# Patient Record
Sex: Male | Born: 1955 | Race: White | Hispanic: No | Smoking: Current some day smoker
Health system: Southern US, Community
[De-identification: ages and names within clinical notes are randomized; demographics above are authoritative.]

## PROBLEM LIST (undated history)

## (undated) ENCOUNTER — Encounter: Attending: Urology | Primary: Urology

## (undated) ENCOUNTER — Ambulatory Visit
Attending: Student in an Organized Health Care Education/Training Program | Primary: Student in an Organized Health Care Education/Training Program

## (undated) ENCOUNTER — Telehealth: Attending: Clinical | Primary: Clinical

## (undated) ENCOUNTER — Encounter

## (undated) ENCOUNTER — Ambulatory Visit

## (undated) ENCOUNTER — Encounter: Attending: Adult Health | Primary: Adult Health

## (undated) ENCOUNTER — Ambulatory Visit: Payer: MEDICARE

## (undated) ENCOUNTER — Telehealth

## (undated) ENCOUNTER — Telehealth: Attending: Addiction (Substance Use Disorder) | Primary: Addiction (Substance Use Disorder)

## (undated) ENCOUNTER — Telehealth
Attending: Student in an Organized Health Care Education/Training Program | Primary: Student in an Organized Health Care Education/Training Program

## (undated) ENCOUNTER — Ambulatory Visit: Payer: MEDICARE | Attending: Addiction (Substance Use Disorder) | Primary: Addiction (Substance Use Disorder)

## (undated) ENCOUNTER — Non-Acute Institutional Stay: Payer: MEDICARE | Attending: Internal Medicine | Primary: Internal Medicine

## (undated) ENCOUNTER — Telehealth: Attending: Urology | Primary: Urology

## (undated) ENCOUNTER — Encounter: Attending: Anesthesiology | Primary: Anesthesiology

## (undated) ENCOUNTER — Telehealth: Attending: Family Medicine | Primary: Family Medicine

## (undated) ENCOUNTER — Encounter: Payer: MEDICARE | Attending: Urology | Primary: Urology

## (undated) ENCOUNTER — Ambulatory Visit: Payer: PRIVATE HEALTH INSURANCE | Attending: Adult Health | Primary: Adult Health

## (undated) ENCOUNTER — Telehealth: Attending: Pharmacist | Primary: Pharmacist

## (undated) ENCOUNTER — Ambulatory Visit: Attending: Orthopaedic Trauma | Primary: Orthopaedic Trauma

## (undated) ENCOUNTER — Non-Acute Institutional Stay: Payer: MEDICARE

## (undated) ENCOUNTER — Telehealth: Attending: Sports Medicine | Primary: Sports Medicine

## (undated) ENCOUNTER — Inpatient Hospital Stay

## (undated) ENCOUNTER — Encounter: Attending: Clinical | Primary: Clinical

## (undated) ENCOUNTER — Ambulatory Visit: Attending: Urology | Primary: Urology

## (undated) ENCOUNTER — Ambulatory Visit: Attending: Clinical | Primary: Clinical

## (undated) ENCOUNTER — Encounter
Payer: MEDICARE | Attending: Student in an Organized Health Care Education/Training Program | Primary: Student in an Organized Health Care Education/Training Program

## (undated) ENCOUNTER — Encounter: Payer: MEDICARE | Attending: Addiction (Substance Use Disorder) | Primary: Addiction (Substance Use Disorder)

## (undated) ENCOUNTER — Encounter: Attending: Family Medicine | Primary: Family Medicine

## (undated) ENCOUNTER — Encounter: Attending: Sports Medicine | Primary: Sports Medicine

## (undated) DIAGNOSIS — F329 Major depressive disorder, single episode, unspecified: Secondary | ICD-10-CM

## (undated) DIAGNOSIS — E785 Hyperlipidemia, unspecified: Secondary | ICD-10-CM

## (undated) DIAGNOSIS — M869 Osteomyelitis, unspecified: Secondary | ICD-10-CM

## (undated) DIAGNOSIS — I700 Atherosclerosis of aorta: Secondary | ICD-10-CM

## (undated) DIAGNOSIS — I2510 Atherosclerotic heart disease of native coronary artery without angina pectoris: Secondary | ICD-10-CM

## (undated) DIAGNOSIS — R339 Retention of urine, unspecified: Secondary | ICD-10-CM

## (undated) DIAGNOSIS — R161 Splenomegaly, not elsewhere classified: Secondary | ICD-10-CM

## (undated) DIAGNOSIS — I10 Essential (primary) hypertension: Secondary | ICD-10-CM

## (undated) DIAGNOSIS — J449 Chronic obstructive pulmonary disease, unspecified: Secondary | ICD-10-CM

## (undated) DIAGNOSIS — B1920 Unspecified viral hepatitis C without hepatic coma: Secondary | ICD-10-CM

## (undated) DIAGNOSIS — F32A Depression, unspecified: Secondary | ICD-10-CM

## (undated) DIAGNOSIS — K279 Peptic ulcer, site unspecified, unspecified as acute or chronic, without hemorrhage or perforation: Secondary | ICD-10-CM

## (undated) DIAGNOSIS — D649 Anemia, unspecified: Secondary | ICD-10-CM

## (undated) DIAGNOSIS — I5042 Chronic combined systolic (congestive) and diastolic (congestive) heart failure: Secondary | ICD-10-CM

## (undated) HISTORY — PX: ANKLE SURGERY: SHX546

## (undated) HISTORY — PX: TEE WITHOUT CARDIOVERSION: SHX5443

## (undated) MED ORDER — ASPIRIN 325 MG TABLET: Freq: Every day | ORAL | 0 days

## (undated) MED ORDER — TAMSULOSIN 0.4 MG CAPSULE: 0 mg | capsule | Freq: Every day | 2 refills | 90 days

## (undated) MED ORDER — ATROVENT HFA 17 MCG/ACTUATION AEROSOL INHALER
0 days
Start: ? — End: 2021-01-24

---

## 2004-11-05 HISTORY — PX: CORONARY ANGIOPLASTY: SHX604

## 2005-11-19 ENCOUNTER — Ambulatory Visit: Payer: Self-pay | Admitting: Internal Medicine

## 2007-01-04 ENCOUNTER — Emergency Department: Payer: Self-pay | Admitting: Emergency Medicine

## 2008-03-08 ENCOUNTER — Ambulatory Visit: Payer: Self-pay | Admitting: General Practice

## 2011-11-06 HISTORY — PX: ANKLE SURGERY: SHX546

## 2012-09-09 ENCOUNTER — Ambulatory Visit: Payer: Self-pay | Admitting: Internal Medicine

## 2014-01-21 ENCOUNTER — Emergency Department: Payer: Self-pay | Admitting: Internal Medicine

## 2014-01-21 LAB — CBC
HCT: 46.8 % (ref 40.0–52.0)
HGB: 15.8 g/dL (ref 13.0–18.0)
MCH: 32.5 pg (ref 26.0–34.0)
MCHC: 33.8 g/dL (ref 32.0–36.0)
MCV: 96 fL (ref 80–100)
Platelet: 288 10*3/uL (ref 150–440)
RBC: 4.87 10*6/uL (ref 4.40–5.90)
RDW: 13 % (ref 11.5–14.5)
WBC: 10.7 10*3/uL — ABNORMAL HIGH (ref 3.8–10.6)

## 2014-01-21 LAB — BASIC METABOLIC PANEL
ANION GAP: 4 — AB (ref 7–16)
BUN: 13 mg/dL (ref 7–18)
CO2: 26 mmol/L (ref 21–32)
Calcium, Total: 9 mg/dL (ref 8.5–10.1)
Chloride: 107 mmol/L (ref 98–107)
Creatinine: 1.01 mg/dL (ref 0.60–1.30)
EGFR (African American): >60
GLUCOSE: 110 mg/dL — AB (ref 65–99)
Osmolality: 275 (ref 275–301)
POTASSIUM: 3.7 mmol/L (ref 3.5–5.1)
Sodium: 137 mmol/L (ref 136–145)

## 2015-03-03 ENCOUNTER — Emergency Department
Admit: 2015-03-03 | Disposition: A | Discharge status: Home / Self Care | Payer: Self-pay | Admitting: Emergency Medicine

## 2015-03-07 LAB — BETA STREP CULTURE(ARMC)

## 2015-10-31 ENCOUNTER — Emergency Department
Admission: EM | Admit: 2015-10-31 | Discharge: 2015-10-31 | Disposition: A | Discharge status: Home / Self Care | Payer: Self-pay | Attending: Emergency Medicine | Admitting: Emergency Medicine

## 2015-10-31 ENCOUNTER — Emergency Department: Admission: EM | Admit: 2015-10-31 | Discharge: 2015-10-31 | Discharge status: Absent without leave | Payer: Self-pay

## 2015-10-31 ENCOUNTER — Emergency Department: Payer: Self-pay

## 2015-10-31 DIAGNOSIS — F17200 Nicotine dependence, unspecified, uncomplicated: Secondary | ICD-10-CM | POA: Insufficient documentation

## 2015-10-31 DIAGNOSIS — I10 Essential (primary) hypertension: Secondary | ICD-10-CM | POA: Insufficient documentation

## 2015-10-31 DIAGNOSIS — L03115 Cellulitis of right lower limb: Secondary | ICD-10-CM | POA: Insufficient documentation

## 2015-10-31 HISTORY — DX: Essential (primary) hypertension: I10

## 2015-10-31 MED ORDER — SULFAMETHOXAZOLE-TRIMETHOPRIM 800-160 MG PO TABS: 1.0000 | ORAL_TABLET | Freq: Two times a day (BID) | ORAL | Status: DC

## 2015-10-31 MED ORDER — OXYCODONE-ACETAMINOPHEN 7.5-325 MG PO TABS: 1.0000 | ORAL_TABLET | ORAL | Status: DC | PRN

## 2015-10-31 NOTE — ED Notes (Addendum)
Pt arrives POV c/o right ankle pain and swelling. Pt hx of metal plates in right ankle per patient and he states that it is normally swollen mildly. Pt states that since Friday his right ankle has been more red and swollen than his baseline. Palpable pulse moderate. Edema 1+. Pt alert and oriented X4, active, cooperative, pt in NAD. RR even and unlabored, color WNL.  Pt ambulatory. No breaks in skin.

## 2015-10-31 NOTE — Discharge Instructions (Signed)
Cellulitis °Cellulitis is an infection of the skin and the tissue under the skin. The infected area is usually red and tender. This happens most often in the arms and lower legs. °HOME CARE  °· Take your antibiotic medicine as told. Finish the medicine even if you start to feel better. °· Keep the infected arm or leg raised (elevated). °· Put a warm cloth on the area up to 4 times per day. °· Only take medicines as told by your doctor. °· Keep all doctor visits as told. °GET HELP IF: °· You see red streaks on the skin coming from the infected area. °· Your red area gets bigger or turns a dark color. °· Your bone or joint under the infected area is painful after the skin heals. °· Your infection comes back in the same area or different area. °· You have a puffy (swollen) bump in the infected area. °· You have new symptoms. °· You have a fever. °GET HELP RIGHT AWAY IF:  °· You feel very sleepy. °· You throw up (vomit) or have watery poop (diarrhea). °· You feel sick and have muscle aches and pains. °  °This information is not intended to replace advice given to you by your health care provider. Make sure you discuss any questions you have with your health care provider. °  °Document Released: 04/09/2008 Document Revised: 07/13/2015 Document Reviewed: 01/07/2012 °Elsevier Interactive Patient Education ©2016 Elsevier [employer] ° °

## 2015-10-31 NOTE — ED Notes (Addendum)
Pt in w/ complaints of pain and swelling to right ankle since Friday; pt reports surgery and metal hardware placed in 2013 to right ankle.  Swelling, redness, warmth to right ankle.  Pt in no immediate distress at this time.

## 2015-10-31 NOTE — ED Provider Notes (Signed)
[facility] Emergency Department Provider Note  ____________________________________________  Time seen: Approximately 2:24 PM  I have reviewed the triage vital signs and the nursing notes.   HISTORY  Chief Complaint Ankle Pain    HPI Wayne Burns is a 59 y.o. male patient with a right ankle pain and swollen for 3 days. Patient stated he has internal fixation in the ankle always has a mild amount of edema. Pacer became concerned he knows increased redness around the ankle. He denies any provocative incident to increased swelling. Patient is rating his pain as 8/10 describe the sharp. No palliative measures taken for this complaint.   Past Medical History  Diagnosis Date  . Hypertension     There are no active problems to display for this patient.   Past Surgical History  Procedure Laterality Date  . Ankle surgery      No current outpatient prescriptions on file.  Allergies Review of patient's allergies indicates no known allergies.  No family history on file.  Social History Social History  Substance Use Topics  . Smoking status: Current Every Day Smoker  . Smokeless tobacco: None  . Alcohol Use: No    Review of Systems Constitutional: No fever/chills Eyes: No visual changes. ENT: No sore throat. Cardiovascular: Denies chest pain. Respiratory: Denies shortness of breath. Gastrointestinal: No abdominal pain.  No nausea, no vomiting.  No diarrhea.  No constipation. Genitourinary: Negative for dysuria. Musculoskeletal: Right ankle pain Skin: Negative for rash. Neurological: Negative for headaches, focal weakness or numbness. Endocrine:Hypertension 10-point ROS otherwise negative.  ____________________________________________   PHYSICAL EXAM:  VITAL SIGNS: ED Triage Vitals  Enc Vitals Group     BP 10/31/15 1407 153/87 mmHg     Pulse Rate 10/31/15 1407 87     Resp 10/31/15 1407 20     Temp 10/31/15 1407 97.9 F (36.6 C)   Temp Source 10/31/15 1407 Oral     SpO2 10/31/15 1407 98 %     Weight 10/31/15 1407 200 lb (90.719 kg)     Height 10/31/15 1407 5\' 9"  (1.753 m)     Head Cir --      Peak Flow --      Pain Score 10/31/15 1408 8     Pain Loc --      Pain Edu? --      Excl. in [place]? --     Constitutional: Alert and oriented. Well appearing and in no acute distress. Eyes: Conjunctivae are normal. PERRL. EOMI. Head: Atraumatic. Nose: No congestion/rhinnorhea. Mouth/Throat: Mucous membranes are moist.  Oropharynx non-erythematous. Neck: No stridor.  No cervical spine tenderness to palpation. Hematological/Lymphatic/Immunilogical: No cervical lymphadenopathy. Cardiovascular: Normal rate, regular rhythm. Grossly normal heart sounds.  Good peripheral circulation. Respiratory: Normal respiratory effort.  No retractions. Lungs CTAB. Gastrointestinal: Soft and nontender. No distention. No abdominal bruits. No CVA tenderness. Musculoskeletal: No lower extremity tenderness nor edema.  No joint effusions. Neurologic:  Normal speech and language. No gross focal neurologic deficits are appreciated. No gait instability. Skin:  Skin is edematous and erythematous. Edema measures 62 cm circumference. Psychiatric: Mood and affect are normal. Speech and behavior are normal.  ____________________________________________   LABS (all labs ordered are listed, but only abnormal results are displayed)  Labs Reviewed - No data to display ____________________________________________  EKG   ____________________________________________  RADIOLOGY no acute findings on x-ray except for soft tissue edema. ____________________________________________   PROCEDURES  Procedure(s) performed: None  Critical Care performed: No  ____________________________________________   INITIAL IMPRESSION /  ASSESSMENT AND PLAN / ED COURSE  Pertinent labs & imaging results that were available during my care of the patient were reviewed  by me and considered in my medical decision making (see chart for details). Cellulitis right ankle. Discussed x-ray findings with patient. Patient given a prescription for Bactrim and Percocets. Patient given discharge home care instructions. Patient advised follow-up in 3 days with [facility] clinic. Patient given a work excuse for 3 days.  ____________________________________________   FINAL CLINICAL IMPRESSION(S) / ED DIAGNOSES  Final diagnoses:  None      Sable Feil, PA-C 10/31/15 1513  Lavonia Drafts, MD 11/01/15 [phone]

## 2015-12-17 ENCOUNTER — Encounter: Payer: Self-pay | Admitting: Emergency Medicine

## 2015-12-17 ENCOUNTER — Emergency Department
Admission: EM | Admit: 2015-12-17 | Discharge: 2015-12-17 | Disposition: A | Discharge status: Home / Self Care | Payer: Self-pay | Attending: Emergency Medicine | Admitting: Emergency Medicine

## 2015-12-17 ENCOUNTER — Emergency Department: Payer: Self-pay

## 2015-12-17 DIAGNOSIS — Z792 Long term (current) use of antibiotics: Secondary | ICD-10-CM | POA: Insufficient documentation

## 2015-12-17 DIAGNOSIS — J449 Chronic obstructive pulmonary disease, unspecified: Secondary | ICD-10-CM

## 2015-12-17 DIAGNOSIS — J439 Emphysema, unspecified: Secondary | ICD-10-CM | POA: Insufficient documentation

## 2015-12-17 DIAGNOSIS — I10 Essential (primary) hypertension: Secondary | ICD-10-CM | POA: Insufficient documentation

## 2015-12-17 DIAGNOSIS — F17200 Nicotine dependence, unspecified, uncomplicated: Secondary | ICD-10-CM | POA: Insufficient documentation

## 2015-12-17 MED ORDER — IPRATROPIUM-ALBUTEROL 0.5-2.5 (3) MG/3ML IN SOLN
3.0000 mL | Freq: Once | RESPIRATORY_TRACT | Status: AC
  Administered 2015-12-17: 3 mL via RESPIRATORY_TRACT
  Filled 2015-12-17: qty 3

## 2015-12-17 MED ORDER — ALBUTEROL SULFATE (2.5 MG/3ML) 0.083% IN NEBU: 2.5000 mg | INHALATION_SOLUTION | Freq: Four times a day (QID) | RESPIRATORY_TRACT | Status: DC | PRN

## 2015-12-17 MED ORDER — ALBUTEROL SULFATE HFA 108 (90 BASE) MCG/ACT IN AERS: 2.0000 | INHALATION_SPRAY | Freq: Four times a day (QID) | RESPIRATORY_TRACT | Status: DC | PRN

## 2015-12-17 NOTE — ED Notes (Signed)
Pt reports he is a smoker but has been unable to smoke for the past four days due to feeling short of breath.

## 2015-12-17 NOTE — ED Notes (Signed)
States began feeling bad on Wednesday.    Denies fevers.  Describes generally not feeling well.  Feeling like can't catch breath, cough, wheezing.

## 2015-12-17 NOTE — ED Provider Notes (Signed)
[facility] Emergency Department Provider Note ____________________________________________  Time seen: 1246  I have reviewed the triage vital signs and the nursing notes.  HISTORY  Chief Complaint  Cough  HPI Wayne Burns is a 60 y.o. male presents to the ED for evaluation of symptoms that include generally feeling bad and being significantly short of breath with associated cough and wheezing. He describes the symptoms began on Wednesday, 4 days prior to arrival. He describes his symptoms have been significant enough to force him to not smoke since onset.He denies any intermittent fevers but does admit to decreased appetite. He describes the cough has been nonproductive, and has not responded to the Mucinex he's been dosing over-the-counter. He denies any significant chest pain or abdominal pain, or dyspnea on exertion.  Past Medical History  Diagnosis Date  . Hypertension    There are no active problems to display for this patient.  Past Surgical History  Procedure Laterality Date  . Ankle surgery      Current Outpatient Rx  Name  Route  Sig  Dispense  Refill  . albuterol (PROVENTIL HFA;VENTOLIN HFA) 108 (90 Base) MCG/ACT inhaler   Inhalation   Inhale 2 puffs into the lungs every 6 (six) hours as needed for wheezing or shortness of breath.   1 Inhaler   0   . albuterol (PROVENTIL) (2.5 MG/3ML) 0.083% nebulizer solution   Nebulization   Take 3 mLs (2.5 mg total) by nebulization every 6 (six) hours as needed for wheezing or shortness of breath.   25 vial   0   . oxyCODONE-acetaminophen (PERCOCET) 7.5-325 MG tablet   Oral   Take 1 tablet by mouth every 4 (four) hours as needed for severe pain.   20 tablet   0   . sulfamethoxazole-trimethoprim (BACTRIM DS,SEPTRA DS) 800-160 MG tablet   Oral   Take 1 tablet by mouth 2 (two) times daily.   20 tablet   0    Allergies Review of patient's allergies indicates no known allergies.  No family history  on file.  Social History Social History  Substance Use Topics  . Smoking status: Current Every Day Smoker  . Smokeless tobacco: None  . Alcohol Use: No   Review of Systems  Constitutional: Negative for fever. Eyes: Negative for visual changes. ENT: Negative for sore throat. Cardiovascular: Negative for chest pain. Respiratory: Positive for shortness of breath. Reports cough as above Gastrointestinal: Negative for abdominal pain, vomiting and diarrhea. Genitourinary: Negative for dysuria. Musculoskeletal: Negative for back pain. Skin: Negative for rash. Neurological: Negative for headaches, focal weakness or numbness. ____________________________________________  PHYSICAL EXAM:  VITAL SIGNS: ED Triage Vitals  Enc Vitals Group     BP 12/17/15 1125 140/109 mmHg     Pulse Rate 12/17/15 1125 96     Resp 12/17/15 1125 22     Temp 12/17/15 1125 97.9 F (36.6 C)     Temp Source 12/17/15 1125 Oral     SpO2 12/17/15 1125 95 %     Weight 12/17/15 1125 200 lb (90.719 kg)     Height 12/17/15 1125 5\' 9"  (1.753 m)     Head Cir --      Peak Flow --      Pain Score 12/17/15 1126 0     Pain Loc --      Pain Edu? --      Excl. in [place]? --    Constitutional: Alert and oriented. Well appearing and in no distress. Head: Normocephalic  and atraumatic.      Eyes: Conjunctivae are normal. PERRL. Normal extraocular movements      Ears: Canals clear. TMs intact bilaterally.   Nose: No congestion/rhinorrhea.   Mouth/Throat: Mucous membranes are moist.   Neck: Supple. No thyromegaly. Hematological/Lymphatic/Immunological: No cervical lymphadenopathy. Cardiovascular: Normal rate, regular rhythm.  Respiratory: Normal respiratory effort. No wheezes/rales/rhonchi. Gastrointestinal: Soft and nontender. No distention. Musculoskeletal: Nontender with normal range of motion in all extremities.  Neurologic:  Normal gait without ataxia. Normal speech and language. No gross focal neurologic  deficits are appreciated. Skin:  Skin is warm, dry and intact. No rash noted. Psychiatric: Mood and affect are normal. Patient exhibits appropriate insight and judgment. ____________________________________________   RADIOLOGY CXR IMPRESSION: COPD/emphysema without evidence of acute cardiopulmonary disease.  I, Moani Weipert, Dannielle Karvonen, personally viewed and evaluated these images (plain radiographs) as part of my medical decision making, as well as reviewing the written report by the radiologist.  ____________________________________________  PROCEDURES  DuoNeb x 1 ____________________________________________  INITIAL IMPRESSION / ASSESSMENT AND PLAN / ED COURSE  Patient reports improvement with symptoms following DuoNeb treatment. He is advised of the radiology results and is prescribed albuterol to dose as directed. He is encouraged to follow-up with his primary care provider for ongoing symptom management. He is also encouraged to continue smoking cessation since he now understands that that is a significant underlying risk factor for development of COPD. He is provided with information to follow-up with a local community clinics as well as information on assistance with medications. ____________________________________________  FINAL CLINICAL IMPRESSION(S) / ED DIAGNOSES  Final diagnoses:  Pulmonary emphysema, unspecified emphysema type ([place])  COPD, mild ([place])      Melvenia Needles, PA-C 12/20/15 0021  Eula Listen, MD 12/21/15 1536

## 2015-12-17 NOTE — Discharge Instructions (Signed)
Chronic Obstructive Pulmonary Disease Chronic obstructive pulmonary disease (COPD) is a common lung condition in which airflow from the lungs is limited. COPD is a general term that can be used to describe many different lung problems that limit airflow, including both chronic bronchitis and emphysema. If you have COPD, your lung function will probably never return to normal, but there are measures you can take to improve lung function and make yourself feel better. CAUSES   Smoking (common).  Exposure to secondhand smoke.  Genetic problems.  Chronic inflammatory lung diseases or recurrent infections. SYMPTOMS  Shortness of breath, especially with physical activity.  Deep, persistent (chronic) cough with a large amount of thick mucus.  Wheezing.  Rapid breaths (tachypnea).  Gray or bluish discoloration (cyanosis) of the skin, especially in your fingers, toes, or lips.  Fatigue.  Weight loss.  Frequent infections or episodes when breathing symptoms become much worse (exacerbations).  Chest tightness. DIAGNOSIS Your health care provider will take a medical history and perform a physical examination to diagnose COPD. Additional tests for COPD may include:  Lung (pulmonary) function tests.  Chest X-ray.  CT scan.  Blood tests. TREATMENT  Treatment for COPD may include:  Inhaler and nebulizer medicines. These help manage the symptoms of COPD and make your breathing more comfortable.  Supplemental oxygen. Supplemental oxygen is only helpful if you have a low oxygen level in your blood.  Exercise and physical activity. These are beneficial for nearly all people with COPD.  Lung surgery or transplant.  Nutrition therapy to gain weight, if you are underweight.  Pulmonary rehabilitation. This may involve working with a team of health care providers and specialists, such as respiratory, occupational, and physical therapists. HOME CARE INSTRUCTIONS  Take all medicines  (inhaled or pills) as directed by your health care provider.  Avoid over-the-counter medicines or cough syrups that dry up your airway (such as antihistamines) and slow down the elimination of secretions unless instructed otherwise by your health care provider.  If you are a smoker, the most important thing that you can do is stop smoking. Continuing to smoke will cause further lung damage and breathing trouble. Ask your health care provider for help with quitting smoking. He or she can direct you to community resources or hospitals that provide support.  Avoid exposure to irritants such as smoke, chemicals, and fumes that aggravate your breathing.  Use oxygen therapy and pulmonary rehabilitation if directed by your health care provider. If you require home oxygen therapy, ask your health care provider whether you should purchase a pulse oximeter to measure your oxygen level at home.  Avoid contact with individuals who have a contagious illness.  Avoid extreme temperature and humidity changes.  Eat healthy foods. Eating smaller, more frequent meals and resting before meals may help you maintain your strength.  Stay active, but balance activity with periods of rest. Exercise and physical activity will help you maintain your ability to do things you want to do.  Preventing infection and hospitalization is very important when you have COPD. Make sure to receive all the vaccines your health care provider recommends, especially the pneumococcal and influenza vaccines. Ask your health care provider whether you need a pneumonia vaccine.  Learn and use relaxation techniques to manage stress.  Learn and use controlled breathing techniques as directed by your health care provider. Controlled breathing techniques include:  Pursed lip breathing. Start by breathing in (inhaling) through your nose for 1 second. Then, purse your lips as if you were  going to whistle and breathe out (exhale) through the  pursed lips for 2 seconds.  Diaphragmatic breathing. Start by putting one hand on your abdomen just above your waist. Inhale slowly through your nose. The hand on your abdomen should move out. Then purse your lips and exhale slowly. You should be able to feel the hand on your abdomen moving in as you exhale.  Learn and use controlled coughing to clear mucus from your lungs. Controlled coughing is a series of short, progressive coughs. The steps of controlled coughing are: 1. Lean your head slightly forward. 2. Breathe in deeply using diaphragmatic breathing. 3. Try to hold your breath for 3 seconds. 4. Keep your mouth slightly open while coughing twice. 5. Spit any mucus out into a tissue. 6. Rest and repeat the steps once or twice as needed. SEEK MEDICAL CARE IF:  You are coughing up more mucus than usual.  There is a change in the color or thickness of your mucus.  Your breathing is more labored than usual.  Your breathing is faster than usual. SEEK IMMEDIATE MEDICAL CARE IF:  You have shortness of breath while you are resting.  You have shortness of breath that prevents you from:  Being able to talk.  Performing your usual physical activities.  You have chest pain lasting longer than 5 minutes.  Your skin color is more cyanotic than usual.  You measure low oxygen saturations for longer than 5 minutes with a pulse oximeter. MAKE SURE YOU:  Understand these instructions.  Will watch your condition.  Will get help right away if you are not doing well or get worse.   This information is not intended to replace advice given to you by your health care provider. Make sure you discuss any questions you have with your health care provider.   Document Released: 08/01/2005 Document Revised: 11/12/2014 Document Reviewed: 06/18/2013 Elsevier Interactive Patient Education [employer].  Your exam and chest x-ray have confirmed COPD and emphysema as your diagnosis. You  should dose the prescription inhaler as directed. Follow-up with a local clinic for ongoing management of your COPD and high blood pressure. Return to the ED as needed for worsening symptoms.

## 2016-03-26 ENCOUNTER — Encounter: Payer: Self-pay | Admitting: Emergency Medicine

## 2016-03-26 ENCOUNTER — Emergency Department
Admission: EM | Admit: 2016-03-26 | Discharge: 2016-03-26 | Disposition: A | Discharge status: Home / Self Care | Payer: Self-pay | Attending: Emergency Medicine | Admitting: Emergency Medicine

## 2016-03-26 DIAGNOSIS — L03115 Cellulitis of right lower limb: Secondary | ICD-10-CM | POA: Insufficient documentation

## 2016-03-26 DIAGNOSIS — F17200 Nicotine dependence, unspecified, uncomplicated: Secondary | ICD-10-CM | POA: Insufficient documentation

## 2016-03-26 DIAGNOSIS — L03119 Cellulitis of unspecified part of limb: Secondary | ICD-10-CM

## 2016-03-26 DIAGNOSIS — I10 Essential (primary) hypertension: Secondary | ICD-10-CM | POA: Insufficient documentation

## 2016-03-26 LAB — COMPREHENSIVE METABOLIC PANEL
ALBUMIN: 3.5 g/dL (ref 3.5–5.0)
ALK PHOS: 111 U/L (ref 38–126)
ALT: 65 U/L — AB (ref 17–63)
ANION GAP: 6 (ref 5–15)
AST: 44 U/L — AB (ref 15–41)
BILIRUBIN TOTAL: 0.8 mg/dL (ref 0.3–1.2)
BUN: 12 mg/dL (ref 6–20)
CALCIUM: 9.2 mg/dL (ref 8.9–10.3)
CO2: 25 mmol/L (ref 22–32)
CREATININE: 0.87 mg/dL (ref 0.61–1.24)
Chloride: 106 mmol/L (ref 101–111)
GFR calc Af Amer: >60 mL/min (ref >60)
GFR calc non Af Amer: >60 mL/min (ref >60)
GLUCOSE: 114 mg/dL — AB (ref 65–99)
Potassium: 4.1 mmol/L (ref 3.5–5.1)
Sodium: 137 mmol/L (ref 135–145)
TOTAL PROTEIN: 7.9 g/dL (ref 6.5–8.1)

## 2016-03-26 LAB — CBC
HEMATOCRIT: 46.9 % (ref 40.0–52.0)
HEMOGLOBIN: 15.9 g/dL (ref 13.0–18.0)
MCH: 32.1 pg (ref 26.0–34.0)
MCHC: 33.8 g/dL (ref 32.0–36.0)
MCV: 95 fL (ref 80.0–100.0)
Platelets: 200 10*3/uL (ref 150–440)
RBC: 4.93 MIL/uL (ref 4.40–5.90)
RDW: 14 % (ref 11.5–14.5)
WBC: 10.8 10*3/uL — ABNORMAL HIGH (ref 3.8–10.6)

## 2016-03-26 MED ORDER — OXYCODONE-ACETAMINOPHEN 5-325 MG PO TABS: 1.0000 | ORAL_TABLET | ORAL | Status: DC | PRN

## 2016-03-26 MED ORDER — CEPHALEXIN 500 MG PO CAPS: 500.0000 mg | ORAL_CAPSULE | Freq: Four times a day (QID) | ORAL | Status: AC

## 2016-03-26 NOTE — Discharge Instructions (Signed)

## 2016-03-26 NOTE — ED Notes (Signed)
See down time document

## 2016-03-26 NOTE — ED Notes (Signed)
States he developed increased swelling and redness to right lower leg/ankle area couple of days ago.states he did a lot of walking for his job  And denies new or recent injury

## 2016-03-26 NOTE — ED Provider Notes (Signed)
[facility] Emergency Department Provider Note  ____________________________________________  Time seen: Approximately 2:26 PM  I have reviewed the triage vital signs and the nursing notes.   HISTORY  Chief Complaint Recurrent Skin Infections    HPI Wayne Burns is a 60 y.o. male presents for evaluation of increased swelling and redness to right lower leg and ankle times couple days. States he does a lot of walking for a job denies any new or decent recent injury. Patient does have a past medical history of hardware in his ankle and foot. Desires to have it removed. States this is not the first time today said inflammation and swelling secondary to prolonged standing.   Past Medical History  Diagnosis Date  . Hypertension     There are no active problems to display for this patient.   Past Surgical History  Procedure Laterality Date  . Ankle surgery      Current Outpatient Rx  Name  Route  Sig  Dispense  Refill  . cephALEXin (KEFLEX) 500 MG capsule   Oral   Take 1 capsule (500 mg total) by mouth 4 (four) times daily.   40 capsule   0   . oxyCODONE-acetaminophen (ROXICET) 5-325 MG tablet   Oral   Take 1-2 tablets by mouth every 4 (four) hours as needed for severe pain.   15 tablet   0     Allergies Review of patient's allergies indicates no known allergies.  No family history on file.  Social History Social History  Substance Use Topics  . Smoking status: Current Every Day Smoker  . Smokeless tobacco: None  . Alcohol Use: No    Review of Systems Constitutional: No fever/chills Cardiovascular: Denies chest pain. Respiratory: Denies shortness of breath. Musculoskeletal: Positive for right ankle foot pain. Skin: Right ankle. Positive edema with erythema and warmth.. Neurological: Negative for headaches, focal weakness or numbness.  10-point ROS otherwise negative.  ____________________________________________   PHYSICAL  EXAM:  VITAL SIGNS: ED Triage Vitals  Enc Vitals Group     BP 03/26/16 1306 179/101 mmHg     Pulse Rate 03/26/16 1306 93     Resp 03/26/16 1306 20     Temp 03/26/16 1306 98.5 F (36.9 C)     Temp Source 03/26/16 1306 Oral     SpO2 03/26/16 1306 97 %     Weight 03/26/16 1306 190 lb (86.183 kg)     Height 03/26/16 1306 5\' 9"  (1.753 m)     Head Cir --      Peak Flow --      Pain Score 03/26/16 1307 10     Pain Loc --      Pain Edu? --      Excl. in [place]? --     Constitutional: Alert and oriented. Well appearing and in no acute distress.  Cardiovascular: Normal rate, regular rhythm. Grossly normal heart sounds.  Good peripheral circulation. Respiratory: Normal respiratory effort.  No retractions. Lungs CTAB. Musculoskeletal: Right lower extremity with edema, erythema and effusion. Positive warmth and redness. Neurologic:  Normal speech and language. No gross focal neurologic deficits are appreciated. No gait instability. Skin:  Skin is warm, dry and intact. No rash noted. Psychiatric: Mood and affect are normal. Speech and behavior are normal.  ____________________________________________   LABS (all labs ordered are listed, but only abnormal results are displayed)  Labs Reviewed  CBC - Abnormal; Notable for the following:    WBC 10.8 (*)    All  other components within normal limits  COMPREHENSIVE METABOLIC PANEL - Abnormal; Notable for the following:    Glucose, Bld 114 (*)    AST 44 (*)    ALT 65 (*)    All other components within normal limits   ____________________________________________    PROCEDURES  Procedure(s) performed: None  Critical Care performed: No  ____________________________________________   INITIAL IMPRESSION / ASSESSMENT AND PLAN / ED COURSE  Pertinent labs & imaging results that were available during my care of the patient were reviewed by me and considered in my medical decision making (see chart for details).  Acute exacerbation  cellulitis right lower extremity. Rx given for Keflex 500 mg 4 times a day and oxycodone for pain. Patient follow-up with PCP. Referral was given to orthopedics on call she voices no other emergency medical complaints this time.  ____________________________________________   FINAL CLINICAL IMPRESSION(S) / ED DIAGNOSES  Final diagnoses:  Cellulitis of lower extremity, unspecified laterality     This chart was dictated using voice recognition software/Dragon. Despite best efforts to proofread, errors can occur which can change the meaning. Any change was purely unintentional.   Arlyss Repress, PA-C 03/26/16 1524  Lisa Roca, MD 03/26/16 1606

## 2016-04-28 ENCOUNTER — Emergency Department
Admission: EM | Admit: 2016-04-28 | Discharge: 2016-04-28 | Disposition: A | Discharge status: Home / Self Care | Payer: Self-pay | Attending: Emergency Medicine | Admitting: Emergency Medicine

## 2016-04-28 ENCOUNTER — Encounter: Payer: Self-pay | Admitting: Emergency Medicine

## 2016-04-28 DIAGNOSIS — I10 Essential (primary) hypertension: Secondary | ICD-10-CM | POA: Insufficient documentation

## 2016-04-28 DIAGNOSIS — F17210 Nicotine dependence, cigarettes, uncomplicated: Secondary | ICD-10-CM | POA: Insufficient documentation

## 2016-04-28 DIAGNOSIS — L03115 Cellulitis of right lower limb: Secondary | ICD-10-CM | POA: Insufficient documentation

## 2016-04-28 MED ORDER — SULFAMETHOXAZOLE-TRIMETHOPRIM 800-160 MG PO TABS: 1.0000 | ORAL_TABLET | Freq: Two times a day (BID) | ORAL | Status: DC

## 2016-04-28 MED ORDER — HYDROCODONE-ACETAMINOPHEN 5-325 MG PO TABS: 1.0000 | ORAL_TABLET | ORAL | Status: DC | PRN

## 2016-04-28 MED ORDER — MELOXICAM 15 MG PO TABS: 15.0000 mg | ORAL_TABLET | Freq: Every day | ORAL | Status: DC

## 2016-04-28 NOTE — Discharge Instructions (Signed)

## 2016-04-28 NOTE — ED Notes (Addendum)
States has ankle pain x 2 days. States has history of fracture and pins that ankle 2012. Concerned he may have infection. States has had previous infections which he took po antibiotics for. States has had x ray 3 months ago and was OK and has not had new injury. Ankle does not appear reddened.

## 2016-04-28 NOTE — ED Notes (Signed)
Pt verbalized understanding of discharge instructions. NAD at this time. 

## 2016-04-28 NOTE — ED Provider Notes (Signed)
[facility] Emergency Department Provider Note ____________________________________________  Time seen: Approximately 5:16 PM  I have reviewed the triage vital signs and the nursing notes.   HISTORY  Chief Complaint Ankle Pain    HPI Wayne Burns is a 60 y.o. male who presents to the emergency department for evaluation of right ankle pain and swelling. He states that 2 days ago the ankle began to swell and become painful. He states that since his injury in 2012 that required ORIF, he has had multiple episodes of cellulitis. He states that he was examined several weeks ago, and started on some type of antibiotic, but does not feel that it completely took the infection away. He denies fevers, chills, nausea, vomiting, or recent injury. He has not taken anything to help with pain.  Past Medical History  Diagnosis Date  . Hypertension     There are no active problems to display for this patient.   Past Surgical History  Procedure Laterality Date  . Ankle surgery      Current Outpatient Rx  Name  Route  Sig  Dispense  Refill  . HYDROcodone-acetaminophen (NORCO/VICODIN) 5-325 MG tablet   Oral   Take 1 tablet by mouth every 4 (four) hours as needed for moderate pain.   20 tablet   0   . meloxicam (MOBIC) 15 MG tablet   Oral   Take 1 tablet (15 mg total) by mouth daily.   30 tablet   0   . sulfamethoxazole-trimethoprim (BACTRIM DS,SEPTRA DS) 800-160 MG tablet   Oral   Take 1 tablet by mouth 2 (two) times daily.   20 tablet   0     Allergies Review of patient's allergies indicates no known allergies.  No family history on file.  Social History Social History  Substance Use Topics  . Smoking status: Current Every Day Smoker -- 0.50 packs/day    Types: Cigarettes  . Smokeless tobacco: None  . Alcohol Use: No    Review of Systems Constitutional: No recent illness. Cardiovascular: Denies chest pain or palpitations. Respiratory: Denies  shortness of breath. Musculoskeletal: Pain in Right ankle Skin: Negative for rash, wound, lesion. Neurological: Negative for focal weakness or numbness.  ____________________________________________   PHYSICAL EXAM:  VITAL SIGNS: ED Triage Vitals  Enc Vitals Group     BP 04/28/16 1640 169/91 mmHg     Pulse Rate 04/28/16 1640 95     Resp 04/28/16 1640 20     Temp 04/28/16 1640 98.8 F (37.1 C)     Temp Source 04/28/16 1640 Oral     SpO2 04/28/16 1640 95 %     Weight 04/28/16 1640 190 lb (86.183 kg)     Height 04/28/16 1640 5\' 9"  (1.753 m)     Head Cir --      Peak Flow --      Pain Score 04/28/16 1642 8     Pain Loc --      Pain Edu? --      Excl. in [place]? --     Constitutional: Alert and oriented. Well appearing and in no acute distress. Eyes: Conjunctivae are normal. EOMI. Head: Atraumatic. Neck: No stridor.  Respiratory: Normal respiratory effort.   Musculoskeletal: Edematous, warm, mildly erythematous right ankle with limited range of motion secondary to pain. Neurologic:  Normal speech and language. No gross focal neurologic deficits are appreciated. Speech is normal. No gait instability. Skin:  Skin is warm, dry and intact. Atraumatic. Psychiatric: Mood and affect  are normal. Speech and behavior are normal.  ____________________________________________   LABS (all labs ordered are listed, but only abnormal results are displayed)  Labs Reviewed - No data to display ____________________________________________  RADIOLOGY  Not indicated ____________________________________________   PROCEDURES  Procedure(s) performed:  Ace wrap applied to the right ankle by ER tech. Neurovascularly intact post-application.   ____________________________________________   INITIAL IMPRESSION / ASSESSMENT AND PLAN / ED COURSE  Pertinent labs & imaging results that were available during my care of the patient were reviewed by me and considered in my medical decision making  (see chart for details).  Most recent chart reviewed. Patient was given a prescription for Keflex at his last visit. Today, he will be prescribed Bactrim and given Norco for pain. Patient does not have health insurance, and states that he will be unable to follow-up with orthopedics. He will be given information about [facility] and [facility]. He was advised to return to the emergency department for symptoms that are not improving over the next 48 hours or sooner if worse.  ____________________________________________   FINAL CLINICAL IMPRESSION(S) / ED DIAGNOSES  Final diagnoses:  Cellulitis of right lower extremity       Victorino Dike, FNP 04/28/16 [place], MD 04/29/16 [phone]

## 2016-06-30 ENCOUNTER — Emergency Department
Admission: EM | Admit: 2016-06-30 | Discharge: 2016-06-30 | Disposition: A | Discharge status: Home / Self Care | Payer: Self-pay | Attending: Emergency Medicine | Admitting: Emergency Medicine

## 2016-06-30 DIAGNOSIS — F17210 Nicotine dependence, cigarettes, uncomplicated: Secondary | ICD-10-CM | POA: Insufficient documentation

## 2016-06-30 DIAGNOSIS — I10 Essential (primary) hypertension: Secondary | ICD-10-CM | POA: Insufficient documentation

## 2016-06-30 DIAGNOSIS — L03115 Cellulitis of right lower limb: Secondary | ICD-10-CM | POA: Insufficient documentation

## 2016-06-30 DIAGNOSIS — Z791 Long term (current) use of non-steroidal anti-inflammatories (NSAID): Secondary | ICD-10-CM | POA: Insufficient documentation

## 2016-06-30 MED ORDER — SULFAMETHOXAZOLE-TRIMETHOPRIM 800-160 MG PO TABS
2.0000 | ORAL_TABLET | Freq: Once | ORAL | Status: AC
  Administered 2016-06-30: 2 via ORAL
  Filled 2016-06-30: qty 2

## 2016-06-30 MED ORDER — HYDROCODONE-ACETAMINOPHEN 5-325 MG PO TABS: 1.0000 | ORAL_TABLET | ORAL | 0 refills | Status: AC | PRN

## 2016-06-30 MED ORDER — SULFAMETHOXAZOLE-TRIMETHOPRIM 800-160 MG PO TABS: 2.0000 | ORAL_TABLET | Freq: Two times a day (BID) | ORAL | 0 refills | Status: DC

## 2016-06-30 NOTE — ED Provider Notes (Signed)
[facility] Emergency Department Provider Note   ____________________________________________   First MD Initiated Contact with Patient 06/30/16 1021     (approximate)  I have reviewed the triage vital signs and the nursing notes.   HISTORY  Chief Complaint Leg Swelling    HPI Wayne Burns is a 60 y.o. 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 He says that he has had multiple episodes of cellulitis to the right ankle and Bactrim has worked well for him. He denies having insurance and says that he has had difficulty following up with orthopedics but would like the number for orthopedics for follow-up at this time. He said that he has a history of a fibular fracture with screw in the right lower extremity. He also is saying that he does not want labs or imaging at this time and would just like the antibiotics.   Past Medical History:  Diagnosis Date  . Hypertension     There are no active problems to display for this patient.   Past Surgical History:  Procedure Laterality Date  . ANKLE SURGERY      Prior to Admission medications   Medication Sig Start Date End Date Taking? Authorizing Provider  HYDROcodone-acetaminophen (NORCO/VICODIN) 5-325 MG tablet Take 1 tablet by mouth every 4 (four) hours as needed for moderate pain. 04/28/16   Victorino Dike, FNP  meloxicam (MOBIC) 15 MG tablet Take 1 tablet (15 mg total) by mouth daily. 04/28/16   Victorino Dike, FNP  sulfamethoxazole-trimethoprim (BACTRIM DS,SEPTRA DS) 800-160 MG tablet Take 1 tablet by mouth 2 (two) times daily. 04/28/16   Victorino Dike, FNP    Allergies Review of patient's allergies indicates no known allergies.  No family history on file.  Social History Social History  Substance Use Topics  . Smoking status:  Current Every Day Smoker    Packs/day: 0.50    Types: Cigarettes  . Smokeless tobacco: Not on file  . Alcohol use No    Review of Systems Constitutional: No fever/chills Eyes: No visual changes. ENT: No sore throat. Cardiovascular: Denies chest pain. Respiratory: Denies shortness of breath. Gastrointestinal: No abdominal pain.  No nausea, no vomiting.  No diarrhea.  No constipation. Genitourinary: Negative for dysuria. Musculoskeletal: Negative for back pain. Skin: As above Neurological: Negative for headaches, focal weakness or numbness.  10-point ROS otherwise negative.  ____________________________________________   PHYSICAL EXAM:  VITAL SIGNS: ED Triage Vitals [06/30/16 1010]  Enc Vitals Group     BP (!) 189/110     Pulse Rate 95     Resp 18     Temp 97.8 F (36.6 C)     Temp Source Oral     SpO2 94 %     Weight 185 lb (83.9 kg)     Height 5\' 9"  (1.753 m)     Head Circumference      Peak Flow      Pain Score 9     Pain Loc      Pain Edu?      Excl. in [place]?    Constitutional: Alert and oriented. Well appearing and in no acute distress. Eyes: Conjunctivae are normal. PERRL. EOMI. Head: Atraumatic. Nose: No congestion/rhinnorhea. Mouth/Throat: Mucous membranes are moist.   Neck: No stridor.   Cardiovascular: Normal rate, regular rhythm. Grossly  normal heart sounds.  Good peripheral circulation With present and equal dorsalis pedis pulses bilaterally. Respiratory: Normal respiratory effort.  No retractions. Lungs CTAB. Gastrointestinal: Soft and nontender. No distention.  Musculoskeletal: Right ankle swollen with tenderness and warmth. Swelling extends up to the proximal third of the calf. Patient with mild swelling especially to the medial malleolus which he says is common for him when he has an episode of cellulitis. No pus. No fluctuance. No swelling to the right foot. No obvious effusion. Neurologic:  Normal speech and language. No gross focal neurologic  deficits are appreciated. No gait instability. Skin:  Skin is warm, dry and intact. No rash noted. Psychiatric: Mood and affect are normal. Speech and behavior are normal.  ____________________________________________   LABS (all labs ordered are listed, but only abnormal results are displayed)  Labs Reviewed - No data to display ____________________________________________  EKG   ____________________________________________  RADIOLOGY   ____________________________________________   PROCEDURES  Procedure(s) performed:   Procedures  Critical Care performed:   ____________________________________________   INITIAL IMPRESSION / ASSESSMENT AND PLAN / ED COURSE  Pertinent labs & imaging results that were available during my care of the patient were reviewed by me and considered in my medical decision making (see chart for details).  Patient with recurrent cellulitis of the right lower extremity. We'll discharge with Bactrim as he says has worked in the past. He does not one any further testing at this time and says he has to go. He also takes an ACE inhibitor for his blood pressure and says he has not been taking it the past 3 days but has the medicine at home and says he will continue to take it. He is also requesting pain medication at this time. I reviewed his data base findings and he has had a prescription in the past and this past June as well as May. I told him that I would only be able to give him a very small dose of Norco to go home with. He is understanding of this plan and willing to comply.    Clinical Course     ____________________________________________   FINAL CLINICAL IMPRESSION(S) / ED DIAGNOSES  Right lower summary cellulitis. Uncontrolled hypertension.    NEW MEDICATIONS STARTED DURING THIS VISIT:  New Prescriptions   No medications on file     Note:  This document was prepared using Dragon voice recognition software and may include  unintentional dictation errors.    Orbie Pyo, MD 06/30/16 1120

## 2016-06-30 NOTE — ED Triage Notes (Addendum)
Pt reports reoccurring cellulitis in the rt ankle, state that it started swelling again and getting red with heat a couple days ago. Pt states that he is supposed to follow up with an orthopedic surgeon, but doesn't have insurance. Pt also states that he is supposed to be on bp meds but isn't

## 2018-02-08 ENCOUNTER — Encounter: Payer: Self-pay | Admitting: *Deleted

## 2018-02-08 ENCOUNTER — Other Ambulatory Visit: Payer: Self-pay

## 2018-02-08 ENCOUNTER — Emergency Department
Admission: EM | Admit: 2018-02-08 | Discharge: 2018-02-08 | Disposition: A | Discharge status: Home / Self Care | Payer: Self-pay | Attending: Emergency Medicine | Admitting: Emergency Medicine

## 2018-02-08 ENCOUNTER — Emergency Department: Payer: Self-pay

## 2018-02-08 DIAGNOSIS — J449 Chronic obstructive pulmonary disease, unspecified: Secondary | ICD-10-CM | POA: Insufficient documentation

## 2018-02-08 DIAGNOSIS — I10 Essential (primary) hypertension: Secondary | ICD-10-CM | POA: Insufficient documentation

## 2018-02-08 DIAGNOSIS — T84498A Other mechanical complication of other internal orthopedic devices, implants and grafts, initial encounter: Secondary | ICD-10-CM | POA: Insufficient documentation

## 2018-02-08 DIAGNOSIS — G8929 Other chronic pain: Secondary | ICD-10-CM | POA: Insufficient documentation

## 2018-02-08 DIAGNOSIS — M25571 Pain in right ankle and joints of right foot: Secondary | ICD-10-CM | POA: Insufficient documentation

## 2018-02-08 DIAGNOSIS — Z79899 Other long term (current) drug therapy: Secondary | ICD-10-CM | POA: Insufficient documentation

## 2018-02-08 DIAGNOSIS — F17210 Nicotine dependence, cigarettes, uncomplicated: Secondary | ICD-10-CM | POA: Insufficient documentation

## 2018-02-08 DIAGNOSIS — Y69 Unspecified misadventure during surgical and medical care: Secondary | ICD-10-CM | POA: Insufficient documentation

## 2018-02-08 HISTORY — DX: Chronic obstructive pulmonary disease, unspecified: J44.9

## 2018-02-08 LAB — CBC WITH DIFFERENTIAL/PLATELET
Basophils Absolute: 0.1 10*3/uL (ref 0–0.1)
Basophils Relative: 1 %
EOS ABS: 0.3 10*3/uL (ref 0–0.7)
EOS PCT: 7 %
HCT: 41.5 % (ref 40.0–52.0)
Hemoglobin: 14 g/dL (ref 13.0–18.0)
Lymphocytes Relative: 24 %
Lymphs Abs: 1.2 10*3/uL (ref 1.0–3.6)
MCH: 29.3 pg (ref 26.0–34.0)
MCHC: 33.8 g/dL (ref 32.0–36.0)
MCV: 86.6 fL (ref 80.0–100.0)
MONOS PCT: 8 %
Monocytes Absolute: 0.4 10*3/uL (ref 0.2–1.0)
Neutro Abs: 2.9 10*3/uL (ref 1.4–6.5)
Neutrophils Relative %: 60 %
PLATELETS: 137 10*3/uL — AB (ref 150–440)
RBC: 4.8 MIL/uL (ref 4.40–5.90)
RDW: 18.6 % — ABNORMAL HIGH (ref 11.5–14.5)
WBC: 4.9 10*3/uL (ref 3.8–10.6)

## 2018-02-08 LAB — COMPREHENSIVE METABOLIC PANEL
ALK PHOS: 128 U/L — AB (ref 38–126)
ALT: 50 U/L (ref 17–63)
ANION GAP: 10 (ref 5–15)
AST: 52 U/L — ABNORMAL HIGH (ref 15–41)
Albumin: 3.4 g/dL — ABNORMAL LOW (ref 3.5–5.0)
BUN: 11 mg/dL (ref 6–20)
CALCIUM: 8.8 mg/dL — AB (ref 8.9–10.3)
CHLORIDE: 105 mmol/L (ref 101–111)
CO2: 22 mmol/L (ref 22–32)
Creatinine, Ser: 0.87 mg/dL (ref 0.61–1.24)
GFR calc non Af Amer: >60 mL/min (ref >60)
Glucose, Bld: 120 mg/dL — ABNORMAL HIGH (ref 65–99)
Potassium: 3.5 mmol/L (ref 3.5–5.1)
SODIUM: 137 mmol/L (ref 135–145)
Total Bilirubin: 0.6 mg/dL (ref 0.3–1.2)
Total Protein: 7.8 g/dL (ref 6.5–8.1)

## 2018-02-08 LAB — SEDIMENTATION RATE: Sed Rate: 19 mm/hr (ref 0–20)

## 2018-02-08 MED ORDER — DOXYCYCLINE HYCLATE 100 MG PO CAPS: ORAL_CAPSULE | ORAL | 0 refills | Status: DC

## 2018-02-08 MED ORDER — DOXYCYCLINE HYCLATE 100 MG PO TABS
100.0000 mg | ORAL_TABLET | Freq: Once | ORAL | Status: AC
  Administered 2018-02-08: 100 mg via ORAL
  Filled 2018-02-08: qty 1

## 2018-02-08 MED ORDER — CEPHALEXIN 500 MG PO CAPS: 500.0000 mg | ORAL_CAPSULE | Freq: Four times a day (QID) | ORAL | 0 refills | Status: DC

## 2018-02-08 MED ORDER — TRAMADOL HCL 50 MG PO TABS: ORAL_TABLET | ORAL | 0 refills | Status: DC

## 2018-02-08 MED ORDER — CEPHALEXIN 500 MG PO CAPS
500.0000 mg | ORAL_CAPSULE | Freq: Once | ORAL | Status: AC
  Administered 2018-02-08: 500 mg via ORAL
  Filled 2018-02-08: qty 1

## 2018-02-08 NOTE — ED Triage Notes (Signed)
Pt reports pain in right ankle that has been intermittent in nature for the past year. Pt has been dx with cellulitis and treated with antibiotics and reports the pain would then subside. For the past three weeks pain has returned and warmth noted in right foot. Swelling around the ankle and a screw is noted sticking out of the skin. No drainage and no increased irritation around screw. No fevers reported. Pulse intact, movement and sensation intact as well.

## 2018-02-08 NOTE — ED Provider Notes (Signed)
[facility] Emergency Department Provider Note  ____________________________________________   First MD Initiated Contact with Patient 02/08/18 [id number]     (approximate)  I have reviewed the triage vital signs and the nursing notes.   HISTORY  Chief Complaint Ankle Pain and Leg Pain    HPI Wayne Burns is a 62 y.o. male with a history of extensive prior right lower extremity surgery due to severely fractured ankle/fibula in 2012 at [facility].  He presents for evaluation of gradually worsening pain in the right ankle over the last 3-4 weeks.  He states that this is happened multiple times over the last few years where he gets some swelling and redness and pain in his ankle and he goes on a course of antibiotics and gets better.  However this time he felt a hard lump on the medial aspect of the ankle and initially did not think much of it, but was surprised when 1 of his operative screws poked through the skin.  It is currently protruding from the skin and he thinks it is been sticking out for a couple of weeks.  He came in tonight because of the pain he experiences with ambulation.  Nothing in particular makes it better.  He has not been on antibiotics for an extended period of time.  He does have some swelling and pain in the ankle as he says it feels warm but it does not seem as red as usual when he goes on antibiotics.  He says that sometimes he thinks he might have a fever but he is not certain.  He denies chest pain, shortness of breath, nausea, vomiting, and abdominal pain.  The pain in his foot and ankle waxes and wanes from mild to severe.  He states that he has not been able to follow-up with orthopedics due to financial limitations.  Past Medical History:  Diagnosis Date  . COPD (chronic obstructive pulmonary disease) ([place])   . Hypertension     There are no active problems to display for this patient.   Past Surgical History:  Procedure Laterality Date  . ANKLE  SURGERY      Prior to Admission medications   Medication Sig Start Date End Date Taking? Authorizing Provider  cephALEXin (KEFLEX) 500 MG capsule Take 1 capsule (500 mg total) by mouth 4 (four) times daily. 02/08/18   Hinda Kehr, MD  doxycycline (VIBRAMYCIN) 100 MG capsule Take 1 capsule (100 mg) by mouth twice daily for 10 days. 02/08/18   Hinda Kehr, MD  meloxicam (MOBIC) 15 MG tablet Take 1 tablet (15 mg total) by mouth daily. 04/28/16   Triplett, Johnette Abraham B, FNP  sulfamethoxazole-trimethoprim (BACTRIM DS,SEPTRA DS) 800-160 MG tablet Take 2 tablets by mouth 2 (two) times daily. 06/30/16   Orbie Pyo, MD  traMADol Veatrice Bourbon) 50 MG tablet Take 1-2 tablets by mouth every 6 hours as needed for moderate to severe pain 02/08/18   Hinda Kehr, MD  albuterol (PROVENTIL) (2.5 MG/3ML) 0.083% nebulizer solution Take 3 mLs (2.5 mg total) by nebulization every 6 (six) hours as needed for wheezing or shortness of breath. 12/17/15 03/26/16  Menshew, Dannielle Karvonen, PA-C    Allergies Patient has no known allergies.  History reviewed. No pertinent family history.  Social History Social History   Tobacco Use  . Smoking status: Current Every Day Smoker    Packs/day: 0.50    Types: Cigarettes  . Smokeless tobacco: Never Used  Substance Use Topics  . Alcohol use: No  .  Drug use: Not on file    Review of Systems Constitutional: Occasional subjective fevers over the last month ENT: No sore throat. Cardiovascular: Denies chest pain. Respiratory: Denies shortness of breath. Gastrointestinal: No abdominal pain.  No nausea, no vomiting.  No diarrhea.  No constipation. Genitourinary: Negative for dysuria. Musculoskeletal: Gradually worsening pain and swelling in right ankle with protruding screw Integumentary: Negative for rash except for some redness around right ankle.  Protruding orthopedic screw Neurological: Negative for headaches, focal weakness or  numbness.   ____________________________________________   PHYSICAL EXAM:  VITAL SIGNS: ED Triage Vitals [02/08/18 0221]  Enc Vitals Group     BP (!) 159/84     Pulse Rate 93     Resp 16     Temp 98.5 F (36.9 C)     Temp Source Oral     SpO2 97 %     Weight 86.2 kg (190 lb)     Height 1.753 m (_0 )     Head Circumference      Peak Flow      Pain Score 6     Pain Loc      Pain Edu?      Excl. in [place]?     Constitutional: Alert and oriented.  Disheveled but generally well-appearing and in no acute distress Eyes: Conjunctivae are normal.  Head: Atraumatic. Nose: No congestion/rhinnorhea. Mouth/Throat: Mucous membranes are moist. Neck: No stridor.  No meningeal signs.   Cardiovascular: Normal rate, regular rhythm. Good peripheral circulation. Grossly normal heart sounds. Respiratory: Normal respiratory effort.  No retractions. Lungs CTAB. Gastrointestinal: Soft and nontender. No distention.  Musculoskeletal: Chronic skin changes and postsurgical changes of the right lower extremity after his reported history of extensive orthopedic surgery.  He has the head of the screw protruding from the medial aspect of his ankle with no surrounding cellulitis or inflammation and no purulent drainage.  There is skin thickening and chronic discoloration but I see no evidence of acute cellulitis.  There is some mild edema all throughout the ankle and lower leg.  There is no clinically significant degree of warmth different in that area compared to the rest of his leg. Neurologic:  Normal speech and language. No gross focal neurologic deficits are appreciated.  Skin:  Skin is warm, dry and intact. No rash noted.   ____________________________________________   LABS (all labs ordered are listed, but only abnormal results are displayed)  Labs Reviewed  CBC WITH DIFFERENTIAL/PLATELET - Abnormal; Notable for the following components:      Result Value   RDW 18.6 (*)    Platelets 137 (*)     All other components within normal limits  COMPREHENSIVE METABOLIC PANEL - Abnormal; Notable for the following components:   Glucose, Bld 120 (*)    Calcium 8.8 (*)    Albumin 3.4 (*)    AST 52 (*)    Alkaline Phosphatase 128 (*)    All other components within normal limits  SEDIMENTATION RATE   ____________________________________________  EKG  None - EKG not ordered by ED physician ____________________________________________  RADIOLOGY Ursula Alert, personally viewed and evaluated these images (plain radiographs) as part of my medical decision making, as well as reviewing the written report by the radiologist.  ED MD interpretation: I do not appreciate any acute abnormalities of a lot of chronic postsurgical changes.  Radiologist report suggests neuropathic joint, repetitive stress injury with fracture, and/or osteomyelitis, or at least states that osteomyelitis cannot be excluded.  Official radiology  report(s): Dg Ankle Complete Right  Result Date: 02/08/2018 CLINICAL DATA:  62 y/o M; right ankle pain intermittent in nature for the past year. Treated with antibiotics for cellulitis. Three weeks ago pain returned with warmth of the foot. EXAM: RIGHT ANKLE - COMPLETE 3+ VIEW COMPARISON:  10/31/2015 right lower extremity radiograph FINDINGS: Interval deformity in loss of height of the talar dome. Ill-defined lucency is present within the tibial talar joint. A transversely oriented screw traversing the medial malleolus has migrated outward and exited the skin. Plate and screw fixation of the distal tibia is otherwise stable. 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 IMPRESSION: 1. Ill-defined bony lucency is present within the tibiotalar joint and there has been interval loss of height of the talar dome and downward migration of hardware from 2016. Findings may be related to neuropathic joint, repetitive stress injury with  fracture, and osteomyelitis is not excluded. Consider hindfoot MRI or three-phase bone scan to further characterize. 2. A transversely oriented medial malleolar fixation screw has migrated outward beyond the skin surface. Electronically Signed   By: Kristine Garbe M.D.   On: 02/08/2018 03:18    ____________________________________________   PROCEDURES  Critical Care performed: No   Procedure(s) performed:   Procedures   ____________________________________________   INITIAL IMPRESSION / ASSESSMENT AND PLAN / ED COURSE  As part of my medical decision making, I reviewed the following data within the [place] notes reviewed and incorporated, Labs reviewed , Old chart reviewed, Radiograph reviewed  and Notes from prior ED visits    Differential diagnosis includes, but is not limited to, chronic changes associated with surgery 7 years ago, cellulitis, deep tissue infection such as necrotizing fasciitis, osteomyelitis, acute fracture dislocation, hardware malfunction.  The patient obviously has exposed hardware at this time which she states is relatively new but has still been present probably for at least a month.  He has no obvious signs of cellulitis at this point but I would not be surprised if he has at least some degree of osteomyelitis given the  Open wound.  He has no sign of systemic illness.  I will hold up on labs until I check radiographs to see if there is any change from the last radiographs on record from 2016.  I have already stressed the patient the importance of following up with orthopedics but at this point I doubt that there will be an acute or emergent issue that requires hospitalization.  I told him we would reassess after the radiographs.  Clinical Course as of Feb 08 646  Sat Feb 08, 2018  0960 Some subacute abnormalities on the radiographs suggest the possibility of progressive stress injury over time but the radiologist  specifically stated that osteomyelitis cannot be excluded.  Given the complicated nature of the patient's injury, the presence of hardware, the fact that a screw is now protruding from the skin and osteomyelitis cannot be excluded by radiograph, I am calling to speak with my local orthopedic surgeon, Dr. Harlow Mares to discuss the case and ask for recommendations.  DG Ankle Complete Right [CF]  0414 I spoke by phone with Dr. Harlow Mares with orthopedics.  He agreed that this is a complex case and the patient needs to follow-up at [facility], and academic center and higher level of care where the surgeries were performed originally.  He agreed that osteomyelitis is very possible and perhaps even likely but it most likely is chronic particularly if  he has had an open wound for several weeks.  With no sign of systemic infection there is really no indication for admission or urgent or emergent intervention.  He recommended that I start antibiotics and have the patient follow-up as soon as possible with orthopedics.  I will check basic lab work as a baseline and start him on both Keflex and doxycycline for broader coverage including MRSA.  I reiterated with him again that it is critical he follow-up with [facility] orthopedics because his hardware will need to be removed and if he allows this situation to continue he may very well lose his leg or become septic.  He states that he understands and will call to schedule a follow-up appointment.   [CF]  8472 No evidence of infection based on CBC.  CBC with Differential/Platelet(!) [CF]  0539 ESR within normal limits which is reassuring regarding the osteomyelitis question.  Sed Rate: 19 [CF]  0721 Metabolic panel unremarkable as well.  I have updated the patient with all of his reassuring lab results and for the third time I reiterated the need for him to go to [facility] orthopedics.  He states that he understands and agrees with the plan.  I gave my usual and customary return precautions.    Comprehensive metabolic panel(!) [CF]    Clinical Course User Index [CF] Hinda Kehr, MD    ____________________________________________  FINAL CLINICAL IMPRESSION(S) / ED DIAGNOSES  Final diagnoses:  Chronic pain of right ankle  Exposed orthopaedic hardware [facility])     MEDICATIONS GIVEN DURING THIS VISIT:  Medications  cephALEXin (KEFLEX) capsule 500 mg (500 mg Oral Given 02/08/18 0455)  doxycycline (VIBRA-TABS) tablet 100 mg (100 mg Oral Given 02/08/18 0455)     ED Discharge Orders        Ordered    cephALEXin (KEFLEX) 500 MG capsule  4 times daily     02/08/18 0542    doxycycline (VIBRAMYCIN) 100 MG capsule     02/08/18 0542    traMADol (ULTRAM) 50 MG tablet     02/08/18 0542       Note:  This document was prepared using Dragon voice recognition software and may include unintentional dictation errors.    Hinda Kehr, MD 02/08/18 [phone]

## 2018-02-08 NOTE — Discharge Instructions (Signed)
As we discussed, there is no evidence of acute infection at this time, but it is very concerning that your old orthopedic hardware is now protruding from your skin.  This is a very serious potential source of infection and could lead to long-term disability or even a bad infection that results in the amputation of your foot.  Please call the number provided to set up a follow-up appointment with [facility] orthopedics since they are a big academic center and the ones who did your original surgery back in 2012.  If you absolutely cannot follow-up with [facility], we also provided the name and number of a local orthopedic surgeon, but they too will likely recommend that you follow-up at [facility] where they have more hardware, equipment, and specialist available.  Please take the full course of both antibiotics as prescribed.  Use over-the-counter ibuprofen and Tylenol as needed for pain control whenever possible.  Take Tramadol as prescribed for severe pain. Do not drink alcohol, drive or participate in any other potentially dangerous activities while taking this medication as it may make you sleepy. Do not take this medication with any other sedating medications, either prescription or over-the-counter.    Use it as little as possible to achieve adequate pain control.  Do not use or use it with extreme caution if you have a history of opiate abuse or dependence.  If you are on a pain contract with your primary care doctor or a pain specialist, be sure to let them know you were prescribed this medication today from the [facility] Emergency Department.  This medication is intended for your use only - do not give any to anyone else and keep it in a secure place where nobody else, especially children, have access to it.  It will also cause or worsen constipation, so you may want to consider taking an over-the-counter stool softener while you are taking this medication.

## 2018-02-13 ENCOUNTER — Ambulatory Visit: Admit: 2018-02-13 | Discharge: 2018-02-13

## 2018-02-13 DIAGNOSIS — S82871S Displaced pilon fracture of right tibia, sequela: Principal | ICD-10-CM

## 2018-11-11 ENCOUNTER — Encounter: Admit: 2018-11-11 | Discharge: 2018-11-11 | Disposition: A | Discharge status: Home / Self Care | Payer: MEDICARE

## 2018-11-11 DIAGNOSIS — M25571 Pain in right ankle and joints of right foot: Principal | ICD-10-CM

## 2018-11-11 MED ORDER — SULFAMETHOXAZOLE 800 MG-TRIMETHOPRIM 160 MG TABLET
ORAL_TABLET | Freq: Two times a day (BID) | ORAL | 0 refills | 0.00000 days | Status: CP
Start: 2018-11-11 — End: 2018-11-25
  Filled 2018-11-11: qty 28, 14d supply, fill #0

## 2018-11-11 MED ORDER — SULFAMETHOXAZOLE 800 MG-TRIMETHOPRIM 160 MG TABLET: tablet | 0 refills | 0 days

## 2018-11-11 MED FILL — SULFAMETHOXAZOLE 800 MG-TRIMETHOPRIM 160 MG TABLET: 14 days supply | Qty: 28 | Fill #0 | Status: AC

## 2018-11-24 ENCOUNTER — Ambulatory Visit
Admit: 2018-11-24 | Discharge: 2018-11-24 | Disposition: A | Discharge status: Home / Self Care | Payer: MEDICARE | Attending: Family

## 2018-11-24 MED ORDER — CEPHALEXIN 500 MG CAPSULE
ORAL_CAPSULE | Freq: Four times a day (QID) | ORAL | 0 refills | 0 days | Status: CP
Start: 2018-11-24 — End: 2018-12-04
  Filled 2018-11-24: qty 40, 10d supply, fill #0

## 2018-11-24 MED ORDER — SULFAMETHOXAZOLE 800 MG-TRIMETHOPRIM 160 MG TABLET
ORAL_TABLET | Freq: Two times a day (BID) | ORAL | 0 refills | 0 days | Status: CP
Start: 2018-11-24 — End: 2018-12-04
  Filled 2018-11-24: qty 40, 10d supply, fill #0

## 2018-11-24 MED FILL — SULFAMETHOXAZOLE 800 MG-TRIMETHOPRIM 160 MG TABLET: 10 days supply | Qty: 40 | Fill #0 | Status: AC

## 2018-11-24 MED FILL — CEPHALEXIN 500 MG CAPSULE: 10 days supply | Qty: 40 | Fill #0 | Status: AC

## 2018-12-01 ENCOUNTER — Ambulatory Visit: Admit: 2018-12-01 | Discharge: 2018-12-02

## 2018-12-01 DIAGNOSIS — M898X6 Other specified disorders of bone, lower leg: Principal | ICD-10-CM

## 2018-12-01 MED ORDER — AMOXICILLIN 875 MG-POTASSIUM CLAVULANATE 125 MG TABLET
ORAL_TABLET | Freq: Two times a day (BID) | ORAL | 0 refills | 0.00000 days | Status: CP
Start: 2018-12-01 — End: 2018-12-25
  Filled 2018-12-01: qty 56, 28d supply, fill #0

## 2018-12-01 MED FILL — AMOXICILLIN 875 MG-POTASSIUM CLAVULANATE 125 MG TABLET: 28 days supply | Qty: 56 | Fill #0 | Status: AC

## 2018-12-25 ENCOUNTER — Ambulatory Visit: Admit: 2018-12-25 | Discharge: 2018-12-26

## 2018-12-25 DIAGNOSIS — S82871S Displaced pilon fracture of right tibia, sequela: Principal | ICD-10-CM

## 2018-12-25 MED ORDER — AMOXICILLIN 875 MG-POTASSIUM CLAVULANATE 125 MG TABLET
ORAL_TABLET | Freq: Two times a day (BID) | ORAL | 0 refills | 0.00000 days | Status: CP
Start: 2018-12-25 — End: 2018-12-25
  Filled 2018-12-25: qty 56, 28d supply, fill #0

## 2018-12-25 MED ORDER — AMOXICILLIN 875 MG-POTASSIUM CLAVULANATE 125 MG TABLET: 1 | tablet | Freq: Two times a day (BID) | 0 refills | 0 days | Status: AC

## 2018-12-25 MED FILL — AMOXICILLIN 875 MG-POTASSIUM CLAVULANATE 125 MG TABLET: 28 days supply | Qty: 56 | Fill #0 | Status: AC

## 2019-01-09 ENCOUNTER — Ambulatory Visit: Admit: 2019-01-09 | Discharge: 2019-01-10

## 2019-01-09 DIAGNOSIS — F32A Depression, unspecified: Secondary | ICD-10-CM | POA: Diagnosis present

## 2019-01-09 DIAGNOSIS — I10 Essential (primary) hypertension: Secondary | ICD-10-CM | POA: Diagnosis present

## 2019-01-09 DIAGNOSIS — Z01810 Encounter for preprocedural cardiovascular examination: Principal | ICD-10-CM

## 2019-01-09 DIAGNOSIS — G4700 Insomnia, unspecified: Principal | ICD-10-CM

## 2019-01-09 DIAGNOSIS — R030 Elevated blood-pressure reading, without diagnosis of hypertension: Principal | ICD-10-CM

## 2019-01-09 DIAGNOSIS — F329 Major depressive disorder, single episode, unspecified: Principal | ICD-10-CM

## 2019-01-09 DIAGNOSIS — Z967 Presence of other bone and tendon implants: Principal | ICD-10-CM

## 2019-01-09 MED ORDER — AMLODIPINE 5 MG TABLET
ORAL_TABLET | Freq: Every day | ORAL | 5 refills | 0.00000 days | Status: CP
Start: 2019-01-09 — End: 2019-04-23
  Filled 2019-01-09: qty 30, 30d supply, fill #0

## 2019-01-09 MED ORDER — TRAZODONE 50 MG TABLET
ORAL_TABLET | Freq: Every evening | ORAL | 2 refills | 0 days | Status: CP
Start: 2019-01-09 — End: 2019-04-23
  Filled 2019-01-09: qty 30, 30d supply, fill #0

## 2019-01-09 MED ORDER — SERTRALINE 25 MG TABLET
ORAL_TABLET | Freq: Every day | ORAL | 2 refills | 0.00000 days | Status: CP
Start: 2019-01-09 — End: 2019-04-23
  Filled 2019-01-09: qty 60, 30d supply, fill #0

## 2019-01-09 MED FILL — TRAZODONE 50 MG TABLET: 30 days supply | Qty: 30 | Fill #0 | Status: AC

## 2019-01-09 MED FILL — SERTRALINE 25 MG TABLET: 30 days supply | Qty: 60 | Fill #0 | Status: AC

## 2019-01-09 MED FILL — AMLODIPINE 5 MG TABLET: 30 days supply | Qty: 30 | Fill #0 | Status: AC

## 2019-02-02 MED FILL — AMLODIPINE 5 MG TABLET: ORAL | 30 days supply | Qty: 30 | Fill #0

## 2019-02-02 MED FILL — TRAZODONE 50 MG TABLET: ORAL | 30 days supply | Qty: 30 | Fill #0

## 2019-02-02 MED FILL — AMLODIPINE 5 MG TABLET: 30 days supply | Qty: 30 | Fill #0 | Status: AC

## 2019-02-02 MED FILL — SERTRALINE 25 MG TABLET: ORAL | 30 days supply | Qty: 60 | Fill #0

## 2019-02-02 MED FILL — SERTRALINE 25 MG TABLET: 30 days supply | Qty: 60 | Fill #0 | Status: AC

## 2019-02-02 MED FILL — TRAZODONE 50 MG TABLET: 30 days supply | Qty: 30 | Fill #0 | Status: AC

## 2019-03-11 MED FILL — AMLODIPINE 5 MG TABLET: 30 days supply | Qty: 30 | Fill #1 | Status: AC

## 2019-03-11 MED FILL — TRAZODONE 50 MG TABLET: ORAL | 30 days supply | Qty: 30 | Fill #1

## 2019-03-11 MED FILL — SERTRALINE 25 MG TABLET: ORAL | 30 days supply | Qty: 60 | Fill #1

## 2019-03-11 MED FILL — SERTRALINE 25 MG TABLET: 30 days supply | Qty: 60 | Fill #1 | Status: AC

## 2019-03-11 MED FILL — AMLODIPINE 5 MG TABLET: ORAL | 30 days supply | Qty: 30 | Fill #1

## 2019-03-11 MED FILL — TRAZODONE 50 MG TABLET: 30 days supply | Qty: 30 | Fill #1 | Status: AC

## 2019-04-23 MED ORDER — SERTRALINE 25 MG TABLET
ORAL_TABLET | Freq: Every day | ORAL | 5 refills | 0 days | Status: CP
Start: 2019-04-23 — End: 2019-04-27

## 2019-04-23 MED ORDER — AMLODIPINE 5 MG TABLET
ORAL_TABLET | Freq: Every day | ORAL | 5 refills | 0.00000 days | Status: CP
Start: 2019-04-23 — End: 2019-04-27

## 2019-04-23 MED ORDER — TRAZODONE 50 MG TABLET
ORAL_TABLET | Freq: Every evening | ORAL | 5 refills | 0.00000 days | Status: CP
Start: 2019-04-23 — End: 2019-04-27

## 2019-04-27 ENCOUNTER — Ambulatory Visit: Admit: 2019-04-27 | Discharge: 2019-04-28 | Attending: Sports Medicine | Primary: Sports Medicine

## 2019-04-27 DIAGNOSIS — G4700 Insomnia, unspecified: Secondary | ICD-10-CM

## 2019-04-27 DIAGNOSIS — Z23 Encounter for immunization: Secondary | ICD-10-CM

## 2019-04-27 DIAGNOSIS — B1920 Unspecified viral hepatitis C without hepatic coma: Secondary | ICD-10-CM

## 2019-04-27 DIAGNOSIS — D696 Thrombocytopenia, unspecified: Secondary | ICD-10-CM

## 2019-04-27 DIAGNOSIS — I10 Essential (primary) hypertension: Principal | ICD-10-CM

## 2019-04-27 DIAGNOSIS — L0390 Cellulitis, unspecified: Secondary | ICD-10-CM

## 2019-04-27 DIAGNOSIS — F329 Major depressive disorder, single episode, unspecified: Secondary | ICD-10-CM

## 2019-04-27 DIAGNOSIS — M2170 Unequal limb length (acquired), unspecified site: Secondary | ICD-10-CM

## 2019-04-27 DIAGNOSIS — Z967 Presence of other bone and tendon implants: Secondary | ICD-10-CM

## 2019-04-27 DIAGNOSIS — R945 Abnormal results of liver function studies: Secondary | ICD-10-CM

## 2019-04-27 MED ORDER — AMLODIPINE 10 MG TABLET
ORAL_TABLET | Freq: Every day | ORAL | 1 refills | 0.00000 days | Status: CP
Start: 2019-04-27 — End: 2019-10-24
  Filled 2019-04-30: qty 90, 90d supply, fill #0

## 2019-04-27 MED ORDER — TRAZODONE 50 MG TABLET
ORAL_TABLET | Freq: Every evening | ORAL | 5 refills | 0 days | Status: CP
Start: 2019-04-27 — End: 2019-05-15
  Filled 2019-04-30: qty 30, 30d supply, fill #0

## 2019-04-27 MED ORDER — AMOXICILLIN 875 MG-POTASSIUM CLAVULANATE 125 MG TABLET
ORAL_TABLET | Freq: Two times a day (BID) | ORAL | 6 refills | 0 days | Status: CP
Start: 2019-04-27 — End: 2019-05-30
  Filled 2019-04-30: qty 60, 30d supply, fill #0

## 2019-04-27 MED ORDER — SERTRALINE 50 MG TABLET
ORAL_TABLET | Freq: Every day | ORAL | 1 refills | 0 days | Status: CP
Start: 2019-04-27 — End: 2019-07-29
  Filled 2019-04-30: qty 90, 90d supply, fill #0

## 2019-04-28 DIAGNOSIS — R7989 Other specified abnormal findings of blood chemistry: Secondary | ICD-10-CM | POA: Diagnosis present

## 2019-04-30 MED FILL — AMLODIPINE 10 MG TABLET: 90 days supply | Qty: 90 | Fill #0 | Status: AC

## 2019-04-30 MED FILL — SERTRALINE 50 MG TABLET: 90 days supply | Qty: 90 | Fill #0 | Status: AC

## 2019-04-30 MED FILL — AMOXICILLIN 875 MG-POTASSIUM CLAVULANATE 125 MG TABLET: 30 days supply | Qty: 60 | Fill #0 | Status: AC

## 2019-04-30 MED FILL — TRAZODONE 50 MG TABLET: 30 days supply | Qty: 30 | Fill #0 | Status: AC

## 2019-05-04 ENCOUNTER — Ambulatory Visit: Admit: 2019-05-04 | Discharge: 2019-05-05

## 2019-05-04 DIAGNOSIS — B1920 Unspecified viral hepatitis C without hepatic coma: Principal | ICD-10-CM

## 2019-05-04 DIAGNOSIS — R945 Abnormal results of liver function studies: Secondary | ICD-10-CM

## 2019-05-04 DIAGNOSIS — D696 Thrombocytopenia, unspecified: Secondary | ICD-10-CM

## 2019-05-05 ENCOUNTER — Ambulatory Visit: Admit: 2019-05-05 | Discharge: 2019-05-06 | Attending: Family Medicine | Primary: Family Medicine

## 2019-05-05 DIAGNOSIS — I10 Essential (primary) hypertension: Secondary | ICD-10-CM

## 2019-05-05 DIAGNOSIS — F329 Major depressive disorder, single episode, unspecified: Secondary | ICD-10-CM

## 2019-05-05 DIAGNOSIS — N401 Enlarged prostate with lower urinary tract symptoms: Secondary | ICD-10-CM

## 2019-05-05 DIAGNOSIS — L0390 Cellulitis, unspecified: Principal | ICD-10-CM

## 2019-05-05 DIAGNOSIS — R350 Frequency of micturition: Secondary | ICD-10-CM

## 2019-05-05 MED ORDER — TAMSULOSIN 0.4 MG CAPSULE
ORAL_CAPSULE | Freq: Every day | ORAL | 3 refills | 0.00000 days | Status: CP
Start: 2019-05-05 — End: ?
  Filled 2019-05-11: qty 90, 90d supply, fill #0

## 2019-05-11 MED FILL — TAMSULOSIN 0.4 MG CAPSULE: 90 days supply | Qty: 90 | Fill #0 | Status: AC

## 2019-05-13 ENCOUNTER — Ambulatory Visit: Admit: 2019-05-13 | Discharge: 2019-05-14 | Attending: Gastroenterology | Primary: Gastroenterology

## 2019-05-13 DIAGNOSIS — R945 Abnormal results of liver function studies: Secondary | ICD-10-CM

## 2019-05-13 DIAGNOSIS — B182 Chronic viral hepatitis C: Principal | ICD-10-CM

## 2019-05-15 ENCOUNTER — Ambulatory Visit: Admit: 2019-05-15 | Discharge: 2019-05-16

## 2019-05-15 DIAGNOSIS — Z967 Presence of other bone and tendon implants: Secondary | ICD-10-CM

## 2019-05-15 DIAGNOSIS — I10 Essential (primary) hypertension: Secondary | ICD-10-CM

## 2019-05-15 DIAGNOSIS — N1330 Unspecified hydronephrosis: Secondary | ICD-10-CM

## 2019-05-15 DIAGNOSIS — B1920 Unspecified viral hepatitis C without hepatic coma: Secondary | ICD-10-CM

## 2019-05-15 DIAGNOSIS — R339 Retention of urine, unspecified: Secondary | ICD-10-CM

## 2019-05-15 DIAGNOSIS — J438 Other emphysema: Principal | ICD-10-CM

## 2019-05-15 DIAGNOSIS — G4700 Insomnia, unspecified: Secondary | ICD-10-CM

## 2019-05-15 MED ORDER — SPIRIVA WITH HANDIHALER 18 MCG AND INHALATION CAPSULES
ORAL_CAPSULE | Freq: Every day | RESPIRATORY_TRACT | 5 refills | 30 days | Status: CP
Start: 2019-05-15 — End: 2019-11-11
  Filled 2019-08-06: qty 4, 30d supply, fill #0

## 2019-05-15 MED ORDER — TRAZODONE 50 MG TABLET
ORAL_TABLET | Freq: Every evening | ORAL | 2 refills | 30 days | Status: CP
Start: 2019-05-15 — End: 2019-08-13
  Filled 2019-05-25: qty 60, 30d supply, fill #0

## 2019-05-19 DIAGNOSIS — N1330 Unspecified hydronephrosis: Secondary | ICD-10-CM | POA: Insufficient documentation

## 2019-05-25 MED FILL — TRAZODONE 50 MG TABLET: 30 days supply | Qty: 60 | Fill #0 | Status: AC

## 2019-05-26 ENCOUNTER — Institutional Professional Consult (permissible substitution): Admit: 2019-05-26 | Discharge: 2019-05-27

## 2019-05-26 DIAGNOSIS — M25571 Pain in right ankle and joints of right foot: Secondary | ICD-10-CM

## 2019-05-26 DIAGNOSIS — G8929 Other chronic pain: Secondary | ICD-10-CM

## 2019-05-26 DIAGNOSIS — D696 Thrombocytopenia, unspecified: Secondary | ICD-10-CM

## 2019-05-26 DIAGNOSIS — N1330 Unspecified hydronephrosis: Principal | ICD-10-CM

## 2019-05-26 DIAGNOSIS — B182 Chronic viral hepatitis C: Secondary | ICD-10-CM

## 2019-05-26 DIAGNOSIS — Z967 Presence of other bone and tendon implants: Secondary | ICD-10-CM

## 2019-06-25 ENCOUNTER — Emergency Department: Payer: Self-pay

## 2019-06-25 ENCOUNTER — Encounter: Payer: Self-pay | Admitting: Emergency Medicine

## 2019-06-25 ENCOUNTER — Emergency Department
Admission: EM | Admit: 2019-06-25 | Discharge: 2019-06-25 | Disposition: A | Discharge status: Home / Self Care | Payer: Self-pay | Attending: Emergency Medicine | Admitting: Emergency Medicine

## 2019-06-25 DIAGNOSIS — S0990XA Unspecified injury of head, initial encounter: Secondary | ICD-10-CM

## 2019-06-25 DIAGNOSIS — W19XXXA Unspecified fall, initial encounter: Secondary | ICD-10-CM | POA: Insufficient documentation

## 2019-06-25 DIAGNOSIS — Z789 Other specified health status: Secondary | ICD-10-CM

## 2019-06-25 DIAGNOSIS — Z7289 Other problems related to lifestyle: Secondary | ICD-10-CM

## 2019-06-25 DIAGNOSIS — Y999 Unspecified external cause status: Secondary | ICD-10-CM | POA: Insufficient documentation

## 2019-06-25 DIAGNOSIS — Y939 Activity, unspecified: Secondary | ICD-10-CM | POA: Insufficient documentation

## 2019-06-25 DIAGNOSIS — J449 Chronic obstructive pulmonary disease, unspecified: Secondary | ICD-10-CM | POA: Insufficient documentation

## 2019-06-25 DIAGNOSIS — F1010 Alcohol abuse, uncomplicated: Secondary | ICD-10-CM | POA: Insufficient documentation

## 2019-06-25 DIAGNOSIS — Z87891 Personal history of nicotine dependence: Secondary | ICD-10-CM | POA: Insufficient documentation

## 2019-06-25 DIAGNOSIS — Z791 Long term (current) use of non-steroidal anti-inflammatories (NSAID): Secondary | ICD-10-CM | POA: Insufficient documentation

## 2019-06-25 DIAGNOSIS — I10 Essential (primary) hypertension: Secondary | ICD-10-CM | POA: Insufficient documentation

## 2019-06-25 DIAGNOSIS — Y929 Unspecified place or not applicable: Secondary | ICD-10-CM | POA: Insufficient documentation

## 2019-06-25 DIAGNOSIS — S0081XA Abrasion of other part of head, initial encounter: Secondary | ICD-10-CM | POA: Insufficient documentation

## 2019-06-25 NOTE — ED Triage Notes (Signed)
Pt found by sheriff and pt stating he had a fall. Pt was brought her by [facility] for evaluation after the fall. Pt is unsure of where are how he fell. Pt has an abrasion on his left periorbital area. Pt possibly has ETOH on board. Pt takes a baby ASA at home.

## 2019-06-25 NOTE — ED Provider Notes (Signed)
[facility] Emergency Department Provider Note  ____________________________________________   I have reviewed the triage vital signs and the nursing notes.   HISTORY  Chief Complaint Fall   History limited by: Not Limited   HPI Wayne Burns is a 63 y.o. male who presents to the emergency department today via EMS today after being found after a fall. The patient states he does not remember falling. Does admit to drinking alcohol today. Denies any pain in his head. Denies any traumatic injuries. Denies any recent illness, chest pain or shortness of breath.   Records reviewed. Per medical record review patient has a history of COPD, HTN.  Past Medical History:  Diagnosis Date  . COPD (chronic obstructive pulmonary disease) ([place])   . Hypertension     There are no active problems to display for this patient.   Past Surgical History:  Procedure Laterality Date  . ANKLE SURGERY      Prior to Admission medications   Medication Sig Start Date End Date Taking? Authorizing Provider  cephALEXin (KEFLEX) 500 MG capsule Take 1 capsule (500 mg total) by mouth 4 (four) times daily. 02/08/18   Hinda Kehr, MD  doxycycline (VIBRAMYCIN) 100 MG capsule Take 1 capsule (100 mg) by mouth twice daily for 10 days. 02/08/18   Hinda Kehr, MD  meloxicam (MOBIC) 15 MG tablet Take 1 tablet (15 mg total) by mouth daily. 04/28/16   Triplett, Johnette Abraham B, FNP  sulfamethoxazole-trimethoprim (BACTRIM DS,SEPTRA DS) 800-160 MG tablet Take 2 tablets by mouth 2 (two) times daily. 06/30/16   Schaevitz, Randall An, MD  traMADol Veatrice Bourbon) 50 MG tablet Take 1-2 tablets by mouth every 6 hours as needed for moderate to severe pain 02/08/18   Hinda Kehr, MD    Allergies Patient has no known allergies.  History reviewed. No pertinent family history.  Social History Social History   Tobacco Use  . Smoking status: Former Smoker    Packs/day: 0.50    Types: Cigarettes  . Smokeless tobacco:  Never Used  Substance Use Topics  . Alcohol use: Yes    Comment: often  . Drug use: Not Currently    Review of Systems Constitutional: No fever/chills Eyes: No visual changes. ENT: No sore throat. Cardiovascular: Denies chest pain. Respiratory: Denies shortness of breath. Gastrointestinal: No abdominal pain.  No nausea, no vomiting.  No diarrhea.   Genitourinary: Negative for dysuria. Musculoskeletal: Negative for back pain. Skin: Negative for rash. Neurological: Negative for headaches, focal weakness or numbness.  ____________________________________________   PHYSICAL EXAM:  VITAL SIGNS: ED Triage Vitals  Enc Vitals Group     BP 06/25/19 1811 107/68     Pulse Rate 06/25/19 1811 73     Resp 06/25/19 1811 20     Temp 06/25/19 1816 98.6 F (37 C)     Temp Source 06/25/19 1816 Oral     SpO2 06/25/19 1811 96 %     Weight 06/25/19 1813 165 lb (74.8 kg)     Height 06/25/19 1813 5\' 9"  (1.753 m)     Head Circumference --      Peak Flow --      Pain Score 06/25/19 1812 0   Constitutional: Alert and oriented.  Eyes: Conjunctivae are normal.  ENT      Head: Normocephalic. Abrasion noted to forehead.      Nose: No congestion/rhinnorhea.      Mouth/Throat: Mucous membranes are moist.      Neck: No stridor. No midline tenderness. Hematological/Lymphatic/Immunilogical: No  cervical lymphadenopathy. Cardiovascular: Normal rate, regular rhythm.  No murmurs, rubs, or gallops.  Respiratory: Normal respiratory effort without tachypnea nor retractions. Breath sounds are clear and equal bilaterally. No wheezes/rales/rhonchi. Gastrointestinal: Soft and non tender. No rebound. No guarding.  Genitourinary: Deferred Musculoskeletal: Normal range of motion in all extremities.  Swelling noted to the right ankle with small wound on shin. Neurologic:  Normal speech and language. No gross focal neurologic deficits are appreciated.  Skin:  Skin is warm, dry and intact. No rash  noted. Psychiatric: Mood and affect are normal. Speech and behavior are normal. Patient exhibits appropriate insight and judgment.  ____________________________________________    LABS (pertinent positives/negatives)  None  ____________________________________________   EKG  None  ____________________________________________    RADIOLOGY  CT head/cervical spine No acute traumatic findings  ____________________________________________   PROCEDURES  Procedures  ____________________________________________   INITIAL IMPRESSION / ASSESSMENT AND PLAN / ED COURSE  Pertinent labs & imaging results that were available during my care of the patient were reviewed by me and considered in my medical decision making (see chart for details).   Patient presented to the emergency department today because of concerns for fall.  Patient had drank some alcohol today.  Does not remember falling.  Is having some abrasions to his head.  CT head and cervical spine were obtained which were negative.  No other traumatic injuries noted.  Patient has some swelling to his right ankle.  States that this is a known problem and that he is following up with orthopedic surgery about getting hardware removed from that right ankle.  Discussed with patient portance of alcohol cessation importance of continuing to follow-up with physicians.   ____________________________________________   FINAL CLINICAL IMPRESSION(S) / ED DIAGNOSES  Final diagnoses:  Fall, initial encounter  Injury of head, initial encounter  Alcohol use     Note: This dictation was prepared with Dragon dictation. Any transcriptional errors that result from this process are unintentional     Nance Pear, MD 06/25/19 [phone]

## 2019-06-25 NOTE — ED Notes (Signed)
Patient transported to CT 

## 2019-06-25 NOTE — Discharge Instructions (Signed)
Please seek medical attention for any high fevers, chest pain, shortness of breath, change in behavior, persistent vomiting, bloody stool or any other new or concerning symptoms.  

## 2019-07-14 ENCOUNTER — Emergency Department
Admission: EM | Admit: 2019-07-14 | Discharge: 2019-07-14 | Disposition: A | Discharge status: Home / Self Care | Payer: Medicaid Other | Attending: Student | Admitting: Student

## 2019-07-14 ENCOUNTER — Other Ambulatory Visit: Payer: Self-pay

## 2019-07-14 ENCOUNTER — Emergency Department: Payer: Medicaid Other

## 2019-07-14 DIAGNOSIS — R55 Syncope and collapse: Secondary | ICD-10-CM | POA: Insufficient documentation

## 2019-07-14 DIAGNOSIS — T50901A Poisoning by unspecified drugs, medicaments and biological substances, accidental (unintentional), initial encounter: Secondary | ICD-10-CM | POA: Insufficient documentation

## 2019-07-14 DIAGNOSIS — J449 Chronic obstructive pulmonary disease, unspecified: Secondary | ICD-10-CM | POA: Diagnosis not present

## 2019-07-14 DIAGNOSIS — I10 Essential (primary) hypertension: Secondary | ICD-10-CM | POA: Diagnosis not present

## 2019-07-14 DIAGNOSIS — Y908 Blood alcohol level of 240 mg/100 ml or more: Secondary | ICD-10-CM | POA: Insufficient documentation

## 2019-07-14 DIAGNOSIS — F10920 Alcohol use, unspecified with intoxication, uncomplicated: Secondary | ICD-10-CM

## 2019-07-14 DIAGNOSIS — F10120 Alcohol abuse with intoxication, uncomplicated: Secondary | ICD-10-CM | POA: Diagnosis present

## 2019-07-14 DIAGNOSIS — R339 Retention of urine, unspecified: Secondary | ICD-10-CM | POA: Insufficient documentation

## 2019-07-14 DIAGNOSIS — Z87891 Personal history of nicotine dependence: Secondary | ICD-10-CM | POA: Diagnosis not present

## 2019-07-14 LAB — CBC WITH DIFFERENTIAL/PLATELET
Abs Immature Granulocytes: 0.1 10*3/uL — ABNORMAL HIGH (ref 0.00–0.07)
Basophils Absolute: 0.1 10*3/uL (ref 0.0–0.1)
Basophils Relative: 2 %
Eosinophils Absolute: 0.3 10*3/uL (ref 0.0–0.5)
Eosinophils Relative: 4 %
HCT: 51.3 % (ref 39.0–52.0)
Hemoglobin: 17.1 g/dL — ABNORMAL HIGH (ref 13.0–17.0)
Immature Granulocytes: 1 %
Lymphocytes Relative: 35 %
Lymphs Abs: 2.6 10*3/uL (ref 0.7–4.0)
MCH: 31.8 pg (ref 26.0–34.0)
MCHC: 33.3 g/dL (ref 30.0–36.0)
MCV: 95.4 fL (ref 80.0–100.0)
Monocytes Absolute: 0.5 10*3/uL (ref 0.1–1.0)
Monocytes Relative: 6 %
Neutro Abs: 3.9 10*3/uL (ref 1.7–7.7)
Neutrophils Relative %: 52 %
Platelets: 205 10*3/uL (ref 150–400)
RBC: 5.38 MIL/uL (ref 4.22–5.81)
RDW: 15.2 % (ref 11.5–15.5)
WBC: 7.4 10*3/uL (ref 4.0–10.5)
nRBC: 0 % (ref 0.0–0.2)

## 2019-07-14 LAB — URINE DRUG SCREEN, QUALITATIVE (ARMC ONLY)
Amphetamines, Ur Screen: POSITIVE — AB
Barbiturates, Ur Screen: NOT DETECTED
Benzodiazepine, Ur Scrn: NOT DETECTED
Cannabinoid 50 Ng, Ur ~~LOC~~: NOT DETECTED
Cocaine Metabolite,Ur ~~LOC~~: NOT DETECTED
MDMA (Ecstasy)Ur Screen: NOT DETECTED
Methadone Scn, Ur: NOT DETECTED
Opiate, Ur Screen: NOT DETECTED
Phencyclidine (PCP) Ur S: NOT DETECTED
Tricyclic, Ur Screen: NOT DETECTED

## 2019-07-14 LAB — COMPREHENSIVE METABOLIC PANEL
ALT: 29 U/L (ref 0–44)
AST: 42 U/L — ABNORMAL HIGH (ref 15–41)
Albumin: 4 g/dL (ref 3.5–5.0)
Alkaline Phosphatase: 96 U/L (ref 38–126)
Anion gap: 14 (ref 5–15)
BUN: 6 mg/dL (ref 6–20)
CO2: 19 mmol/L — ABNORMAL LOW (ref 22–32)
Calcium: 8.4 mg/dL — ABNORMAL LOW (ref 8.9–10.3)
Chloride: 111 mmol/L (ref 98–111)
Creatinine, Ser: 0.86 mg/dL (ref 0.61–1.24)
GFR calc Af Amer: >60 mL/min (ref >60)
GFR calc non Af Amer: >60 mL/min (ref >60)
Glucose, Bld: 150 mg/dL — ABNORMAL HIGH (ref 70–99)
Potassium: 3.1 mmol/L — ABNORMAL LOW (ref 3.5–5.1)
Sodium: 144 mmol/L (ref 135–145)
Total Bilirubin: 0.8 mg/dL (ref 0.3–1.2)
Total Protein: 8.2 g/dL — ABNORMAL HIGH (ref 6.5–8.1)

## 2019-07-14 LAB — URINALYSIS, COMPLETE (UACMP) WITH MICROSCOPIC
Bilirubin Urine: NEGATIVE
Glucose, UA: NEGATIVE mg/dL
Ketones, ur: NEGATIVE mg/dL
Leukocytes,Ua: NEGATIVE
Nitrite: NEGATIVE
Protein, ur: NEGATIVE mg/dL
Specific Gravity, Urine: 1.002 — ABNORMAL LOW (ref 1.005–1.030)
Squamous Epithelial / HPF: NONE SEEN (ref 0–5)
pH: 6 (ref 5.0–8.0)

## 2019-07-14 LAB — ACETAMINOPHEN LEVEL: Acetaminophen (Tylenol), Serum: <10 ug/mL — ABNORMAL LOW (ref 10–30)

## 2019-07-14 LAB — TROPONIN I (HIGH SENSITIVITY)
Troponin I (High Sensitivity): 14 ng/L (ref <18)
Troponin I (High Sensitivity): 14 ng/L (ref <18)
Troponin I (High Sensitivity): 20 ng/L — ABNORMAL HIGH (ref <18)

## 2019-07-14 LAB — SALICYLATE LEVEL: Salicylate Lvl: <7 mg/dL (ref 2.8–30.0)

## 2019-07-14 LAB — ETHANOL: Alcohol, Ethyl (B): 285 mg/dL — ABNORMAL HIGH (ref <10)

## 2019-07-14 MED ORDER — SODIUM CHLORIDE 0.9 % IV BOLUS
1000.0000 mL | Freq: Once | INTRAVENOUS | Status: AC
  Administered 2019-07-14: 1000 mL via INTRAVENOUS

## 2019-07-14 MED ORDER — ONDANSETRON HCL 4 MG/2ML IJ SOLN
INTRAMUSCULAR | Status: AC
  Administered 2019-07-14: 4 mg via INTRAVENOUS
  Filled 2019-07-14: qty 2

## 2019-07-14 MED ORDER — ONDANSETRON HCL 4 MG PO TABS: 4.0000 mg | ORAL_TABLET | Freq: Once | ORAL | Status: DC

## 2019-07-14 MED ORDER — IOHEXOL 300 MG/ML  SOLN
100.0000 mL | Freq: Once | INTRAMUSCULAR | Status: AC | PRN
  Administered 2019-07-14: 100 mL via INTRAVENOUS

## 2019-07-14 MED ORDER — NALOXONE HCL 2 MG/2ML IJ SOSY
2.0000 mg | PREFILLED_SYRINGE | Freq: Once | INTRAMUSCULAR | Status: AC
  Administered 2019-07-14: 04:00:00 2 mg via INTRAVENOUS
  Filled 2019-07-14: qty 2

## 2019-07-14 MED ORDER — ONDANSETRON HCL 4 MG/2ML IJ SOLN
4.0000 mg | Freq: Once | INTRAMUSCULAR | Status: AC
  Administered 2019-07-14: 03:00:00 4 mg via INTRAVENOUS

## 2019-07-14 MED ORDER — ONDANSETRON 4 MG PO TBDP
ORAL_TABLET | ORAL | Status: AC
  Filled 2019-07-14: qty 1

## 2019-07-14 MED ORDER — ONDANSETRON 4 MG PO TBDP
4.0000 mg | ORAL_TABLET | Freq: Once | ORAL | Status: AC
  Administered 2019-07-14: 4 mg via ORAL

## 2019-07-14 NOTE — ED Notes (Signed)
Pt occasionally yelling out in pain, keeps repeating that his chest hurts and feels like someone stomped on his chest, informed pt again that bystanders performed CPR on him. PT also requesting that staff stay in the room to talk to him, informed him that we will frequently round to his room but cannot stay in to just talk. Encouraged patient to close eyes and lay in comfortable position. Informed him breakfast was ordered and will be here sometime in the next hour or so.

## 2019-07-14 NOTE — ED Notes (Signed)
PT in CT.

## 2019-07-14 NOTE — ED Notes (Signed)
Pt c/o chest pain after narcan administration, pt had CPR performed on him prior to arrival when he was unresponsive.

## 2019-07-14 NOTE — Discharge Instructions (Addendum)
Thank you for letting us take care of you in the emergency department today.   Please continue to take any regular, prescribed medications.   Please follow up with: - A urology doctor (information for one below) for follow up of your urinary symptoms - Your primary care doctor to review your ER visit and follow up on your symptoms.   Please return to the ER for any new or worsening symptoms.

## 2019-07-14 NOTE — ED Notes (Signed)
Given ice water, remains sitting in bed awaiting breakfast.

## 2019-07-14 NOTE — ED Notes (Signed)
Report from [place], [place]

## 2019-07-14 NOTE — ED Notes (Signed)
Pt given cup of water. Pt able to provide about 50 cc of urine in urinal. Pt states that he has difficulty urinating due to prostate issues and takes flowmax. Pt informed that he is going to be discharged and asked if he needed RX for his Flowmax. Pt states that he does not need RX and that his medications are taken care of. Pt states that he does not have way to get home. RN asked if he had someone he could call to give him a ride home, pt states "No, no one will have anything to do with me because I am a drunk". Pt encouraged to try to find a way home.

## 2019-07-14 NOTE — ED Notes (Signed)
RN assisting pt into wheelchair. Pt became very nauseated. Spoke with Dr. Joan Mayans who gave verbal order for ODT Zofran.

## 2019-07-14 NOTE — ED Notes (Signed)
Report to Angela, RN

## 2019-07-14 NOTE — ED Notes (Signed)
Pt verbalizes understanding to pee in urinal to make sure he is able to pee without catheter.

## 2019-07-14 NOTE — ED Notes (Signed)
Pt given more PO fluids as requested. Tolerating well.

## 2019-07-14 NOTE — ED Notes (Signed)
Pt encouraged to try and given [place] a urine sample

## 2019-07-14 NOTE — ED Notes (Signed)
Washed hands and face. Resting in bed, given breakfast and milk.

## 2019-07-14 NOTE — ED Notes (Signed)
Bed alarm on patient, side rails up X2, bed in lowest position. Pt instructed on use of call bell and to hit when needs assistance. Will do frequent rounding on patient. Pt denies needs currently. Verbalizes understanding of need to stay in bed unless staff at side.

## 2019-07-14 NOTE — ED Notes (Signed)
Pt sats 79% on room, pt placed on NRB, however pt not tolerating, Dr. Beather Arbour notified, new order received for Narcan.

## 2019-07-14 NOTE — ED Notes (Signed)
Pt sleeping soundly. o2 sats at 90%. Pt refuses to wear oxygen. NRB mask tied to the stretcher railing and oxygen placed on 15 L for blow by. Sats now 96%. MD aware.

## 2019-07-14 NOTE — ED Triage Notes (Signed)
PT to ED via EMS from [place] parking lot. PT was found down, EMS estimates pt was down 5-10 minutes. CPR and rescue breaths given for unknown amount of time. 2mg  narcan nasally and 2mg  narcan IV. PT is alert at this time. ETOH and unknown narctoic on board.

## 2019-07-14 NOTE — ED Provider Notes (Signed)
7:00 AM Assuming care from Dr. Beather Arbour.    In short, Wayne Burns is a 63 y.o. male with history of alcohol abuse who presents for alcohol intoxication, polysubstance abuse, and an apparent episode of unresponsiveness (bystander CPR performed), responded to Narcan.  Refer to the original H&P for additional details.  Current plan of care is to continue to monitor x 6 hours (last received Narcan at 4:20 AM), if no O2 requirement at that time and once clinically sober, anticipate discharge.  10:20 AM Patient has been observed for 6 hours, he remains clinically stable, no respiratory depression, no hypoxia or oxygen requirement.  He has tolerated PO in the emergency department and is clinically sober.  We will discontinue his Foley, attempt void trial, and plan for discharge (+/- Foley pending void trial) as well as follow up with urology.    Lilia Pro., MD 07/14/19 [phone]

## 2019-07-14 NOTE — ED Provider Notes (Signed)
[facility] Emergency Department Provider Note   ____________________________________________   First MD Initiated Contact with Patient 07/14/19 [phone]     (approximate)  I have reviewed the triage vital signs and the nursing notes.   HISTORY  Chief Complaint Drug Overdose  Level V caveat: Limited by intoxication  HPI Angus Cullipher is a 63 y.o. male brought to the ED via EMS from [place] parking [place].  Patient is a known alcoholic suspected to also have substances on board.  He was walking in the parking lot when he collapsed.  Bystander CPR x5 to 10 minutes.  He was given 2 mg of intranasal Narcan followed by 2 mg IV Narcan and arrives to the ED awake.  Admits to polysubstance use.  Complains of chest pain over his sternum presumably where CPR was performed.       Past Medical History:  Diagnosis Date   COPD (chronic obstructive pulmonary disease) ([place])    Hypertension     There are no active problems to display for this patient.   Past Surgical History:  Procedure Laterality Date   ANKLE SURGERY      Prior to Admission medications   Not on File    Allergies Patient has no known allergies.  No family history on file.  Social History Social History   Tobacco Use   Smoking status: Former Smoker    Packs/day: 0.50    Types: Cigarettes   Smokeless tobacco: Never Used  Substance Use Topics   Alcohol use: Yes    Comment: often   Drug use: Not Currently    Review of Systems  Constitutional: Positive for unresponsive state.  No fever/chills Eyes: No visual changes. ENT: No sore throat. Cardiovascular: Positive for chest pain. Respiratory: Denies shortness of breath. Gastrointestinal: No abdominal pain.  No nausea, no vomiting.  No diarrhea.  No constipation. Genitourinary: Negative for dysuria. Musculoskeletal: Negative for back pain. Skin: Negative for rash. Neurological: Negative for headaches, focal weakness or  numbness. Psychiatric: Positive for polysubstance use.  ____________________________________________   PHYSICAL EXAM:  VITAL SIGNS: ED Triage Vitals [07/14/19 0250]  Enc Vitals Group     BP      Pulse      Resp      Temp      Temp src      SpO2 95 %     Weight      Height      Head Circumference      Peak Flow      Pain Score      Pain Loc      Pain Edu?      Excl. in New Home?     Constitutional: Alert and oriented.  Disheveled appearing and in mild acute distress. Eyes: Conjunctivae are normal. PERRL. EOMI. point pupils. Head: Small line of dried blood stemming from left parietal scalp.  There is no laceration, small abrasion.. Nose: Atraumatic. Mouth/Throat: Mucous membranes are moist.  No dental malocclusion. Neck: No stridor.  No cervical spine tenderness to palpation.  No step-offs or deformities. Cardiovascular: Normal rate, regular rhythm. Grossly normal heart sounds.  Good peripheral circulation. Respiratory: Normal respiratory effort.  No retractions. Lungs CTAB.  Red mark to sternum. Gastrointestinal: Soft and nontender. No distention. No abdominal bruits. No CVA tenderness. Musculoskeletal: No lower extremity tenderness nor edema.  No joint effusions. Neurologic: Alert and oriented to self.  Normal speech and language. No gross focal neurologic deficits are appreciated. MAEx4. Skin:  Skin is  warm, dry and intact. No rash noted. Psychiatric: Intoxicated.  Mood and affect are normal. Speech and behavior are normal.  ____________________________________________   LABS (all labs ordered are listed, but only abnormal results are displayed)  Labs Reviewed  CBC WITH DIFFERENTIAL/PLATELET - Abnormal; Notable for the following components:      Result Value   Hemoglobin 17.1 (*)    Abs Immature Granulocytes 0.10 (*)    All other components within normal limits  COMPREHENSIVE METABOLIC PANEL - Abnormal; Notable for the following components:   Potassium 3.1 (*)    CO2  19 (*)    Glucose, Bld 150 (*)    Calcium 8.4 (*)    Total Protein 8.2 (*)    AST 42 (*)    All other components within normal limits  ETHANOL - Abnormal; Notable for the following components:   Alcohol, Ethyl (B) 285 (*)    All other components within normal limits  ACETAMINOPHEN LEVEL - Abnormal; Notable for the following components:   Acetaminophen (Tylenol), Serum <10 (*)    All other components within normal limits  URINALYSIS, COMPLETE (UACMP) WITH MICROSCOPIC - Abnormal; Notable for the following components:   Color, Urine STRAW (*)    APPearance CLEAR (*)    Specific Gravity, Urine 1.002 (*)    Hgb urine dipstick SMALL (*)    Bacteria, UA RARE (*)    All other components within normal limits  URINE DRUG SCREEN, QUALITATIVE ([facility] ONLY) - Abnormal; Notable for the following components:   Amphetamines, Ur Screen POSITIVE (*)    All other components within normal limits  TROPONIN I (HIGH SENSITIVITY) - Abnormal; Notable for the following components:   Troponin I (High Sensitivity) 20 (*)    All other components within normal limits  SALICYLATE LEVEL  TROPONIN I (HIGH SENSITIVITY)  TROPONIN I (HIGH SENSITIVITY)   ____________________________________________  EKG  ED ECG REPORT I, Irven Ingalsbe J, the attending physician, personally viewed and interpreted this ECG.   Date: 07/14/2019  EKG Time: 0253  Rate: 98  Rhythm: normal EKG, normal sinus rhythm  Axis: Normal  Intervals:nonspecific intraventricular conduction delay  ST&T Change: Nonspecific  ____________________________________________  RADIOLOGY  ED MD interpretation: CT head/cervical spine/chest/abdomen/pelvis unremarkable  Official radiology report(s): Ct Head Wo Contrast  Result Date: 07/14/2019 CLINICAL DATA:  Unresponsive, found down. EXAM: CT HEAD WITHOUT CONTRAST CT CERVICAL SPINE WITHOUT CONTRAST TECHNIQUE: Multidetector CT imaging of the head and cervical spine was performed following the standard  protocol without intravenous contrast. Multiplanar CT image reconstructions of the cervical spine were also generated. COMPARISON:  06/25/2019 FINDINGS: CT HEAD FINDINGS Brain: No acute intracranial abnormality. Specifically, no hemorrhage, hydrocephalus, mass lesion, acute infarction, or significant intracranial injury. Vascular: No hyperdense vessel or unexpected calcification. Skull: No acute calvarial abnormality. Sinuses/Orbits: Visualized paranasal sinuses and mastoids clear. Orbital soft tissues unremarkable. Other: None CT CERVICAL SPINE FINDINGS Alignment: No subluxation Skull base and vertebrae: No acute fracture. No primary bone lesion or focal pathologic process. Soft tissues and spinal canal: No prevertebral fluid or swelling. No visible canal hematoma. Disc levels: Degenerative disc disease changes diffusely, most pronounced at C5-6 and C6-7. Diffuse degenerative facet disease. Upper chest: Negative Other: None IMPRESSION: No acute intracranial abnormality. Cervical spondylosis.  No acute bony abnormality. Electronically Signed   By: Rolm Baptise M.D.   On: 07/14/2019 03:57   Ct Chest W Contrast  Result Date: 07/14/2019 CLINICAL DATA:  Found down, unresponsive EXAM: CT CHEST, ABDOMEN, AND PELVIS WITH CONTRAST TECHNIQUE: Multidetector CT imaging  of the chest, abdomen and pelvis was performed following the standard protocol during bolus administration of intravenous contrast. CONTRAST:  153mL OMNIPAQUE IOHEXOL 300 MG/ML  SOLN COMPARISON:  None. FINDINGS: CT CHEST FINDINGS Cardiovascular: Aortic atherosclerosis and diffuse coronary artery disease. Heart is normal size. Aorta is normal caliber. Mediastinum/Nodes: No mediastinal, hilar, or axillary adenopathy. Esophagus is distended with gas. Small hiatal hernia. Lungs/Pleura: Lungs are clear. No focal airspace opacities or suspicious nodules. No effusions. No effusion or pneumothorax. Musculoskeletal: No acute bony abnormality. Old posterior left rib  fractures. CT ABDOMEN PELVIS FINDINGS Hepatobiliary: Diffuse fatty infiltration. No attic injury or perihepatic hematoma. Gallbladder unremarkable. Pancreas: No focal abnormality or ductal dilatation. Spleen: No splenic injury or perisplenic hematoma. Adrenals/Urinary Tract: No adrenal hemorrhage or renal injury identified. Bladder is unremarkable. Severe bilateral hydronephrosis. Urinary bladder is distended above the umbilicus. No adrenal mass. Nonobstructing stones in the left kidney. Stomach/Bowel: Mild diffuse gaseous distention of bowel, favor ileus. Normal appendix. Vascular/Lymphatic: Aortic atherosclerosis. No enlarged abdominal or pelvic lymph nodes. Reproductive: Central prostate calcifications. Other: No free fluid or free air. Musculoskeletal: No acute bony abnormality. IMPRESSION: No evidence of significant injury in the chest, abdomen or pelvis. Small hiatal hernia. Diffuse coronary artery disease, aortic atherosclerosis. Fatty infiltration of the liver. Diffuse gaseous distention of bowel, likely ileus. Markedly distended urinary bladder above the level of the umbilicus. Severe bilateral hydronephrosis. Left nephrolithiasis. Electronically Signed   By: Rolm Baptise M.D.   On: 07/14/2019 04:00   Ct Cervical Spine Wo Contrast  Result Date: 07/14/2019 CLINICAL DATA:  Unresponsive, found down. EXAM: CT HEAD WITHOUT CONTRAST CT CERVICAL SPINE WITHOUT CONTRAST TECHNIQUE: Multidetector CT imaging of the head and cervical spine was performed following the standard protocol without intravenous contrast. Multiplanar CT image reconstructions of the cervical spine were also generated. COMPARISON:  06/25/2019 FINDINGS: CT HEAD FINDINGS Brain: No acute intracranial abnormality. Specifically, no hemorrhage, hydrocephalus, mass lesion, acute infarction, or significant intracranial injury. Vascular: No hyperdense vessel or unexpected calcification. Skull: No acute calvarial abnormality. Sinuses/Orbits: Visualized  paranasal sinuses and mastoids clear. Orbital soft tissues unremarkable. Other: None CT CERVICAL SPINE FINDINGS Alignment: No subluxation Skull base and vertebrae: No acute fracture. No primary bone lesion or focal pathologic process. Soft tissues and spinal canal: No prevertebral fluid or swelling. No visible canal hematoma. Disc levels: Degenerative disc disease changes diffusely, most pronounced at C5-6 and C6-7. Diffuse degenerative facet disease. Upper chest: Negative Other: None IMPRESSION: No acute intracranial abnormality. Cervical spondylosis.  No acute bony abnormality. Electronically Signed   By: Rolm Baptise M.D.   On: 07/14/2019 03:57   Ct Abdomen Pelvis W Contrast  Result Date: 07/14/2019 CLINICAL DATA:  Found down, unresponsive EXAM: CT CHEST, ABDOMEN, AND PELVIS WITH CONTRAST TECHNIQUE: Multidetector CT imaging of the chest, abdomen and pelvis was performed following the standard protocol during bolus administration of intravenous contrast. CONTRAST:  124mL OMNIPAQUE IOHEXOL 300 MG/ML  SOLN COMPARISON:  None. FINDINGS: CT CHEST FINDINGS Cardiovascular: Aortic atherosclerosis and diffuse coronary artery disease. Heart is normal size. Aorta is normal caliber. Mediastinum/Nodes: No mediastinal, hilar, or axillary adenopathy. Esophagus is distended with gas. Small hiatal hernia. Lungs/Pleura: Lungs are clear. No focal airspace opacities or suspicious nodules. No effusions. No effusion or pneumothorax. Musculoskeletal: No acute bony abnormality. Old posterior left rib fractures. CT ABDOMEN PELVIS FINDINGS Hepatobiliary: Diffuse fatty infiltration. No attic injury or perihepatic hematoma. Gallbladder unremarkable. Pancreas: No focal abnormality or ductal dilatation. Spleen: No splenic injury or perisplenic hematoma. Adrenals/Urinary Tract: No adrenal hemorrhage  or renal injury identified. Bladder is unremarkable. Severe bilateral hydronephrosis. Urinary bladder is distended above the umbilicus. No  adrenal mass. Nonobstructing stones in the left kidney. Stomach/Bowel: Mild diffuse gaseous distention of bowel, favor ileus. Normal appendix. Vascular/Lymphatic: Aortic atherosclerosis. No enlarged abdominal or pelvic lymph nodes. Reproductive: Central prostate calcifications. Other: No free fluid or free air. Musculoskeletal: No acute bony abnormality. IMPRESSION: No evidence of significant injury in the chest, abdomen or pelvis. Small hiatal hernia. Diffuse coronary artery disease, aortic atherosclerosis. Fatty infiltration of the liver. Diffuse gaseous distention of bowel, likely ileus. Markedly distended urinary bladder above the level of the umbilicus. Severe bilateral hydronephrosis. Left nephrolithiasis. Electronically Signed   By: Rolm Baptise M.D.   On: 07/14/2019 04:00    ____________________________________________   PROCEDURES  Procedure(s) performed (including Critical Care):  Procedures  CRITICAL CARE Performed by: Paulette Blanch   Total critical care time: 45 minutes  Critical care time was exclusive of separately billable procedures and treating other patients.  Critical care was necessary to treat or prevent imminent or life-threatening deterioration.  Critical care was time spent personally by me on the following activities: development of treatment plan with patient and/or surrogate as well as nursing, discussions with consultants, evaluation of patient's response to treatment, examination of patient, obtaining history from patient or surrogate, ordering and performing treatments and interventions, ordering and review of laboratory studies, ordering and review of radiographic studies, pulse oximetry and re-evaluation of patient's condition. ____________________________________________   INITIAL IMPRESSION / ASSESSMENT AND PLAN / ED COURSE  As part of my medical decision making, I reviewed the following data within the [place] notes reviewed and  incorporated, Labs reviewed, EKG interpreted, Old chart reviewed, Radiograph reviewed and Notes from prior ED visits     Zohair Lape was evaluated in Emergency Department on 07/14/2019 for the symptoms described in the history of present illness. He was evaluated in the context of the global COVID-19 pandemic, which necessitated consideration that the patient might be at risk for infection with the SARS-CoV-2 virus that causes COVID-19. Institutional protocols and algorithms that pertain to the evaluation of patients at risk for COVID-19 are in a state of rapid change based on information released by regulatory bodies including the CDC and federal and state organizations. These policies and algorithms were followed during the patient's care in the ED.    63 year old male with polysubstance use who collapsed in the parking lot of a local convenience store with bystander CPR.  Arrives to the ED alert after Narcan administration.  Differential diagnosis includes but is not limited to accidental overdose, ICH, AAA, ACS, etc.  Will obtain toxicological lab work and urine.  Given intoxicated state and possible trauma, will obtain CT head/C-spine/chest/abdomen/pelvis.  Initiate IV fluid resuscitation.  Clinical Course as of Jul 13 734  Tue Jul 14, 2019  0409 Transient desaturation; increased with 2L O2 [place]. Will give additional dose of Narcan.   [JS]  0526 Narcan was given with good response.  Updated patient of all test results.  Foley catheter placed with greater than 1 L urine obtained.  Patient does state he has an enlarged prostate.  We will continue to monitor.   [JS]  0539 UDS and repeat troponin noted.    [JS]  [id number] Patient sleeping in no acute distress.  Plan to monitor x6 hours.  If patient is not requiring oxygen by that time and he is sober and ambulatory with steady gait, then I anticipate he may  be discharged home. Care transferred to Dr. Joan Mayans at change of shift.   [JS]    Clinical Course  User Index [JS] Paulette Blanch, MD     ____________________________________________   FINAL CLINICAL IMPRESSION(S) / ED DIAGNOSES  Final diagnoses:  Alcoholic intoxication without complication ([place])  Accidental drug overdose, initial encounter  Urinary retention     ED Discharge Orders    None       Note:  This document was prepared using Dragon voice recognition software and may include unintentional dictation errors.   Paulette Blanch, MD 07/14/19 [phone]

## 2019-07-14 NOTE — ED Notes (Signed)
Spoke with Marya Amsler, Camera operator. Pt given taxi voucher. RN called Ladona Mow for taxi to come pick pt up.

## 2019-07-14 NOTE — ED Notes (Signed)
Pt continuously takes off pulse ox, blood pressure cuff. Pt again asking about water and something to eat informed him that a tray has been ordered.

## 2019-08-04 MED FILL — TRAZODONE 50 MG TABLET: ORAL | 30 days supply | Qty: 60 | Fill #1

## 2019-08-04 MED FILL — TAMSULOSIN 0.4 MG CAPSULE: 90 days supply | Qty: 90 | Fill #1 | Status: AC

## 2019-08-04 MED FILL — AMLODIPINE 10 MG TABLET: 90 days supply | Qty: 90 | Fill #1 | Status: AC

## 2019-08-04 MED FILL — TRAZODONE 50 MG TABLET: 30 days supply | Qty: 60 | Fill #1 | Status: AC

## 2019-08-04 MED FILL — AMLODIPINE 10 MG TABLET: ORAL | 90 days supply | Qty: 90 | Fill #1

## 2019-08-04 MED FILL — TAMSULOSIN 0.4 MG CAPSULE: ORAL | 90 days supply | Qty: 90 | Fill #1

## 2019-08-05 DIAGNOSIS — J438 Other emphysema: Secondary | ICD-10-CM

## 2019-08-05 MED ORDER — SPIRIVA RESPIMAT 1.25 MCG/ACTUATION SOLUTION FOR INHALATION
Freq: Every day | RESPIRATORY_TRACT | 5 refills | 0.00000 days | Status: SS
Start: 2019-08-05 — End: 2020-02-01

## 2019-08-06 ENCOUNTER — Institutional Professional Consult (permissible substitution): Admit: 2019-08-06 | Discharge: 2019-08-07

## 2019-08-06 DIAGNOSIS — R339 Retention of urine, unspecified: Secondary | ICD-10-CM

## 2019-08-06 DIAGNOSIS — F1920 Other psychoactive substance dependence, uncomplicated: Secondary | ICD-10-CM

## 2019-08-06 DIAGNOSIS — B182 Chronic viral hepatitis C: Secondary | ICD-10-CM

## 2019-08-06 DIAGNOSIS — F331 Major depressive disorder, recurrent, moderate: Secondary | ICD-10-CM

## 2019-08-06 DIAGNOSIS — L0390 Cellulitis, unspecified: Secondary | ICD-10-CM

## 2019-08-06 MED ORDER — AMOXICILLIN 875 MG-POTASSIUM CLAVULANATE 125 MG TABLET
ORAL_TABLET | Freq: Two times a day (BID) | ORAL | 0 refills | 7 days | Status: CP
Start: 2019-08-06 — End: 2019-08-12
  Filled 2019-08-07: qty 14, 7d supply, fill #0

## 2019-08-06 MED FILL — SPIRIVA RESPIMAT 1.25 MCG/ACTUATION SOLUTION FOR INHALATION: 30 days supply | Qty: 4 | Fill #0 | Status: AC

## 2019-08-07 ENCOUNTER — Ambulatory Visit: Admit: 2019-08-07 | Discharge: 2019-08-08

## 2019-08-07 DIAGNOSIS — D696 Thrombocytopenia, unspecified: Secondary | ICD-10-CM

## 2019-08-07 DIAGNOSIS — B182 Chronic viral hepatitis C: Secondary | ICD-10-CM

## 2019-08-07 MED FILL — AMOXICILLIN 875 MG-POTASSIUM CLAVULANATE 125 MG TABLET: 7 days supply | Qty: 14 | Fill #0 | Status: AC

## 2019-08-11 ENCOUNTER — Encounter: Admit: 2019-08-11 | Discharge: 2019-08-12 | Disposition: A | Discharge status: Home / Self Care | Payer: MEDICARE

## 2019-08-11 ENCOUNTER — Ambulatory Visit: Admit: 2019-08-11 | Discharge: 2019-08-12

## 2019-08-11 DIAGNOSIS — L98499 Non-pressure chronic ulcer of skin of other sites with unspecified severity: Secondary | ICD-10-CM

## 2019-08-11 DIAGNOSIS — R7982 Elevated C-reactive protein (CRP): Secondary | ICD-10-CM

## 2019-08-11 DIAGNOSIS — I2510 Atherosclerotic heart disease of native coronary artery without angina pectoris: Secondary | ICD-10-CM

## 2019-08-11 DIAGNOSIS — N400 Enlarged prostate without lower urinary tract symptoms: Secondary | ICD-10-CM

## 2019-08-11 DIAGNOSIS — Z955 Presence of coronary angioplasty implant and graft: Secondary | ICD-10-CM

## 2019-08-11 DIAGNOSIS — Z720 Tobacco use: Secondary | ICD-10-CM

## 2019-08-11 DIAGNOSIS — T8469XA Infection and inflammatory reaction due to internal fixation device of other site, initial encounter: Secondary | ICD-10-CM

## 2019-08-11 DIAGNOSIS — Z79899 Other long term (current) drug therapy: Secondary | ICD-10-CM

## 2019-08-11 DIAGNOSIS — L97311 Non-pressure chronic ulcer of right ankle limited to breakdown of skin: Secondary | ICD-10-CM

## 2019-08-11 DIAGNOSIS — D72819 Decreased white blood cell count, unspecified: Secondary | ICD-10-CM

## 2019-08-11 DIAGNOSIS — I10 Essential (primary) hypertension: Secondary | ICD-10-CM

## 2019-08-11 DIAGNOSIS — M86671 Other chronic osteomyelitis, right ankle and foot: Secondary | ICD-10-CM

## 2019-08-11 DIAGNOSIS — F329 Major depressive disorder, single episode, unspecified: Secondary | ICD-10-CM

## 2019-08-11 DIAGNOSIS — T847XXA Infection and inflammatory reaction due to other internal orthopedic prosthetic devices, implants and grafts, initial encounter: Secondary | ICD-10-CM

## 2019-08-11 DIAGNOSIS — R21 Rash and other nonspecific skin eruption: Secondary | ICD-10-CM

## 2019-08-11 DIAGNOSIS — L0390 Cellulitis, unspecified: Secondary | ICD-10-CM

## 2019-08-12 MED ORDER — AMOXICILLIN 875 MG-POTASSIUM CLAVULANATE 125 MG TABLET
ORAL_TABLET | Freq: Two times a day (BID) | ORAL | 0 refills | 7.00000 days | Status: CP
Start: 2019-08-12 — End: 2019-08-12
  Filled 2019-08-13: qty 14, 7d supply, fill #0

## 2019-08-12 MED ORDER — AMOXICILLIN 875 MG-POTASSIUM CLAVULANATE 125 MG TABLET: 1 | tablet | Freq: Two times a day (BID) | 0 refills | 7 days | Status: AC

## 2019-08-12 MED ORDER — AMOXICILLIN 875 MG-POTASSIUM CLAVULANATE 125 MG TABLET: 1 | tablet | Freq: Two times a day (BID) | 0 refills | 7 days | Status: SS

## 2019-08-13 ENCOUNTER — Ambulatory Visit: Admit: 2019-08-13 | Discharge: 2019-08-14 | Attending: Family | Primary: Family

## 2019-08-13 ENCOUNTER — Ambulatory Visit: Admit: 2019-08-13 | Discharge: 2019-08-14 | Attending: Orthopaedic Trauma | Primary: Orthopaedic Trauma

## 2019-08-13 DIAGNOSIS — M25571 Pain in right ankle and joints of right foot: Secondary | ICD-10-CM

## 2019-08-13 DIAGNOSIS — Z01812 Encounter for preprocedural laboratory examination: Secondary | ICD-10-CM

## 2019-08-13 DIAGNOSIS — Z20828 Contact with and (suspected) exposure to other viral communicable diseases: Secondary | ICD-10-CM

## 2019-08-13 DIAGNOSIS — Z967 Presence of other bone and tendon implants: Secondary | ICD-10-CM

## 2019-08-13 DIAGNOSIS — G8929 Other chronic pain: Secondary | ICD-10-CM

## 2019-08-13 DIAGNOSIS — T847XXD Infection and inflammatory reaction due to other internal orthopedic prosthetic devices, implants and grafts, subsequent encounter: Secondary | ICD-10-CM

## 2019-08-13 MED FILL — AMOXICILLIN 875 MG-POTASSIUM CLAVULANATE 125 MG TABLET: 7 days supply | Qty: 14 | Fill #0 | Status: AC

## 2019-08-17 ENCOUNTER — Ambulatory Visit
Admit: 2019-08-17 | Discharge: 2019-08-30 | Disposition: A | Discharge status: Skilled Nursing Facility | Payer: MEDICARE

## 2019-08-17 ENCOUNTER — Encounter
Admit: 2019-08-17 | Discharge: 2019-08-30 | Disposition: A | Discharge status: Skilled Nursing Facility | Payer: MEDICARE

## 2019-08-17 HISTORY — PX: ANKLE HARDWARE REMOVAL: SHX1149

## 2019-08-18 MED ORDER — ENOXAPARIN 40 MG/0.4 ML SUBCUTANEOUS SYRINGE: 40 mg | mL | Freq: Every day | 0 refills | 28 days | Status: AC

## 2019-08-18 MED ORDER — ENOXAPARIN 40 MG/0.4 ML SUBCUTANEOUS SYRINGE: 40 mg | Syringe | Freq: Every day | 0 refills | 0 days | Status: AC

## 2019-08-18 MED ORDER — ONDANSETRON HCL 4 MG TABLET: 4 mg | tablet | Freq: Three times a day (TID) | 0 refills | 4 days | Status: AC

## 2019-08-18 MED ORDER — DOCUSATE SODIUM 100 MG CAPSULE: 100 mg | capsule | Freq: Two times a day (BID) | 0 refills | 14 days | Status: AC

## 2019-08-18 MED ORDER — GABAPENTIN 100 MG CAPSULE: 100 mg | capsule | Freq: Three times a day (TID) | 0 refills | 5 days | Status: AC

## 2019-08-18 MED ORDER — OXYCODONE 5 MG TABLET: tablet | 0 refills | 3 days | Status: AC

## 2019-08-18 MED ORDER — ACETAMINOPHEN 500 MG TABLET: 1000 mg | tablet | Freq: Three times a day (TID) | 0 refills | 14 days | Status: AC

## 2019-08-21 MED ORDER — CEFTAZIDIME 2 GRAM/50 ML IN DEXTROSE (ISO-OSM) INTRAVENOUS PIGGYBACK: 2 g | mL | Freq: Three times a day (TID) | 0 refills | 42 days | Status: AC

## 2019-08-21 MED ORDER — VANCOMYCIN 1.5 GRAM/150 ML IN 0.9 % SODIUM CHLORIDE INTRAVENOUS: 2 g | mL | Freq: Two times a day (BID) | 0 refills | 0 days | Status: AC

## 2019-08-30 MED ORDER — OXYCODONE 5 MG TABLET
ORAL_TABLET | ORAL | 0 refills | 4 days | Status: CP | PRN
Start: 2019-08-30 — End: 2019-09-04

## 2019-08-30 MED ORDER — SERTRALINE 50 MG TABLET
ORAL_TABLET | Freq: Every day | ORAL | 0 refills | 30 days | Status: CP
Start: 2019-08-30 — End: 2019-09-29
  Filled 2019-11-03: qty 30, 30d supply, fill #0

## 2019-09-01 ENCOUNTER — Encounter: Admit: 2019-09-01 | Discharge: 2019-09-01 | Payer: MEDICARE | Attending: Internal Medicine | Primary: Internal Medicine

## 2019-09-01 DIAGNOSIS — Z792 Long term (current) use of antibiotics: Principal | ICD-10-CM

## 2019-09-03 ENCOUNTER — Encounter: Admit: 2019-09-03 | Discharge: 2019-09-04 | Payer: MEDICARE

## 2019-09-04 ENCOUNTER — Ambulatory Visit: Admit: 2019-09-04 | Discharge: 2019-09-04 | Payer: MEDICARE

## 2019-09-07 ENCOUNTER — Telehealth
Admit: 2019-09-07 | Discharge: 2019-09-08 | Payer: MEDICARE | Attending: Addiction (Substance Use Disorder) | Primary: Addiction (Substance Use Disorder)

## 2019-09-08 ENCOUNTER — Encounter: Admit: 2019-09-08 | Discharge: 2019-09-09 | Payer: MEDICARE

## 2019-09-08 DIAGNOSIS — T847XXD Infection and inflammatory reaction due to other internal orthopedic prosthetic devices, implants and grafts, subsequent encounter: Principal | ICD-10-CM

## 2019-09-08 MED ORDER — OXYCODONE 5 MG TABLET
ORAL_TABLET | ORAL | 0 refills | 4 days | Status: CP | PRN
Start: 2019-09-08 — End: 2019-09-13

## 2019-09-10 ENCOUNTER — Encounter: Admit: 2019-09-10 | Discharge: 2019-09-10 | Payer: MEDICARE | Attending: Internal Medicine | Primary: Internal Medicine

## 2019-09-10 DIAGNOSIS — Z793 Long term (current) use of hormonal contraceptives: Principal | ICD-10-CM

## 2019-09-12 ENCOUNTER — Encounter: Admit: 2019-09-12 | Discharge: 2019-09-12 | Payer: MEDICARE | Attending: Internal Medicine | Primary: Internal Medicine

## 2019-09-12 DIAGNOSIS — M8660 Other chronic osteomyelitis, unspecified site: Principal | ICD-10-CM

## 2019-09-13 ENCOUNTER — Encounter: Admit: 2019-09-13 | Discharge: 2019-09-13 | Payer: MEDICARE | Attending: Internal Medicine | Primary: Internal Medicine

## 2019-09-13 DIAGNOSIS — M8660 Other chronic osteomyelitis, unspecified site: Principal | ICD-10-CM

## 2019-09-14 ENCOUNTER — Encounter: Admit: 2019-09-14 | Discharge: 2019-09-15 | Payer: MEDICARE

## 2019-09-17 ENCOUNTER — Encounter: Admit: 2019-09-17 | Discharge: 2019-09-17 | Payer: MEDICARE | Attending: Internal Medicine | Primary: Internal Medicine

## 2019-09-17 DIAGNOSIS — M8660 Other chronic osteomyelitis, unspecified site: Principal | ICD-10-CM

## 2019-09-21 ENCOUNTER — Encounter: Admit: 2019-09-21 | Discharge: 2019-09-22 | Payer: MEDICARE

## 2019-09-21 ENCOUNTER — Encounter: Admit: 2019-09-21 | Discharge: 2019-09-21 | Payer: MEDICARE | Attending: Internal Medicine | Primary: Internal Medicine

## 2019-09-21 DIAGNOSIS — T8579XD Infection and inflammatory reaction due to other internal prosthetic devices, implants and grafts, subsequent encounter: Principal | ICD-10-CM

## 2019-10-13 ENCOUNTER — Encounter: Admit: 2019-10-13 | Discharge: 2019-10-13 | Payer: MEDICARE

## 2019-10-13 ENCOUNTER — Ambulatory Visit: Admit: 2019-10-13 | Discharge: 2019-10-13 | Payer: MEDICARE

## 2019-10-14 ENCOUNTER — Ambulatory Visit: Payer: Medicaid Other

## 2019-10-14 ENCOUNTER — Encounter
Admission: RE | Disposition: A | Discharge status: Home / Self Care | Payer: Self-pay | Source: Ambulatory Visit | Attending: Vascular Surgery

## 2019-10-14 ENCOUNTER — Other Ambulatory Visit (INDEPENDENT_AMBULATORY_CARE_PROVIDER_SITE_OTHER): Payer: Self-pay | Admitting: Vascular Surgery

## 2019-10-14 ENCOUNTER — Ambulatory Visit
Admission: RE | Admit: 2019-10-14 | Discharge: 2019-10-14 | Disposition: A | Discharge status: Home / Self Care | Payer: Medicaid Other | Source: Ambulatory Visit | Attending: Vascular Surgery | Admitting: Vascular Surgery

## 2019-10-14 ENCOUNTER — Other Ambulatory Visit: Payer: Self-pay

## 2019-10-14 DIAGNOSIS — R918 Other nonspecific abnormal finding of lung field: Secondary | ICD-10-CM | POA: Insufficient documentation

## 2019-10-14 DIAGNOSIS — X58XXXA Exposure to other specified factors, initial encounter: Secondary | ICD-10-CM | POA: Insufficient documentation

## 2019-10-14 DIAGNOSIS — T829XXA Unspecified complication of cardiac and vascular prosthetic device, implant and graft, initial encounter: Secondary | ICD-10-CM | POA: Diagnosis present

## 2019-10-14 SURGERY — PICC LINE INSERTION
Anesthesia: Moderate Sedation

## 2019-10-14 MED ORDER — DIPHENHYDRAMINE HCL 50 MG/ML IJ SOLN: 50.0000 mg | Freq: Once | INTRAMUSCULAR | Status: DC | PRN

## 2019-10-14 MED ORDER — FAMOTIDINE 20 MG PO TABS: 40.0000 mg | ORAL_TABLET | Freq: Once | ORAL | Status: DC | PRN

## 2019-10-14 MED ORDER — METHYLPREDNISOLONE SODIUM SUCC 125 MG IJ SOLR: 125.0000 mg | Freq: Once | INTRAMUSCULAR | Status: DC | PRN

## 2019-10-14 MED ORDER — HYDROMORPHONE HCL 1 MG/ML IJ SOLN: 1.0000 mg | Freq: Once | INTRAMUSCULAR | Status: DC | PRN

## 2019-10-14 MED ORDER — ONDANSETRON HCL 4 MG/2ML IJ SOLN: 4.0000 mg | Freq: Four times a day (QID) | INTRAMUSCULAR | Status: DC | PRN

## 2019-10-14 MED ORDER — MIDAZOLAM HCL 2 MG/ML PO SYRP: 8.0000 mg | ORAL_SOLUTION | Freq: Once | ORAL | Status: DC | PRN

## 2019-10-14 MED ORDER — CEFAZOLIN SODIUM-DEXTROSE 2-4 GM/100ML-% IV SOLN: 2.0000 g | Freq: Once | INTRAVENOUS | Status: DC

## 2019-10-14 MED ORDER — SODIUM CHLORIDE 0.9 % IV SOLN: INTRAVENOUS | Status: DC

## 2019-10-14 NOTE — Progress Notes (Signed)
[facility]   Patient called specials today stating his "PICC line was broken". Patient under care at [facility]. He was advised by [facility] to seek medical attention in our ER for further evaluation however the patient did not want to.   Upon arrival, the patient had his PICC line which seemed to have fallen out in its entirety in his pocket. A follow up CXR was negative for any retained material. The access site was clean and dry. No swelling or drainage. Patient was sent home without complication.   Discussed with Dr. Ellis Parents Brylan Dec PA-C 10/14/2019 4:12 PM

## 2019-10-19 ENCOUNTER — Encounter: Admit: 2019-10-19 | Discharge: 2019-10-20 | Payer: MEDICARE

## 2019-10-19 MED ORDER — TAMSULOSIN 0.4 MG CAPSULE
ORAL_CAPSULE | Freq: Every day | ORAL | 3 refills | 90 days | Status: CP
Start: 2019-10-19 — End: ?
  Filled 2019-11-03: qty 90, 90d supply, fill #0

## 2019-10-19 MED ORDER — AMLODIPINE 10 MG TABLET
ORAL_TABLET | Freq: Every day | ORAL | 3 refills | 90.00000 days | Status: CP
Start: 2019-10-19 — End: 2020-10-18
  Filled 2019-11-03: qty 90, 90d supply, fill #0

## 2019-10-19 MED ORDER — TRAZODONE 50 MG TABLET
ORAL_TABLET | Freq: Every evening | ORAL | 3 refills | 90 days | Status: CP
Start: 2019-10-19 — End: 2020-10-18
  Filled 2019-11-03: qty 180, 90d supply, fill #0

## 2019-10-21 DIAGNOSIS — M869 Osteomyelitis, unspecified: Principal | ICD-10-CM

## 2019-10-21 MED ORDER — SULFAMETHOXAZOLE 800 MG-TRIMETHOPRIM 160 MG TABLET: 1 | tablet | Freq: Two times a day (BID) | 2 refills | 30 days | Status: AC

## 2019-10-21 MED ORDER — NAPROXEN 500 MG TABLET
ORAL_TABLET | Freq: Two times a day (BID) | ORAL | 2 refills | 30 days | Status: CP
Start: 2019-10-21 — End: 2020-10-20

## 2019-10-21 MED ORDER — SULFAMETHOXAZOLE 800 MG-TRIMETHOPRIM 160 MG TABLET
ORAL_TABLET | Freq: Two times a day (BID) | ORAL | 2 refills | 30.00000 days | Status: CP
Start: 2019-10-21 — End: 2019-11-20

## 2019-10-22 DIAGNOSIS — J438 Other emphysema: Principal | ICD-10-CM

## 2019-10-22 MED ORDER — SPIRIVA RESPIMAT 1.25 MCG/ACTUATION SOLUTION FOR INHALATION
Freq: Every day | RESPIRATORY_TRACT | 5 refills | 30 days | Status: CP
Start: 2019-10-22 — End: 2020-04-19

## 2019-10-27 DIAGNOSIS — B999 Unspecified infectious disease: Principal | ICD-10-CM

## 2019-10-27 DIAGNOSIS — J438 Other emphysema: Principal | ICD-10-CM

## 2019-10-27 MED ORDER — SULFAMETHOXAZOLE 800 MG-TRIMETHOPRIM 160 MG TABLET
ORAL_TABLET | Freq: Two times a day (BID) | ORAL | 1 refills | 30 days | Status: CP
Start: 2019-10-27 — End: 2019-11-26
  Filled 2019-11-03: qty 60, 30d supply, fill #0

## 2019-10-27 MED ORDER — IPRATROPIUM BROMIDE 17 MCG/ACTUATION HFA AEROSOL INHALER
Freq: Four times a day (QID) | RESPIRATORY_TRACT | 2 refills | 0.00000 days | Status: CP
Start: 2019-10-27 — End: 2020-10-26
  Filled 2019-11-03: qty 12.9, 30d supply, fill #0

## 2019-11-02 DIAGNOSIS — B182 Chronic viral hepatitis C: Principal | ICD-10-CM

## 2019-11-02 MED ORDER — SOFOSBUVIR 400 MG-VELPATASVIR 100 MG TABLET
ORAL_TABLET | Freq: Every day | ORAL | 2 refills | 28 days | Status: CP
Start: 2019-11-02 — End: ?
  Filled 2019-11-12: qty 28, 28d supply, fill #0

## 2019-11-03 MED FILL — AMLODIPINE 10 MG TABLET: 90 days supply | Qty: 90 | Fill #0 | Status: AC

## 2019-11-03 MED FILL — AMOXICILLIN 875 MG-POTASSIUM CLAVULANATE 125 MG TABLET: 30 days supply | Qty: 60 | Fill #1 | Status: AC

## 2019-11-03 MED FILL — AMOXICILLIN 875 MG-POTASSIUM CLAVULANATE 125 MG TABLET: ORAL | 30 days supply | Qty: 60 | Fill #1

## 2019-11-03 MED FILL — TAMSULOSIN 0.4 MG CAPSULE: 90 days supply | Qty: 90 | Fill #0 | Status: AC

## 2019-11-03 MED FILL — SERTRALINE 50 MG TABLET: 30 days supply | Qty: 30 | Fill #0 | Status: AC

## 2019-11-03 MED FILL — SULFAMETHOXAZOLE 800 MG-TRIMETHOPRIM 160 MG TABLET: 30 days supply | Qty: 60 | Fill #0 | Status: AC

## 2019-11-03 MED FILL — ATROVENT HFA 17 MCG/ACTUATION AEROSOL INHALER: 30 days supply | Qty: 13 | Fill #0 | Status: AC

## 2019-11-03 MED FILL — TRAZODONE 50 MG TABLET: 90 days supply | Qty: 180 | Fill #0 | Status: AC

## 2019-11-10 NOTE — Unmapped (Signed)
Care Management Progress Note  [facility]                 Date of Service:  11/10/2019      Service: Care Management - phone    Purpose of contact: Per PCP request, assistance with care coordination/transporation assistance.     Additional Information/Plan: Mr. [name] had cancelled his infectious disease visit. I informed him of the importance of this visit and let him know Dr. Christell Constant wanted him to attend this as it would help with his plan for Hep C treatment moving forward. He was agreeable to calling and re-scheduling this appointment, and took down the number to call.     Mr. [name] said he had been contacted by [facility], and was expecting a final call from them to set up transportation. I called [employer] and let them know of Mr. [name]'s 1/11 appointment for his liver US.     Mr. [name] plans on re-scheduling infectious disease, going to urology on 2/2, and will go to get liver US on 1/11 if he has medicaid transport and if that's what Dr. Christell Constant wants me to do. He is often hesitant to engage in care and quick to thinking that his appointments are unnecessary /redundant, but is agreeable if the reason for a particular scan/test/visit is explained as it relates to his overall health/goals.     Mr. [name] does have great trust in [facility] providers, and asks do I need to have a phone visit with Dr. Riley Lam family medicine provider in the coming months?     I will defer to MD on timing of next needed [facility] phone follow-up - will be happy to call and schedule if needed. Mr. [name] thanked me for the call and the help with sorting out his upcoming appointments and his transportation needs.     Tyrell Antonio, MSW, LCSW  Care Manager - [facility]  [phone]

## 2019-11-10 NOTE — Unmapped (Signed)
[facility] Specialty Medication Onboarding    Specialty Medication: Architectural technologist  Prior Authorization: Approved   Financial Assistance: No - copay  <$25  Final Copay/Day Supply: $3 / 28 DAYS    Insurance Restrictions: None     Notes to Pharmacist:     The triage team has completed the benefits investigation and has determined that the patient is able to fill this medication at [facility]. Please contact the patient to complete the onboarding or follow up with the prescribing physician as needed.

## 2019-11-10 NOTE — Unmapped (Signed)
[facility]   Patient Onboarding/Medication Counseling    [name] is a 64 y.o. male with Chronic hepatitis C who I am counseling today on initiation of therapy.  I am speaking to the patient.    Was a Nurse, learning disability used for this call? No    Verified patient's date of birth / HIPAA.    Specialty medication(s) to be sent: Infectious Disease: Epclusa      Non-specialty medications/supplies to be sent: none      Medications not needed at this time: none         Epclusa (sofosbuvir and velpatasvir) 400mg /100mg       Medication & Administration     Dosage: Take one tablet by mouth daily.    Administration:  ? Take with or without food.  ? Antacids: Separate the administration of Epclusa and antacids by at least 4 hours.  ? H2 Receptor Antagonist: Administer H2 receptor antagonist doses less than or comparable to famotidine 40 mg twice daily simultaneously or 12 hours apart from [place].   ? Proton Pump Inhibitor (PPI): Epclusa should be administered with food and taken 4 hours before omeprazole max dose of 20mg .      Adherence/Missed dose instructions:  ? Take missed dose as soon as you remember. If it is close to the time of your next dose, skip the missed dose and resume with your next scheduled dose.  ? Do not take extra doses or 2 doses at the same time.  ? Avoid missing doses as it can result in development of resistance.    Goals of Therapy     The goal of therapy is to cure the patient of Hepatitis C. Patients who have undetectable HCV RNA in the serum, as assessed by a sensitive polymerase chain reaction (PCR) assay, ?12 weeks after treatment completion are deemed to have achieved SVR (cure). (www.hcvguidelines.org)    Side Effects & Monitoring Parameters     Common Side Effects:   ? Headache  ? Fatigue     To help minimize some of the side effects: (http://www.velazquez.com/)  ? Stay hydrated by drinking plenty of water and limiting caffeinated beverages such as coffee, tea and soda. ? Do your hardest chores when you have the most energy.  ? Take short naps of 20 minutes or less that are and not too close to bedtime.    The following side effects should be reported to the provider:  ? Signs of liver problems like dark urine, feeling excessively tired, lack of hunger, upset stomach or stomach pain, light-colored stools, throwing up, or yellow skin/eyes.  ? Signs of an allergic reaction, such as rash; hives; itching; red, swollen, blistered, or peeling skin with or without fever. If you have wheezing; tightness in the chest or throat; trouble breathing, swallowing, or talking; unusual hoarseness; or swelling of the mouth, face, lips, tongue, or throat, call 911 or go to the closest emergency department (ED).     Monitoring Parameters: (AASLD-IDSA Guidelines, 2019)    Within 6 months prior to starting treatment:  ? Complete blood count (CBC).  ? International normalized ratio (INR) if clinically indicated.   ? Hepatic function panel: albumin, total and direct bilirubin, ALT, AST, and Alkaline Phosphatase levels.  ? Calculated glomerular filtration rate (eGFR).  ? Pregnancy test within 1 month of starting treatment for females of childbearing age  Any time prior to starting treatment:  ? Test for HCV genotype.  ? Assess for hepatic fibrosis.  ? Quantitative  HCV RNA (HCV viral load).  ? Assess for active HBV coinfection: HBV surface antigen (HBsAg); If HBsAg positive, then should be assessed for whether HBV DNA level meets AASLD criteria for HBV treatment.  ? Assess for evidence of prior HBV infection and immunity: HBV core antibody (anti-HBc) and HBV surface antibody (anti-HBS).   ? Assess for HIV coinfection.  ? Test for the presence of resistance-associated substitutions (RASs) prior to starting treatment if clinically indicated.    Monitoring during treatment:   ? Diabetics should monitor for signs of hypoglycemia.  ? Patients on warfarin should monitor for changes in INR. ? Pregnancy test monthly in females of childbearing age  ? For HBsAg positive patients not already on HBV therapy because baseline HBV DNA level does not meet treatment criteria:   o Initiate prophylactic HBV antiviral therapy OR  o Monitor HBV DNA levels monthly during and immediately after Epclusa therapy.    After treatment to document a cure:   ? Quantitative HCV RNA12 weeks after completion of therapy.    Contraindications, Warnings, & Precautions     ? Hepatitis B reactivation.  ? Bradycardia with amiodarone coadministration: Coadministration not recommended. If unavoidable, patients should have inpatient cardiac monitoring for 48 hours after first Epclusa dose, followed by daily outpatient or self-monitoring of heart rate for at least the first 2 weeks of treatment. Due to the long half-life of amiodarone, cardiac monitoring is also recommended if amiodarone was discontinued just prior to beginning treatment.     Drug/Food Interactions     ? Medication list reviewed in Epic. The patient was instructed to inform the care team before taking any new medications or supplements. No drug interactions identified.  ? Encourage minimizing alcohol intake.  ? Antacids: Separate the administration of Epclusa and antacids by at least 4 hours.  ? H2 Receptor Antagonists: (I.e. famotidine, ranitidine, cimetidine, and nizatidine) Administer H2 receptor antagonist doses less than or comparable to famotidine 40 mg twice daily simultaneously or 12 hours apart from [place].  ? Proton pump inhibitors: (I.e. omeprazole, pantoprazole, esomeprazole, lansoprazole, dexlansoprazole, and rabeprazole)  Not recommended. If medically necessary, Epclusa should be administered with food and taken 4 hours before omeprazole at a maximum dose of 20mg . Other proton pump inhibitors have not been studied. ? HMG-CoA reductase inhibitors: (I.e statins: atorvastatin, rosuvastatin, pravastatin, etc...) monitor for signs of statin toxicity such as myopathy including rhabdomyolysis. Dose reduction of statin may be necessary. Ask patients to self-report potential side effects of increased concentrations such as muscle pain.  o Rosuvastatin dose should not exceed 10mg .  o Atorvastatin : Consider dose reduction if patient's dose is greater than 40mg .  o Pravastatin: no clinically significant interaction.  ? Potential interactions: Clearance of HCV infection with direct acting antivirals may lead to changes in hepatic function, which may impact the safe and effective use of concomitant medications. Frequent monitoring of relevant laboratory parameters (e.g., International Normalized Ratio [INR] in patients taking warfarin, blood glucose levels in diabetic patients) or drug concentrations of concomitant medications such as cytochrome P450 substrates with a narrow therapeutic index (e.g. certain immunosuppressants) is recommended to ensure safe and effective use. Dose adjustments of concomitant medications may be necessary.    Storage, Handling Precautions, & Disposal     ? Store this medication in the original container at room temperature.  ? Store in a dry place. Do not store in a bathroom.  ? Keep all drugs in a safe place. Keep all drugs out of the reach of  children and pets.  ? Disposal: You should NOT have any Epclusa left over to throw away    Current Medications (including OTC/herbals), Comorbidities and Allergies     Current Outpatient Medications   Medication Sig Dispense Refill   ??? amLODIPine (NORVASC) 10 MG tablet Take 1 tablet (10 mg total) by mouth daily. 90 tablet 3   ??? amoxicillin-clavulanate (AUGMENTIN) 875-125 mg per tablet Take 1 tablet by mouth Two (2) times a day. 60 tablet 6   ??? gabapentin (NEURONTIN) 100 MG capsule Take 1 capsule (100 mg total) by mouth Three (3) times a day for 5 days. 15 capsule 0 ??? ipratropium (ATROVENT HFA) 17 mcg/actuation inhaler Inhale 2 puffs Four (4) times a day. 12.9 g 2   ??? naproxen (NAPROSYN) 500 MG tablet Take 1 tablet (500 mg total) by mouth 2 (two) times a day with meals. 60 tablet 2   ??? sertraline (ZOLOFT) 50 MG tablet Take 1 tablet (50 mg total) by mouth daily. 30 tablet 0   ??? sofosbuvir-velpatasvir (EPCLUSA) 400-100 mg tablet Take 1 tablet by mouth daily. 28 tablet 2   ??? sulfamethoxazole-trimethoprim (BACTRIM DS) 800-160 mg per tablet Take 1 tablet (160 mg of trimethoprim total) by mouth Two (2) times a day. 60 tablet 2   ??? sulfamethoxazole-trimethoprim (BACTRIM DS) 800-160 mg per tablet Take 1 tablet (160 mg of trimethoprim total) by mouth Two (2) times a day. 60 tablet 1   ??? tamsulosin (FLOMAX) 0.4 mg capsule Take 1 capsule (0.4 mg total) by mouth daily. 90 capsule 3   ??? traZODone (DESYREL) 50 MG tablet Take 2 tablets (100 mg total) by mouth nightly. 180 tablet 3     No current facility-administered medications for this visit.        No Known Allergies    Patient Active Problem List   Diagnosis   ??? Closed fracture of ankle   ??? Metal bone fixation hardware in place   ??? Depression   ??? Insomnia   ??? Essential hypertension   ??? Pre-operative cardiovascular examination, high risk surgery   ??? Cellulitis   ??? Leg length discrepancy   ??? Thrombocytopenia (CMS-HCC)   ??? Abnormal LFTs (liver function tests)   ??? Hepatitis C virus infection without hepatic coma   ??? Need for prophylactic vaccination against hepatitis A and hepatitis B   ??? Other emphysema (CMS-HCC)   ??? Hydronephrosis, right   ??? Chronic pain of right ankle   ??? Polysubstance dependence (CMS-HCC)   ??? Hardware complicating wound infection (CMS-HCC)       Reviewed and up to date in Epic.    Appropriateness of Therapy     Is medication and dose appropriate based on diagnosis? Yes    Prescription has been clinically reviewed: Yes    Baseline Quality of Life Assessment How many days over the past month did your chronic hepatitis C keep you from your normal activities? 0    Financial Information     Medication Assistance provided: Prior Authorization    Anticipated copay of $3 / 28 DAYS reviewed with patient. Verified delivery address.    Delivery Information     Scheduled delivery date: 11/12/19    Expected start date: 11/12/19    Medication will be delivered via Same Day Courier to the prescription address in [facility].  This shipment will not require a signature.      Explained the services we provide at [facility] and that each month we would  call to set up refills.  Stressed importance of returning phone calls so that we could ensure they receive their medications in time each month.  Informed patient that we should be setting up refills 7-10 days prior to when they will run out of medication.  A pharmacist will reach out to perform a clinical assessment periodically.  Informed patient that a welcome packet and a drug information handout will be sent.      Patient verbalized understanding of the above information as well as how to contact the pharmacy at [phone] option 4 with any questions/concerns.  The pharmacy is open Monday through Friday 8:30am-4:30pm.  A pharmacist is available 24/7 via pager to answer any clinical questions they may have.    Patient Specific Needs     ? Does the patient have any physical, cognitive, or cultural barriers? No    ? Patient prefers to have medications discussed with  Patient     ? Is the patient or caregiver able to read and understand education materials at a high school level or above? Yes    ? Patient's primary language is  English     ? Is the patient high risk? No     ? Does the patient require a Care Management Plan? No     ? Does the patient require physician intervention or other additional services (i.e. nutrition, smoking cessation, social work)? No      Amil Moseman [facility] Specialty Pharmacist

## 2019-11-11 ENCOUNTER — Institutional Professional Consult (permissible substitution)
Admit: 2019-11-11 | Discharge: 2019-11-12 | Attending: Addiction (Substance Use Disorder) | Primary: Addiction (Substance Use Disorder)

## 2019-11-12 MED FILL — SOFOSBUVIR 400 MG-VELPATASVIR 100 MG TABLET: 28 days supply | Qty: 28 | Fill #0 | Status: AC
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
## 2019-11-25 NOTE — Unmapped (Signed)
Addended by: Lenor Derrick on: 11/25/2019 12:11 PM     Modules accepted: Level of Service

## 2019-11-30 ENCOUNTER — Encounter: Admit: 2019-11-30 | Discharge: 2019-12-01 | Payer: MEDICARE | Attending: Family Medicine | Primary: Family Medicine

## 2019-11-30 DIAGNOSIS — F329 Major depressive disorder, single episode, unspecified: Principal | ICD-10-CM

## 2019-11-30 DIAGNOSIS — L0390 Cellulitis, unspecified: Principal | ICD-10-CM

## 2019-11-30 DIAGNOSIS — J438 Other emphysema: Principal | ICD-10-CM

## 2019-11-30 DIAGNOSIS — Z967 Presence of other bone and tendon implants: Principal | ICD-10-CM

## 2019-11-30 MED ORDER — IPRATROPIUM BROMIDE 17 MCG/ACTUATION HFA AEROSOL INHALER
Freq: Four times a day (QID) | RESPIRATORY_TRACT | 2 refills | 30.00000 days | Status: CP
Start: 2019-11-30 — End: 2020-11-29
  Filled 2019-11-30: qty 12.9, 25d supply, fill #0

## 2019-11-30 MED ORDER — AMOXICILLIN 875 MG-POTASSIUM CLAVULANATE 125 MG TABLET
ORAL_TABLET | Freq: Two times a day (BID) | ORAL | 6 refills | 30 days | Status: CP
Start: 2019-11-30 — End: 2019-12-30
  Filled 2019-11-30: qty 60, 30d supply, fill #0

## 2019-11-30 MED ORDER — SERTRALINE 50 MG TABLET
ORAL_TABLET | Freq: Every day | ORAL | 11 refills | 30 days | Status: CP
Start: 2019-11-30 — End: 2019-12-30
  Filled 2019-11-30: qty 30, 30d supply, fill #0

## 2019-11-30 MED FILL — AMOXICILLIN 875 MG-POTASSIUM CLAVULANATE 125 MG TABLET: 30 days supply | Qty: 60 | Fill #0 | Status: AC

## 2019-11-30 MED FILL — ATROVENT HFA 17 MCG/ACTUATION AEROSOL INHALER: 25 days supply | Qty: 13 | Fill #0 | Status: AC

## 2019-11-30 MED FILL — SERTRALINE 50 MG TABLET: 30 days supply | Qty: 30 | Fill #0 | Status: AC

## 2019-11-30 NOTE — Unmapped (Addendum)
Phone Visit Note  This medical encounter was conducted virtually using [email or web address]  TeleHealth protocols.      I have identified myself to the patient and conveyed my credentials to [name]  Patient/proxy has provided verbal consent to proceed with this phone visit.  In case we get disconnected, patient's phone number is [phone] (home)   In case of Emergency, patient's current location: Home: [address]  [place] [address]  Is there someone else in the room? No.       [name] is a 64 y.o. male requesting a telephone visit today regarding the following issues:    Assessment/Plan:      Problem List Items Addressed This Visit        Digestive    Hepatitis C virus infection without hepatic coma- continue epclusa; check labs as planned at [facility] [place] visit  -no hep A or hep B vaccines in chart but he thinks he may have had these; will check into i    Relevant Orders    Comprehensive Metabolic Panel    Hepatitis C RNA, Quantitative, PCR       Respiratory    Other emphysema (CMS-HCC)- mmrc=0 today; refilled prn ipratropium and will revisit spirometry at next visit    Relevant Medications    ipratropium (ATROVENT HFA) 17 mcg/actuation inhaler       Genitourinary    Hydronephrosis, right, Urinary retention with incomplete bladder emptying- continue tamsulosin and urology f/u        Other    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 Edwena Blow - given several months out from infection and most recent inflammatory markers reassuring, ok to stop abx altogether; voicemail left for [name] by Carloyn Jaeger to let him know- will also touch base w/ him about this at our next viist        Depression, Polysubstance dependence (CMS-HCC)- controlled; continue sertraline and ASAP counseling follow up; will also look through his chart re: any prior conversations around MAT for etoh use and discuss potential options at next visit    Relevant Medications    sertraline (ZOLOFT) 50 MG tablet                      Other Visit Diagnoses     Healthcare maintenance    -  Amenable to labs, immunizations and colon ca screening as below; will plan for MAC visit for vaccines and he will do labs and pick up stool card the same day    Relevant Orders    Lipid Panel    Immunochemical Fecal Occult Blood Test (FIT), automated    Tdap vaccine greater than or equal to 7yo IM                  Followup:  For MAC visit as above, then for phone visit w/ me in 1 month to check in on chronic issues and maintain continuity while Dr. Christell Constant is out on leave    I spent 23 minutes on the phone with the patient on the date of service. I spent an additional 5 minutes  on pre- and post-visit activities.     The patient was physically located in [place] or a state in which I am permitted to provide care. The patient and/or parent/guardian understood that s/he may incur co-pays and cost sharing, and agreed to the telemedicine visit. The visit was reasonable and appropriate under the circumstances given the patient's presentation at the time.    The patient and/or parent/guardian has been advised of the potential risks and limitations of this mode of treatment (including, but not limited to, the absence of in-person examination) and has agreed to be treated using telemedicine. The patient's/patient's family's questions regarding telemedicine have been answered.     If the visit was completed in an ambulatory setting, the patient and/or parent/guardian has also been advised to contact their provider???s office for worsening conditions, and seek emergency medical treatment and/or call 911 if the patient deems either necessary.           Subjective:     HPI  Spoke with [name] today to f/u on a few issues today.     1. HCV  Continues on epclusa; started tx 12/28. Tolerating it well; no side effects. Needs CMP and HCV RNA; Dr. Christell Constant ordered in his chart. Canceled his ID appointment on 1/7; has not had a chance to reschedule yet.     2. Urinary retention, hydronephrosis of R kidney on prior renal sono  Planned for urology f/u with Dr. Christell Constant -- will be seeing Dr. Marcy Salvo on 2/2. Continues on tamsulosin.    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 Cultures grew out MRSA. Was on IV vanc. Reports he is on bactrim and augmentin; needs augmentin refilled.     4. Social issues, polysubstance abuse, mood disorder  Continuing to follow with ASAP. Continues on sertraline 50mg  daily.  Has a court date coming up for a DWI. Worried he will be incarcerated and concerned about covid and about how being incarcerated will affect his ability to manage his chronic medical conditions. 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 Wonders if his chronic untreated infection factored into the lapse in judgement that led to his DWI.  [name] phone: [phone] / fax: [phone]    5. Healthcare maintenance  Due for cholesterol, colon ca screening, pneumovax, tdap; amenable to these.    6. COPD  Needs refill of prn ipratropium. MMRC=0 today.     ROS  As per HPI.      HISTORY:  I have reviewed the patient's problem list, current medications, and allergies and have updated/reconciled them as needed.      Objective:     As part of this Telephone Visit, no in-person exam was conducted.

## 2019-11-30 NOTE — Unmapped (Signed)
Telehealth Pre -Visit Note      11/30/19   PCP: [name], MD  Encounter Provider: Ulla Gallo, MD     [name] is a 64 y.o. male    Chief Complaint   Patient presents with   ??? Follow-up     liver/kidney/questions about Hep C med   ??? Medication Refill       COVID-19 Travel Screening:                    Home Vitals  Home BP readings: 128/82    No Known Allergies     Health Maintenance Due   Topic Date Due   ??? COPD Spirometry  Nov 05, 1956   ??? DTaP/Tdap/Td Vaccines (1 - Tdap) 10/02/1977   ??? Pneumococcal Vaccine (1 of 1 - PPSV23) 10/02/1977   ??? Lipid Screening  10/02/1998   ??? FOBT/FIT  10/02/2008   ??? Colonoscopy  10/02/2008   ??? Zoster Vaccines (1 of 2) 10/02/2008       Healthcare Decision Maker:   HCDM (patient stated preference): [name],[name] - Brother - [phone]    HCDM, First Alternate: [name] - Friend [phone]    Current Outpatient Medications on File Prior to Visit   Medication Sig Dispense Refill   ??? amLODIPine (NORVASC) 10 MG tablet Take 1 tablet (10 mg total) by mouth daily. 90 tablet 3   ??? amoxicillin-clavulanate (AUGMENTIN) 875-125 mg per tablet Take 1 tablet by mouth Two (2) times a day. 60 tablet 6   ??? gabapentin (NEURONTIN) 100 MG capsule Take 1 capsule (100 mg total) by mouth Three (3) times a day for 5 days. 15 capsule 0   ??? ipratropium (ATROVENT HFA) 17 mcg/actuation inhaler Inhale 2 puffs Four (4) times a day. 12.9 g 2   ??? naproxen (NAPROSYN) 500 MG tablet Take 1 tablet (500 mg total) by mouth 2 (two) times a day with meals. 60 tablet 2   ??? sertraline (ZOLOFT) 50 MG tablet Take 1 tablet (50 mg total) by mouth daily. 30 tablet 0   ??? sofosbuvir-velpatasvir (EPCLUSA) 400-100 mg tablet Take 1 tablet by mouth daily. 28 tablet 2   ??? [EXPIRED] sulfamethoxazole-trimethoprim (BACTRIM DS) 800-160 mg per tablet Take 1 tablet (160 mg of trimethoprim total) by mouth Two (2) times a day. 60 tablet 2   ??? sulfamethoxazole-trimethoprim (BACTRIM DS) 800-160 mg per tablet Take 1 tablet (160 mg of trimethoprim total) by mouth Two (2) times a day. 60 tablet 1   ??? tamsulosin (FLOMAX) 0.4 mg capsule Take 1 capsule (0.4 mg total) by mouth daily. 90 capsule 3   ??? traZODone (DESYREL) 50 MG tablet Take 2 tablets (100 mg total) by mouth nightly. 180 tablet 3     No current facility-administered medications on file prior to visit.        Refills Requests Reviewed and and requested medications have been pended.    Screeners:           PHQ-9 PHQ-9 TOTAL SCORE   10/19/2019 3                 [facility]  [facility]  [address] [place], [place] [address] ??? Telephone [phone] ??? Fax [phone]  [email or web address]

## 2019-12-01 ENCOUNTER — Encounter: Admit: 2019-12-01 | Discharge: 2019-12-02 | Payer: MEDICARE | Attending: Clinical | Primary: Clinical

## 2019-12-02 NOTE — Unmapped (Signed)
[facility] Clinical Assessment & Refill Coordination Note    [name], DOB: [birth year]  Phone: [phone] (home)     All above HIPAA information was verified with patient.     Was a Nurse, learning disability used for this call? No    Specialty Medication(s):   Infectious Disease: Epclusa     Current Outpatient Medications   Medication Sig Dispense Refill   ??? amLODIPine (NORVASC) 10 MG tablet Take 1 tablet (10 mg total) by mouth daily. 90 tablet 3   ??? amoxicillin-clavulanate (AUGMENTIN) 875-125 mg per tablet Take 1 tablet by mouth Two (2) times a day. 60 tablet 6   ??? gabapentin (NEURONTIN) 100 MG capsule Take 1 capsule (100 mg total) by mouth Three (3) times a day for 5 days. 15 capsule 0   ??? ipratropium (ATROVENT HFA) 17 mcg/actuation inhaler Inhale 2 puffs Four (4) times a day. 12.9 g 2   ??? naproxen (NAPROSYN) 500 MG tablet Take 1 tablet (500 mg total) by mouth 2 (two) times a day with meals. 60 tablet 2   ??? sertraline (ZOLOFT) 50 MG tablet Take 1 tablet (50 mg total) by mouth daily. 30 tablet 11   ??? sofosbuvir-velpatasvir (EPCLUSA) 400-100 mg tablet Take 1 tablet by mouth daily. 28 tablet 2   ??? sulfamethoxazole-trimethoprim (BACTRIM DS) 800-160 mg per tablet Take 1 tablet (160 mg of trimethoprim total) by mouth Two (2) times a day. 60 tablet 1   ??? tamsulosin (FLOMAX) 0.4 mg capsule Take 1 capsule (0.4 mg total) by mouth daily. 90 capsule 3   ??? traZODone (DESYREL) 50 MG tablet Take 2 tablets (100 mg total) by mouth nightly. 180 tablet 3     No current facility-administered medications for this visit.         Changes to medications: Clair reports no changes at this time.    No Known Allergies    Changes to allergies: No    SPECIALTY MEDICATION ADHERENCE     Epclusa 800-160 mg: 7 days of medicine on hand     Medication Adherence    Patient reported X missed doses in the last month: 0  Specialty Medication: Epclusa  Informant: patient  Confirmed plan for next specialty medication refill: delivery by pharmacy          Specialty medication(s) dose(s) confirmed: Regimen is correct and unchanged.     Are there any concerns with adherence? No    Adherence counseling provided? Not needed    CLINICAL MANAGEMENT AND INTERVENTION      Clinical Benefit Assessment:    Do you feel the medicine is effective or helping your condition? Yes    Clinical Benefit counseling provided? Not needed    Adverse Effects Assessment:    Are you experiencing any side effects? No    Are you experiencing difficulty administering your medicine? No    Quality of Life Assessment:    How many days over the past month did your hepatitis C  keep you from your normal activities? For example, brushing your teeth or getting up in the morning. 0    Have you discussed this with your provider? Not needed    Therapy Appropriateness:    Is therapy appropriate? Yes, therapy is appropriate and should be continued    DISEASE/MEDICATION-SPECIFIC INFORMATION      N/A    PATIENT SPECIFIC NEEDS     ? Does the patient have any physical, cognitive, or cultural barriers? No    ?  Is the patient high risk? No     ? Does the patient require a Care Management Plan? No     ? Does the patient require physician intervention or other additional services (i.e. nutrition, smoking cessation, social work)? No      SHIPPING     Specialty Medication(s) to be Shipped:   Infectious Disease: sofosbuvir/velpatasvir    Other medication(s) to be shipped: SMX-TMP     Changes to insurance: No    Delivery Scheduled: Yes, Expected medication delivery date: 12/04/19.     Medication will be delivered via Next Day Courier to the confirmed prescription address in [facility]    The patient will receive a drug information handout for each medication shipped and additional FDA Medication Guides as required.  Verified that patient has previously received a Conservation officer, historic buildings.    All of the patient's questions and concerns have been addressed.    Breck Coons Shared [facility] Specialty Pharmacist

## 2019-12-03 MED FILL — SOFOSBUVIR 400 MG-VELPATASVIR 100 MG TABLET: 28 days supply | Qty: 28 | Fill #1 | Status: AC

## 2019-12-03 MED FILL — SOFOSBUVIR 400 MG-VELPATASVIR 100 MG TABLET: ORAL | 28 days supply | Qty: 28 | Fill #1

## 2019-12-03 MED FILL — SULFAMETHOXAZOLE 800 MG-TRIMETHOPRIM 160 MG TABLET: 30 days supply | Qty: 60 | Fill #1 | Status: AC

## 2019-12-03 MED FILL — SULFAMETHOXAZOLE 800 MG-TRIMETHOPRIM 160 MG TABLET: ORAL | 30 days supply | Qty: 60 | Fill #1

## 2019-12-04 ENCOUNTER — Encounter: Admit: 2019-12-04 | Discharge: 2019-12-05 | Payer: MEDICARE

## 2019-12-04 DIAGNOSIS — N200 Calculus of kidney: Principal | ICD-10-CM

## 2019-12-04 NOTE — Unmapped (Signed)
ERROR

## 2019-12-08 ENCOUNTER — Ambulatory Visit: Admit: 2019-12-08 | Discharge: 2019-12-08 | Payer: MEDICARE

## 2019-12-08 ENCOUNTER — Encounter: Admit: 2019-12-08 | Discharge: 2019-12-08 | Payer: MEDICARE

## 2019-12-08 ENCOUNTER — Ambulatory Visit: Admit: 2019-12-08 | Discharge: 2019-12-08 | Payer: MEDICARE | Attending: Urology | Primary: Urology

## 2019-12-08 DIAGNOSIS — R339 Retention of urine, unspecified: Principal | ICD-10-CM

## 2019-12-08 LAB — COMPREHENSIVE METABOLIC PANEL
ALBUMIN: 4.2 g/dL (ref 3.5–5.0)
ALKALINE PHOSPHATASE: 149 U/L — ABNORMAL HIGH (ref 38–126)
ALT (SGPT): 14 U/L (ref <50)
ANION GAP: 9 mmol/L (ref 7–15)
AST (SGOT): 24 U/L (ref 19–55)
BILIRUBIN TOTAL: 0.4 mg/dL (ref 0.0–1.2)
BLOOD UREA NITROGEN: 16 mg/dL (ref 7–21)
BUN / CREAT RATIO: 16
CALCIUM: 9.4 mg/dL (ref 8.5–10.2)
CHLORIDE: 101 mmol/L (ref 98–107)
CO2: 26 mmol/L (ref 22.0–30.0)
CREATININE: 1.02 mg/dL (ref 0.70–1.30)
EGFR CKD-EPI AA MALE: >90 mL/min/{1.73_m2} (ref >=60)
EGFR CKD-EPI NON-AA MALE: 79 mL/min/{1.73_m2} (ref >=60)
GLUCOSE RANDOM: 94 mg/dL (ref 70–99)
POTASSIUM: 4.5 mmol/L (ref 3.5–5.0)
PROTEIN TOTAL: 8.3 g/dL (ref 6.5–8.3)
SODIUM: 136 mmol/L (ref 135–145)

## 2019-12-08 LAB — LIPID PANEL
CHOLESTEROL/HDL RATIO SCREEN: 3 (ref <5.0)
CHOLESTEROL: 143 mg/dL (ref 100–199)
HDL CHOLESTEROL: 47 mg/dL (ref 40–59)
LDL CHOLESTEROL CALCULATED: 72 mg/dL (ref 60–99)
NON-HDL CHOLESTEROL: 96 mg/dL
VLDL CHOLESTEROL CAL: 24 mg/dL (ref 12–42)

## 2019-12-08 LAB — LDL CHOLESTEROL CALCULATED: Cholesterol.in LDL:MCnc:Pt:Ser/Plas:Qn:Calculated: 72

## 2019-12-08 LAB — EGFR CKD-EPI NON-AA MALE
Glomerular filtration rate/1.73 sq M.predicted.non black:ArVRat:Pt:Ser/Plas/Bld:Qn:Creatinine-based formula (CKD-EPI): 79

## 2019-12-08 NOTE — Unmapped (Signed)
I saw and evaluated the patient, participating in the key portions of the service. I reviewed the resident???s note. I agree with the resident???s findings and plan.     This is a very pleasant 64 y.o. male with history of urinary retention presents for evaluation. The patient has a long standing history of significant urinary retention with residuals over 800-1000cc for at least 6 months. No pain or infections. PVR 891 mL today. Suspect possible stricture vs bladder dysfunction vs bladder outlet obstruction. Normal creatinine. Discussed prior normal PSA and small prostate on exam. He has some moderate right hydronephrosis per renal ultrasound on 7/102020. Discussed that without intervention he may develop permanent kidney damage requiring dialysis in the future. In the short term I recommend CIC vs indwelling catheter. Discussed utility of cysto and VUDS to determine etiology and appropriate intervention. He refuses catheter today, and is aware of the signs and risks of urinary retention including permanent bladder and kidney damage. Directed to return to clinic if unable to void for catheter. However, he is agreeable to cysto and VUDS,     He will return to clinic for cysto and VUDS. We would be happy to see the patient sooner if any problems arise.         Scribe's Attestation: Tyler Mis, MD obtained and performed the history, physical exam and medical decision making elements that were entered into the chart.  Signed by [name], Scribe, on December 08, 2019 at 10:11 AM.    ----------------------------------------------------------------------------------------------------------------------  December 10, 2019 11:28 AM. Documentation assistance provided by the Scribe. I was present during the time the encounter was recorded. The information recorded by the Scribe was done at my direction and has been reviewed and validated by me.    Tyler Plater Elainah Rhyne MD ----------------------------------------------------------------------------------------------------------------------       ANALYSIS AND PLAN:  This is a 64 y.o.-year-old male with a  history of chronic right ankle osteomyelitis, HTN, hepatitis C, and urinary retention who is referred by his primary care physician for the assessment of chronic urinary retention and bilateral hydronephrosis. I reviewed with the patient today his urinary complaints, symptoms and evaluation in the clinic. His postvoid residual in clinic >800cc. On exam, his prostate is small without concerning nodules.  We had an extensive discussion regarding both short term and long-term options of his chronic urinary retention.  We discussed at length the need for acute bladder decompression with either indwelling Foley catheter or teaching CIC.  The patient is adamantly against any type of bladder decompression at this point, and is refusing catheterization.  We reviewed his recent imaging and discussed his severe bilateral hydronephrosis and explained how this can damage his kidneys and even lead to kidney loss and dialysis.  Despite knowing this information he still is refusing any type of bladder decompression today.  Etiology of his urinary retention is still unclear.  His PSA is normal, he has a small prostate on DRE, and CT imaging does not reveal a large prostate.  He could still have bladder outlet obstruction from a stricture or other anatomic abnormality or this could be primary bladder dysfunction.  To further elucidate this we recommended complex voids with cystoscopy.  He was agreeable to proceed with his work-up.       -Complex VUDs next available    Tyler Elders, MD  [address]  [place],  [place] [address]    CHIEF COMPLAINT: History of BPH and bothersome lower urinary tract symptoms here for evaluation.  BRIEF HISTORY OF PRESENT ILLNESS:  [name] is very pleasant 64 y.o.  -year-old male who was recently referred by [name]   for the evaluation of chronic urinary retention with bilateral hydronephrosis.  He has had evidence of distended bladder and hydronephrosis since June 2020 as discovered on ultrasound. Since initial diagnosis, he has since been started on Flomax.  This is his first time presenting to urology. he denies any urinary symptoms including urgency, frequency, feelings of incomplete bladder emptying.  He does report weak stream with small voids.  He does feel that the Flomax was helpful to improve the strength of his stream.  He has no prior history of urinary tract infections. The patient denies a history of dysuria, hematuria or urinary tract infection. He has never had a prostate biopsy in the past. He has no family history of prostate cancer. He does not have a history of sleep apnea or significant snoring. The patient has no history of fevers, chills, nausea, vomiting, chest pain or shortness of breath.  He has been catheterized one time prior during his orthopedic surgery.  Denies history of sexually transmitted infections or urethral infections.  No history of urethral discharge.  He has never had a cystoscopic evaluation or TRUS.  No prior history of back pain or back injury denies paresthesia to his saddle area.     PAST MEDICAL HISTORY:     Past Medical History:   Diagnosis Date   ??? CAD (coronary artery disease)    ??? Hep C w/ coma, chronic (CMS-HCC)    ??? Hypertension        PAST SURGICAL HISTORY:   Past Surgical History:   Procedure Laterality Date   ??? ANKLE FRACTURE SURGERY     ??? ANKLE SURGERY Right    ??? PR INSERTION DRUG IMPLANT DEVICE Right 08/17/2019    Procedure: Insertion, Non-Biodegradable Drug Delivery Implant;  Surgeon: Garen Grams, MD;  Location: MAIN OR [facility];  Service: Orthopedics   ??? PR PART REMV FEMUR/PROX TIB/FIB Right 08/17/2019    Procedure: Part Exc Bone Femur Prox Tibia &/Or Fibula;  Surgeon: Garen Grams, MD;  Location: MAIN OR [facility];  Service: Orthopedics   ??? PR REMOVAL DEEP IMPLANT Right 08/17/2019    Procedure: REMOVE IMPLANT; DEEP;  Surgeon: Garen Grams, MD;  Location: MAIN OR [facility];  Service: Orthopedics       ALLERGIES:    has No Known Allergies.    MEDICATIONS:  Current Outpatient Medications   Medication Sig Dispense Refill   ??? amLODIPine (NORVASC) 10 MG tablet Take 1 tablet (10 mg total) by mouth daily. 90 tablet 3   ??? amoxicillin-clavulanate (AUGMENTIN) 875-125 mg per tablet Take 1 tablet by mouth Two (2) times a day. 60 tablet 6   ??? gabapentin (NEURONTIN) 100 MG capsule Take 1 capsule (100 mg total) by mouth Three (3) times a day for 5 days. 15 capsule 0   ??? ipratropium (ATROVENT HFA) 17 mcg/actuation inhaler Inhale 2 puffs Four (4) times a day. 12.9 g 2   ??? naproxen (NAPROSYN) 500 MG tablet Take 1 tablet (500 mg total) by mouth 2 (two) times a day with meals. 60 tablet 2   ??? sertraline (ZOLOFT) 50 MG tablet Take 1 tablet (50 mg total) by mouth daily. 30 tablet 11   ??? sofosbuvir-velpatasvir (EPCLUSA) 400-100 mg tablet Take 1 tablet by mouth daily. 28 tablet 2   ??? sulfamethoxazole-trimethoprim (BACTRIM DS) 800-160 mg per tablet Take 1 tablet (  160 mg of trimethoprim total) by mouth Two (2) times a day. 60 tablet 1   ??? tamsulosin (FLOMAX) 0.4 mg capsule Take 1 capsule (0.4 mg total) by mouth daily. 90 capsule 3   ??? traZODone (DESYREL) 50 MG tablet Take 2 tablets (100 mg total) by mouth nightly. 180 tablet 3     No current facility-administered medications for this visit.         SOCIAL HISTORY:  Social History     Tobacco Use   ??? Smoking status: Former Smoker     Types: Cigarettes     Quit date: 08/19/2019     Years since quitting: 0.2   ??? Smokeless tobacco: Never Used   Substance Use Topics   ??? Alcohol use: Yes     Alcohol/week: 10.0 standard drinks     Types: 10 Cans of beer per week   ??? Drug use: Never        FAMILY HISTORY:  Negative for prostate cancer disease.  Family History   Problem Relation Age of Onset   ??? Anesthesia problems Neg Hx    ??? Bleeding Disorder Neg Hx        REVIEW OF SYSTEMS:  A 10-system review of systems was completed  by the patient and reviewed by me today.  All pertinent positives  were discussed in history of present illness.    PHYSICAL EXAMINATION:  GENERAL:  In no apparent distress.  HEENT:  Eyes, ears and mouth appeared normal.  LYMPHATICS:  No cervical lymphadenopathy.  LUNGS:  Chest wall excursion symmetric bilaterally. No audible wheezing  ABDOMEN:  Soft, nontender, nondistended.  BACK:  Negative CVA tenderness.     DRE: Prostate enlarged, smooth, soft no nodules  EXTREMITIES:  No lower extremity edema.  SKIN:  No rashes or jaundice.  NEUROLOGIC:  No focal deficits on neurologic exam.    LABORATORY TESTING:      Chemistry        Component Value Date/Time    NA 139 09/21/2019 1145    NA 141 10/18/2011 1650    K 4.2 09/21/2019 1145    K 4.3 10/18/2011 1650    CL 104 09/21/2019 1145    CL 104 10/18/2011 1650    CO2 25.0 09/21/2019 1145    CO2 22 10/18/2011 1650    BUN 15 09/21/2019 1145    BUN 14 10/18/2011 1650    CREATININE 0.80 09/21/2019 1145    CREATININE 1.05 10/18/2011 1650    GLU 102 09/21/2019 1145        Component Value Date/Time    CALCIUM 9.7 09/21/2019 1145    CALCIUM 9.1 10/18/2011 1650    ALKPHOS 128 (H) 08/11/2019 1614    AST 29 08/11/2019 1614    ALT 18 08/11/2019 1614    BILITOT 0.9 08/11/2019 1614        05/15/2019 PSA 0.59    05/04/2019 RUS  IMPRESSION:  -Mildly enlarged liver with heterogeneous echotexture and slightly nodular contour, which likely related to fatty infiltration and/or chronic liver disease. No focal liver lesion identified. Please note that MRI is more sensitive examination for the evaluation of small focal liver lesions.  -Moderate right hydronephrosis, which is not improved with voiding.   -Markedly distended bladder with large 1104 mL postvoid residual.  -Probable nonobstructing 0.5 cm left lower pole renal calculus.  -Mild splenomegaly.    07/14/2019 CT report  IMPRESSION:  No evidence of significant injury in the chest, abdomen or pelvis.  Small hiatal  hernia.  Diffuse coronary artery disease, aortic atherosclerosis.  Fatty infiltration of the liver.  Diffuse gaseous distention of bowel, likely ileus.  Markedly distended urinary bladder above the level of the umbilicus.  Severe bilateral hydronephrosis.  Left nephrolithiasis.    09/04/2019 RUS  FINDINGS:  ??KIDNEYS: Normal size and echogenicity. Moderate hydronephrosis of the right kidney, similar to prior and only minimally improved on the postvoid images. Mild left-sided pelviectasis without hydronephrosis. Echogenic material with associated twinkle artifact noted the right kidney lower pole measuring 0.8 cm, query layering debris versus calculus. Echogenic foci with posterior shadowing in the left kidney measuring up to 0.2 cm.  ??BLADDER: Markedly overdistended with bladder wall thickening. High post void residual volume remaining in the bladder, measuring 925 cc. Left ureteral jet visualized.  ??IMPRESSION:  -Moderate right hydronephrosis, similar to prior and only minimally improved on postvoid images.  ??-Mild left-sided pelviectasis, without hydronephrosis.  ??-Overdistended bladder with large postvoid residual.  ??-Left nephrolithiasis with layering debris versus nonobstructing calculus in the right kidney lower pole.      12/08/2019  AUA symptom score total of 4 with bother of 1  12/08/2019. postvoid residual 891 cc

## 2019-12-08 NOTE — Unmapped (Signed)
You will return for cystoscopy and VUDs next available

## 2019-12-08 NOTE — Unmapped (Signed)
PVR bladder scan 891 ml. Karleen Dolphin LPN

## 2019-12-08 NOTE — Unmapped (Signed)
Care Management Progress Note  [facility]                 Date of Service:  12/08/2019      Service: Care Management - phone    Purpose of contact: Per MD request, letting Ms. Coggin know he should stop his oral antibiotics.     Additional Information/Plan: Mr. Ocallaghan stated he understood and will comply with Dr. Chyrel Masson recommendation.     Tyrell Antonio, MSW, LCSW  Care Manager - [facility]  [phone]

## 2019-12-10 LAB — HEPATITIS C RNA, QUANTITATIVE, PCR

## 2019-12-10 LAB — HCV RNA(IU): Hepatitis C virus RNA:ACnc:Pt:Ser/Plas:Qn:Probe.amp.tar: 12 — ABNORMAL HIGH

## 2019-12-15 NOTE — Unmapped (Signed)
.    Men???s Seeking Safety Group (via WebEx Teams video)  Today???s Topic was ???Harshness vs. Compassion???  12/01/19    Quotation of the Day: ???You yourself, as much as anybody in the entire universe, deserve your love and affection.???  Quote from Buddha    The patient was physically located in [place] in which I am permitted to provide care. The patient understood that he may incur co-pays and cost sharing, and agreed to the telemedicine visit. The visit was completed via video, which was appropriate and reasonable under the circumstances.     Today???s topic asked the group members how harshness (being hard on yourself) is associated with substance abuse.  ???I drank last night, what a loser!  I can???t do anything right.??? Versus ???I know drinking is dangerous, but I did it anyway.  Nest time I feel the urge to drink I???ll do something different like calling someone,???  We also talked about ways that the group members can be more compassionate with themselves if they do relapse or engage in other dangerous behaviours.    We closed by talking about some of the handouts, what the group members got out of the group and any ideas for future topics.     D: Clinician welcomed patients to virtual format of group and reviewed group rules and guidelines. Clinician led check-in, allowing group members to introduce themselves, identify how they are feeling,  share positive coping from the week and talk about whether or not they engaged in any substance use or other unsafe behavior.      S: Patient logged into the group but kept his mic and video muted for the whole group. He was invited to share but chose not to.    O: Patient arrived to group on time but did not participate.      A: Diagnosis of Poly substance abuse remains appropriate.      P: Plan is for the patient to attend individual session with his primary therapist to find out his level of motivation for this group.          Electronically signed by Clarene Duke, LCSW

## 2019-12-17 DIAGNOSIS — F329 Major depressive disorder, single episode, unspecified: Principal | ICD-10-CM

## 2019-12-18 DIAGNOSIS — F329 Major depressive disorder, single episode, unspecified: Principal | ICD-10-CM

## 2019-12-18 MED ORDER — SERTRALINE 50 MG TABLET
ORAL_TABLET | Freq: Every day | ORAL | 11 refills | 30.00000 days | Status: CP
Start: 2019-12-18 — End: 2020-01-17
  Filled 2019-12-21: qty 60, 30d supply, fill #0

## 2019-12-18 MED ORDER — SERTRALINE 50 MG TABLET: 50 mg | tablet | Freq: Every day | 11 refills | 30 days | Status: AC

## 2019-12-21 MED FILL — SERTRALINE 50 MG TABLET: 30 days supply | Qty: 60 | Fill #0 | Status: AC

## 2019-12-22 ENCOUNTER — Encounter: Admit: 2019-12-22 | Discharge: 2019-12-23 | Payer: MEDICARE | Attending: Family Medicine | Primary: Family Medicine

## 2019-12-22 ENCOUNTER — Encounter: Admit: 2019-12-22 | Discharge: 2019-12-23 | Payer: MEDICARE | Attending: Clinical | Primary: Clinical

## 2019-12-22 NOTE — Unmapped (Signed)
Phone Visit Note  This medical encounter was conducted virtually using [email or web address]  TeleHealth protocols.      I have identified myself to the patient and conveyed my credentials to [name]  Patient/proxy has provided verbal consent to proceed with this phone visit.  In case we get disconnected, patient's phone number is [phone] (home)   In case of Emergency, patient's current location: Home: [address]  [place] [address]  Is there someone else in the room? No.       [name] is a 64 y.o. male requesting a telephone visit today regarding the following issues:    Assessment/Plan:      Problem List Items Addressed This Visit        Digestive    Hepatitis C virus infection without hepatic coma - continues on epclusa; labs improving; plan for HBV and HAV vaccination when he is in the office next       Genitourinary    Hydronephrosis, right    Urinary retention- continue workup with urology       Other    Polysubstance dependence (CMS-HCC), Depression - stable; refilled sertraline at BID dosing to reflect how he has been taking it; cont counseling w/ ASAP      Other Visit Diagnoses     Healthcare maintenance      -had pneumococcal and tdap vaccines  -was given FIT card-- plans to return  -will need HAV and HBV vaccination going forward  -discussed pros/cons of statin for primary prevention given ascvd risk of 8.5%; will wait until HCV treatment concluded and revisit at that time  -risks/benefits of PSA testing to screen for prostate cancer discussed today, including data from [facility] trial and European trials; together we decided to hold off on screening for now; he will discuss w/ urology as well            I spent 22 minutes on the phone with the patient on the date of service. I spent an additional 5 minutes on pre- and post-visit activities.     The patient was physically located in [place] or a state in which I am permitted to provide care. The patient and/or parent/guardian understood that s/he may incur co-pays and cost sharing, and agreed to the telemedicine visit. The visit was reasonable and appropriate under the circumstances given the patient's presentation at the time.    The patient and/or parent/guardian has been advised of the potential risks and limitations of this mode of treatment (including, but not limited to, the absence of in-person examination) and has agreed to be treated using telemedicine. The patient's/patient's family's questions regarding telemedicine have been answered.     If the visit was completed in an ambulatory setting, the patient and/or parent/guardian has also been advised to contact their provider???s office for worsening conditions, and seek emergency medical treatment and/or call 911 if the patient deems either necessary.           Subjective:     HPI  Spoke with [name] to follow up on chronic issues today.     1. Urinary retention  Saw urology who rec'd CIC vs indwelling catheter (PVR 891 during visit); he is hesitant to pursue this; decided to check cysto and VUDS and follow up after that.     2. Depression, substance use  Continues on sertraline - 50mg  most days, but sometimes takes one in the morning and one at night if he feels he needs it. Feels this regimen  is working well to control his mood. Continuing to follow with ASAP, reports this is very helpful.     3. HCV  Continues on epclusa. Had labs checked as planned - HCV RNA 12 from 658,524 on last check. CMP normal except alk phos 149. Lipids with ascvd 8.5%. Prior labs - HAV and HBV non-immune.    ROS  As per HPI.      HISTORY:  I have reviewed the patient's problem list, current medications, and allergies and have updated/reconciled them as needed.      Objective:     As part of this Telephone Visit, no in-person exam was conducted.

## 2019-12-22 NOTE — Unmapped (Signed)
Telehealth Pre -Visit Note      12/22/19   PCP: [name], MD  Encounter Provider: Ulla Gallo, MD     Mr. [name] is a 64 y.o. male    Chief Complaint   Patient presents with   ??? Follow-up     Following up on chronic issues.        COVID-19 Travel Screening:    Have you traveled out of [place] within the last 14 days?: No  Do you have any of the following symptoms that are new or worsening: cough, shortness of breath, loss of taste/smell, sore throat, fever or feeling feverish, repeated shaking chills, muscle pain, vomiting, or diarrhea?: No      Have you had close contact with a person with confirmed COVID-19 in the last 21 days before symptoms began?: No     Home Vitals  N/A or Unable to obtain    No Known Allergies     Health Maintenance Due   Topic Date Due   ??? COPD Spirometry  09/20/1956   ??? FOBT/FIT  10/02/2008   ??? Colonoscopy  10/02/2008   ??? Zoster Vaccines (1 of 2) 10/02/2008       Healthcare Decision Maker:   HCDM (patient stated preference): [name],[name] - [phone]    HCDM, First Alternate: Rosaland Lao - Friend [phone]    Current Outpatient Medications on File Prior to Visit   Medication Sig Dispense Refill   ??? amLODIPine (NORVASC) 10 MG tablet Take 1 tablet (10 mg total) by mouth daily. 90 tablet 3   ??? amoxicillin-clavulanate (AUGMENTIN) 875-125 mg per tablet Take 1 tablet by mouth Two (2) times a day. 60 tablet 6   ??? ipratropium (ATROVENT HFA) 17 mcg/actuation inhaler Inhale 2 puffs Four (4) times a day. 12.9 g 2   ??? naproxen (NAPROSYN) 500 MG tablet Take 1 tablet (500 mg total) by mouth 2 (two) times a day with meals. 60 tablet 2   ??? sertraline (ZOLOFT) 50 MG tablet Take 2 tablets (100 mg total) by mouth daily. 60 tablet 11   ??? sofosbuvir-velpatasvir (EPCLUSA) 400-100 mg tablet Take 1 tablet by mouth daily. 28 tablet 2   ??? sulfamethoxazole-trimethoprim (BACTRIM DS) 800-160 mg per tablet Take 1 tablet (160 mg of trimethoprim total) by mouth Two (2) times a day. 60 tablet 1   ??? tamsulosin (FLOMAX) 0.4 mg capsule Take 1 capsule (0.4 mg total) by mouth daily. 90 capsule 3   ??? traZODone (DESYREL) 50 MG tablet Take 2 tablets (100 mg total) by mouth nightly. 180 tablet 3     No current facility-administered medications on file prior to visit.        Refills Requests Reviewed and and requested medications have been pended.    Screeners:           PHQ-9 PHQ-9 TOTAL SCORE   10/19/2019 3                 [facility]  [place] [facility] [place] at [facility]  [address], [place], [place] [address] ??? Telephone [phone] ??? Fax [phone]  [email or web address]

## 2019-12-23 ENCOUNTER — Encounter
Admit: 2019-12-23 | Discharge: 2019-12-24 | Payer: MEDICARE | Attending: Addiction (Substance Use Disorder) | Primary: Addiction (Substance Use Disorder)

## 2019-12-24 NOTE — Unmapped (Signed)
[facility] Refill Coordination Note    Specialty Medication(s) to be Shipped:   Infectious Disease: Epclusa    Other medication(s) to be shipped:      [name], DOB: April 14, 1956  Phone: [phone] (home)       All above HIPAA information was verified with patient.     Was a Nurse, learning disability used for this call? No    Completed refill call assessment today to schedule patient's medication shipment from the [facility] [phone]).       Specialty medication(s) and dose(s) confirmed: Regimen is correct and unchanged.   Changes to medications: Tajah reports no changes at this time.  Changes to insurance: No  Questions for the pharmacist: No    Confirmed patient received Welcome Packet with first shipment. The patient will receive a drug information handout for each medication shipped and additional FDA Medication Guides as required.       DISEASE/MEDICATION-SPECIFIC INFORMATION        N/A    SPECIALTY MEDICATION ADHERENCE     Medication Adherence    Patient reported X missed doses in the last month: 0  Specialty Medication: epclusa  Patient is on additional specialty medications: No  Informant: patient  Confirmed plan for next specialty medication refill: delivery by pharmacy  Refills needed for supportive medications: not needed          Refill Coordination    Has the Patients' Contact Information Changed: No  Is the Shipping Address Different: No           epclusa 400-100 mg: 8 days of medicine on hand         SHIPPING     Shipping address confirmed in Epic.     Delivery Scheduled: Yes, Expected medication delivery date: 2/23.     Medication will be delivered via Next Day Courier to the prescription address in Epic WAM.    Jolene Schimke   [facility] Specialty Technician

## 2019-12-29 ENCOUNTER — Encounter: Admit: 2019-12-29 | Discharge: 2019-12-30 | Payer: MEDICARE | Attending: Clinical | Primary: Clinical

## 2019-12-29 MED FILL — SOFOSBUVIR 400 MG-VELPATASVIR 100 MG TABLET: ORAL | 28 days supply | Qty: 28 | Fill #2

## 2019-12-29 MED FILL — SOFOSBUVIR 400 MG-VELPATASVIR 100 MG TABLET: 28 days supply | Qty: 28 | Fill #2 | Status: AC

## 2019-12-31 ENCOUNTER — Encounter: Admit: 2019-12-31 | Discharge: 2020-01-01 | Payer: MEDICARE

## 2019-12-31 ENCOUNTER — Encounter: Admit: 2019-12-31 | Discharge: 2020-01-01 | Payer: MEDICARE | Attending: Family Medicine | Primary: Family Medicine

## 2020-01-01 ENCOUNTER — Encounter: Admit: 2020-01-01 | Discharge: 2020-01-02 | Payer: MEDICARE | Attending: Professional | Primary: Professional

## 2020-01-01 NOTE — Unmapped (Signed)
[facility]AP VIRTUAL GROUP    Group: Incorporating Wellness in Recovery: Creating a Physical Activity Profile, Safety of Exercise   Date: January 01, 2020  Dx:   1. Alcohol use disorder, severe, in early remission (CMS-HCC)         I spent 90 minutes on the audio/video with the patient. I spent an additional 30 minutes on pre- and post-visit activities.     The patient was physically located in [place] or a state in which I am permitted to provide care. The patient and/or parent/gauardian understood that s/he may incur co-pays and cost sharing, and agreed to the telemedicine visit. The visit was completed via video, which was appropriate and reasonable under the circumstances given the patient's presentation at the time.    The patient and/or parent/guardian has been advised of the potential risks and limitations of this mode of treatment (including, but not limited to, the absence of in-person examination) and has agreed to be treated using telemedicine. The patient's/patient's family's questions regarding telemedicine have been answered.     If the video visit was completed in an ambulatory setting, the patient and/or parent/guardian has also been advised to contact their provider???s office for worsening conditions, and seek emergency medical treatment and/or call 911 if the patient deems either necessary.    Notes:      P- The purpose of Incorporating Wellness into Recovery is to provide a safe, supportive space to not only discuss the benefits of incorporating a healthier lifestyle, but also, to provide psychodynamic approaches surrounding how overall wellness benefits recovery. Group members were encouraged to participate in several discussions, of which today was a continuation surrounding overcoming barriers to being active, creating a physical activity profile, as well as assessing safety in preparation for exercise.    I- Counselor welcomed Kenechukwu into session, inquiring about their overall stability and well-being. He shared of this being his first wellness group attendance, and he was excited to gain knowledge surrounding a plethora of areas in the topic. Following this, Mathieu and other group members participated in collaborative discussion surrounding assessing safety for exercise participation, as well as ways in which to build a physical activity profile. Session ended with focal points to consider relating to exercise goals. Montario contributed minimally through portions of discussion (due to connectivity issues). He did note, in closing, receiving benefit from the contents reviewed, and motivation to try and pursue new tasks surrounding exercise in his typical day to day.       E- Client was receptive of interventions set forth, noting stability at this time in their recovery process. Client discussed how they will incorporate aspect of wellness discussed today into their recovery process. Client was also encouraged to attend group counseling as evidenced by their treatment plan.

## 2020-01-01 NOTE — Unmapped (Signed)
[facility]AP VIRTUAL GROUP    Group: Stages of Change  Date: December 23, 2019  Dx:   1. Alcohol use disorder, severe, in early remission (CMS-HCC)          I spent 90 minutes on the audio/video with the patient. I spent an additional 15 minutes on pre- and post-visit activities.     The patient was physically located in [place] or a state in which I am permitted to provide care. The patient and/or parent/gauardian understood that s/he may incur co-pays and cost sharing, and agreed to the telemedicine visit. The visit was completed via video, which was appropriate and reasonable under the circumstances given the patient's presentation at the time.    The patient and/or parent/guardian has been advised of the potential risks and limitations of this mode of treatment (including, but not limited to, the absence of in-person examination) and has agreed to be treated using telemedicine. The patient's/patient's family's questions regarding telemedicine have been answered.     If the video visit was completed in an ambulatory setting, the patient and/or parent/guardian has also been advised to contact their provider???s office for worsening conditions, and seek emergency medical treatment and/or call 911 if the patient deems either necessary.    Notes:  Boredom and Staying Busy    D: Patients are encouraged to explore the role of boredom in active use and recovery. Clinician used handouts RP 2 and RP 6 (Boredom, Staying Busy) from [employer] to initiate discussion. Discussion points relevant to this topic include: Exploring thoughts and other feelings associated with boredom; Exploring how free time has been a trigger; Developing structure and routine; Looking at introducing safe activities and hobbies into recovery.    A: Patient arrived on time to the group. He participated in check in but did not participate in the discussion. He was respectful of other group members as they shared their experiences.     P: Continue to attend group weekly, as outlined in treatment plan.

## 2020-01-05 ENCOUNTER — Encounter: Admit: 2020-01-05 | Discharge: 2020-01-06 | Payer: MEDICARE | Attending: Clinical | Primary: Clinical

## 2020-01-05 NOTE — Unmapped (Signed)
.    Men???s Seeking Safety Group (via WebEx Teams video)  Today???s Topic was ???When Substances Control You.???  12/22/19    Quotation of the Day:  Not to Laugh, not to Lament, not to judge, but to understand.??? Spinoza    The patient was physically located in [place] in which I am permitted to provide care. The patient understood that he may incur co-pays and cost sharing, and agreed to the telemedicine visit. The visit was completed via video, which was appropriate and reasonable under the circumstances.   The patient and/or parent/guardian has been advised of the potential risks and limitations of this mode of treatment (including, but not limited to, the absence of in-person examination) and has agreed to be treated using telemedicine. The patient's/patient's family's questions regarding telemedicine have been answered.   Several handouts in PDF form were emailed to the group participants several hours prior to group so that they could review or print them out.  The handouts were titled ???Climbing Mount Recovery and Mixed feelings.???     If the video visit was completed in an ambulatory setting, the patient and/or parent/guardian has also been advised to contact their provider???s office for worsening conditions, and seek emergency medical treatment and/or call 911 if the patient deems either necessary.    Today???s topic asked the group members to consider several ways to detach from emotional pain and gain control over their feelings. We explore several ways to ???ground??? themselves to deal with alcohol or drug cravings, self-harm impulses, anger or sadness.    We explored mental grounding, physical grounding and soothing grounding.  Note that grounding can be done anytime, anyplace and anywhere.    We closed by talking about some of the handouts, what the group members got out of the group and any ideas for future topics.     D: Clinician welcomed patients to virtual format of group and reviewed group rules and guidelines. Clinician led check-in, allowing group members to introduce themselves, identify how they are feeling,  share positive coping from the week and talk about whether or not they engaged in any substance use or other unsafe behavior.      S: Patient was new to the group today.  Did the check in then told the group members about his problems with drinking.  As a fairly new member to the group mostly listened.    O: Patient arrived to group on time and participated in check in.  Patient was dressed appropriately and appeared to be able to sign into and negotiate the video platform successfully.    A: Diagnosis of Alcohol Use D/O Remains appropriate.      P: Plan is for the patient to attend the next Seeking Safety group in one week???s time.  Pt will continue to attend group weekly, as outlined in treatment plan.

## 2020-01-05 NOTE — Unmapped (Signed)
Men???s Seeking Safety Group (via WebEx Teams video)  Today???s Topic was ???Healthy Boundaries???  12/29/19    Quotation of the Day: ???Let your heart guide you.  It whispers, so listen closely.???  Sharrell Ku (20th Writer.      The patient was physically located in [place] in which I am permitted to provide care. The patient understood that he may incur co-pays and cost sharing, and agreed to the telemedicine visit. The visit was completed via video, which was appropriate and reasonable under the circumstances     D: Clinician welcomed patients to virtual format of group and reviewed group rules and guidelines. Clinician led check-in, allowing group members to introduce themselves, identify how they are feeling,  share positive coping from the week and talk about whether or not they engaged in any substance use or other unsafe behavior. This Clinical research associate then read the quote of the day and asked each patient to share with the group what the quote meant to them.    The discussion then asked each group member to talk about what the quote of the day meant to them   We then discussed how healthy boundaries can create a sense of ???safety??? for people in early recovery and for people with PTSD.  We explored how boundaries can be flexible so that sometimes saying ???yes??? can be as important as saying no.    We closed by talking about some of the handouts, what the group members got out of the group and any ideas for future topics.     S: Patient was an active participant in today???s group.  Patient states that there has been no use recently.  Spoke about his liver problems.  Patient was asked questions about if he had ever had to set a boundary with anyone.  Patient may not have understood the problem because he just spoke briefly about how thankful he was to be getting help for his alcohol problem.  During check out he thanked the other group members for their support.    O: Patient arrived to group on time and participated in check in.  Patient was appropriate and appeared to be able to sign into and negotiate the video platform successfully.  Patient did not activate his camera but audio was mostly fine.    A: Diagnosis of Poly Substance Use D/O Remains appropriate.      P: Plan is for the patient to attend the next Seeking Safety group in one week???s time.  Pt will continue to attend group weekly, as outlined in treatment plan.        Electronically signed by Clarene Duke, LCSW

## 2020-01-12 ENCOUNTER — Ambulatory Visit: Admit: 2020-01-12 | Discharge: 2020-01-13 | Payer: MEDICARE | Attending: Clinical | Primary: Clinical

## 2020-01-13 ENCOUNTER — Encounter
Admit: 2020-01-13 | Discharge: 2020-01-14 | Payer: MEDICARE | Attending: Addiction (Substance Use Disorder) | Primary: Addiction (Substance Use Disorder)

## 2020-01-15 ENCOUNTER — Encounter: Admit: 2020-01-15 | Discharge: 2020-01-16 | Payer: MEDICARE | Attending: Professional | Primary: Professional

## 2020-01-15 NOTE — Unmapped (Signed)
[facility] ASAP VIRTUAL GROUP    Group: Incorporating Wellness in Recovery: Stages of Change (Wellness), Breaking Behavior Chains  Date: January 15, 2020  Dx:   1. Alcohol use disorder, severe, in early remission (CMS-HCC)         I spent 90 minutes on the audio/video with the patient. I spent an additional 30 minutes on pre- and post-visit activities.     The patient was physically located in [place] or a state in which I am permitted to provide care. The patient and/or parent/gauardian understood that s/he may incur co-pays and cost sharing, and agreed to the telemedicine visit. The visit was completed via video, which was appropriate and reasonable under the circumstances given the patient's presentation at the time.    The patient and/or parent/guardian has been advised of the potential risks and limitations of this mode of treatment (including, but not limited to, the absence of in-person examination) and has agreed to be treated using telemedicine. The patient's/patient's family's questions regarding telemedicine have been answered.     If the video visit was completed in an ambulatory setting, the patient and/or parent/guardian has also been advised to contact their provider???s office for worsening conditions, and seek emergency medical treatment and/or call 911 if the patient deems either necessary.    Notes:      P- The purpose of Incorporating Wellness into Recovery is to provide a safe, supportive space to not only discuss the benefits of incorporating a healthier lifestyle, but also, to provide psychodynamic approaches surrounding how overall wellness benefits recovery. Group members are encouraged to participate in several discussions, of which today consisted of identifying and understanding stages of change within a wellness realm, and identifying ways to break negative behavior chains.         I- Counselor welcomed Titan into session, inquiring about their overall stability and well-being since last group attendance. Tuvia expressed excitement surrounding further health promotion and medication adherence, largely influenced by continued abstaining from alcohol use. Following this, Hamzeh and other members participated in collaborative discussion surrounding understanding and assessing their own stages of change relating to wellness, as well as ways in which to break negative behavior chains. Juniper shared of his maintenance stage of change, ensuring the importance of ongoing health promotion in conjunction with abstaining from alcohol. Session ended with focal points surrounding what was gained in further understanding their specific stage of change. Arvine noted that it was nice to hear from others, in knowing that he is not alone in his ongoing maintenance of desired behavior.         E- Client was receptive of interventions set forth, noting stability at this time in their recovery process. Client discussed how they will incorporate aspect of wellness discussed today into their recovery process. Client was also encouraged to attend group counseling as evidenced by their treatment plan.

## 2020-01-19 ENCOUNTER — Encounter: Admit: 2020-01-19 | Discharge: 2020-01-20 | Payer: MEDICARE | Attending: Clinical | Primary: Clinical

## 2020-01-19 NOTE — Unmapped (Signed)
Men???s Seeking Safety Group (via Web[employer] Teams video)    01/05/20    Today???s Topic was ???Asking for Help???    Quote of the day was ???And the trouble is, if don??? risk anything, you risk even more.???       SW welcomed patients to virtual format of group and reviewed group rules and guidelines. Clinician led check-in, allowing group members to introduce themselves, identify how they are feeling,  share positive coping from the week and talk about whether or not they engaged in any substance use or other unsafe behavior. This Clinical research associate then read the quote of the day and asked each patient to share with the group what the quote meant to them.    The patient was physically located in [place] in which I am permitted to provide care. The patient understood that he may incur co-pays and cost sharing, and agreed to the telemedicine visit. The visit was completed via video, which was appropriate and reasonable under the circumstances    The SW then asked each group member to talk about why they think it???s difficult, especially for men to ask for help, why is asking a crucial skill in early recovery, what happens when you don???t ask for help and how to cope if the other person refuses to help.    We closed by talking about some of the handouts and the ways that people can learn to be more effective in asking for help.     S: Patient was an active participant in today???s group.  Patient states that it is sometimes hard to ask for help because he's not sure what their response will be.  Patient is opening up more in group.  Sometimes seems cheerful and engaged other times might just be listening.  Always thanks the group member during checkout.    O: Patient arrived to group on time and participated in check in.  Patient did not activate his camera during this session.    A: Diagnosis of Alcohol Use D/O Remains appropriate.      P: Plan is for the patient to attend the next Seeking Safety group in one week???s time.  Pt will continue to attend group weekly, as outlined in treatment plan.        Electronically signed by Clarene Duke, LCSW

## 2020-01-19 NOTE — Unmapped (Signed)
[facility]AP VIRTUAL GROUP    Group: Stages of Change  Date: January 13, 2020  Dx:   1. Alcohol use disorder, severe, in early remission (CMS-HCC)          I spent 90 minutes on the audio/video with the patient. I spent an additional 15 minutes on pre- and post-visit activities.     The patient was physically located in [place] or a state in which I am permitted to provide care. The patient and/or parent/gauardian understood that s/he may incur co-pays and cost sharing, and agreed to the telemedicine visit. The visit was completed via video, which was appropriate and reasonable under the circumstances given the patient's presentation at the time.    The patient and/or parent/guardian has been advised of the potential risks and limitations of this mode of treatment (including, but not limited to, the absence of in-person examination) and has agreed to be treated using telemedicine. The patient's/patient's family's questions regarding telemedicine have been answered.     If the video visit was completed in an ambulatory setting, the patient and/or parent/guardian has also been advised to contact their provider???s office for worsening conditions, and seek emergency medical treatment and/or call 911 if the patient deems either necessary.    Notes:  Problematic Substance Use/Process Discussion    D: This session began with an icebreaker activity to build group cohesion. Clinician facilitated open discussion about problematic substance use and addiction. Group members discussed the criteria of substance use disorder, stigma related to addiction, negative consequences related to substance use, how substance use is portrayed in the media, etc. Group processed what their substance use used to look like versus what it currently looks like and talked about what changes they would like to see.     A: Pt arrived on time and participated in check in. He was respectful of others and minimally active throughout the group discussion and activities.     P: Continue weekly Stages of Change group as outlined in individualized Tx Plan

## 2020-01-20 ENCOUNTER — Ambulatory Visit
Admit: 2020-01-20 | Discharge: 2020-01-21 | Payer: MEDICARE | Attending: Addiction (Substance Use Disorder) | Primary: Addiction (Substance Use Disorder)

## 2020-01-20 MED FILL — SERTRALINE 50 MG TABLET: ORAL | 30 days supply | Qty: 60 | Fill #1

## 2020-01-20 MED FILL — SERTRALINE 50 MG TABLET: 30 days supply | Qty: 60 | Fill #1 | Status: AC

## 2020-01-22 ENCOUNTER — Encounter: Admit: 2020-01-22 | Discharge: 2020-01-23 | Payer: MEDICARE | Attending: Professional | Primary: Professional

## 2020-01-22 MED FILL — AMOXICILLIN 875 MG-POTASSIUM CLAVULANATE 125 MG TABLET: 30 days supply | Qty: 60 | Fill #1 | Status: AC

## 2020-01-22 MED FILL — SULFAMETHOXAZOLE 800 MG-TRIMETHOPRIM 160 MG TABLET: ORAL | 30 days supply | Qty: 60 | Fill #0

## 2020-01-22 MED FILL — AMOXICILLIN 875 MG-POTASSIUM CLAVULANATE 125 MG TABLET: ORAL | 30 days supply | Qty: 60 | Fill #1

## 2020-01-22 MED FILL — SULFAMETHOXAZOLE 800 MG-TRIMETHOPRIM 160 MG TABLET: 30 days supply | Qty: 60 | Fill #0 | Status: AC

## 2020-01-22 MED FILL — ATROVENT HFA 17 MCG/ACTUATION AEROSOL INHALER: 25 days supply | Qty: 13 | Fill #1 | Status: AC

## 2020-01-22 MED FILL — ATROVENT HFA 17 MCG/ACTUATION AEROSOL INHALER: RESPIRATORY_TRACT | 25 days supply | Qty: 12.9 | Fill #1

## 2020-01-22 NOTE — Unmapped (Signed)
[place] [facility] [place] ASAP VIRTUAL GROUP    Group: Incorporating Wellness in Recovery:  Stress: The Constant Challenge (Series): Identifying Stress Levels/Key Stressors; Major Life Events and Stress    Date: January 22, 2020  Dx:   1. Alcohol use disorder, severe, in early remission (CMS-HCC)         I spent 90 minutes on the audio/video with the patient. I spent an additional 30 minutes on pre- and post-visit activities.     The patient was physically located in [place] or a state in which I am permitted to provide care. The patient and/or parent/gauardian understood that s/he may incur co-pays and cost sharing, and agreed to the telemedicine visit. The visit was completed via video, which was appropriate and reasonable under the circumstances given the patient's presentation at the time.    The patient and/or parent/guardian has been advised of the potential risks and limitations of this mode of treatment (including, but not limited to, the absence of in-person examination) and has agreed to be treated using telemedicine. The patient's/patient's family's questions regarding telemedicine have been answered.     If the video visit was completed in an ambulatory setting, the patient and/or parent/guardian has also been advised to contact their provider???s office for worsening conditions, and seek emergency medical treatment and/or call 911 if the patient deems either necessary.    Notes:        P- The purpose of Incorporating Wellness into Recovery is to provide a safe, supportive space to not only discuss the benefits of incorporating a healthier lifestyle, but also, to provide psychodynamic approaches surrounding how overall wellness benefits recovery. Group members are encouraged to participate in several discussions, of which today consisted of discussion surrounding understanding daily life stressors, health consequences of excessive stress, as well as major life transitions and stress.      I- Counselor welcomed [name] into session, inquiring about their overall stability and well-being since last group attendance. He noted ongoing stability with aspects of early remission from alcohol use, as well as motivation to learn more about the concepts of stress, given adverse health consequences he has experienced from it. Following this, [name] and other members participated in collaborative discussion surrounding aspects of recognizing and understanding the adverse health consequences of stress, assessing their daily stressors/management of these stressors, as well as honing in on major life transitions. [name] shared of instances in which he manages stress effectively, of which included medication adherence, taking time to talk with loved ones, as well as participating in enjoyable activities. Session ended with ways in which members could implement strategies discussed in further managing their stress for improved wellness.  [name] shared of how he can implement knowledge gained in conjunction with other groups he attends in efforts to further promote stress relief.         E- Client was receptive of interventions set forth, noting stability at this time in their recovery process. Client discussed how they will incorporate aspect of wellness discussed today into their recovery process. Client was also encouraged to attend group counseling as evidenced by their treatment plan.

## 2020-01-26 ENCOUNTER — Encounter: Admit: 2020-01-26 | Discharge: 2020-01-27 | Payer: MEDICARE | Attending: Clinical | Primary: Clinical

## 2020-01-28 ENCOUNTER — Encounter: Admit: 2020-01-28 | Discharge: 2020-01-29 | Payer: MEDICARE | Attending: Family Medicine | Primary: Family Medicine

## 2020-01-28 DIAGNOSIS — Z5329 Procedure and treatment not carried out because of patient's decision for other reasons: Principal | ICD-10-CM

## 2020-01-28 DIAGNOSIS — B182 Chronic viral hepatitis C: Principal | ICD-10-CM

## 2020-01-28 NOTE — Unmapped (Signed)
Called Alven several times at number listed-- phone goes straight to VM- left message

## 2020-01-28 NOTE — Unmapped (Signed)
Telehealth Pre -Visit Note      01/28/20   PCP: [name], MD  Encounter Provider: Ulla Gallo, MD     [name] is a 64 y.o. male    Chief Complaint   Patient presents with   ??? Follow-up       COVID-19 Travel Screening:    Have you traveled out of [place] within the last 14 days?: No  Do you have any of the following symptoms that are new or worsening: cough, shortness of breath, loss of taste/smell, sore throat, fever or feeling feverish, repeated shaking chills, muscle pain, vomiting, or diarrhea?: No      Have you had close contact with a person with confirmed COVID-19 in the last 21 days before symptoms began?: No     Home Vitals  Home Weights: 172lb BP 126/80    No Known Allergies     Health Maintenance Due   Topic Date Due   ??? COPD Spirometry  Never done   ??? Zoster Vaccines (1 of 2) Never done       Healthcare Decision Maker:   HCDM (patient stated preference): TRAVORIS, BUSHEY - Brother - [phone]    HCDM, First Alternate: Rosaland Lao - Friend [phone]    Current Outpatient Medications on File Prior to Visit   Medication Sig Dispense Refill   ??? amLODIPine (NORVASC) 10 MG tablet Take 1 tablet (10 mg total) by mouth daily. 90 tablet 3   ??? amoxicillin-clavulanate (AUGMENTIN) 875-125 mg per tablet Take 1 tablet by mouth Two (2) times a day. 60 tablet 6   ??? ipratropium (ATROVENT HFA) 17 mcg/actuation inhaler Inhale 2 puffs Four (4) times a day. 12.9 g 2   ??? naproxen (NAPROSYN) 500 MG tablet Take 1 tablet (500 mg total) by mouth 2 (two) times a day with meals. 60 tablet 2   ??? sertraline (ZOLOFT) 50 MG tablet Take 2 tablets (100 mg total) by mouth daily. 60 tablet 11   ??? sofosbuvir-velpatasvir (EPCLUSA) 400-100 mg tablet Take 1 tablet by mouth daily. 28 tablet 2   ??? sulfamethoxazole-trimethoprim (BACTRIM DS) 800-160 mg per tablet Take 1 tablet (160 mg of trimethoprim total) by mouth Two (2) times a day. 60 tablet 2   ??? tamsulosin (FLOMAX) 0.4 mg capsule Take 1 capsule (0.4 mg total) by mouth daily. 90 capsule 3   ??? traZODone (DESYREL) 50 MG tablet Take 2 tablets (100 mg total) by mouth nightly. 180 tablet 3   ??? enoxaparin (LOVENOX) 40 mg/0.4 mL Syrg Inject 0.4 mL (40 mg total) under the skin daily for 28 days. 11.2 mL 0   ??? sulfamethoxazole-trimethoprim (BACTRIM DS) 800-160 mg per tablet Take 1 tablet (160 mg of trimethoprim total) by mouth Two (2) times a day for 14 days. 28 tablet 0   ??? sulfamethoxazole-trimethoprim (BACTRIM DS) 800-160 mg per tablet Take 2 tablets (320 mg of trimethoprim total) by mouth Two (2) times a day for 10 days. 40 tablet 0   ??? sulfamethoxazole-trimethoprim (BACTRIM DS) 800-160 mg per tablet Take 1 tablet (160 mg of trimethoprim total) by mouth Two (2) times a day. 60 tablet 1     No current facility-administered medications on file prior to visit.        Refills Requests Reviewed and and requested medications have been pended.    Screeners:           PHQ-9 PHQ-9 TOTAL SCORE   10/19/2019 3  [facility]  [place] [facility] [place] [facility]  [address] [place], [place] [address] ??? Telephone [phone] ??? Fax [phone]  [email or web address]

## 2020-01-29 ENCOUNTER — Encounter: Admit: 2020-01-29 | Discharge: 2020-01-30 | Payer: MEDICARE | Attending: Professional | Primary: Professional

## 2020-01-29 NOTE — Unmapped (Signed)
[place] ASAP VIRTUAL GROUP    Group: Incorporating Wellness in Recovery:  Stress: The Constant Challenge (Series): Daily Hassles & Stress  Date: January 29, 2020  Dx:   1. Alcohol use disorder, severe, in early remission (CMS-HCC)         I spent 90 minutes on the audio/video with the patient. I spent an additional 30 minutes on pre- and post-visit activities.     The patient was physically located in [place] or a state in which I am permitted to provide care. The patient and/or parent/gauardian understood that s/he may incur co-pays and cost sharing, and agreed to the telemedicine visit. The visit was completed via video, which was appropriate and reasonable under the circumstances given the patient's presentation at the time.    The patient and/or parent/guardian has been advised of the potential risks and limitations of this mode of treatment (including, but not limited to, the absence of in-person examination) and has agreed to be treated using telemedicine. The patient's/patient's family's questions regarding telemedicine have been answered.     If the video visit was completed in an ambulatory setting, the patient and/or parent/guardian has also been advised to contact their provider???s office for worsening conditions, and seek emergency medical treatment and/or call 911 if the patient deems either necessary.    Notes:        P- The purpose of Incorporating Wellness into Recovery is to provide a safe, supportive space to not only discuss the benefits of incorporating a healthier lifestyle, but also, to provide psychodynamic approaches surrounding how overall wellness benefits recovery. Group members are encouraged to participate in several discussions, of which today consisted of discussion surrounding understanding daily hassles and time, and how these areas can influence stress.      I- Counselor welcomed [name] into session, inquiring about their overall stability and well-being since last group attendance. [name] noted ongoing management and stability with early remission, as well as adherence to medication management, finding benefit in relief experienced from anxiety discomfort. Following this, [name] and other members participated in collaborative discussion surrounding recognizing and understanding daily hassles, and how such hassles can make impacts on stress throughout each day. [name] spoke extensively surrounding recent events related to shootings across the nation, and how he notices this resonating with his stress levels each day. Session ended with discussion surrounding ways that members could further recognize and manage daily stressors based on the contents of today???s discussion. [name] noted the importance of just talking about things that stress him out, finding benefit in member overlap in other groups he attends.       E- Client was receptive of interventions set forth, noting stability at this time in their recovery process. Client discussed how they will incorporate aspect of wellness discussed today into their recovery process. Client was also encouraged to attend group counseling as evidenced by their treatment plan.

## 2020-01-29 NOTE — Unmapped (Signed)
[facility] Clinical Assessment & Refill Coordination Note    [name], DOB: 13-Sep-1956  Phone: [phone] (home)     All above HIPAA information was verified with patient.     Was a Nurse, learning disability used for this call? No    Specialty Medication(s):   Infectious Disease: Epclusa     Current Outpatient Medications   Medication Sig Dispense Refill   ??? amLODIPine (NORVASC) 10 MG tablet Take 1 tablet (10 mg total) by mouth daily. 90 tablet 3   ??? amoxicillin-clavulanate (AUGMENTIN) 875-125 mg per tablet Take 1 tablet by mouth Two (2) times a day. 60 tablet 6   ??? enoxaparin (LOVENOX) 40 mg/0.4 mL Syrg Inject 0.4 mL (40 mg total) under the skin daily for 28 days. 11.2 mL 0   ??? ipratropium (ATROVENT HFA) 17 mcg/actuation inhaler Inhale 2 puffs Four (4) times a day. 12.9 g 2   ??? naproxen (NAPROSYN) 500 MG tablet Take 1 tablet (500 mg total) by mouth 2 (two) times a day with meals. 60 tablet 2   ??? sertraline (ZOLOFT) 50 MG tablet Take 2 tablets (100 mg total) by mouth daily. 60 tablet 11   ??? sofosbuvir-velpatasvir (EPCLUSA) 400-100 mg tablet Take 1 tablet by mouth daily. 28 tablet 2   ??? sulfamethoxazole-trimethoprim (BACTRIM DS) 800-160 mg per tablet Take 1 tablet (160 mg of trimethoprim total) by mouth Two (2) times a day for 14 days. 28 tablet 0   ??? sulfamethoxazole-trimethoprim (BACTRIM DS) 800-160 mg per tablet Take 2 tablets (320 mg of trimethoprim total) by mouth Two (2) times a day for 10 days. 40 tablet 0   ??? sulfamethoxazole-trimethoprim (BACTRIM DS) 800-160 mg per tablet Take 1 tablet (160 mg of trimethoprim total) by mouth Two (2) times a day. 60 tablet 2   ??? sulfamethoxazole-trimethoprim (BACTRIM DS) 800-160 mg per tablet Take 1 tablet (160 mg of trimethoprim total) by mouth Two (2) times a day. 60 tablet 1   ??? tamsulosin (FLOMAX) 0.4 mg capsule Take 1 capsule (0.4 mg total) by mouth daily. 90 capsule 3   ??? traZODone (DESYREL) 50 MG tablet Take 2 tablets (100 mg total) by mouth nightly. 180 tablet 3     No current facility-administered medications for this visit.         Changes to medications: Mikale reports no changes at this time.    No Known Allergies    Changes to allergies: No    SPECIALTY MEDICATION ADHERENCE     Epclusa 400-100 mg: 7 days of medicine on hand     Medication Adherence    Patient reported X missed doses in the last month: 0  Specialty Medication: Epclusa 400-100 mg  Patient is on additional specialty medications: No  Informant: patient  Confirmed plan for next specialty medication refill: delivery by pharmacy          Specialty medication(s) dose(s) confirmed: Will be done with 12 week course ~02/04/20     Are there any concerns with adherence? No    Adherence counseling provided? Not needed    CLINICAL MANAGEMENT AND INTERVENTION      Clinical Benefit Assessment:    Do you feel the medicine is effective or helping your condition? Yes    Clinical Benefit counseling provided? Not needed    Adverse Effects Assessment:    Are you experiencing any side effects? No    Are you experiencing difficulty administering your medicine? No    Quality of Life Assessment:  How many days over the past month did your Hepatitis C  keep you from your normal activities? For example, brushing your teeth or getting up in the morning. 0    Have you discussed this with your provider? Not needed    Therapy Appropriateness:    Is therapy appropriate? Pharmacist will consult provider - completed 12 week course.  Will confirm therapy completion with provider prior to disenrolling from specialty queue.    DISEASE/MEDICATION-SPECIFIC INFORMATION      For Hepatitis C patients (clinical assessment):  Regimen: Epclusa 400-100 mg 1 tablet daily x 12 weeks    PATIENT SPECIFIC NEEDS     ? Does the patient have any physical, cognitive, or cultural barriers? No    ? Is the patient high risk? No     ? Does the patient require a Care Management Plan? No     ? Does the patient require physician intervention or other additional services (i.e. nutrition, smoking cessation, social work)? No      SHIPPING     Specialty Medication(s) to be Shipped:   Infectious Disease: none    Other medication(s) to be shipped: tamsulosin, amlodipine and trazodone     Changes to insurance: No    Delivery Scheduled: Yes, Expected medication delivery date: 02/02/20.     Medication will be delivered via UPS to the confirmed prescription address in [facility].    The patient will receive a drug information handout for each medication shipped and additional FDA Medication Guides as required.  Verified that patient has previously received a Conservation officer, historic buildings.    All of the patient's questions and concerns have been addressed.    Breck Coons [facility] Specialty Pharmacist

## 2020-02-01 MED FILL — TAMSULOSIN 0.4 MG CAPSULE: ORAL | 90 days supply | Qty: 90 | Fill #1

## 2020-02-01 MED FILL — TAMSULOSIN 0.4 MG CAPSULE: 90 days supply | Qty: 90 | Fill #1 | Status: AC

## 2020-02-01 MED FILL — TRAZODONE 50 MG TABLET: ORAL | 90 days supply | Qty: 180 | Fill #1

## 2020-02-01 MED FILL — AMLODIPINE 10 MG TABLET: 90 days supply | Qty: 90 | Fill #1 | Status: AC

## 2020-02-01 MED FILL — AMLODIPINE 10 MG TABLET: ORAL | 90 days supply | Qty: 90 | Fill #1

## 2020-02-01 MED FILL — TRAZODONE 50 MG TABLET: 90 days supply | Qty: 180 | Fill #1 | Status: AC

## 2020-02-01 NOTE — Unmapped (Signed)
Patient Advice Request - Ancillary Staff [facility] Name and relation (if other than patient): self  Question/Concern for provider (Specific): [name] would like for Dr. Rogelia Boga to give him a call.   Best Contact Time: any  Other info: I offered the patient a phone appointment but it was not until may so the patient wanted to send a message instead.

## 2020-02-01 NOTE — Unmapped (Signed)
This patient has been disenrolled from the [facility] specialty pharmacy services due to therapy completion - expected therapy completion date: 02/05/20.    Kermit Balo  [facility] Specialty Pharmacist

## 2020-02-02 ENCOUNTER — Encounter: Admit: 2020-02-02 | Discharge: 2020-02-03 | Payer: MEDICARE | Attending: Clinical | Primary: Clinical

## 2020-02-03 ENCOUNTER — Ambulatory Visit
Admit: 2020-02-03 | Discharge: 2020-02-04 | Payer: MEDICARE | Attending: Addiction (Substance Use Disorder) | Primary: Addiction (Substance Use Disorder)

## 2020-02-03 MED ORDER — SERTRALINE 100 MG TABLET
ORAL_TABLET | Freq: Every day | ORAL | 11 refills | 30.00000 days | Status: CP
Start: 2020-02-03 — End: 2020-03-04
  Filled 2020-02-08: qty 60, 30d supply, fill #0

## 2020-02-05 ENCOUNTER — Encounter: Admit: 2020-02-05 | Discharge: 2020-02-06 | Payer: MEDICARE | Attending: Professional | Primary: Professional

## 2020-02-08 MED FILL — SERTRALINE 100 MG TABLET: 30 days supply | Qty: 60 | Fill #0 | Status: AC

## 2020-02-09 ENCOUNTER — Encounter: Admit: 2020-02-09 | Discharge: 2020-02-10 | Payer: MEDICARE | Attending: Clinical | Primary: Clinical

## 2020-02-12 ENCOUNTER — Encounter: Admit: 2020-02-12 | Discharge: 2020-02-13 | Payer: MEDICARE | Attending: Professional | Primary: Professional

## 2020-02-16 ENCOUNTER — Encounter: Admit: 2020-02-16 | Discharge: 2020-02-17 | Payer: MEDICARE | Attending: Clinical | Primary: Clinical

## 2020-02-17 ENCOUNTER — Ambulatory Visit
Admit: 2020-02-17 | Discharge: 2020-02-18 | Payer: MEDICARE | Attending: Addiction (Substance Use Disorder) | Primary: Addiction (Substance Use Disorder)

## 2020-02-19 ENCOUNTER — Encounter: Admit: 2020-02-19 | Discharge: 2020-02-20 | Payer: MEDICARE | Attending: Professional | Primary: Professional

## 2020-02-23 ENCOUNTER — Encounter: Admit: 2020-02-23 | Discharge: 2020-02-24 | Payer: MEDICARE | Attending: Clinical | Primary: Clinical

## 2020-02-23 ENCOUNTER — Institutional Professional Consult (permissible substitution): Admit: 2020-02-23 | Discharge: 2020-02-24 | Payer: MEDICARE

## 2020-02-23 DIAGNOSIS — B182 Chronic viral hepatitis C: Principal | ICD-10-CM

## 2020-02-23 DIAGNOSIS — R339 Retention of urine, unspecified: Principal | ICD-10-CM

## 2020-02-23 DIAGNOSIS — F331 Major depressive disorder, recurrent, moderate: Principal | ICD-10-CM

## 2020-02-24 ENCOUNTER — Encounter
Admit: 2020-02-24 | Discharge: 2020-02-25 | Payer: MEDICARE | Attending: Addiction (Substance Use Disorder) | Primary: Addiction (Substance Use Disorder)

## 2020-03-01 ENCOUNTER — Encounter: Admit: 2020-03-01 | Discharge: 2020-03-02 | Payer: MEDICARE | Attending: Clinical | Primary: Clinical

## 2020-03-02 MED FILL — SERTRALINE 100 MG TABLET: 30 days supply | Qty: 60 | Fill #1 | Status: AC

## 2020-03-02 MED FILL — SERTRALINE 100 MG TABLET: ORAL | 30 days supply | Qty: 60 | Fill #1

## 2020-03-04 ENCOUNTER — Encounter: Admit: 2020-03-04 | Discharge: 2020-03-05 | Payer: MEDICARE | Attending: Professional | Primary: Professional

## 2020-03-04 MED FILL — SERTRALINE 100 MG TABLET: ORAL | 30 days supply | Qty: 60 | Fill #2

## 2020-03-04 MED FILL — SERTRALINE 100 MG TABLET: 30 days supply | Qty: 60 | Fill #2 | Status: AC

## 2020-03-11 ENCOUNTER — Ambulatory Visit: Admit: 2020-03-11 | Discharge: 2020-03-12 | Payer: MEDICARE | Attending: Professional | Primary: Professional

## 2020-03-15 ENCOUNTER — Ambulatory Visit: Admit: 2020-03-15 | Discharge: 2020-03-16 | Payer: MEDICARE | Attending: Clinical | Primary: Clinical

## 2020-03-16 ENCOUNTER — Encounter: Admit: 2020-03-16 | Discharge: 2020-03-17 | Payer: MEDICARE | Attending: Clinical | Primary: Clinical

## 2020-03-18 ENCOUNTER — Encounter: Admit: 2020-03-18 | Discharge: 2020-03-19 | Payer: MEDICARE | Attending: Professional | Primary: Professional

## 2020-03-22 ENCOUNTER — Encounter: Admit: 2020-03-22 | Discharge: 2020-03-23 | Payer: MEDICARE | Attending: Clinical | Primary: Clinical

## 2020-03-23 ENCOUNTER — Encounter: Admit: 2020-03-23 | Discharge: 2020-03-24 | Payer: MEDICARE | Attending: Clinical | Primary: Clinical

## 2020-03-25 ENCOUNTER — Ambulatory Visit: Admit: 2020-03-25 | Discharge: 2020-03-26 | Payer: MEDICARE | Attending: Professional | Primary: Professional

## 2020-03-28 MED FILL — AMOXICILLIN 875 MG-POTASSIUM CLAVULANATE 125 MG TABLET: 30 days supply | Qty: 60 | Fill #2 | Status: AC

## 2020-03-28 MED FILL — SERTRALINE 100 MG TABLET: 30 days supply | Qty: 60 | Fill #3 | Status: AC

## 2020-03-28 MED FILL — SULFAMETHOXAZOLE 800 MG-TRIMETHOPRIM 160 MG TABLET: ORAL | 30 days supply | Qty: 60 | Fill #1

## 2020-03-28 MED FILL — SULFAMETHOXAZOLE 800 MG-TRIMETHOPRIM 160 MG TABLET: 30 days supply | Qty: 60 | Fill #1 | Status: AC

## 2020-03-28 MED FILL — ATROVENT HFA 17 MCG/ACTUATION AEROSOL INHALER: 25 days supply | Qty: 13 | Fill #2 | Status: AC

## 2020-03-28 MED FILL — ATROVENT HFA 17 MCG/ACTUATION AEROSOL INHALER: RESPIRATORY_TRACT | 25 days supply | Qty: 12.9 | Fill #2

## 2020-03-28 MED FILL — AMOXICILLIN 875 MG-POTASSIUM CLAVULANATE 125 MG TABLET: ORAL | 30 days supply | Qty: 60 | Fill #2

## 2020-03-28 MED FILL — SERTRALINE 100 MG TABLET: ORAL | 30 days supply | Qty: 60 | Fill #3

## 2020-03-29 ENCOUNTER — Encounter: Admit: 2020-03-29 | Discharge: 2020-03-30 | Payer: MEDICARE | Attending: Clinical | Primary: Clinical

## 2020-04-05 ENCOUNTER — Encounter: Admit: 2020-04-05 | Discharge: 2020-04-06 | Payer: MEDICARE | Attending: Clinical | Primary: Clinical

## 2020-04-08 ENCOUNTER — Ambulatory Visit: Admit: 2020-04-08 | Discharge: 2020-04-09 | Payer: MEDICARE | Attending: Professional | Primary: Professional

## 2020-04-12 ENCOUNTER — Ambulatory Visit: Admit: 2020-04-12 | Discharge: 2020-04-13 | Payer: MEDICARE | Attending: Clinical | Primary: Clinical

## 2020-04-13 ENCOUNTER — Encounter: Admit: 2020-04-13 | Discharge: 2020-04-14 | Payer: MEDICARE | Attending: Clinical | Primary: Clinical

## 2020-04-15 ENCOUNTER — Ambulatory Visit: Admit: 2020-04-15 | Discharge: 2020-04-16 | Payer: MEDICARE | Attending: Professional | Primary: Professional

## 2020-04-15 DIAGNOSIS — J438 Other emphysema: Principal | ICD-10-CM

## 2020-04-15 MED ORDER — ATROVENT HFA 17 MCG/ACTUATION AEROSOL INHALER
Freq: Four times a day (QID) | RESPIRATORY_TRACT | 2 refills | 25 days | Status: CN
Start: 2020-04-15 — End: 2021-04-15

## 2020-04-19 ENCOUNTER — Ambulatory Visit: Admit: 2020-04-19 | Discharge: 2020-04-20 | Payer: MEDICARE | Attending: Clinical | Primary: Clinical

## 2020-04-22 ENCOUNTER — Encounter: Admit: 2020-04-22 | Discharge: 2020-04-23 | Payer: MEDICARE | Attending: Professional | Primary: Professional

## 2020-04-26 ENCOUNTER — Ambulatory Visit
Admit: 2020-04-26 | Discharge: 2020-04-27 | Payer: MEDICARE | Attending: Addiction (Substance Use Disorder) | Primary: Addiction (Substance Use Disorder)

## 2020-04-28 ENCOUNTER — Encounter: Admit: 2020-04-28 | Discharge: 2020-04-29 | Payer: MEDICARE

## 2020-05-03 ENCOUNTER — Telehealth: Payer: Self-pay | Admitting: Family Medicine

## 2020-05-03 ENCOUNTER — Ambulatory Visit: Admit: 2020-05-03 | Discharge: 2020-05-04 | Payer: MEDICARE | Attending: Urology | Primary: Urology

## 2020-05-03 DIAGNOSIS — R339 Retention of urine, unspecified: Principal | ICD-10-CM

## 2020-05-03 NOTE — Telephone Encounter (Signed)
Individual has been contacted 3+ times regarding ED referral. No further attempts will be made in efforts to contact individual.

## 2020-05-13 ENCOUNTER — Ambulatory Visit
Admit: 2020-05-13 | Discharge: 2020-05-14 | Payer: MEDICARE | Attending: Addiction (Substance Use Disorder) | Primary: Addiction (Substance Use Disorder)

## 2020-05-20 ENCOUNTER — Encounter: Admit: 2020-05-20 | Discharge: 2020-05-21 | Payer: MEDICARE | Attending: Professional | Primary: Professional

## 2020-06-03 ENCOUNTER — Encounter: Admit: 2020-06-03 | Discharge: 2020-06-03 | Payer: MEDICARE

## 2020-06-03 ENCOUNTER — Ambulatory Visit: Admit: 2020-06-03 | Discharge: 2020-06-03 | Payer: MEDICARE

## 2020-06-03 DIAGNOSIS — R339 Retention of urine, unspecified: Principal | ICD-10-CM

## 2020-06-10 DIAGNOSIS — I10 Essential (primary) hypertension: Principal | ICD-10-CM

## 2020-06-10 MED ORDER — AMLODIPINE 10 MG TABLET
ORAL_TABLET | 3 refills | 0 days
Start: 2020-06-10 — End: ?

## 2020-06-17 ENCOUNTER — Encounter: Admit: 2020-06-17 | Discharge: 2020-06-18 | Payer: MEDICARE

## 2020-06-17 MED ORDER — TAMSULOSIN 0.4 MG CAPSULE
ORAL_CAPSULE | Freq: Every day | ORAL | 3 refills | 90 days | Status: CP
Start: 2020-06-17 — End: ?

## 2020-07-19 ENCOUNTER — Encounter: Admit: 2020-07-19 | Discharge: 2020-07-20 | Payer: MEDICARE

## 2020-07-19 MED ORDER — CIPROFLOXACIN 500 MG TABLET
ORAL_TABLET | Freq: Two times a day (BID) | ORAL | 1 refills | 30.00000 days | Status: CP
Start: 2020-07-19 — End: ?

## 2020-08-01 ENCOUNTER — Ambulatory Visit: Admit: 2020-08-01 | Discharge: 2020-08-02 | Payer: MEDICARE | Attending: Urology | Primary: Urology

## 2020-08-01 ENCOUNTER — Encounter: Admit: 2020-08-01 | Discharge: 2020-08-02 | Payer: MEDICARE

## 2020-08-07 DIAGNOSIS — B999 Unspecified infectious disease: Principal | ICD-10-CM

## 2020-09-21 ENCOUNTER — Encounter: Admit: 2020-09-21 | Discharge: 2020-09-22 | Payer: MEDICARE

## 2020-09-21 DIAGNOSIS — I2510 Atherosclerotic heart disease of native coronary artery without angina pectoris: Principal | ICD-10-CM

## 2020-09-21 DIAGNOSIS — M86479 Chronic osteomyelitis with draining sinus, unspecified ankle and foot: Principal | ICD-10-CM

## 2020-09-21 DIAGNOSIS — I10 Essential (primary) hypertension: Principal | ICD-10-CM

## 2020-09-21 DIAGNOSIS — B999 Unspecified infectious disease: Principal | ICD-10-CM

## 2020-09-21 DIAGNOSIS — Z23 Encounter for immunization: Principal | ICD-10-CM

## 2020-09-21 MED ORDER — DOXYCYCLINE HYCLATE 100 MG TABLET
ORAL_TABLET | Freq: Two times a day (BID) | ORAL | 0 refills | 60.00000 days | Status: CP
Start: 2020-09-21 — End: 2020-11-20

## 2020-10-04 ENCOUNTER — Ambulatory Visit: Admit: 2020-10-04 | Discharge: 2020-10-05 | Payer: MEDICARE

## 2020-10-04 DIAGNOSIS — M86471 Chronic osteomyelitis with draining sinus, right ankle and foot: Principal | ICD-10-CM

## 2020-10-04 MED ORDER — DOXYCYCLINE HYCLATE 100 MG TABLET
ORAL_TABLET | Freq: Two times a day (BID) | ORAL | 1 refills | 30 days | Status: CP
Start: 2020-10-04 — End: 2020-12-27

## 2020-10-24 DIAGNOSIS — R350 Frequency of micturition: Principal | ICD-10-CM

## 2020-10-24 DIAGNOSIS — N401 Enlarged prostate with lower urinary tract symptoms: Principal | ICD-10-CM

## 2020-10-24 MED ORDER — TAMSULOSIN 0.4 MG CAPSULE
ORAL_CAPSULE | Freq: Every day | ORAL | 2 refills | 90 days | Status: CP
Start: 2020-10-24 — End: 2020-10-27

## 2020-10-27 DIAGNOSIS — R350 Frequency of micturition: Principal | ICD-10-CM

## 2020-10-27 DIAGNOSIS — N401 Enlarged prostate with lower urinary tract symptoms: Principal | ICD-10-CM

## 2020-10-27 MED ORDER — TAMSULOSIN 0.4 MG CAPSULE
ORAL_CAPSULE | Freq: Every day | ORAL | 2 refills | 90.00000 days | Status: CP
Start: 2020-10-27 — End: 2020-12-27

## 2020-11-09 ENCOUNTER — Non-Acute Institutional Stay: Admit: 2020-11-09 | Discharge: 2020-11-09 | Payer: MEDICARE

## 2020-11-09 ENCOUNTER — Ambulatory Visit: Admit: 2020-11-09 | Discharge: 2020-11-09 | Payer: MEDICARE

## 2020-11-09 ENCOUNTER — Encounter: Admit: 2020-11-09 | Discharge: 2020-11-09 | Payer: MEDICARE

## 2020-11-09 DIAGNOSIS — Z23 Encounter for immunization: Principal | ICD-10-CM

## 2020-11-09 DIAGNOSIS — M86479 Chronic osteomyelitis with draining sinus, unspecified ankle and foot: Principal | ICD-10-CM

## 2020-11-16 ENCOUNTER — Encounter: Admit: 2020-11-16 | Discharge: 2020-11-17 | Payer: MEDICARE

## 2020-11-16 DIAGNOSIS — T847XXA Infection and inflammatory reaction due to other internal orthopedic prosthetic devices, implants and grafts, initial encounter: Principal | ICD-10-CM

## 2020-11-22 DIAGNOSIS — N401 Enlarged prostate with lower urinary tract symptoms: Principal | ICD-10-CM

## 2020-11-22 DIAGNOSIS — J438 Other emphysema: Principal | ICD-10-CM

## 2020-11-22 DIAGNOSIS — F32A Depression, unspecified depression type: Principal | ICD-10-CM

## 2020-11-22 DIAGNOSIS — R350 Frequency of micturition: Principal | ICD-10-CM

## 2020-11-22 MED ORDER — IPRATROPIUM BROMIDE 17 MCG/ACTUATION HFA AEROSOL INHALER
Freq: Four times a day (QID) | RESPIRATORY_TRACT | 2 refills | 25 days
Start: 2020-11-22 — End: 2021-11-22

## 2020-11-22 MED ORDER — TAMSULOSIN 0.4 MG CAPSULE
ORAL_CAPSULE | Freq: Every day | ORAL | 2 refills | 90.00000 days
Start: 2020-11-22 — End: ?

## 2020-11-23 MED ORDER — SERTRALINE 100 MG TABLET
ORAL_TABLET | Freq: Every day | ORAL | 11 refills | 30 days
Start: 2020-11-23 — End: 2020-12-23

## 2020-11-23 MED ORDER — TAMSULOSIN 0.4 MG CAPSULE
ORAL_CAPSULE | Freq: Every day | ORAL | 2 refills | 90 days
Start: 2020-11-23 — End: ?

## 2020-11-23 MED ORDER — IPRATROPIUM BROMIDE 17 MCG/ACTUATION HFA AEROSOL INHALER
Freq: Four times a day (QID) | RESPIRATORY_TRACT | 2 refills | 25.00000 days | Status: CP
Start: 2020-11-23 — End: 2021-11-23

## 2020-11-24 ENCOUNTER — Encounter: Admit: 2020-11-24 | Discharge: 2020-11-25 | Payer: MEDICARE | Attending: Family Medicine | Primary: Family Medicine

## 2020-11-24 DIAGNOSIS — F32A Depression, unspecified depression type: Principal | ICD-10-CM

## 2020-11-24 MED ORDER — SERTRALINE 100 MG TABLET
ORAL_TABLET | Freq: Every day | ORAL | 11 refills | 30 days | Status: CP
Start: 2020-11-24 — End: 2020-12-24

## 2020-12-12 DIAGNOSIS — R21 Rash and other nonspecific skin eruption: Principal | ICD-10-CM

## 2020-12-14 IMAGING — CT CT HEAD WITHOUT CONTRAST
4 of 7 series · 15 of 47 positions shown, 16 images · non-contrast
Comparison: None.

CLINICAL DATA: Patient found by sharp as patient stated he had
fallen with abrasion left periorbital region. ETOH on board.

EXAM:
CT HEAD WITHOUT CONTRAST
CT CERVICAL SPINE WITHOUT CONTRAST
TECHNIQUE: Multidetector CT imaging of the head and cervical spine was
performed following the standard protocol without intravenous
contrast. Multiplanar CT image reconstructions of the cervical spine
were also generated.

[Series 2: head wo · axial · 0.44mm/px · z∈[-79,-29]mm · 2 of 30 slices shown, 3 images]
[im 10/30  brain]
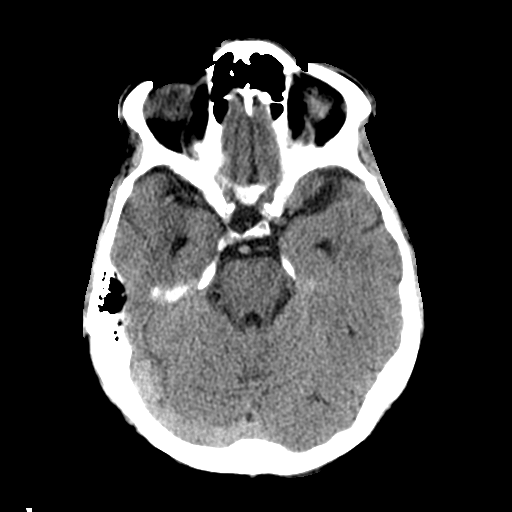
[im 10/30  bone]
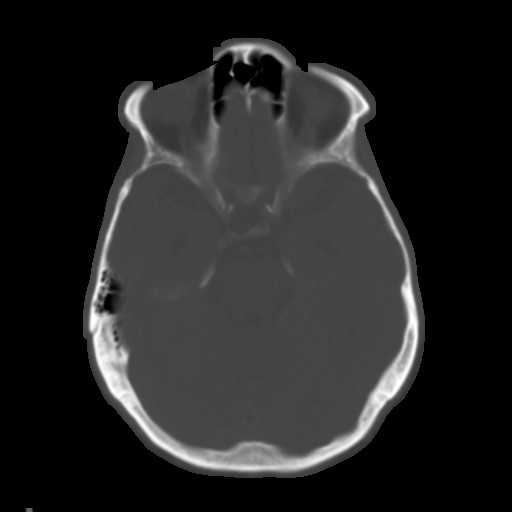
[im 20/30  brain]
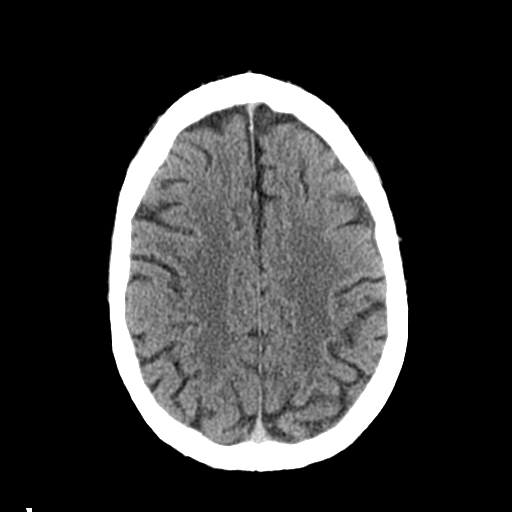

[Series 4: coronal soft tissue · coronal · 0.31mm/px · 3 of 71 slices shown]
[im 27/71  brain]
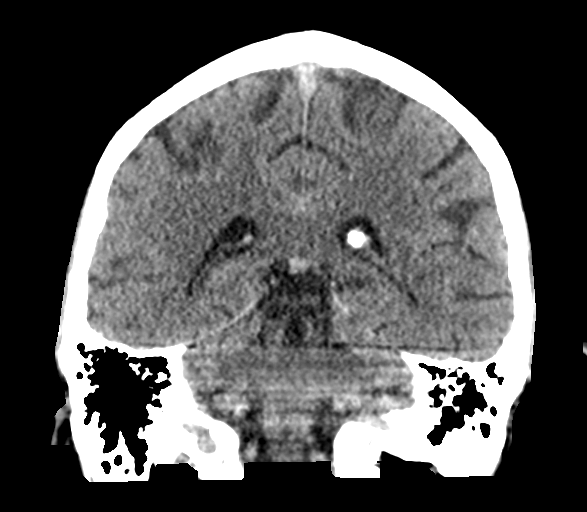
[im 36/71  brain]
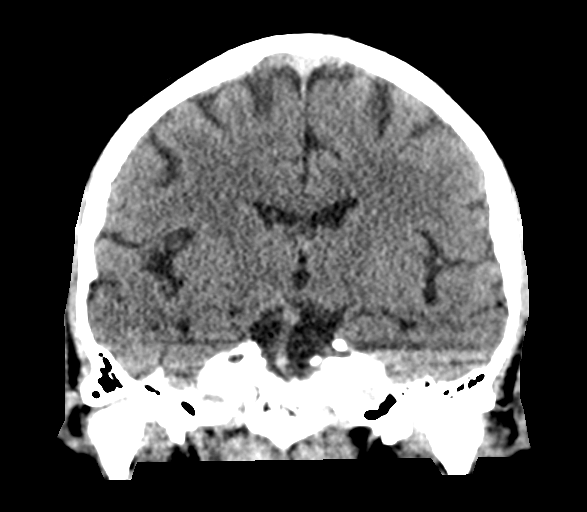
[im 44/71  brain]
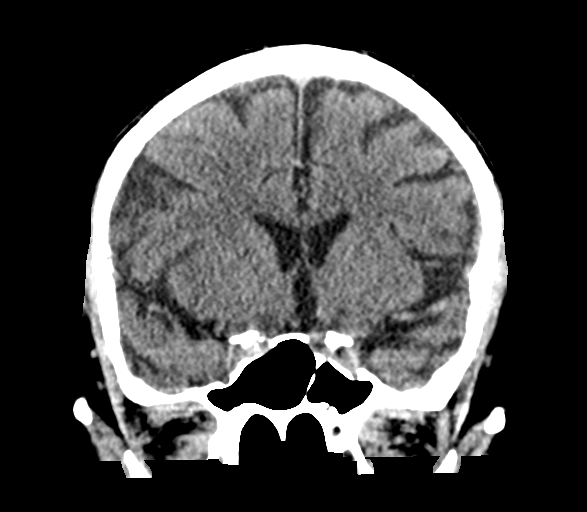

[Series 5: sagittal soft tissue · sagittal · 0.31mm/px · 2 of 53 slices shown]
[im 18/53  brain]
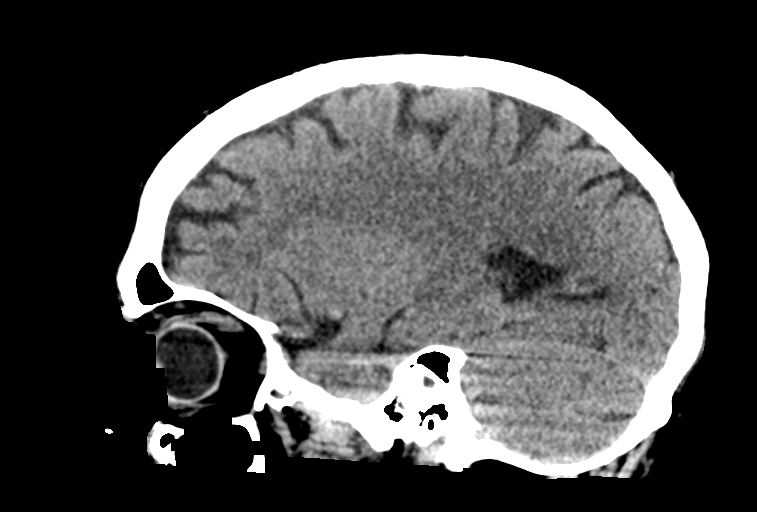
[im 35/53  brain]
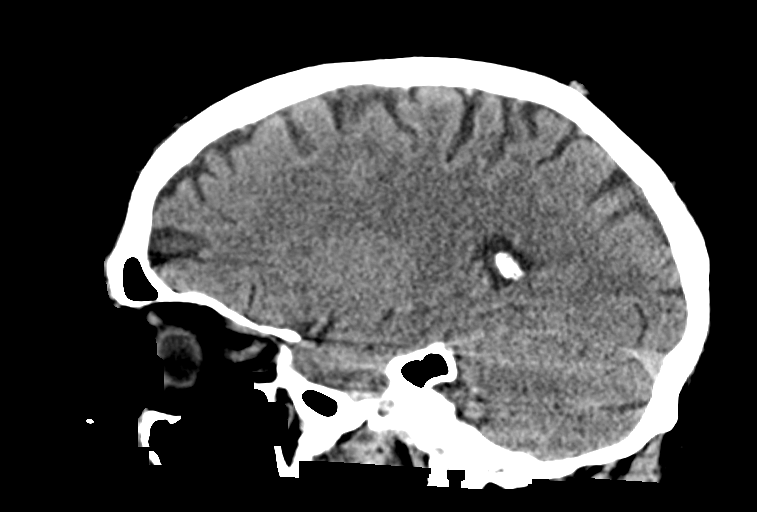

[Series 12: orthogonal bone · axial · 0.23mm/px · z∈[-330,-156]mm · 8 of 117 slices shown]
[im 9/117  bone]
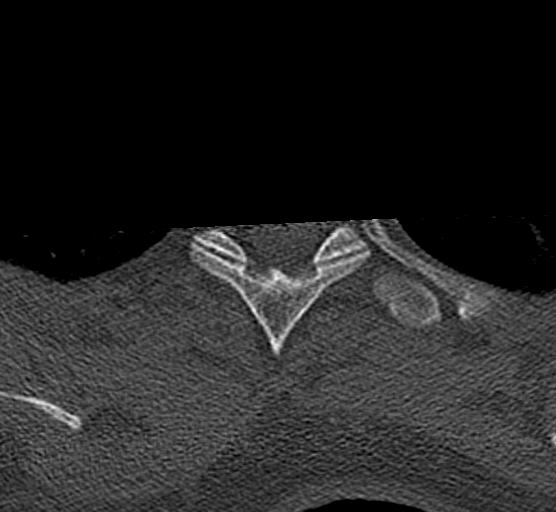
[im 27/117  bone]
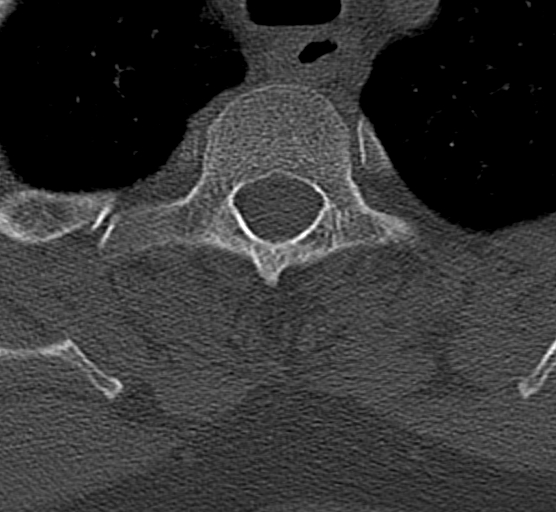
[im 36/117  bone]
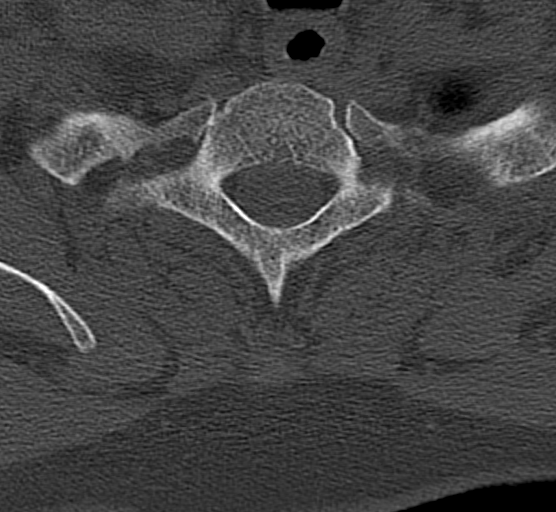
[im 54/117  bone]
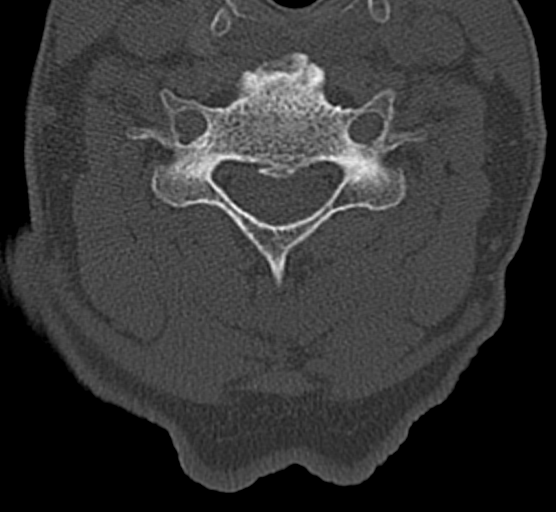
[im 63/117  bone]
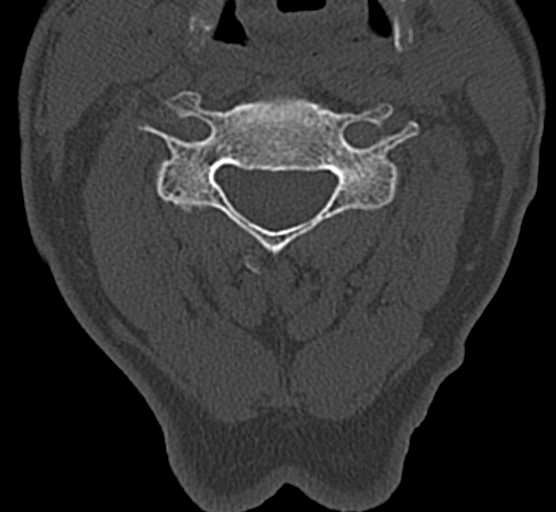
[im 81/117  bone]
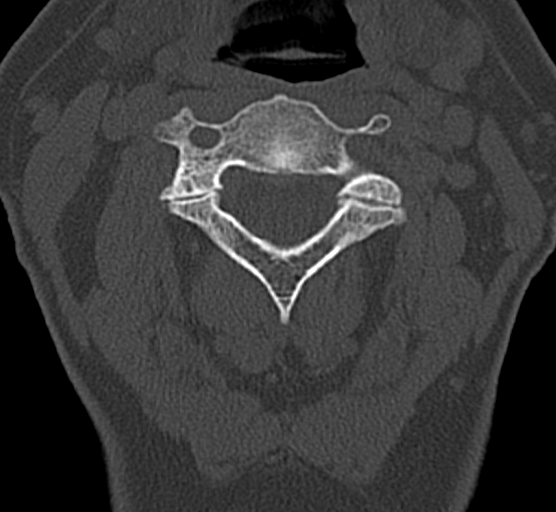
[im 90/117  bone]
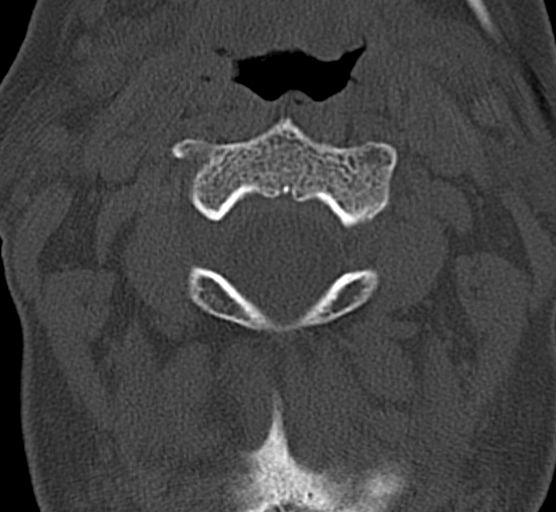
[im 108/117  bone]
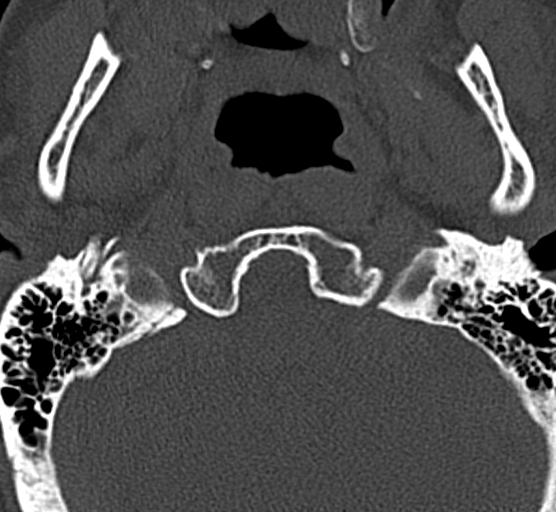

[15 of 47 positions shown; findings below may reference images not displayed]

FINDINGS: CT HEAD FINDINGS

Brain: Ventricles, cisterns and other CSF spaces are within normal.
No mass, mass effect, shift of midline structures or acute
hemorrhage. No evidence of acute infarction.

Vascular: No hyperdense vessel or unexpected calcification.

Skull: Normal. Negative for fracture or focal lesion.

Sinuses/Orbits: No acute finding.

Other: None.

CT CERVICAL SPINE FINDINGS

Alignment: Within normal.

Skull base and vertebrae: Mild-to-moderate spondylosis of the
cervical spine. Vertebral body heights are maintained. Atlantoaxial
articulation is within normal. There is uncovertebral joint spurring
and facet arthropathy. There is no acute fracture or subluxation.
There is moderate bilateral neural foraminal narrowing at multiple
levels due to adjacent bony spurring.

Soft tissues and spinal canal: No prevertebral fluid or swelling. No
visible canal hematoma.

Disc levels:  Disc space narrowing at the C5-6 and C6-7 levels.

Upper chest: Negative.

Other: None.
IMPRESSION: No acute brain injury.

No acute cervical spine injury.

Mild to moderate spondylosis of the cervical spine with disc disease
at the C5-6 and C6-7 levels. Multilevel neural foraminal narrowing
due to adjacent bony spurring.

## 2020-12-28 ENCOUNTER — Encounter: Admit: 2020-12-28 | Discharge: 2020-12-29 | Payer: MEDICARE | Attending: Internal Medicine | Primary: Internal Medicine

## 2020-12-28 DIAGNOSIS — R21 Rash and other nonspecific skin eruption: Principal | ICD-10-CM

## 2020-12-28 MED ORDER — CLOBETASOL 0.05 % TOPICAL OINTMENT
OPHTHALMIC | 2 refills | 0.00000 days | Status: CP
Start: 2020-12-28 — End: ?

## 2020-12-29 DIAGNOSIS — I10 Essential (primary) hypertension: Principal | ICD-10-CM

## 2020-12-29 MED ORDER — AMLODIPINE 10 MG TABLET
ORAL_TABLET | 3 refills | 0 days
Start: 2020-12-29 — End: ?

## 2021-01-01 MED ORDER — AMLODIPINE 10 MG TABLET
ORAL_TABLET | 3 refills | 0 days | Status: CP
Start: 2021-01-01 — End: ?

## 2021-01-24 DIAGNOSIS — I10 Essential (primary) hypertension: Principal | ICD-10-CM

## 2021-01-24 DIAGNOSIS — J438 Other emphysema: Principal | ICD-10-CM

## 2021-01-24 MED ORDER — AMLODIPINE 10 MG TABLET
ORAL_TABLET | Freq: Every day | ORAL | 3 refills | 90 days
Start: 2021-01-24 — End: ?

## 2021-01-24 MED ORDER — ATROVENT HFA 17 MCG/ACTUATION AEROSOL INHALER
Freq: Two times a day (BID) | RESPIRATORY_TRACT | 2 refills | 0 days | Status: CP
Start: 2021-01-24 — End: 2021-02-23

## 2021-04-12 ENCOUNTER — Encounter: Admit: 2021-04-12 | Discharge: 2021-04-13 | Payer: MEDICARE

## 2021-04-12 MED ORDER — ATORVASTATIN 10 MG TABLET
ORAL_TABLET | Freq: Every day | ORAL | 3 refills | 90 days | Status: CP
Start: 2021-04-12 — End: 2022-04-12

## 2021-04-12 MED ORDER — SERTRALINE 100 MG TABLET
ORAL_TABLET | Freq: Every day | ORAL | 5 refills | 30 days | Status: CP
Start: 2021-04-12 — End: 2021-10-09

## 2021-04-12 MED ORDER — TAMSULOSIN 0.4 MG CAPSULE
0 days
Start: 2021-04-12 — End: ?

## 2021-04-12 MED ORDER — LISINOPRIL 10 MG TABLET
ORAL_TABLET | Freq: Every day | ORAL | 3 refills | 90 days | Status: CP
Start: 2021-04-12 — End: 2022-04-12

## 2021-04-12 MED ORDER — BUPROPION HCL XL 150 MG 24 HR TABLET, EXTENDED RELEASE
ORAL_TABLET | Freq: Every morning | ORAL | 2 refills | 30.00000 days | Status: CP
Start: 2021-04-12 — End: 2022-04-12

## 2021-04-12 MED ORDER — ALBUTEROL SULFATE HFA 90 MCG/ACTUATION AEROSOL INHALER
Freq: Four times a day (QID) | RESPIRATORY_TRACT | 5 refills | 0 days | Status: CP | PRN
Start: 2021-04-12 — End: 2021-10-09

## 2021-05-09 ENCOUNTER — Institutional Professional Consult (permissible substitution): Admit: 2021-05-09 | Discharge: 2021-05-10 | Payer: MEDICARE

## 2021-05-10 ENCOUNTER — Ambulatory Visit: Admit: 2021-05-10 | Payer: MEDICARE

## 2021-05-27 MED ORDER — CIPROFLOXACIN 500 MG TABLET
ORAL_TABLET | 0 refills | 0 days
Start: 2021-05-27 — End: ?

## 2021-05-29 MED ORDER — CIPROFLOXACIN 500 MG TABLET
ORAL_TABLET | 0 refills | 0 days
Start: 2021-05-29 — End: ?

## 2021-05-31 ENCOUNTER — Ambulatory Visit: Admit: 2021-05-31 | Discharge: 2021-05-31 | Payer: MEDICARE

## 2021-05-31 ENCOUNTER — Encounter: Admit: 2021-05-31 | Discharge: 2021-05-31 | Payer: MEDICARE

## 2021-06-16 DIAGNOSIS — M86471 Chronic osteomyelitis with draining sinus, right ankle and foot: Principal | ICD-10-CM

## 2021-07-26 ENCOUNTER — Ambulatory Visit: Admit: 2021-07-26 | Discharge: 2021-07-27 | Payer: MEDICARE

## 2021-07-26 DIAGNOSIS — M86479 Chronic osteomyelitis with draining sinus, unspecified ankle and foot: Principal | ICD-10-CM

## 2021-07-26 DIAGNOSIS — Z8619 Personal history of other infectious and parasitic diseases: Principal | ICD-10-CM

## 2021-07-26 DIAGNOSIS — T847XXA Infection and inflammatory reaction due to other internal orthopedic prosthetic devices, implants and grafts, initial encounter: Principal | ICD-10-CM

## 2021-07-26 DIAGNOSIS — Z23 Encounter for immunization: Principal | ICD-10-CM

## 2021-07-26 MED ORDER — DOXYCYCLINE HYCLATE 100 MG TABLET
ORAL_TABLET | Freq: Two times a day (BID) | ORAL | 1 refills | 90 days | Status: CP
Start: 2021-07-26 — End: ?

## 2021-07-29 MED ORDER — TAMSULOSIN 0.4 MG CAPSULE
ORAL_CAPSULE | 0 refills | 0 days
Start: 2021-07-29 — End: ?

## 2021-07-31 MED ORDER — TAMSULOSIN 0.4 MG CAPSULE
ORAL_CAPSULE | 0 refills | 0 days | Status: CP
Start: 2021-07-31 — End: ?

## 2021-11-11 MED ORDER — TAMSULOSIN 0.4 MG CAPSULE
ORAL_CAPSULE | 0 refills | 0 days
Start: 2021-11-11 — End: ?

## 2021-11-13 MED ORDER — TAMSULOSIN 0.4 MG CAPSULE
ORAL_CAPSULE | 0 refills | 0 days | Status: CP
Start: 2021-11-13 — End: ?

## 2021-11-28 ENCOUNTER — Ambulatory Visit: Admit: 2021-11-28 | Discharge: 2021-11-29 | Payer: MEDICARE | Attending: Family Medicine | Primary: Family Medicine

## 2021-11-28 MED ORDER — ZOLOFT 100 MG TABLET
ORAL_TABLET | Freq: Every day | ORAL | 3 refills | 90 days | Status: CP
Start: 2021-11-28 — End: 2022-11-28

## 2021-12-01 DIAGNOSIS — R7989 Other specified abnormal findings of blood chemistry: Principal | ICD-10-CM

## 2021-12-06 DIAGNOSIS — J1282 Pneumonia due to coronavirus disease 2019: Secondary | ICD-10-CM

## 2021-12-06 DIAGNOSIS — U071 COVID-19: Secondary | ICD-10-CM

## 2021-12-06 HISTORY — DX: Pneumonia due to coronavirus disease 2019: J12.82

## 2021-12-06 HISTORY — DX: COVID-19: U07.1

## 2021-12-08 ENCOUNTER — Ambulatory Visit: Admit: 2021-12-08 | Discharge: 2021-12-09 | Payer: MEDICARE

## 2021-12-08 DIAGNOSIS — F331 Major depressive disorder, recurrent, moderate: Principal | ICD-10-CM

## 2021-12-08 MED ORDER — SERTRALINE 100 MG TABLET
ORAL_TABLET | Freq: Every day | ORAL | 3 refills | 90 days | Status: CP
Start: 2021-12-08 — End: 2022-12-08

## 2021-12-08 MED ORDER — SERTRALINE 50 MG TABLET
ORAL_TABLET | Freq: Every day | ORAL | 11 refills | 30 days | Status: CP
Start: 2021-12-08 — End: 2021-12-08

## 2021-12-11 DIAGNOSIS — N179 Acute kidney failure, unspecified: Principal | ICD-10-CM

## 2021-12-12 ENCOUNTER — Encounter
Admit: 2021-12-12 | Discharge: 2021-12-22 | Disposition: A | Discharge status: Home / Self Care | Payer: MEDICARE | Admitting: Nephrology

## 2021-12-12 ENCOUNTER — Ambulatory Visit: Admit: 2021-12-12 | Payer: MEDICARE

## 2021-12-12 ENCOUNTER — Encounter: Admit: 2021-12-12 | Payer: MEDICARE

## 2021-12-12 ENCOUNTER — Encounter: Admit: 2021-12-12 | Payer: MEDICARE | Attending: Student in an Organized Health Care Education/Training Program

## 2021-12-12 ENCOUNTER — Ambulatory Visit
Admit: 2021-12-12 | Discharge: 2021-12-22 | Disposition: A | Discharge status: Home / Self Care | Payer: MEDICARE | Admitting: Nephrology

## 2021-12-12 ENCOUNTER — Encounter
Admit: 2021-12-12 | Discharge: 2021-12-22 | Disposition: A | Discharge status: Home / Self Care | Payer: MEDICARE | Attending: Nephrology | Admitting: Nephrology

## 2021-12-12 DIAGNOSIS — A419 Sepsis, unspecified organism: Secondary | ICD-10-CM

## 2021-12-12 DIAGNOSIS — Z8616 Personal history of COVID-19: Secondary | ICD-10-CM

## 2021-12-12 HISTORY — DX: Sepsis, unspecified organism: A41.9

## 2021-12-12 HISTORY — DX: Personal history of COVID-19: Z86.16

## 2021-12-13 DIAGNOSIS — I5020 Unspecified systolic (congestive) heart failure: Secondary | ICD-10-CM

## 2021-12-13 DIAGNOSIS — I219 Acute myocardial infarction, unspecified: Secondary | ICD-10-CM

## 2021-12-13 DIAGNOSIS — I2119 ST elevation (STEMI) myocardial infarction involving other coronary artery of inferior wall: Secondary | ICD-10-CM

## 2021-12-13 DIAGNOSIS — N200 Calculus of kidney: Secondary | ICD-10-CM

## 2021-12-13 DIAGNOSIS — N179 Acute kidney failure, unspecified: Secondary | ICD-10-CM

## 2021-12-13 HISTORY — PX: PERCUTANEOUS NEPHROSTOMY: SHX2208

## 2021-12-13 HISTORY — DX: Unspecified systolic (congestive) heart failure: I50.20

## 2021-12-13 HISTORY — DX: ST elevation (STEMI) myocardial infarction involving other coronary artery of inferior wall: I21.19

## 2021-12-13 HISTORY — PX: LEFT HEART CATH: CATH118248

## 2021-12-13 HISTORY — PX: OTHER SURGICAL HISTORY: SHX169

## 2021-12-13 HISTORY — PX: LEFT HEART CATH AND CORONARY ANGIOGRAPHY: CATH118249

## 2021-12-13 HISTORY — DX: Acute myocardial infarction, unspecified: I21.9

## 2021-12-13 HISTORY — DX: Calculus of kidney: N20.0

## 2021-12-13 HISTORY — DX: Acute kidney failure, unspecified: N17.9

## 2021-12-14 DIAGNOSIS — J9690 Respiratory failure, unspecified, unspecified whether with hypoxia or hypercapnia: Secondary | ICD-10-CM

## 2021-12-14 DIAGNOSIS — J9601 Acute respiratory failure with hypoxia: Secondary | ICD-10-CM

## 2021-12-14 HISTORY — DX: Respiratory failure, unspecified, unspecified whether with hypoxia or hypercapnia: J96.90

## 2021-12-14 HISTORY — DX: Acute respiratory failure with hypoxia: J96.01

## 2021-12-14 HISTORY — PX: CYSTOURETHROSCOPY: SHX476

## 2021-12-14 HISTORY — PX: NEPHROSTOMY TUBE PLACEMENT (ARMC HX): HXRAD1726

## 2021-12-19 DIAGNOSIS — K922 Gastrointestinal hemorrhage, unspecified: Secondary | ICD-10-CM

## 2021-12-19 HISTORY — DX: Gastrointestinal hemorrhage, unspecified: K92.2

## 2021-12-21 DIAGNOSIS — I4891 Unspecified atrial fibrillation: Secondary | ICD-10-CM

## 2021-12-21 DIAGNOSIS — K259 Gastric ulcer, unspecified as acute or chronic, without hemorrhage or perforation: Secondary | ICD-10-CM

## 2021-12-21 HISTORY — DX: Gastric ulcer, unspecified as acute or chronic, without hemorrhage or perforation: K25.9

## 2021-12-21 HISTORY — PX: UPPER GASTROINTESTINAL ENDOSCOPY: SHX188

## 2021-12-21 HISTORY — DX: Unspecified atrial fibrillation: I48.91

## 2021-12-22 DIAGNOSIS — N200 Calculus of kidney: Principal | ICD-10-CM

## 2021-12-22 MED ORDER — ATORVASTATIN 80 MG TABLET
ORAL_TABLET | Freq: Every day | ORAL | 0 refills | 30 days | Status: CP
Start: 2021-12-22 — End: 2022-01-21

## 2021-12-22 MED ORDER — TAMSULOSIN 0.4 MG CAPSULE
ORAL_CAPSULE | Freq: Every evening | ORAL | 3 refills | 90 days | Status: CP
Start: 2021-12-22 — End: 2022-12-22

## 2021-12-22 MED ORDER — PANTOPRAZOLE 40 MG TABLET,DELAYED RELEASE
ORAL_TABLET | Freq: Two times a day (BID) | ORAL | 0 refills | 30 days | Status: CP
Start: 2021-12-22 — End: 2022-01-21

## 2021-12-23 MED ORDER — CEFDINIR 300 MG CAPSULE
ORAL_CAPSULE | ORAL | 0 refills | 1 days | Status: CP
Start: 2021-12-23 — End: 2021-12-24

## 2021-12-23 MED ORDER — ASPIRIN 81 MG CHEWABLE TABLET
ORAL_TABLET | Freq: Every day | ORAL | 0 refills | 30 days | Status: CP
Start: 2021-12-23 — End: 2022-01-22

## 2021-12-26 ENCOUNTER — Emergency Department: Payer: Medicare Other

## 2021-12-26 ENCOUNTER — Other Ambulatory Visit: Payer: Self-pay

## 2021-12-26 ENCOUNTER — Encounter: Payer: Self-pay | Admitting: Emergency Medicine

## 2021-12-26 ENCOUNTER — Inpatient Hospital Stay
Admission: EM | Admit: 2021-12-26 | Discharge: 2022-01-14 | DRG: 853 | Disposition: A | Discharge status: Home / Self Care | Payer: Medicare Other | Attending: Internal Medicine | Admitting: Internal Medicine

## 2021-12-26 DIAGNOSIS — U071 COVID-19: Secondary | ICD-10-CM | POA: Diagnosis present

## 2021-12-26 DIAGNOSIS — A4151 Sepsis due to Escherichia coli [E. coli]: Principal | ICD-10-CM | POA: Diagnosis present

## 2021-12-26 DIAGNOSIS — T83122A Displacement of urinary stent, initial encounter: Secondary | ICD-10-CM | POA: Diagnosis not present

## 2021-12-26 DIAGNOSIS — E1165 Type 2 diabetes mellitus with hyperglycemia: Secondary | ICD-10-CM | POA: Diagnosis not present

## 2021-12-26 DIAGNOSIS — A419 Sepsis, unspecified organism: Secondary | ICD-10-CM

## 2021-12-26 DIAGNOSIS — E785 Hyperlipidemia, unspecified: Secondary | ICD-10-CM | POA: Diagnosis present

## 2021-12-26 DIAGNOSIS — D696 Thrombocytopenia, unspecified: Secondary | ICD-10-CM | POA: Diagnosis not present

## 2021-12-26 DIAGNOSIS — N179 Acute kidney failure, unspecified: Secondary | ICD-10-CM | POA: Diagnosis not present

## 2021-12-26 DIAGNOSIS — F32A Depression, unspecified: Secondary | ICD-10-CM | POA: Diagnosis present

## 2021-12-26 DIAGNOSIS — N3000 Acute cystitis without hematuria: Secondary | ICD-10-CM

## 2021-12-26 DIAGNOSIS — N390 Urinary tract infection, site not specified: Secondary | ICD-10-CM | POA: Diagnosis present

## 2021-12-26 DIAGNOSIS — E876 Hypokalemia: Secondary | ICD-10-CM | POA: Diagnosis not present

## 2021-12-26 DIAGNOSIS — M8660 Other chronic osteomyelitis, unspecified site: Secondary | ICD-10-CM

## 2021-12-26 DIAGNOSIS — I2510 Atherosclerotic heart disease of native coronary artery without angina pectoris: Secondary | ICD-10-CM | POA: Diagnosis present

## 2021-12-26 DIAGNOSIS — R339 Retention of urine, unspecified: Secondary | ICD-10-CM | POA: Diagnosis not present

## 2021-12-26 DIAGNOSIS — R7309 Other abnormal glucose: Secondary | ICD-10-CM | POA: Diagnosis present

## 2021-12-26 DIAGNOSIS — R7881 Bacteremia: Secondary | ICD-10-CM

## 2021-12-26 DIAGNOSIS — I130 Hypertensive heart and chronic kidney disease with heart failure and stage 1 through stage 4 chronic kidney disease, or unspecified chronic kidney disease: Secondary | ICD-10-CM | POA: Diagnosis present

## 2021-12-26 DIAGNOSIS — I255 Ischemic cardiomyopathy: Secondary | ICD-10-CM | POA: Diagnosis present

## 2021-12-26 DIAGNOSIS — Z87891 Personal history of nicotine dependence: Secondary | ICD-10-CM

## 2021-12-26 DIAGNOSIS — R609 Edema, unspecified: Secondary | ICD-10-CM

## 2021-12-26 DIAGNOSIS — R600 Localized edema: Secondary | ICD-10-CM

## 2021-12-26 DIAGNOSIS — Z046 Encounter for general psychiatric examination, requested by authority: Secondary | ICD-10-CM

## 2021-12-26 DIAGNOSIS — M86671 Other chronic osteomyelitis, right ankle and foot: Secondary | ICD-10-CM | POA: Diagnosis present

## 2021-12-26 DIAGNOSIS — I959 Hypotension, unspecified: Secondary | ICD-10-CM

## 2021-12-26 DIAGNOSIS — D638 Anemia in other chronic diseases classified elsewhere: Secondary | ICD-10-CM

## 2021-12-26 DIAGNOSIS — E1122 Type 2 diabetes mellitus with diabetic chronic kidney disease: Secondary | ICD-10-CM | POA: Diagnosis present

## 2021-12-26 DIAGNOSIS — Z955 Presence of coronary angioplasty implant and graft: Secondary | ICD-10-CM

## 2021-12-26 DIAGNOSIS — I5042 Chronic combined systolic (congestive) and diastolic (congestive) heart failure: Secondary | ICD-10-CM | POA: Diagnosis present

## 2021-12-26 DIAGNOSIS — G9341 Metabolic encephalopathy: Secondary | ICD-10-CM | POA: Diagnosis present

## 2021-12-26 DIAGNOSIS — E871 Hypo-osmolality and hyponatremia: Secondary | ICD-10-CM | POA: Diagnosis present

## 2021-12-26 DIAGNOSIS — J441 Chronic obstructive pulmonary disease with (acute) exacerbation: Secondary | ICD-10-CM | POA: Diagnosis present

## 2021-12-26 DIAGNOSIS — I4891 Unspecified atrial fibrillation: Secondary | ICD-10-CM | POA: Diagnosis not present

## 2021-12-26 DIAGNOSIS — Z1612 Extended spectrum beta lactamase (ESBL) resistance: Secondary | ICD-10-CM | POA: Diagnosis present

## 2021-12-26 DIAGNOSIS — R778 Other specified abnormalities of plasma proteins: Secondary | ICD-10-CM | POA: Diagnosis present

## 2021-12-26 DIAGNOSIS — Y732 Prosthetic and other implants, materials and accessory gastroenterology and urology devices associated with adverse incidents: Secondary | ICD-10-CM | POA: Diagnosis not present

## 2021-12-26 DIAGNOSIS — R6521 Severe sepsis with septic shock: Secondary | ICD-10-CM | POA: Diagnosis present

## 2021-12-26 DIAGNOSIS — R627 Adult failure to thrive: Secondary | ICD-10-CM | POA: Diagnosis present

## 2021-12-26 DIAGNOSIS — K219 Gastro-esophageal reflux disease without esophagitis: Secondary | ICD-10-CM | POA: Diagnosis present

## 2021-12-26 DIAGNOSIS — Z8619 Personal history of other infectious and parasitic diseases: Secondary | ICD-10-CM

## 2021-12-26 DIAGNOSIS — I213 ST elevation (STEMI) myocardial infarction of unspecified site: Secondary | ICD-10-CM | POA: Diagnosis present

## 2021-12-26 DIAGNOSIS — R2241 Localized swelling, mass and lump, right lower limb: Secondary | ICD-10-CM

## 2021-12-26 DIAGNOSIS — Z8711 Personal history of peptic ulcer disease: Secondary | ICD-10-CM

## 2021-12-26 DIAGNOSIS — Z79899 Other long term (current) drug therapy: Secondary | ICD-10-CM

## 2021-12-26 DIAGNOSIS — B9620 Unspecified Escherichia coli [E. coli] as the cause of diseases classified elsewhere: Secondary | ICD-10-CM

## 2021-12-26 DIAGNOSIS — R6520 Severe sepsis without septic shock: Secondary | ICD-10-CM | POA: Diagnosis not present

## 2021-12-26 DIAGNOSIS — D631 Anemia in chronic kidney disease: Secondary | ICD-10-CM | POA: Diagnosis present

## 2021-12-26 DIAGNOSIS — B379 Candidiasis, unspecified: Secondary | ICD-10-CM

## 2021-12-26 DIAGNOSIS — N184 Chronic kidney disease, stage 4 (severe): Secondary | ICD-10-CM | POA: Diagnosis present

## 2021-12-26 DIAGNOSIS — B377 Candidal sepsis: Secondary | ICD-10-CM | POA: Diagnosis present

## 2021-12-26 DIAGNOSIS — R7989 Other specified abnormal findings of blood chemistry: Secondary | ICD-10-CM | POA: Diagnosis present

## 2021-12-26 DIAGNOSIS — E872 Acidosis, unspecified: Secondary | ICD-10-CM | POA: Diagnosis present

## 2021-12-26 DIAGNOSIS — I10 Essential (primary) hypertension: Secondary | ICD-10-CM | POA: Diagnosis present

## 2021-12-26 DIAGNOSIS — J449 Chronic obstructive pulmonary disease, unspecified: Secondary | ICD-10-CM | POA: Diagnosis present

## 2021-12-26 DIAGNOSIS — N189 Chronic kidney disease, unspecified: Secondary | ICD-10-CM

## 2021-12-26 DIAGNOSIS — N200 Calculus of kidney: Principal | ICD-10-CM

## 2021-12-26 HISTORY — DX: Sepsis, unspecified organism: A41.9

## 2021-12-26 HISTORY — DX: COVID-19: U07.1

## 2021-12-26 HISTORY — DX: Personal history of other infectious and parasitic diseases: Z86.19

## 2021-12-26 LAB — URINE DRUG SCREEN, QUALITATIVE (ARMC ONLY)
Amphetamines, Ur Screen: NOT DETECTED
Barbiturates, Ur Screen: NOT DETECTED
Benzodiazepine, Ur Scrn: NOT DETECTED
Cannabinoid 50 Ng, Ur ~~LOC~~: NOT DETECTED
Cocaine Metabolite,Ur ~~LOC~~: NOT DETECTED
MDMA (Ecstasy)Ur Screen: NOT DETECTED
Methadone Scn, Ur: NOT DETECTED
Opiate, Ur Screen: NOT DETECTED
Phencyclidine (PCP) Ur S: NOT DETECTED
Tricyclic, Ur Screen: NOT DETECTED

## 2021-12-26 LAB — ETHANOL: Alcohol, Ethyl (B): <10 mg/dL (ref <10)

## 2021-12-26 LAB — CBC
HCT: 32.2 % — ABNORMAL LOW (ref 39.0–52.0)
Hemoglobin: 10.2 g/dL — ABNORMAL LOW (ref 13.0–17.0)
MCH: 28.3 pg (ref 26.0–34.0)
MCHC: 31.7 g/dL (ref 30.0–36.0)
MCV: 89.2 fL (ref 80.0–100.0)
Platelets: 207 10*3/uL (ref 150–400)
RBC: 3.61 MIL/uL — ABNORMAL LOW (ref 4.22–5.81)
RDW: 18 % — ABNORMAL HIGH (ref 11.5–15.5)
WBC: 19.2 10*3/uL — ABNORMAL HIGH (ref 4.0–10.5)
nRBC: 0 % (ref 0.0–0.2)

## 2021-12-26 LAB — URINALYSIS, ROUTINE W REFLEX MICROSCOPIC
Bilirubin Urine: NEGATIVE
Glucose, UA: NEGATIVE mg/dL
Ketones, ur: NEGATIVE mg/dL
Nitrite: NEGATIVE
Protein, ur: 100 mg/dL — AB
Specific Gravity, Urine: 1.01 (ref 1.005–1.030)
Squamous Epithelial / HPF: NONE SEEN (ref 0–5)
WBC, UA: >50 WBC/hpf — ABNORMAL HIGH (ref 0–5)
pH: 6 (ref 5.0–8.0)

## 2021-12-26 LAB — COMPREHENSIVE METABOLIC PANEL
ALT: 55 U/L — ABNORMAL HIGH (ref 0–44)
AST: 31 U/L (ref 15–41)
Albumin: 2.7 g/dL — ABNORMAL LOW (ref 3.5–5.0)
Alkaline Phosphatase: 204 U/L — ABNORMAL HIGH (ref 38–126)
Anion gap: 13 (ref 5–15)
BUN: 65 mg/dL — ABNORMAL HIGH (ref 8–23)
CO2: 18 mmol/L — ABNORMAL LOW (ref 22–32)
Calcium: 8.7 mg/dL — ABNORMAL LOW (ref 8.9–10.3)
Chloride: 100 mmol/L (ref 98–111)
Creatinine, Ser: 3.63 mg/dL — ABNORMAL HIGH (ref 0.61–1.24)
GFR, Estimated: 18 mL/min — ABNORMAL LOW (ref >60)
Glucose, Bld: 156 mg/dL — ABNORMAL HIGH (ref 70–99)
Potassium: 4 mmol/L (ref 3.5–5.1)
Sodium: 131 mmol/L — ABNORMAL LOW (ref 135–145)
Total Bilirubin: 2.1 mg/dL — ABNORMAL HIGH (ref 0.3–1.2)
Total Protein: 8.1 g/dL (ref 6.5–8.1)

## 2021-12-26 LAB — TROPONIN I (HIGH SENSITIVITY)
Troponin I (High Sensitivity): 67 ng/L — ABNORMAL HIGH (ref <18)
Troponin I (High Sensitivity): 70 ng/L — ABNORMAL HIGH (ref <18)

## 2021-12-26 LAB — CK: Total CK: 10 U/L — ABNORMAL LOW (ref 49–397)

## 2021-12-26 LAB — LACTIC ACID, PLASMA: Lactic Acid, Venous: 1.7 mmol/L (ref 0.5–1.9)

## 2021-12-26 LAB — ACETAMINOPHEN LEVEL: Acetaminophen (Tylenol), Serum: <10 ug/mL — ABNORMAL LOW (ref 10–30)

## 2021-12-26 LAB — BRAIN NATRIURETIC PEPTIDE: B Natriuretic Peptide: 317.2 pg/mL — ABNORMAL HIGH (ref 0.0–100.0)

## 2021-12-26 LAB — SALICYLATE LEVEL: Salicylate Lvl: <7 mg/dL — ABNORMAL LOW (ref 7.0–30.0)

## 2021-12-26 MED ORDER — SODIUM CHLORIDE 0.9 % IV BOLUS
1000.0000 mL | Freq: Once | INTRAVENOUS | Status: AC
  Administered 2021-12-26: 1000 mL via INTRAVENOUS

## 2021-12-26 MED ORDER — SODIUM CHLORIDE 0.9 % IV SOLN
2.0000 g | Freq: Once | INTRAVENOUS | Status: AC
  Administered 2021-12-26: 2 g via INTRAVENOUS
  Filled 2021-12-26: qty 2

## 2021-12-26 MED ORDER — ONDANSETRON HCL 4 MG/2ML IJ SOLN
4.0000 mg | Freq: Once | INTRAMUSCULAR | Status: AC
  Administered 2021-12-26: 4 mg via INTRAVENOUS
  Filled 2021-12-26: qty 2

## 2021-12-26 MED ORDER — SODIUM CHLORIDE 0.9 % IV SOLN: INTRAVENOUS | Status: DC

## 2021-12-26 NOTE — Assessment & Plan Note (Deleted)
Currently stable.

## 2021-12-26 NOTE — Assessment & Plan Note (Deleted)
Urinary tract infection with acute kidney injury. Started on cefepime.  Will change to ceftriaxone. Follow cultures and sensitivity and tailor therapy further.

## 2021-12-26 NOTE — Assessment & Plan Note (Deleted)
Blood pressure currently soft.  Home amlodipine held.  Monitor blood pressure

## 2021-12-26 NOTE — Consult Note (Signed)
CODE SEPSIS - PHARMACY COMMUNICATION  **Broad Spectrum Antibiotics should be administered within 1 hour of Sepsis diagnosis**  Time Code Sepsis Called/Page Received: 2220  Antibiotics Ordered: 2230  Time of 1st antibiotic administration: 2248  Additional action taken by pharmacy: N/A  If necessary, Name of Provider/Nurse Contacted: N/A  Lorna Dibble ,PharmD Clinical Pharmacist  12/26/2021  10:28 PM

## 2021-12-26 NOTE — ED Notes (Signed)
Pt covered in dry fecal matter covering torso, upper and lower extremities. Pt has fecal matter under nails. Pt provided with full bath and linen change. Noted chronic wound to R medial ankle with small draining lesion. No other skin breakdown noted at time of bath.

## 2021-12-26 NOTE — Assessment & Plan Note (Deleted)
No history of diabetes mellitus type 2.  Monitor blood sugars.  Continue sliding scale if needed

## 2021-12-26 NOTE — Assessment & Plan Note (Deleted)
Presents with tachycardia, hypotension, leukocytosis.  Urinalysis suggestive  of UTI. Started on IV fluids.  Follow-up urine culture, blood culture

## 2021-12-26 NOTE — Consult Note (Signed)
PHARMACY -  BRIEF ANTIBIOTIC NOTE   Pharmacy has received consult(s) for cefepime from an ED provider.  The patient's profile has been reviewed for ht/wt/allergies/indication/available labs.    One time order(s) placed for: Cefepime 2g IV x1  Further antibiotics/pharmacy consults should be ordered by admitting physician if indicated.                       Thank you, Lorna Dibble 12/26/2021  10:28 PM

## 2021-12-26 NOTE — Assessment & Plan Note (Addendum)
Seen by psychiatry and no further recommendations.   Continued on Zoloft, patient reportedly declined.  Zoloft was continued at time of discharge.

## 2021-12-26 NOTE — H&P (Addendum)
History and Physical    Patient: Wayne Burns OTL:[id number] DOB: Jan 15, 1956 DOA: 12/26/2021 DOS: the patient was seen and examined on 12/27/2021 PCP: Jerene Pitch, MD  Patient coming from: Home  Chief Complaint:  Chief Complaint  Patient presents with   Failure To Thrive   HPI: Wayne Burns is a 66 y.o. male seen in the emergency room today for urinary tract infection, sepsis.  Patient is unable to give a clear HPI he is tachycardic and review of systems as well is difficult to obtain.  Initially brought by EMS today patient was IVC by family members for not being able to take care of himself and living in poor conditions refusing to go to the hospital and was deemed a danger to himself.  Per report per ED provider patient left AMA from [facility] on Friday for kidney stones COVID and blockage of his heart arteries.  Patient on my evaluation is alert oriented but tachycardic perseverates with answering questions and is not able to clearly give me history.  I would even say patient is mildly disoriented.  When asked about his heart history he initially says no then he says he had blockages.  When asked how he knows about the blockages he said he had been tested and I said did you ever have a cardiac cath he said yes.  I am unable to verify this information from [facility] epic system due to lack of access.  Patient appears disheveled unkempt head soiled himself with stool and urine on initial evaluation.  The emergency room patient meets sepsis criteria.   Review of Systems: unable to review all systems due to the inability of the patient to answer questions. Past Medical History:  Diagnosis Date   COPD (chronic obstructive pulmonary disease) ([place])    Hypertension    Past Surgical History:  Procedure Laterality Date   ANKLE SURGERY     Social History:  reports that he has quit smoking. His smoking use included cigarettes. He smoked an average of .5 packs per day. He has never used smokeless tobacco. He  reports that he does not currently use alcohol. He reports that he does not currently use drugs.  No Known Allergies  History reviewed. No pertinent family history.  Prior to Admission medications   Medication Sig Start Date End Date Taking? Authorizing Provider  amLODipine (NORVASC) 10 MG tablet Take 10 mg by mouth daily. 04/27/19 10/24/19  [provider]  sertraline (ZOLOFT) 50 MG tablet Take 50 mg by mouth daily. 04/27/19 07/29/19  [provider]  tamsulosin (FLOMAX) 0.4 MG CAPS capsule Take 0.4 mg by mouth daily. 05/05/19   [provider]  tiotropium (SPIRIVA HANDIHALER) 18 MCG inhalation capsule Place 1 capsule into inhaler and inhale daily. 05/15/19 11/11/19  [provider]  traZODone (DESYREL) 50 MG tablet Take 100 mg by mouth at bedtime. 05/15/19 08/13/19  [provider]    Physical Exam: Vitals:   12/26/21 2011 12/26/21 2013 12/26/21 2030 12/26/21 2210  BP: 101/60  (!) 90/54 105/68  Pulse: (!) 125  (!) 126 (!) 118  Resp: (!) 25  (!) 27 (!) 25  Temp: 98.2 F (36.8 C)     TempSrc: Oral     SpO2: 97%  94% 93%  Weight:  72.6 kg    Height:  $Remove'5\' 9"'pHZSqPD$  (1.753 m)    Physical Exam Vitals reviewed.  Constitutional:      General: He is not in acute distress.    Appearance: He is  ill-appearing.  HENT:     Head: Normocephalic and atraumatic.     Right Ear: External ear normal.     Left Ear: External ear normal.     Nose: No congestion or rhinorrhea.     Mouth/Throat:     Mouth: Mucous membranes are moist.  Eyes:     Extraocular Movements: Extraocular movements intact.     Pupils: Pupils are equal, round, and reactive to light.  Neck:     Vascular: No carotid bruit.  Cardiovascular:     Rate and Rhythm: Regular rhythm. Tachycardia present.     Pulses: Normal pulses.     Heart sounds: Normal heart sounds.  Pulmonary:     Effort: Pulmonary effort is normal.     Breath sounds: Normal breath sounds.  Abdominal:     General: Bowel sounds  are normal. There is no distension.     Palpations: Abdomen is soft. There is no mass.     Tenderness: There is no abdominal tenderness. There is no guarding.     Hernia: No hernia is present.  Genitourinary:    Comments: Foley with purulence in cathter still pouring out thick yellowish purulent urine. Musculoskeletal:     Right lower leg: No edema.     Left lower leg: No edema.  Skin:    General: Skin is warm.  Neurological:     General: No focal deficit present.     Mental Status: He is oriented to person, place, and time.     Cranial Nerves: Cranial nerves 2-12 are intact.  Psychiatric:        Attention and Perception: He is inattentive.        Mood and Affect: Mood normal.        Behavior: Behavior is slowed.     Data Reviewed: > CMP shows sodium 131 glucose 156 creatinine 3.63 alk phos 204, albumin 2.7, ALT of 55, total bili of 2.1, GFR of 18. > BNP of 317.2, CPK of 10, troponin of 67. > Lactic acid of 1.7.>  Urinalysis shows cloudy turbid urine with large leukocytes, more than 50 WBCs. > CBC shows a white count of 19.2, hemoglobin of 10.2, platelet count of 207. > Tylenol level less than 10, salicylate less than 7. > Respiratory panel pending. > EKG: Shows A-fib at 121. > Ethanol level is pending. > Urine drug screen is pending. > Indwelling Foley placed in the emergency room. >  CT abd non contrast pending- ? Hydronephrosis.   Assessment and Plan: * Sepsis ([place])- (present on admission) Patient has sepsis and renal dysfunction. Supportive care IV fluid normal saline at 150 cc/h for 1 day. As needed Tylenol for fevers or pain.  Urinary tract infection- (present on admission) Urinary tract infection with acute kidney injury. Patient continued on cefepime. Follow cultures and sensitivity and tailor therapy further.  AKI (acute kidney injury) ([place])- (present on admission) Lab Results  Component Value Date   CREATININE 3.63 (H) 12/26/2021   CREATININE 0.86  07/14/2019   CREATININE 0.87 02/08/2018  Continue with aggressive IV fluid rehydration Avoid any nephrotoxic agents or contrast studies.  Abnormal LFTs (liver function tests)- (present on admission) CMP Latest Ref Rng & Units 12/26/2021 07/14/2019 02/08/2018  Glucose 70 - 99 mg/dL 156(H) 150(H) 120(H)  BUN 8 - 23 mg/dL 65(H) 6 11  Creatinine 0.61 - 1.24 mg/dL 3.63(H) 0.86 0.87  Sodium 135 - 145 mmol/L 131(L) 144 137  Potassium 3.5 - 5.1 mmol/L 4.0 3.1(L) 3.5  Chloride  98 - 111 mmol/L 100 111 105  CO2 22 - 32 mmol/L 18(L) 19(L) 22  Calcium 8.9 - 10.3 mg/dL 8.7(L) 8.4(L) 8.8(L)  Total Protein 6.5 - 8.1 g/dL 8.1 8.2(H) 7.8  Total Bilirubin 0.3 - 1.2 mg/dL 2.1(H) 0.8 0.6  Alkaline Phos 38 - 126 U/L 204(H) 96 128(H)  AST 15 - 41 U/L 31 42(H) 52(H)  ALT 0 - 44 U/L 55(H) 29 50  Attribute patient's elevated bilirubin to sepsis. Avoid any hepatotoxic agents.   Elevated troponin- (present on admission) Attribute patient's elevated troponin to sepsis.   Essential hypertension- (present on admission) Blood pressure 105/68, pulse (!) 118, temperature 98.2 F (36.8 C), temperature source Oral, resp. rate (!) 25, height _0  (1.753 m), weight 72.6 kg, SpO2 93 %. Home meds of amlodipine held.  Elevated glucose- (present on admission) No history of diabetes mellitus type 2.  We will obtain an A1c.  Depression- (present on admission) Patient currently under involuntary commitment. We will hold patient sertraline due to elevated LFTs. We can resume once med rec is available and LFTs improve.  Atrial fibrillation with RVR ([place])- (present on admission) Pt presenting with new onset .afib rvr and no AC. Current cahd score of 1. Currently heparin q 12h. Due to urology procedure we will hold any anticoagulation currently and will start thereafter.    Ureteral stent displacement, initial encounter ([place])- (present on admission) Pt has right stent malposition. Urology consulted- Dr. Bernardo Heater  consulted and case d/w him. Ct does not show hydronephrosis but has h/o rt hydronephrosis.    Advance Care Planning:   Code Status: Full Code   Consults:  None   Family Communication:  Sherryll Burger (Friend)  [phone] (Mobile)  Severity of Illness: The appropriate patient status for this patient is INPATIENT. Inpatient status is judged to be reasonable and necessary in order to provide the required intensity of service to ensure the patient's safety. The patient's presenting symptoms, physical exam findings, and initial radiographic and laboratory data in the context of their chronic comorbidities is felt to place them at high risk for further clinical deterioration. Furthermore, it is not anticipated that the patient will be medically stable for discharge from the hospital within 2 midnights of admission.   * I certify that at the point of admission it is my clinical judgment that the patient will require inpatient hospital care spanning beyond 2 midnights from the point of admission due to high intensity of service, high risk for further deterioration and high frequency of surveillance required.*  Author: Para Skeans, MD 12/27/2021 1:46 AM  For on call review [email or web address].

## 2021-12-26 NOTE — Assessment & Plan Note (Addendum)
Elevated troponin due to demand ischemia in the setting of sepsis.  Patient is having no chest pain and EKG nonacute.   Outpatient cardiology follow-up.

## 2021-12-26 NOTE — ED Triage Notes (Signed)
Pt to ED via EMS from home under IVC with paperwork that states patient is dx bipolar, recently in hospital, EMS checked on patient in home but is living in poor conditions and urinating and deficating on self, refusing to go to hospital so is deemed danger to himself in current situation.  EMS reports patient left [facility] AMA on Friday for kidney stones and COVID + a couple weeks ago, has not been getting up from bed, unable to self cath and last urinated yesterday.  EMS vitals 146/98 BP, a.fib 110-140 HR, 98.7 temp, 166 CBG, 98% RA.  Pt A&Ox4, denies SI/HI or hallucinations.

## 2021-12-26 NOTE — ED Provider Notes (Signed)
[facility] Provider Note    Event Date/Time   First MD Initiated Contact with Patient 12/26/21 2022     (approximate)   History   Failure To Thrive   HPI  Wayne Burns is a 66 y.o. male with COPD, hypertension with bipolar who comes in under IVC due to poor conditions of living, urinating and defecating on self and refusing to go to the hospital so was deemed to be a danger to himself in the current situation.  Patient reportedly left [facility] AMA on Friday for kidney stones and COVID-positive a couple weeks ago patient's been unable to get up from bed and unable to self cath and last urinated on himself yesterday.  Patient is alert and oriented x3.  He states he has been feeling more weak and a bit unavailable to take care of himself.  He still able to move all his extremities and denies any falls.  Denies any shortness of breath or chest pain.  He just states that he feels a little nauseous.  He reports that the last time he self cath was yesterday.  It sounds like patient was refusing to go to the ER therefore the police and EMS and other people placed him under IVC due to the severe living conditions  Patient was reportedly at [facility] by his not able to see any of the records   Physical Exam   Triage Vital Signs: ED Triage Vitals  Enc Vitals Group     BP 12/26/21 2011 101/60     Pulse Rate 12/26/21 2011 (!) 125     Resp 12/26/21 2011 (!) 25     Temp 12/26/21 2011 98.2 F (36.8 C)     Temp Source 12/26/21 2011 Oral     SpO2 12/26/21 2011 97 %     Weight 12/26/21 2013 160 lb (72.6 kg)     Height 12/26/21 2013 5\' 9"  (1.753 m)     Head Circumference --      Peak Flow --      Pain Score 12/26/21 2013 0     Pain Loc --      Pain Edu? --      Excl. in [place]? --     Most recent vital signs: Vitals:   12/26/21 2011  BP: 101/60  Pulse: (!) 125  Resp: (!) 25  Temp: 98.2 F (36.8 C)  SpO2: 97%     General: Awake, no distress.  CV:  Good peripheral  perfusion.  Irregular, tachycardic Resp:  Normal effort.  Abd:  No distention.  Soft and nontender Other:  Patient's moving all of his extremities with equal strength.  He is very disheveled with stool noted on him.   ED Results / Procedures / Treatments   Labs (all labs ordered are listed, but only abnormal results are displayed) Labs Reviewed  CBC - Abnormal; Notable for the following components:      Result Value   WBC 19.2 (*)    RBC 3.61 (*)    Hemoglobin 10.2 (*)    HCT 32.2 (*)    RDW 18.0 (*)    All other components within normal limits  RESP PANEL BY RT-PCR (FLU A&B, COVID) ARPGX2  COMPREHENSIVE METABOLIC PANEL  ETHANOL  SALICYLATE LEVEL  ACETAMINOPHEN LEVEL  URINE DRUG SCREEN, QUALITATIVE ([facility] ONLY)  URINALYSIS, ROUTINE W REFLEX MICROSCOPIC  LACTIC ACID, PLASMA  LACTIC ACID, PLASMA  CK  BRAIN NATRIURETIC PEPTIDE  CBG MONITORING, ED  TROPONIN I (HIGH  SENSITIVITY)     EKG  My interpretation of EKG:  Atrial fibrillation rate of 121 without any ST elevation or T wave inversions, normal intervals  RADIOLOGY I have reviewed the xray personally and no PNA    PROCEDURES:  Critical Care performed: Yes, see critical care procedure note(s)  .1-3 Lead EKG Interpretation Performed by: Vanessa [place], MD Authorized by: Vanessa [place], MD     Interpretation: abnormal     ECG rate:  118   ECG rate assessment: tachycardic     Rhythm: atrial fibrillation     Ectopy: none     Conduction: normal   .Critical Care Performed by: Vanessa [place], MD Authorized by: Vanessa [place], MD   Critical care provider statement:    Critical care time (minutes):  30   Critical care was necessary to treat or prevent imminent or life-threatening deterioration of the following conditions:  Sepsis   Critical care was time spent personally by me on the following activities:  Development of treatment plan with patient or surrogate, discussions with consultants, evaluation of patient's  response to treatment, examination of patient, ordering and review of laboratory studies, ordering and review of radiographic studies, ordering and performing treatments and interventions, pulse oximetry, re-evaluation of patient's condition and review of old charts   MEDICATIONS ORDERED IN ED: Medications  sodium chloride 0.9 % bolus 1,000 mL (has no administration in time range)  ondansetron (ZOFRAN) injection 4 mg (has no administration in time range)  ceFEPIme (MAXIPIME) 2 g in sodium chloride 0.9 % 100 mL IVPB (has no administration in time range)     IMPRESSION / MDM / ASSESSMENT AND PLAN / ED COURSE  I reviewed the triage vital signs and the nursing notes.                              Patient comes in under IVC with failure to thrive, not taking care of himself after recent discharge from [facility].  Unable to see the admission details.  However patient does report not cathing due to just feeling too weak.  He denies any falls, hitting his head.  Foley catheter was placed with thick purulent urine coming out of it.  Patient had 1.9 L.  Patient reports resolution of any discomfort.  His abdomen is soft nontender he denies any back pain.  I have low suspicion that patient has a infected kidney stone or acute abdominal process.  Patient is alert and oriented x3 and moving all extremities so I also have very low suspicion that he has any kind of intracranial hemorrhage.  He has no evidence of trauma and he denies any falls.  He reports that he has not been drinking alcohol for a long period of time.  Labs ordered to evaluate for Electra abnormalities, AKI and patient was noted to have significant AKI most likely from the urinary retention.  His LFTs are slightly elevated but again he is got no abdominal tenderness.  His CBC shows elevated white count consistent with my concern for infection.  I have ordered blood cultures, lactate.  Lactate was normal.  Patient CK was also normal therefore no evidence  of rhabdo.  On repeat assessment patient is feeling much more comfortable after Foley was in place and denies any pain.  He is in new A-fib with RVR which I suspect is demand secondary to the urinary retention and UTI, sepsis.  I ordered 1  L of fluid but do not want to beta-blocker or calcium channel blocker at this time due to my concerns for acute infection.  We will continue to closely monitor if needs additional medications  I did discuss with the hospital team for admission  The patient is on the cardiac monitor to evaluate for evidence of arrhythmia and/or significant heart rate changes.   FINAL CLINICAL IMPRESSION(S) / ED DIAGNOSES   Final diagnoses:  AKI (acute kidney injury) ([place])  Sepsis, due to unspecified organism, unspecified whether acute organ dysfunction present ([place])  Acute cystitis without hematuria  Atrial fibrillation with rapid ventricular response ([place])     Rx / DC Orders   ED Discharge Orders     None        Note:  This document was prepared using Dragon voice recognition software and may include unintentional dictation errors.   Vanessa [place], MD 12/26/21 2236

## 2021-12-26 NOTE — Assessment & Plan Note (Addendum)
Acute kidney injury likely on chronic kidney disease stage IV.  After 3 doses of torsemide, Cr went up to 4.75 on 01/07/2022.  Given a fluid bolus and Cr improved to 2.76.   Recent baseline Cr at [facility] on 12/22/2021 was 2.8.  Patient does in and out catheterizations.  Creatinine today 2.53 -stable

## 2021-12-27 DIAGNOSIS — B9620 Unspecified Escherichia coli [E. coli] as the cause of diseases classified elsewhere: Secondary | ICD-10-CM

## 2021-12-27 DIAGNOSIS — U071 COVID-19: Secondary | ICD-10-CM

## 2021-12-27 DIAGNOSIS — B377 Candidal sepsis: Secondary | ICD-10-CM | POA: Diagnosis present

## 2021-12-27 DIAGNOSIS — D638 Anemia in other chronic diseases classified elsewhere: Secondary | ICD-10-CM | POA: Diagnosis not present

## 2021-12-27 DIAGNOSIS — J441 Chronic obstructive pulmonary disease with (acute) exacerbation: Secondary | ICD-10-CM

## 2021-12-27 DIAGNOSIS — M86671 Other chronic osteomyelitis, right ankle and foot: Secondary | ICD-10-CM | POA: Diagnosis present

## 2021-12-27 DIAGNOSIS — A498 Other bacterial infections of unspecified site: Secondary | ICD-10-CM | POA: Diagnosis not present

## 2021-12-27 DIAGNOSIS — I4891 Unspecified atrial fibrillation: Secondary | ICD-10-CM | POA: Diagnosis present

## 2021-12-27 DIAGNOSIS — N3000 Acute cystitis without hematuria: Secondary | ICD-10-CM

## 2021-12-27 DIAGNOSIS — N179 Acute kidney failure, unspecified: Secondary | ICD-10-CM | POA: Diagnosis present

## 2021-12-27 DIAGNOSIS — E1122 Type 2 diabetes mellitus with diabetic chronic kidney disease: Secondary | ICD-10-CM | POA: Diagnosis present

## 2021-12-27 DIAGNOSIS — Z87891 Personal history of nicotine dependence: Secondary | ICD-10-CM | POA: Diagnosis not present

## 2021-12-27 DIAGNOSIS — E872 Acidosis, unspecified: Secondary | ICD-10-CM | POA: Diagnosis present

## 2021-12-27 DIAGNOSIS — A419 Sepsis, unspecified organism: Secondary | ICD-10-CM | POA: Diagnosis present

## 2021-12-27 DIAGNOSIS — N201 Calculus of ureter: Secondary | ICD-10-CM

## 2021-12-27 DIAGNOSIS — T83122A Displacement of urinary stent, initial encounter: Secondary | ICD-10-CM | POA: Diagnosis not present

## 2021-12-27 DIAGNOSIS — T8389XA Other specified complication of genitourinary prosthetic devices, implants and grafts, initial encounter: Secondary | ICD-10-CM | POA: Diagnosis not present

## 2021-12-27 DIAGNOSIS — D631 Anemia in chronic kidney disease: Secondary | ICD-10-CM | POA: Diagnosis present

## 2021-12-27 DIAGNOSIS — F320 Major depressive disorder, single episode, mild: Secondary | ICD-10-CM | POA: Diagnosis not present

## 2021-12-27 DIAGNOSIS — D696 Thrombocytopenia, unspecified: Secondary | ICD-10-CM | POA: Diagnosis not present

## 2021-12-27 DIAGNOSIS — N184 Chronic kidney disease, stage 4 (severe): Secondary | ICD-10-CM | POA: Diagnosis present

## 2021-12-27 DIAGNOSIS — R7881 Bacteremia: Secondary | ICD-10-CM

## 2021-12-27 DIAGNOSIS — T83122S Displacement of urinary stent, sequela: Secondary | ICD-10-CM | POA: Diagnosis not present

## 2021-12-27 DIAGNOSIS — N390 Urinary tract infection, site not specified: Secondary | ICD-10-CM

## 2021-12-27 DIAGNOSIS — I213 ST elevation (STEMI) myocardial infarction of unspecified site: Secondary | ICD-10-CM | POA: Diagnosis present

## 2021-12-27 DIAGNOSIS — Z1612 Extended spectrum beta lactamase (ESBL) resistance: Secondary | ICD-10-CM

## 2021-12-27 DIAGNOSIS — G9341 Metabolic encephalopathy: Secondary | ICD-10-CM | POA: Diagnosis present

## 2021-12-27 DIAGNOSIS — B379 Candidiasis, unspecified: Secondary | ICD-10-CM | POA: Diagnosis not present

## 2021-12-27 DIAGNOSIS — R6520 Severe sepsis without septic shock: Secondary | ICD-10-CM | POA: Diagnosis not present

## 2021-12-27 DIAGNOSIS — R7989 Other specified abnormal findings of blood chemistry: Secondary | ICD-10-CM | POA: Diagnosis not present

## 2021-12-27 DIAGNOSIS — R627 Adult failure to thrive: Secondary | ICD-10-CM | POA: Diagnosis present

## 2021-12-27 DIAGNOSIS — J449 Chronic obstructive pulmonary disease, unspecified: Secondary | ICD-10-CM | POA: Diagnosis present

## 2021-12-27 DIAGNOSIS — E871 Hypo-osmolality and hyponatremia: Secondary | ICD-10-CM | POA: Diagnosis present

## 2021-12-27 DIAGNOSIS — R6521 Severe sepsis with septic shock: Secondary | ICD-10-CM | POA: Diagnosis present

## 2021-12-27 DIAGNOSIS — I130 Hypertensive heart and chronic kidney disease with heart failure and stage 1 through stage 4 chronic kidney disease, or unspecified chronic kidney disease: Secondary | ICD-10-CM | POA: Diagnosis present

## 2021-12-27 DIAGNOSIS — I5042 Chronic combined systolic (congestive) and diastolic (congestive) heart failure: Secondary | ICD-10-CM | POA: Diagnosis present

## 2021-12-27 DIAGNOSIS — Y732 Prosthetic and other implants, materials and accessory gastroenterology and urology devices associated with adverse incidents: Secondary | ICD-10-CM | POA: Diagnosis not present

## 2021-12-27 DIAGNOSIS — A4151 Sepsis due to Escherichia coli [E. coli]: Secondary | ICD-10-CM | POA: Diagnosis present

## 2021-12-27 DIAGNOSIS — F32A Depression, unspecified: Secondary | ICD-10-CM | POA: Diagnosis present

## 2021-12-27 LAB — COMPREHENSIVE METABOLIC PANEL
ALT: 44 U/L (ref 0–44)
AST: 28 U/L (ref 15–41)
Albumin: 2.2 g/dL — ABNORMAL LOW (ref 3.5–5.0)
Alkaline Phosphatase: 167 U/L — ABNORMAL HIGH (ref 38–126)
Anion gap: 10 (ref 5–15)
BUN: 64 mg/dL — ABNORMAL HIGH (ref 8–23)
CO2: 17 mmol/L — ABNORMAL LOW (ref 22–32)
Calcium: 8 mg/dL — ABNORMAL LOW (ref 8.9–10.3)
Chloride: 105 mmol/L (ref 98–111)
Creatinine, Ser: 3.82 mg/dL — ABNORMAL HIGH (ref 0.61–1.24)
GFR, Estimated: 17 mL/min — ABNORMAL LOW (ref >60)
Glucose, Bld: 138 mg/dL — ABNORMAL HIGH (ref 70–99)
Potassium: 4.2 mmol/L (ref 3.5–5.1)
Sodium: 132 mmol/L — ABNORMAL LOW (ref 135–145)
Total Bilirubin: 1.5 mg/dL — ABNORMAL HIGH (ref 0.3–1.2)
Total Protein: 6.8 g/dL (ref 6.5–8.1)

## 2021-12-27 LAB — BLOOD CULTURE ID PANEL (REFLEXED) - BCID2

## 2021-12-27 LAB — CBC
HCT: 25.6 % — ABNORMAL LOW (ref 39.0–52.0)
Hemoglobin: 8.3 g/dL — ABNORMAL LOW (ref 13.0–17.0)
MCH: 28.9 pg (ref 26.0–34.0)
MCHC: 32.4 g/dL (ref 30.0–36.0)
MCV: 89.2 fL (ref 80.0–100.0)
Platelets: 156 10*3/uL (ref 150–400)
RBC: 2.87 MIL/uL — ABNORMAL LOW (ref 4.22–5.81)
RDW: 17.8 % — ABNORMAL HIGH (ref 11.5–15.5)
WBC: 14.4 10*3/uL — ABNORMAL HIGH (ref 4.0–10.5)
nRBC: 0 % (ref 0.0–0.2)

## 2021-12-27 LAB — PROCALCITONIN: Procalcitonin: 1.78 ng/mL

## 2021-12-27 LAB — TROPONIN I (HIGH SENSITIVITY)
Troponin I (High Sensitivity): 52 ng/L — ABNORMAL HIGH (ref <18)
Troponin I (High Sensitivity): 54 ng/L — ABNORMAL HIGH (ref <18)

## 2021-12-27 LAB — LACTIC ACID, PLASMA: Lactic Acid, Venous: 1.1 mmol/L (ref 0.5–1.9)

## 2021-12-27 LAB — HIV ANTIBODY (ROUTINE TESTING W REFLEX): HIV Screen 4th Generation wRfx: NONREACTIVE

## 2021-12-27 LAB — RESP PANEL BY RT-PCR (FLU A&B, COVID) ARPGX2
Influenza A by PCR: NEGATIVE
Influenza B by PCR: NEGATIVE
SARS Coronavirus 2 by RT PCR: POSITIVE — AB

## 2021-12-27 MED ORDER — ONDANSETRON HCL 4 MG/2ML IJ SOLN: 4.0000 mg | Freq: Four times a day (QID) | INTRAMUSCULAR | Status: DC | PRN

## 2021-12-27 MED ORDER — ACETAMINOPHEN 325 MG PO TABS
650.0000 mg | ORAL_TABLET | Freq: Four times a day (QID) | ORAL | Status: DC | PRN
  Administered 2021-12-27 – 2022-01-07 (×4): 650 mg via ORAL
  Filled 2021-12-27 (×5): qty 2

## 2021-12-27 MED ORDER — HEPARIN SODIUM (PORCINE) 5000 UNIT/ML IJ SOLN
5000.0000 [IU] | Freq: Two times a day (BID) | INTRAMUSCULAR | Status: DC
  Administered 2021-12-27 – 2022-01-05 (×19): 5000 [IU] via SUBCUTANEOUS
  Filled 2021-12-27 (×19): qty 1

## 2021-12-27 MED ORDER — ONDANSETRON HCL 4 MG PO TABS: 4.0000 mg | ORAL_TABLET | Freq: Four times a day (QID) | ORAL | Status: DC | PRN

## 2021-12-27 MED ORDER — SODIUM CHLORIDE 0.9 % IV SOLN
250.0000 mL | INTRAVENOUS | Status: DC
  Administered 2022-01-02: 250 mL via INTRAVENOUS

## 2021-12-27 MED ORDER — SODIUM CHLORIDE 0.9 % IV SOLN: 1.0000 g | INTRAVENOUS | Status: DC

## 2021-12-27 MED ORDER — MIDODRINE HCL 5 MG PO TABS
10.0000 mg | ORAL_TABLET | Freq: Three times a day (TID) | ORAL | Status: DC
  Administered 2021-12-27 – 2022-01-01 (×16): 10 mg via ORAL
  Filled 2021-12-27 (×16): qty 2

## 2021-12-27 MED ORDER — SODIUM CHLORIDE 0.9% FLUSH
3.0000 mL | Freq: Two times a day (BID) | INTRAVENOUS | Status: DC
  Administered 2021-12-27 – 2022-01-14 (×23): 3 mL via INTRAVENOUS

## 2021-12-27 MED ORDER — TIOTROPIUM BROMIDE MONOHYDRATE 18 MCG IN CAPS
1.0000 | ORAL_CAPSULE | Freq: Every day | RESPIRATORY_TRACT | Status: DC
  Administered 2021-12-29 – 2022-01-14 (×16): 18 ug via RESPIRATORY_TRACT
  Filled 2021-12-27 (×7): qty 5

## 2021-12-27 MED ORDER — SODIUM CHLORIDE 0.9 % IV BOLUS
1000.0000 mL | Freq: Once | INTRAVENOUS | Status: AC
  Administered 2021-12-27: 1000 mL via INTRAVENOUS

## 2021-12-27 MED ORDER — LEVALBUTEROL HCL 1.25 MG/0.5ML IN NEBU: 1.2500 mg | INHALATION_SOLUTION | Freq: Three times a day (TID) | RESPIRATORY_TRACT | Status: DC

## 2021-12-27 MED ORDER — SODIUM CHLORIDE 0.9 % IV SOLN
500.0000 mg | Freq: Two times a day (BID) | INTRAVENOUS | Status: DC
  Administered 2021-12-28 – 2021-12-29 (×5): 500 mg via INTRAVENOUS
  Filled 2021-12-27: qty 10
  Filled 2021-12-27 (×2): qty 500
  Filled 2021-12-27: qty 10
  Filled 2021-12-27: qty 500
  Filled 2021-12-27 (×3): qty 10

## 2021-12-27 MED ORDER — ACETAMINOPHEN 650 MG RE SUPP: 650.0000 mg | Freq: Four times a day (QID) | RECTAL | Status: DC | PRN

## 2021-12-27 MED ORDER — SODIUM CHLORIDE 0.9 % IV SOLN
1.0000 g | Freq: Once | INTRAVENOUS | Status: AC
  Administered 2021-12-27: 1 g via INTRAVENOUS
  Filled 2021-12-27: qty 20

## 2021-12-27 MED ORDER — AMIODARONE HCL IN DEXTROSE 360-4.14 MG/200ML-% IV SOLN
30.0000 mg/h | INTRAVENOUS | Status: DC
  Administered 2021-12-27 – 2021-12-29 (×4): 30 mg/h via INTRAVENOUS
  Filled 2021-12-27 (×3): qty 200

## 2021-12-27 MED ORDER — DEXTROSE 5 % IV SOLN
INTRAVENOUS | Status: DC
  Filled 2021-12-27: qty 150
  Filled 2021-12-27: qty 1000
  Filled 2021-12-27: qty 150
  Filled 2021-12-27 (×6): qty 1000
  Filled 2021-12-27: qty 150
  Filled 2021-12-27 (×2): qty 1000

## 2021-12-27 MED ORDER — SODIUM CHLORIDE 0.9% FLUSH
3.0000 mL | Freq: Two times a day (BID) | INTRAVENOUS | Status: DC
  Administered 2021-12-27 – 2022-01-11 (×26): 3 mL via INTRAVENOUS

## 2021-12-27 MED ORDER — PANTOPRAZOLE SODIUM 40 MG IV SOLR
40.0000 mg | Freq: Two times a day (BID) | INTRAVENOUS | Status: DC
  Administered 2021-12-27 – 2021-12-29 (×6): 40 mg via INTRAVENOUS
  Filled 2021-12-27 (×6): qty 10

## 2021-12-27 MED ORDER — AMIODARONE HCL IN DEXTROSE 360-4.14 MG/200ML-% IV SOLN
60.0000 mg/h | INTRAVENOUS | Status: AC
  Administered 2021-12-27 (×2): 60 mg/h via INTRAVENOUS
  Filled 2021-12-27 (×2): qty 200

## 2021-12-27 MED ORDER — LEVALBUTEROL TARTRATE 45 MCG/ACT IN AERO
2.0000 | INHALATION_SPRAY | Freq: Three times a day (TID) | RESPIRATORY_TRACT | Status: DC
  Administered 2021-12-27 – 2022-01-13 (×45): 2 via RESPIRATORY_TRACT
  Filled 2021-12-27: qty 15

## 2021-12-27 MED ORDER — SODIUM CHLORIDE 0.9 % IV SOLN
250.0000 mL | INTRAVENOUS | Status: DC | PRN
  Administered 2021-12-30: 250 mL via INTRAVENOUS

## 2021-12-27 MED ORDER — TAMSULOSIN HCL 0.4 MG PO CAPS
0.4000 mg | ORAL_CAPSULE | Freq: Every day | ORAL | Status: DC
Start: 2021-12-27 — End: 2022-01-14
  Administered 2021-12-27 – 2022-01-14 (×19): 0.4 mg via ORAL
  Filled 2021-12-27 (×19): qty 1

## 2021-12-27 MED ORDER — SODIUM CHLORIDE 0.9 % IV SOLN: 2.0000 g | INTRAVENOUS | Status: DC

## 2021-12-27 MED ORDER — AMIODARONE LOAD VIA INFUSION
150.0000 mg | Freq: Once | INTRAVENOUS | Status: AC
  Administered 2021-12-27: 150 mg via INTRAVENOUS
  Filled 2021-12-27: qty 83.34

## 2021-12-27 MED ORDER — SODIUM CHLORIDE 0.9 % IV SOLN: INTRAVENOUS | Status: DC

## 2021-12-27 MED ORDER — METHYLPREDNISOLONE SODIUM SUCC 125 MG IJ SOLR
80.0000 mg | INTRAMUSCULAR | Status: DC
  Administered 2021-12-27: 80 mg via INTRAVENOUS
  Filled 2021-12-27: qty 2

## 2021-12-27 MED ORDER — SODIUM CHLORIDE 0.9% FLUSH: 3.0000 mL | INTRAVENOUS | Status: DC | PRN

## 2021-12-27 MED ORDER — PHENYLEPHRINE HCL-NACL 20-0.9 MG/250ML-% IV SOLN
25.0000 ug/min | INTRAVENOUS | Status: DC
  Administered 2021-12-27: 65 ug/min via INTRAVENOUS
  Administered 2021-12-27: 25 ug/min via INTRAVENOUS
  Administered 2021-12-27: 35 ug/min via INTRAVENOUS
  Administered 2021-12-27: 55 ug/min via INTRAVENOUS
  Administered 2021-12-27: 45 ug/min via INTRAVENOUS
  Administered 2021-12-28 (×3): 65 ug/min via INTRAVENOUS
  Administered 2021-12-29: 40 ug/min via INTRAVENOUS
  Administered 2021-12-29: 65 ug/min via INTRAVENOUS
  Filled 2021-12-27 (×9): qty 250

## 2021-12-27 NOTE — Consult Note (Cosign Needed Addendum)
[place] NOTE       Patient ID: Wayne Burns MRN: [id number] DOB/AGE: [birth year] 66 y.o.  Admit date: 12/26/2021 Referring Physician Dr. Shelly Coss  Primary Physician Dr. Earlie Counts [facility]  Primary Cardiologist  Reason for Consultation AF RVR  HPI: The patient is a 66 year old male with a past medical history notable for hypertension, COPD who was brought in by EMS under IVC by his family members for not being able to take care of himself.  He was apparently living in very poor conditions, refusing to go to the hospital and was deemed a danger to himself. On arrival to [facility] ED 2/21 patient met sepsis criteria and in atrial fibrillation with RVR.  Cardiology is consulted for further assistance of his A-fib.  I am unable to see records from his most recent hospitalization at [facility] using care everywhere however, per review of urology notes, the patient was hospitalized for 10 days at [facility] and was discharged on 2/17. He underwent bilateral ureteral stent placements during that hospitalization and was planned to have definitive stone treatment within the next 3 months.  During the hospitalization cardiology follow-up was recommended to discuss complex PCI placement as apparently EKG demonstrated new ST elevation in inferior leads which was thought to be type II event in the setting of CAD, sepsis, renal failure, and respiratory failure.  During interview this morning the patient states he "felt horrible over the weekend" and so he came back to the hospital.  He says the only thing bothering him is heartburn.  Denies chest pain, shortness of breath, cough, presyncope, dizziness, lower extremity edema.  He is audibly wheezing during interview and says he takes inhalers for it "sometimes." He thinks he had a cardiac cath with stent placement in 2005.  Vital signs notable for blood pressure of 97/56, heart rate between 110-127 during interview.  He has remained in atrial  fibrillation since his presentation to the ED and denies a prior history of it.  No rate control medications have been attempted to be given due to his low blood pressure.  Labs on admission are notable for sodium of 132, creatinine of 3.82, GFR 17.  Alk phos 164.  BNP 317, troponins 67-70.  WBCs elevated but downtrending 19.2-14.4.  H&H downtrending from 10.2-8.3.  Current platelets 156.  Review of systems complete and found to be negative unless listed above     Past Medical History:  Diagnosis Date   COPD (chronic obstructive pulmonary disease) ([place])    Hypertension     Past Surgical History:  Procedure Laterality Date   ANKLE SURGERY      (Not in a hospital admission)  Social History   Socioeconomic History   Marital status: Single    Spouse name: Not on file   Number of children: Not on file   Years of education: Not on file   Highest education level: Not on file  Occupational History   Not on file  Tobacco Use   Smoking status: Former    Packs/day: 0.50    Types: Cigarettes   Smokeless tobacco: Never  Vaping Use   Vaping Use: Never used  Substance and Sexual Activity   Alcohol use: Not Currently    Comment: often   Drug use: Not Currently   Sexual activity: Not on file  Other Topics Concern   Not on file  Social History Narrative   Not on file   Social Determinants of Health   Financial Resource Strain: Not  on file  Food Insecurity: Not on file  Transportation Needs: Not on file  Physical Activity: Not on file  Stress: Not on file  Social Connections: Not on file  Intimate Partner Violence: Not on file    History reviewed. No pertinent family history.    Review of systems complete and found to be negative unless listed above    PHYSICAL EXAM General: Disheveled and ill-appearing Caucasian male, in no acute distress.  Sitting upright in ED stretcher wearing only blankets. HEENT:  Normocephalic and atraumatic. Neck:  No JVD.  Lungs: Normal  respiratory effort on room air.  Audible expiratory wheezes.  No crackles. Heart: Irregularly irregular rate and rhythm. Normal S1 and S2 without gallops or murmurs. Radial & DP pulses 2+ bilaterally. Abdomen: Obese appearing appearing.  Msk: Normal strength and tone for age. Extremities: Warm and well perfused. No clubbing, cyanosis.  Right upper arm with ecchymosis. no lower extremity edema. Neuro: Alert and oriented X 3. Psych:  Answers questions appropriately.   Labs:   Lab Results  Component Value Date   WBC 14.4 (H) 12/27/2021   HGB 8.3 (L) 12/27/2021   HCT 25.6 (L) 12/27/2021   MCV 89.2 12/27/2021   PLT 156 12/27/2021    Recent Labs  Lab 12/27/21 0532  NA 132*  K 4.2  CL 105  CO2 17*  BUN 64*  CREATININE 3.82*  CALCIUM 8.0*  PROT 6.8  BILITOT 1.5*  ALKPHOS 167*  ALT 44  AST 28  GLUCOSE 138*   Lab Results  Component Value Date   CKTOTAL 10 (L) 12/26/2021   No results found for: CHOL No results found for: HDL No results found for: LDLCALC No results found for: TRIG No results found for: CHOLHDL No results found for: LDLDIRECT    Radiology: CT ABDOMEN PELVIS WO CONTRAST  Addendum Date: 12/26/2021   ADDENDUM REPORT: 12/26/2021 23:44 ADDENDUM: It should be noted that the air seen within the wall of the urinary bladder may represent sequelae associated with the previously described malpositioned right-sided endo ureteral stent. Electronically Signed   By: Virgina Norfolk M.D.   On: 12/26/2021 23:44   Result Date: 12/26/2021 CLINICAL DATA:  Patient with altered mentation and recent history of COVID positive. EXAM: CT ABDOMEN AND PELVIS WITHOUT CONTRAST TECHNIQUE: Multidetector CT imaging of the abdomen and pelvis was performed following the standard protocol without IV contrast. RADIATION DOSE REDUCTION: This exam was performed according to the departmental dose-optimization program which includes automated exposure control, adjustment of the mA and/or kV according  to patient size and/or use of iterative reconstruction technique. COMPARISON:  July 14, 2019 FINDINGS: Lower chest: No acute abnormality. Hepatobiliary: No focal liver abnormality is seen. No gallstones, gallbladder wall thickening, or biliary dilatation. Pancreas: Unremarkable. No pancreatic ductal dilatation or surrounding inflammatory changes. Spleen: There is mild to moderate severity splenomegaly. Adrenals/Urinary Tract: Adrenal glands are unremarkable. Kidneys are normal in size, without focal lesions. Bilateral endo ureteral stents are noted. The proximal portion of the right-sided endo ureteral stent is seen inferior to the right renal pelvis (coronal reformatted images 46 through 50, CT series 5), while the left-sided endo ureteral stent is properly positioned. Ill-defined subcentimeter renal calculi are seen within the right renal pelvis which is mildly dilated. Numerous subcentimeter non-obstructing renal calculi are seen within the left kidney. There is marked severity diffuse urinary bladder wall thickening with air suspected within the lateral aspect of the bladder wall on the right. A Foley catheter is also present within  the bladder lumen. Stomach/Bowel: Stomach is within normal limits. Appendix appears normal. No evidence of bowel wall thickening, distention, or inflammatory changes. Vascular/Lymphatic: Aortic atherosclerosis. No enlarged abdominal or pelvic lymph nodes. Reproductive: The prostate gland is remarkable. Other: No abdominal wall hernia or abnormality. No abdominopelvic ascites. Musculoskeletal: Multilevel degenerative changes seen throughout the lumbar spine. IMPRESSION: 1. Bilateral endo ureteral stents, with a malpositioned right-sided endo ureteral stent, as described above. Surgical an urology consultation is recommended. 2. Marked severity diffuse urinary bladder wall thickening with air suspected within the lateral aspect of the bladder wall on the right which may represent  sequelae associated with cystitis. The presence of an underlying neoplastic process cannot be excluded. 3. Renal calculi within the right renal pelvis with numerous subcentimeter non-obstructing left renal calculi. 4. Splenomegaly. 5. Aortic atherosclerosis. Aortic Atherosclerosis (ICD10-I70.0). Electronically Signed: By: Virgina Norfolk M.D. On: 12/26/2021 23:40   DG Chest Portable 1 View  Result Date: 12/26/2021 CLINICAL DATA:  New atrial fibrillation. EXAM: PORTABLE CHEST 1 VIEW COMPARISON:  Chest radiograph 10/14/2019 FINDINGS: Stable heart size allowing for differences in technique. Unchanged mediastinal contours. Aortic atherosclerosis and coronary stent. Mild hyperinflation and bronchial thickening, chronic. No acute airspace disease. No pulmonary edema. No pleural effusion or pneumothorax no acute osseous findings. IMPRESSION: 1. No acute chest findings. 2. Mild chronic hyperinflation and bronchial thickening. 3. Aortic atherosclerosis and coronary stent. Electronically Signed   By: Keith Rake M.D.   On: 12/26/2021 20:46    ECHO no prior available to review  TELEMETRY reviewed by me: Atrial fibrillation with rate peaking in the 140s, between 110-125 during interview with occasional PVCs  EKG reviewed by me: Atrial fibrillation rate 121, old inferior infarct  ASSESSMENT AND PLAN:  The patient is a 66 year old male with a past medical history notable for hypertension, COPD who was brought in by EMS under IVC by his family members for not being able to take care of himself.  He was apparently living in very poor conditions, refusing to go to the hospital and was deemed a danger to himself. On arrival to [facility] ED 2/21 patient met sepsis criteria and in atrial fibrillation with RVR.  Cardiology is consulted for further assistance of his A-fib.  #New onset atrial fibrillation with RVR #Sepsis 2/2 urinary tract infection #COVID-19, asymptomatic Patient presents to [facility] ED after a 10-day  hospitalization at [facility] for treatment of kidney stones. He presents to [facility] ED 4 days after discharge "feeling horrible" and met sepsis criteria on admission.  He was also in A-fib with rates peaking in the 140s, blood pressure too soft to start diltiazem or metoprolol.  His troponin is minimally elevated to 67-70 thought to be 2/2 demand ischemia from sepsis and his tachycardia.   -Agree with treatment of UTI per primary team. -Defer metoprolol, diltiazem due to hypotension (current blood pressure 97/56) -Ordered Amio load and infusion for 24 hours for rate control and hopeful conversion to NSR -agree with midodrine 25m TID -Ordered echocardiogram complete -CHA2DS2-VASc is 1  #Hypertension On amlodipine at home, holding due to hypotension.  #CAD EKG shows A-fib with rate of 121, old inferior infarct to be [facility]  Currently chest pain-free.  He will need further ischemic outpatient work-up on an outpatient basis once he is through this acute illness.  #Acute kidney injury Creatinine 3.82, EGFR 17, will continue to monitor  This patient's plan of care was discussed and created with Dr. DLujean Ameland he is  in agreement.  Signed: Tristan Schroeder , PA-C 12/27/2021, 10:29 AM [facility] Cardiology

## 2021-12-27 NOTE — ED Notes (Addendum)
This RN to bedside for pressure of 67/49, pt. Laid back, nephrology at bedside. Dr. Jenell Milliner notified, awaiting orders. Dr. Holley Raring, neph at bedside. VO for

## 2021-12-27 NOTE — Assessment & Plan Note (Addendum)
Previous diagnosis at [facility].  Asymptomatic.

## 2021-12-27 NOTE — Progress Notes (Signed)
PHARMACY - PHYSICIAN COMMUNICATION CRITICAL VALUE ALERT - BLOOD CULTURE IDENTIFICATION (BCID)  Wayne Burns is an 66 y.o. male who presented to [facility] on 12/26/2021 with a chief complaint of not caring for himself.  Urinary retention  Assessment:  2/21 blood culture with GNR, BCID detects ESBL E coli. Source is urinary.  Recent admission at [facility] (left AMA).  CT at [facility] showed bilateral ureteral obstruction.  Appears to had BL stents placed.    Name of physician (or Provider) Contacted: Dr Tawanna Solo  Current antibiotics: Ceftriaxone  Changes to prescribed antibiotics recommended:  Recommendations accepted by provider  Results for orders placed or performed during the hospital encounter of 12/26/21  Blood Culture ID Panel (Reflexed) (Collected: 12/26/2021 10:46 PM)  Result Value Ref Range   Enterococcus faecalis NOT DETECTED NOT DETECTED   Enterococcus Faecium NOT DETECTED NOT DETECTED   Listeria monocytogenes NOT DETECTED NOT DETECTED   Staphylococcus species NOT DETECTED NOT DETECTED   Staphylococcus aureus (BCID) NOT DETECTED NOT DETECTED   Staphylococcus epidermidis NOT DETECTED NOT DETECTED   Staphylococcus lugdunensis NOT DETECTED NOT DETECTED   Streptococcus species NOT DETECTED NOT DETECTED   Streptococcus agalactiae NOT DETECTED NOT DETECTED   Streptococcus pneumoniae NOT DETECTED NOT DETECTED   Streptococcus pyogenes NOT DETECTED NOT DETECTED   A.calcoaceticus-baumannii NOT DETECTED NOT DETECTED   Bacteroides fragilis NOT DETECTED NOT DETECTED   Enterobacterales DETECTED (A) NOT DETECTED   Enterobacter cloacae complex NOT DETECTED NOT DETECTED   Escherichia coli DETECTED (A) NOT DETECTED   Klebsiella aerogenes NOT DETECTED NOT DETECTED   Klebsiella oxytoca NOT DETECTED NOT DETECTED   Klebsiella pneumoniae NOT DETECTED NOT DETECTED   Proteus species NOT DETECTED NOT DETECTED   Salmonella species NOT DETECTED NOT DETECTED   Serratia marcescens NOT DETECTED NOT DETECTED    Haemophilus influenzae NOT DETECTED NOT DETECTED   Neisseria meningitidis NOT DETECTED NOT DETECTED   Pseudomonas aeruginosa NOT DETECTED NOT DETECTED   Stenotrophomonas maltophilia NOT DETECTED NOT DETECTED   Candida albicans NOT DETECTED NOT DETECTED   Candida auris NOT DETECTED NOT DETECTED   Candida glabrata NOT DETECTED NOT DETECTED   Candida krusei NOT DETECTED NOT DETECTED   Candida parapsilosis NOT DETECTED NOT DETECTED   Candida tropicalis NOT DETECTED NOT DETECTED   Cryptococcus neoformans/gattii NOT DETECTED NOT DETECTED   CTX-M ESBL DETECTED (A) NOT DETECTED   Carbapenem resistance IMP NOT DETECTED NOT DETECTED   Carbapenem resistance KPC NOT DETECTED NOT DETECTED   Carbapenem resistance NDM NOT DETECTED NOT DETECTED   Carbapenem resist OXA 48 LIKE NOT DETECTED NOT DETECTED   Carbapenem resistance VIM NOT DETECTED NOT DETECTED    Doreene Eland, PharmD, BCPS, BCIDP Work Cell: [phone] 12/27/2021 11:37 AM

## 2021-12-27 NOTE — ED Notes (Signed)
Kennyth Lose, RN verified rate, dose of infusion and bolus.

## 2021-12-27 NOTE — Progress Notes (Signed)
Pharmacy Antibiotic Note  Wayne Burns is a 66 y.o. male admitted on 12/26/2021 with bacteremia.  Pharmacy has been consulted for meropenem dosing. Recent hospital stay at [facility] (left AMA), unable to care for himself at home.  Too weak to self-cath.  History of urinary retention. At [facility] had bilateral ureteral stents for obstructing stones.    2/21 Blood cx: GNR, BCID ESBL E Coli Renal: SCr 3.82, est CrCl = 20 ml/min  Plan: Stop current antibiotic (ceftriaxone) Meropenem 1gm x 1 then 500mg  IV q12h Follow culture and possibility for OPAT Monitor renal function   Height: 5\' 9"  (175.3 cm) Weight: 72.6 kg (160 lb) IBW/kg (Calculated) : 70.7  Temp (24hrs), Avg:98.2 F (36.8 C), Min:98.2 F (36.8 C), Max:98.2 F (36.8 C)  Recent Labs  Lab 12/26/21 2020 12/27/21 0532  WBC 19.2* 14.4*  CREATININE 3.63* 3.82*  LATICACIDVEN 1.7  --     Estimated Creatinine Clearance: 19.8 mL/min (A) (by C-G formula based on SCr of 3.82 mg/dL (H)).    No Known Allergies  Antimicrobials this admission: Cefepime 2/21 x1 Meropenem 2/22 >>  Dose adjustments this admission:   Microbiology results: 2/21 BCx: GNR, BCID ESBL E Coli 2/22 UCx:     Thank you for allowing pharmacy to be a part of this patients care.  Doreene Eland, PharmD, BCPS, BCIDP Work Cell: [phone] 12/27/2021 12:01 PM

## 2021-12-27 NOTE — Assessment & Plan Note (Deleted)
Wheezing today.  Currently does not smoke.  Started on bronchodilators, steroids

## 2021-12-27 NOTE — ED Notes (Signed)
Report to Karma Ganja, RN

## 2021-12-27 NOTE — Assessment & Plan Note (Addendum)
Patient had right ureteral stent malposition.  Dr. Bernardo Heater did stent repositioning on 01/02/2022. Now stable without issues. Urology follow up after discharge.

## 2021-12-27 NOTE — Consult Note (Signed)
NAMEThien Burns, MRN:  [id number], DOB:  1956-03-08, LOS: 0 ADMISSION DATE:  12/26/2021, CONSULTATION DATE:  12/27/2021 REFERRING MD:  Dr. Tawanna Solo, CHIEF COMPLAINT:  Septic shock   Brief Pt Description / Synopsis:  66 year old male admitted with septic shock due to severe urosepsis and ESBL E.Coli BACTEREMIA from bilateral obstructing distal ureteral calculi, along with new onset atrial fibrillation with RVR, acute kidney injury, and metabolic acidosis.  Cardiology, urology, nephrology following.  History of Present Illness:  Wayne Burns is a 66 year old male with a past medical history significant for COPD, hypertension, chronic urinary retention with bilateral hydronephrosis dating back to 2020 who was brought to [facility] ED on 12/26/2021 due to family concerns that he was not being able to take care of himself with poor living conditions, refusing to go to hospital, and felt to be a danger to himself.  He was IVC by his family members.  Of note he was recently hospitalized at [facility] for 10 days (12/12/21-12/22/21) for treatment of AKI secondary to bilateral obstructing distal ureteral calculi.  Hospital course was complicated by type II coronary artery disease, sepsis, renal failure, respiratory failure, and melena.  Underwent EGD on 2/16 with evidence of prior gastric ulcers but no acute bleeding. Records were reviewed in [facility] with summary as follows:  Admitted 12/12/2021 with AKI secondary to bilateral obstructing distal ureteral calculi.  Creatinine was 10.  CT showed severe right hydronephrosis with a 0.7 cm obstructing stone at the right distal ureter/UVJ. Moderate to severe left hydronephrosis with a 0.8 cm obstructing stone at the left distal ureter/UVJ. Severe bilateral periureteral/perinephric stranding with trace fluid tracking along the bilateral anterior renal fascia.  Bilateral PCN placement initially recommended.  Placement of left PCN was successful however right PCN unable to be placed  secondary to patient agitation.  After PCN placement he was transferred to CCU and was ultimately intubated.  Due to critical condition additional procedures were deferred until there was clinical improvement. Underwent cystoscopy with bilateral ureteral stent placement 12/14/2013.  There was difficulty placing the right ureteral stent secondary to J hooking of the right proximal ureter after initial stent placement there was distal migration.  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  Left ureteral stent was placed without problems and left PCN was removed.  Urology plan was definitive stone treatment within the next 3 months after cardiac clearance After PCN placement ECG demonstrated new ST elevation in the inferior leads.  He was thought to have a type II event related to coronary artery disease, sepsis, renal failure and respiratory failure.  He was to follow-up with cardiology after discharge to discuss complex PCI in the near future During his hospitalization he was also found to have melena and underwent EGD 2/16 with evidence of possible prior gastric ulcers but no evidence of recent bleeding Positive COVID test 12/12/2021 with symptoms starting 11/29/2021 he received 5 days remdesivir and dexamethasone for 10 days starting on 2/8  ED Course: Initial vital signs: Temperature 98.2 F orally, respiratory rate 27, pulse 126, blood pressure 90/54, pulse ox 94% on room air Significant labs: Sodium 131, bicarbonate 18, glucose 156, BUN 65, creatinine 3.63, calcium 8.7, anion gap 13, alkaline phosphatase 204, albumin 2.7, AST 31, ALT 55, total bilirubin 2.1, CK10, high-sensitivity troponin 67, lactic acid 1.7, WBC 19.2, hemoglobin 10.2, hematocrit 32.2, serum acetaminophen less than 10, salicylates less than 7, ethyl alcohol less than 10 COVID-19  PCR positive Urinalysis consistent with UTI Urine drug screen negative Imaging: Chest  x-ray>>Stable heart size allowing for differences in technique. Unchanged mediastinal contours. Aortic atherosclerosis and coronary stent. Mild hyperinflation and bronchial thickening, chronic. No acute airspace disease. No pulmonary edema. No pleural effusion or pneumothorax no acute osseous findings. CT abdomen pelvis without contrast>>IMPRESSION: 1. Bilateral endo ureteral stents, with a malpositioned right-sided endo ureteral stent, as described above. Surgical an urology consultation is recommended. 2. Marked severity diffuse urinary bladder wall thickening with air suspected within the lateral aspect of the bladder wall on the right which may represent sequelae associated with cystitis. The presence of an underlying neoplastic process cannot be excluded. 3. Renal calculi within the right renal pelvis with numerous subcentimeter non-obstructing left renal calculi. 4. Splenomegaly. 5. Aortic atherosclerosis.  He met sepsis criteria therefore he was given IV fluids and broad-spectrum antibiotics.  He was admitted by the hospitalist.  Cardiology, nephrology, and urology were consulted  While boarding in the ED and waiting for bed placement on 2/22, he became hypotensive concerning for developing septic shock.  PCCM was consulted due to potential need for vasopressors.  Pertinent  Medical History  COPD Hypertension  Micro Data:  2/21: SARS-CoV-2 PCR>>Positive 2/21: Influenza PCR>> negative 2/21: Blood culture>> ESBL E. coli 2/21: Urine>>  Antimicrobials:  Cefepime 2/21 x1 dose Ceftriaxone 2/21>>2/22 Meropenem 2/22>>  Significant Hospital Events: Including procedures, antibiotic start and stop dates in addition to other pertinent events   2/21: Admitted by hospitalist.  Cardiology, nephrology, urology consulted. 2/22: Developing septic shock requiring vasopressors, PCCM consulted.  Blood cultures positive for ESBL E. Coli.  Antibiotics changed to meropenem  Interim History /  Subjective:  -Patient was admitted last night by hospitalist for urosepsis due to bilateral obstructing distal ureteral calculi. -Urology, nephrology, cardiology consulted -Blood cultures resulted with ESBL E. coli -This morning with hypotension concerning for developing septic shock ~PCCM consulted due to need for potential vasopressors -Patient is on room air, awake and alert, blood pressure is soft with systolic blood pressures 51O to low 100s at time of evaluation in the ED  Objective   Blood pressure (!) 91/59, pulse (!) 110, temperature 98.2 F (36.8 C), temperature source Oral, resp. rate (!) 23, height 5' 9" (1.753 m), weight 72.6 kg, SpO2 100 %.        Intake/Output Summary (Last 24 hours) at 12/27/2021 1315 Last data filed at 12/26/2021 2338 Gross per 24 hour  Intake 999 ml  Output 1800 ml  Net -801 ml   Filed Weights   12/26/21 2013  Weight: 72.6 kg    Examination: General: Acutely ill-appearing male, sitting in bed, on room air, no acute distress HENT: Atraumatic, normocephalic, neck supple, no JVD Lungs: Clear breath sounds bilaterally, even, nonlabored Cardiovascular:  Abdomen: Tachycardia, irregular irregular rhythm, no murmurs, rubs, gallop Extremities: Normal bulk and tone, previous surgery to right shin and foot, trace edema bilateral Neuro: Awake and alert, oriented to person and place, follows commands, no focal deficits, speech clear, pupils PERRLA GU: Foley catheter in place  Resolved Hospital Problem list     Assessment & Plan:   Septic Shock New onset Atrial fibrillation w/ RVR Mildly elevated Troponin, suspect demand ischemia PMHx: Hypertension, CAD -Continuous cardiac monitoring -Maintain MAP >65 -IV fluids -Vasopressors as needed to maintain MAP goal -Start Midodrine -Trend lactic acid until normalized (1.7 ~ 1.1) -HS Troponin peaked at 52 -Cardiology consulted, appreciate input -Continue Amiodarone as per Cardiology -Will defer  anticoagulation to cardiology, CHA2DS2-VASc  is 1 -Echocardiogram pending -Hold home amlodipine  Severe Urosepsis & ESBL E. coli BACTEREMIA due to bilateral obstructing distal ureteral calculi (Right ureteral stent displacement) COVID-19 infection (asymptomatic) -Monitor fever curve -Trend WBC's & Procalcitonin -Follow cultures as above -Continue empiric Meropenem pending cultures & sensitivities -Urology is following, appreciate input ~stents placed at [facility] previously, noted to have proximal migration of the right ureteral stent (remains well above the distal ureter) without significant hydronephrosis on CT.  Urology recommends conservative management for now  Acute kidney injury Mild Hyponatremia Anion Gap Metabolic Acidosis -Monitor I&O's / urinary output -Follow BMP -Ensure adequate renal perfusion -Avoid nephrotoxic agents as able -Replace electrolytes as indicated -IV fluids -Nephrology consulted, appreciate input -Start bicarb drip  Acute Metabolic Encephalopathy in setting of severe sepsis and multiple metabolic derangements -Treat metabolic derangements and infection as above -Provide supportive care -Encourage normal sleep/wake cycle -Avoid sedating medications as able -Pt is IVC  Normocytic Normochromic Anemia without overt s/sx of blood loss PMHx: Recent Melena at [facility] with EGD revealing gastric ulcers with no evidence acute bleeding -Monitor for S/Sx of bleeding -Trend CBC -Heparin SQ for VTE Prophylaxis  -Transfuse for Hgb <7 -PPI BID  COPD without acute exacerbation COVID-19 infection (asymptomatic) -Supplemental O2 as needed to maintain O2 sats 88 to 92% -Follow intermittent chest x-ray and ABG as needed -Bronchodilators -IV steroids (80 mg Solu-Medrol daily) -Continue Spiriva  Mildly elevated LFT's -Follow LFT's -CT Abdomen/Pelvis on admission: No focal liver abnormality is seen. No gallstones, gallbladder wall thickening, or biliary  dilatation.  Hyperglycemia -CBG's q4h; Target range of 140 to 180 -SSI -Follow ICU Hypo/Hyperglycemia protocol -Check Hgb A1c    Best Practice (right click and "Reselect all SmartList Selections" daily)   Diet/type: Regular consistency (see orders) DVT prophylaxis: prophylactic heparin  GI prophylaxis: PPI Lines: N/A Foley:  Yes, and it is still needed Code Status:  full code Last date of multidisciplinary goals of care discussion [N/A]  Labs   CBC: Recent Labs  Lab 12/26/21 2020 12/27/21 0532  WBC 19.2* 14.4*  HGB 10.2* 8.3*  HCT 32.2* 25.6*  MCV 89.2 89.2  PLT 207 419    Basic Metabolic Panel: Recent Labs  Lab 12/26/21 2020 12/27/21 0532  NA 131* 132*  K 4.0 4.2  CL 100 105  CO2 18* 17*  GLUCOSE 156* 138*  BUN 65* 64*  CREATININE 3.63* 3.82*  CALCIUM 8.7* 8.0*   GFR: Estimated Creatinine Clearance: 19.8 mL/min (A) (by C-G formula based on SCr of 3.82 mg/dL (H)). Recent Labs  Lab 12/26/21 2020 12/27/21 0532  WBC 19.2* 14.4*  LATICACIDVEN 1.7  --     Liver Function Tests: Recent Labs  Lab 12/26/21 2020 12/27/21 0532  AST 31 28  ALT 55* 44  ALKPHOS 204* 167*  BILITOT 2.1* 1.5*  PROT 8.1 6.8  ALBUMIN 2.7* 2.2*   No results for input(s): LIPASE, AMYLASE in the last 168 hours. No results for input(s): AMMONIA in the last 168 hours.  ABG No results found for: PHART, PCO2ART, PO2ART, HCO3, TCO2, ACIDBASEDEF, O2SAT   Coagulation Profile: No results for input(s): INR, PROTIME in the last 168 hours.  Cardiac Enzymes: Recent Labs  Lab 12/26/21 2020  CKTOTAL 10*    HbA1C: No results found for: HGBA1C  CBG: No results for input(s): GLUCAP in the last 168 hours.  Review of Systems:   Positives in BOLD: Gen: Denies fever, chills, weight change, fatigue/malaise, night sweats HEENT: Denies blurred vision, double vision, hearing loss, tinnitus, sinus  congestion, rhinorrhea, sore throat, neck stiffness, dysphagia PULM: Denies shortness of  breath, cough, sputum production, hemoptysis, wheezing CV: Denies chest pain, edema, orthopnea, paroxysmal nocturnal dyspnea, palpitations GI: Denies abdominal pain, nausea, vomiting, diarrhea, hematochezia, melena, constipation, change in bowel habits GU: Denies dysuria, hematuria, polyuria, oliguria, urethral discharge Endocrine: Denies hot or cold intolerance, polyuria, polyphagia or appetite change Derm: Denies rash, dry skin, scaling or peeling skin change Heme: Denies easy bruising, bleeding, bleeding gums Neuro: Denies headache, numbness, weakness, slurred speech, loss of memory or consciousness   Past Medical History:  He,  has a past medical history of COPD (chronic obstructive pulmonary disease) ([place]) and Hypertension.   Surgical History:   Past Surgical History:  Procedure Laterality Date   ANKLE SURGERY       Social History:   reports that he has quit smoking. His smoking use included cigarettes. He smoked an average of .5 packs per day. He has never used smokeless tobacco. He reports that he does not currently use alcohol. He reports that he does not currently use drugs.   Family History:  His family history is not on file.   Allergies No Known Allergies   Home Medications  Prior to Admission medications   Medication Sig Start Date End Date Taking? Authorizing Provider  amLODipine (NORVASC) 10 MG tablet Take 10 mg by mouth daily. 04/27/19 10/24/19  [provider]  atorvastatin (LIPITOR) 80 MG tablet Take 80 mg by mouth daily. 12/23/21   [provider]  cefdinir (OMNICEF) 300 MG capsule Take 300 mg by mouth once. 12/23/21   [provider]  pantoprazole (PROTONIX) 40 MG tablet Take 40 mg by mouth 2 (two) times daily. 12/23/21   [provider]  sertraline (ZOLOFT) 100 MG tablet Take 100 mg by mouth 2 (two) times daily. 08/26/21   [provider]  sertraline (ZOLOFT) 50 MG tablet Take 50 mg by mouth daily. 04/27/19 07/29/19   [provider]  tamsulosin (FLOMAX) 0.4 MG CAPS capsule Take 0.4 mg by mouth daily. 05/05/19   [provider]  tiotropium (SPIRIVA HANDIHALER) 18 MCG inhalation capsule Place 1 capsule into inhaler and inhale daily. 05/15/19 11/11/19  [provider]  traZODone (DESYREL) 50 MG tablet Take 100 mg by mouth at bedtime. 05/15/19 08/13/19  [provider]     Critical care time: 60 minutes     Darel Hong, AGACNP-BC [facility] [place] epic messenger for cross cover needs If after hours, please call E-link

## 2021-12-27 NOTE — ED Notes (Signed)
Elana RN aware of assigned bed

## 2021-12-27 NOTE — Consult Note (Signed)
[facility]  CONSULT NOTE    Date: 12/27/2021                  Patient Name:  Wayne Burns  MRN: [id number]  DOB: 02-01-1956  Age / Sex: 66 y.o., male         PCP: Jerene Pitch, MD                 Service Requesting Consult: [facility]                 Reason for Consult: Acute kidney injury            History of Present Illness: Wayne Burns is a 66 y.o.  male with past medical conditions including COPD and hypertension, who was admitted to [facility] on 12/26/2021 for Sepsis [facility]) [A41.9] Septic shock ([place]) [A41.9, R65.21]  Patient seen sitting in recliner.  States he was brought in by EMS after his family members called on 4/and forced him to come.  Patient states he has been progressively becoming weak since his discharge from [facility].  Chart review reveals patient left [facility] AMA on Friday with kidney stones, COVID and blocked heart arteries.  Patient's family felt patient was a danger to himself with lack of proper health treatment and IVC papers were obtained and patient presented to the hospital.  Patient states he has progressively gotten weaker over the past few weeks, prior to the admission to [facility].  Does report poor appetite but denies nausea and vomiting.  Denies current shortness of breath and cough.  Denies chest pain or discomfort.  Attributed lack of urine output due to poor appetite and recent ureter stent placement.  He does report increased pain and discomfort in his lower abdomen since leaving hospital on Friday.  Labs include sodium 130 bicarb 17, glucose 138, creatinine 3.18 with GFR 17, albumin 2.2, white blood cells 14.4, hemoglobin 8.3.  Baseline creatinine appears to be 2.8 with GFR 25 on 12/22/2021.  Respiratory panel negative but positive for COVID-19.  1 set of blood cultures positive for gram-negative rods.  CT abdomen pelvis showed bilateral ureteral stent with right-sided stent mispositioned.  Urine culture pending.  Chest x-ray negative for acute  changes   Medications: Outpatient medications: (Not in a hospital admission)   Current medications: Current Facility-Administered Medications  Medication Dose Route Frequency Provider Last Rate Last Admin   0.9 %  sodium chloride infusion  250 mL Intravenous PRN Para Skeans, MD       0.9 %  sodium chloride infusion   Intravenous Continuous Colon Flattery, NP 100 mL/hr at 12/27/21 1245 New Bag at 12/27/21 1245   0.9 %  sodium chloride infusion  250 mL Intravenous Continuous Darel Hong D, NP       acetaminophen (TYLENOL) tablet 650 mg  650 mg Oral Q6H PRN Para Skeans, MD   650 mg at 12/27/21 0109   Or   acetaminophen (TYLENOL) suppository 650 mg  650 mg Rectal Q6H PRN Para Skeans, MD       amiodarone (NEXTERONE PREMIX) 360-4.14 MG/200ML-% (1.8 mg/mL) IV infusion  60 mg/hr Intravenous Continuous Tristan Schroeder, PA-C 33.3 mL/hr at 12/27/21 1233 60 mg/hr at 12/27/21 1233   Followed by   amiodarone (NEXTERONE PREMIX) 360-4.14 MG/200ML-% (1.8 mg/mL) IV infusion  30 mg/hr Intravenous Continuous Tang, Alanson Puls, PA-C       heparin injection 5,000 Units  5,000 Units Subcutaneous Q12H Para Skeans, MD  5,000 Units at 12/27/21 1520   levalbuterol (XOPENEX HFA) inhaler 2 puff  2 puff Inhalation Q8H Adhikari, Amrit, MD       meropenem (MERREM) 500 mg in sodium chloride 0.9 % 100 mL IVPB  500 mg Intravenous Q12H Zeigler, Dustin G, RPH       methylPREDNISolone sodium succinate (SOLU-MEDROL) 125 mg/2 mL injection 80 mg  80 mg Intravenous Q24H Shelly Coss, MD   80 mg at 12/27/21 1109   midodrine (PROAMATINE) tablet 10 mg  10 mg Oral TID WC Shelly Coss, MD   10 mg at 12/27/21 1229   ondansetron (ZOFRAN) tablet 4 mg  4 mg Oral Q6H PRN Para Skeans, MD       Or   ondansetron (ZOFRAN) injection 4 mg  4 mg Intravenous Q6H PRN Para Skeans, MD       pantoprazole (PROTONIX) injection 40 mg  40 mg Intravenous Q12H Florina Ou V, MD   40 mg at 12/27/21 1109   phenylephrine  (NEO-SYNEPHRINE) 20mg /NS 233mL premix infusion  25-200 mcg/min Intravenous Titrated Darel Hong D, NP 33.8 mL/hr at 12/27/21 1411 45 mcg/min at 12/27/21 1411   sodium bicarbonate 150 mEq in dextrose 5 % 1,150 mL infusion   Intravenous Continuous Flora Lipps, MD       sodium chloride flush (NS) 0.9 % injection 3 mL  3 mL Intravenous Q12H Florina Ou V, MD   3 mL at 12/27/21 0932   sodium chloride flush (NS) 0.9 % injection 3 mL  3 mL Intravenous Q12H Florina Ou V, MD   3 mL at 12/27/21 0932   sodium chloride flush (NS) 0.9 % injection 3 mL  3 mL Intravenous PRN Para Skeans, MD       tamsulosin [facility]) capsule 0.4 mg  0.4 mg Oral Daily Florina Ou V, MD   0.4 mg at 12/27/21 1109   [START ON 12/28/2021] tiotropium (SPIRIVA) inhalation capsule ([facility] use ONLY) 18 mcg  1 capsule Inhalation Daily Para Skeans, MD       Current Outpatient Medications  Medication Sig Dispense Refill   amLODipine (NORVASC) 10 MG tablet Take 10 mg by mouth daily.     atorvastatin (LIPITOR) 80 MG tablet Take 80 mg by mouth daily.     cefdinir (OMNICEF) 300 MG capsule Take 300 mg by mouth once.     pantoprazole (PROTONIX) 40 MG tablet Take 40 mg by mouth 2 (two) times daily.     sertraline (ZOLOFT) 100 MG tablet Take 100 mg by mouth 2 (two) times daily.     sertraline (ZOLOFT) 50 MG tablet Take 50 mg by mouth daily.     tamsulosin (FLOMAX) 0.4 MG CAPS capsule Take 0.4 mg by mouth daily.     tiotropium (SPIRIVA HANDIHALER) 18 MCG inhalation capsule Place 1 capsule into inhaler and inhale daily.     traZODone (DESYREL) 50 MG tablet Take 100 mg by mouth at bedtime.        Allergies: No Known Allergies    Past Medical History: Past Medical History:  Diagnosis Date   COPD (chronic obstructive pulmonary disease) ([place])    Hypertension      Past Surgical History: Past Surgical History:  Procedure Laterality Date   ANKLE SURGERY       Family History: History reviewed. No pertinent family  history.   Social History: Social History   Socioeconomic History   Marital status: Single    Spouse name: Not on file   Number  of children: Not on file   Years of education: Not on file   Highest education level: Not on file  Occupational History   Not on file  Tobacco Use   Smoking status: Former    Packs/day: 0.50    Types: Cigarettes   Smokeless tobacco: Never  Vaping Use   Vaping Use: Never used  Substance and Sexual Activity   Alcohol use: Not Currently    Comment: often   Drug use: Not Currently   Sexual activity: Not on file  Other Topics Concern   Not on file  Social History Narrative   Not on file   Social Determinants of Health   Financial Resource Strain: Not on file  Food Insecurity: Not on file  Transportation Needs: Not on file  Physical Activity: Not on file  Stress: Not on file  Social Connections: Not on file  Intimate Partner Violence: Not on file     Review of Systems: Review of Systems  Constitutional:  Negative for chills, fever and malaise/fatigue.  HENT:  Negative for congestion, sore throat and tinnitus.   Eyes:  Negative for blurred vision and redness.  Respiratory:  Negative for cough, shortness of breath and wheezing.   Cardiovascular:  Negative for chest pain, palpitations, claudication and leg swelling.  Gastrointestinal:  Positive for abdominal pain. Negative for blood in stool, diarrhea, nausea and vomiting.  Genitourinary:  Positive for dysuria. Negative for flank pain, frequency and hematuria.  Musculoskeletal:  Negative for back pain, falls and myalgias.  Skin:  Negative for rash.  Neurological:  Positive for weakness. Negative for dizziness and headaches.  Endo/Heme/Allergies:  Does not bruise/bleed easily.  Psychiatric/Behavioral:  Negative for depression. The patient is not nervous/anxious and does not have insomnia.    Vital Signs: Blood pressure (!) 84/59, pulse (!) 37, temperature 98.2 F (36.8 C), temperature source  Oral, resp. rate (!) 21, height 5\' 9"  (1.753 m), weight 72.6 kg, SpO2 91 %.  Weight trends: Danley Danker Weights   12/26/21 2013  Weight: 72.6 kg    Physical Exam: General: NAD  Head: Normocephalic, atraumatic. Moist oral mucosal membranes  Eyes: Anicteric  Lungs:  Wheezing throughout, normal effort  Heart: Regular rate and rhythm  Abdomen:  Soft, nontender, obese  Extremities: No peripheral edema.  Neurologic: Nonfocal, moving all four extremities  Skin: No lesions  GU Foley catheter     Lab results: Basic Metabolic Panel: Recent Labs  Lab 12/26/21 2020 12/27/21 0532  NA 131* 132*  K 4.0 4.2  CL 100 105  CO2 18* 17*  GLUCOSE 156* 138*  BUN 65* 64*  CREATININE 3.63* 3.82*  CALCIUM 8.7* 8.0*    Liver Function Tests: Recent Labs  Lab 12/26/21 2020 12/27/21 0532  AST 31 28  ALT 55* 44  ALKPHOS 204* 167*  BILITOT 2.1* 1.5*  PROT 8.1 6.8  ALBUMIN 2.7* 2.2*   No results for input(s): LIPASE, AMYLASE in the last 168 hours. No results for input(s): AMMONIA in the last 168 hours.  CBC: Recent Labs  Lab 12/26/21 2020 12/27/21 0532  WBC 19.2* 14.4*  HGB 10.2* 8.3*  HCT 32.2* 25.6*  MCV 89.2 89.2  PLT 207 156    Cardiac Enzymes: Recent Labs  Lab 12/26/21 2020  CKTOTAL 10*    BNP: Invalid input(s): POCBNP  CBG: No results for input(s): GLUCAP in the last 168 hours.  Microbiology: Results for orders placed or performed during the hospital encounter of 12/26/21  Resp Panel by RT-PCR (Flu A&B,  Covid)     Status: Abnormal   Collection Time: 12/26/21 10:33 PM   Specimen: Nasopharyngeal(NP) swabs in vial transport medium  Result Value Ref Range Status   SARS Coronavirus 2 by RT PCR POSITIVE (A) NEGATIVE Final    Comment: (NOTE) SARS-CoV-2 target nucleic acids are DETECTED.  The SARS-CoV-2 RNA is generally detectable in upper respiratory specimens during the acute phase of infection. Positive results are indicative of the presence of the identified  virus, but do not rule out bacterial infection or co-infection with other pathogens not detected by the test. Clinical correlation with patient history and other diagnostic information is necessary to determine patient infection status. The expected result is Negative.  Fact Sheet for Patients: EntrepreneurPulse.com.au  Fact Sheet for Healthcare Providers: IncredibleEmployment.be  This test is not yet approved or cleared by the [place] FDA and  has been authorized for detection and/or diagnosis of SARS-CoV-2 by FDA under an Emergency Use Authorization (EUA).  This EUA will remain in effect (meaning this test can be used) for the duration of  the COVID-19 declaration under Section 564(b)(1) of the A ct, 21 U.S.C. section 360bbb-3(b)(1), unless the authorization is terminated or revoked sooner.     Influenza A by PCR NEGATIVE NEGATIVE Final   Influenza B by PCR NEGATIVE NEGATIVE Final    Comment: (NOTE) The Xpert Xpress SARS-CoV-2/FLU/RSV plus assay is intended as an aid in the diagnosis of influenza from Nasopharyngeal swab specimens and should not be used as a sole basis for treatment. Nasal washings and aspirates are unacceptable for Xpert Xpress SARS-CoV-2/FLU/RSV testing.  Fact Sheet for Patients: EntrepreneurPulse.com.au  Fact Sheet for Healthcare Providers: IncredibleEmployment.be  This test is not yet approved or cleared by the [place] FDA and has been authorized for detection and/or diagnosis of SARS-CoV-2 by FDA under an Emergency Use Authorization (EUA). This EUA will remain in effect (meaning this test can be used) for the duration of the COVID-19 declaration under Section 564(b)(1) of the Act, 21 U.S.C. section 360bbb-3(b)(1), unless the authorization is terminated or revoked.  Performed at [facility], [address] [place], [place] [address]   Blood culture  (routine x 2)     Status: None (Preliminary result)   Collection Time: 12/26/21 10:33 PM   Specimen: BLOOD  Result Value Ref Range Status   Specimen Description BLOOD LEFT ASSIST CONTROL  Final   Special Requests   Final    BOTTLES DRAWN AEROBIC AND ANAEROBIC Blood Culture adequate volume   Culture   Final    NO GROWTH < 12 HOURS Performed at [facility], [address], [place], [place] [address]    Report Status PENDING  Incomplete  Blood culture (routine x 2)     Status: None (Preliminary result)   Collection Time: 12/26/21 10:46 PM   Specimen: BLOOD  Result Value Ref Range Status   Specimen Description BLOOD RIGHT ASSIST CONTROL  Final   Special Requests   Final    BOTTLES DRAWN AEROBIC AND ANAEROBIC Blood Culture adequate volume   Culture  Setup Time   Final    GRAM NEGATIVE RODS ANAEROBIC BOTTLE ONLY Organism ID to follow CRITICAL RESULT CALLED TO, READ BACK BY AND VERIFIED WITHLu Duffel PHARMD 1126 12/27/21 HNM Performed at [facility], [address], [place], [place] [address]    Culture GRAM NEGATIVE RODS  Final   Report Status PENDING  Incomplete  Blood Culture ID Panel (Reflexed)     Status: Abnormal  Collection Time: 12/26/21 10:46 PM  Result Value Ref Range Status   Enterococcus faecalis NOT DETECTED NOT DETECTED Final   Enterococcus Faecium NOT DETECTED NOT DETECTED Final   Listeria monocytogenes NOT DETECTED NOT DETECTED Final   Staphylococcus species NOT DETECTED NOT DETECTED Final   Staphylococcus aureus (BCID) NOT DETECTED NOT DETECTED Final   Staphylococcus epidermidis NOT DETECTED NOT DETECTED Final   Staphylococcus lugdunensis NOT DETECTED NOT DETECTED Final   Streptococcus species NOT DETECTED NOT DETECTED Final   Streptococcus agalactiae NOT DETECTED NOT DETECTED Final   Streptococcus pneumoniae NOT DETECTED NOT DETECTED Final   Streptococcus pyogenes NOT DETECTED NOT DETECTED Final   A.calcoaceticus-baumannii NOT DETECTED  NOT DETECTED Final   Bacteroides fragilis NOT DETECTED NOT DETECTED Final   Enterobacterales DETECTED (A) NOT DETECTED Final    Comment: Enterobacterales represent a large order of gram negative bacteria, not a single organism. CRITICAL RESULT CALLED TO, READ BACK BY AND VERIFIED WITH: DEVAN MITCHELL PHARMD 1126 12/27/21 HNM    Enterobacter cloacae complex NOT DETECTED NOT DETECTED Final   Escherichia coli DETECTED (A) NOT DETECTED Final    Comment: CRITICAL RESULT CALLED TO, READ BACK BY AND VERIFIED WITH: DEVAN MITCHELL ZWCHEN [id number] 12/27/21 HNM    Klebsiella aerogenes NOT DETECTED NOT DETECTED Final   Klebsiella oxytoca NOT DETECTED NOT DETECTED Final   Klebsiella pneumoniae NOT DETECTED NOT DETECTED Final   Proteus species NOT DETECTED NOT DETECTED Final   Salmonella species NOT DETECTED NOT DETECTED Final   Serratia marcescens NOT DETECTED NOT DETECTED Final   Haemophilus influenzae NOT DETECTED NOT DETECTED Final   Neisseria meningitidis NOT DETECTED NOT DETECTED Final   Pseudomonas aeruginosa NOT DETECTED NOT DETECTED Final   Stenotrophomonas maltophilia NOT DETECTED NOT DETECTED Final   Candida albicans NOT DETECTED NOT DETECTED Final   Candida auris NOT DETECTED NOT DETECTED Final   Candida glabrata NOT DETECTED NOT DETECTED Final   Candida krusei NOT DETECTED NOT DETECTED Final   Candida parapsilosis NOT DETECTED NOT DETECTED Final   Candida tropicalis NOT DETECTED NOT DETECTED Final   Cryptococcus neoformans/gattii NOT DETECTED NOT DETECTED Final   CTX-M ESBL DETECTED (A) NOT DETECTED Final    Comment: CRITICAL RESULT CALLED TO, READ BACK BY AND VERIFIED WITH: DEVAN MITCHELL PHARMD 1126 12/27/21 HNM (NOTE) Extended spectrum beta-lactamase detected. Recommend a carbapenem as initial therapy.      Carbapenem resistance IMP NOT DETECTED NOT DETECTED Final   Carbapenem resistance KPC NOT DETECTED NOT DETECTED Final   Carbapenem resistance NDM NOT DETECTED NOT DETECTED Final    Carbapenem resist OXA 48 LIKE NOT DETECTED NOT DETECTED Final   Carbapenem resistance VIM NOT DETECTED NOT DETECTED Final    Comment: Performed at [facility], [address] [place], [place] [address]    Coagulation Studies: No results for input(s): LABPROT, INR in the last 72 hours.  Urinalysis: Recent Labs    12/26/21 2020  COLORURINE AMBER*  LABSPEC 1.010  PHURINE 6.0  GLUCOSEU NEGATIVE  HGBUR MODERATE*  BILIRUBINUR NEGATIVE  KETONESUR NEGATIVE  PROTEINUR 100*  NITRITE NEGATIVE  LEUKOCYTESUR LARGE*      Imaging: CT ABDOMEN PELVIS WO CONTRAST  Addendum Date: 12/26/2021   ADDENDUM REPORT: 12/26/2021 23:44 ADDENDUM: It should be noted that the air seen within the wall of the urinary bladder may represent sequelae associated with the previously described malpositioned right-sided endo ureteral stent. Electronically Signed   By: Virgina Norfolk M.D.   On: 12/26/2021 23:44   Result Date: 12/26/2021  CLINICAL DATA:  Patient with altered mentation and recent history of COVID positive. EXAM: CT ABDOMEN AND PELVIS WITHOUT CONTRAST TECHNIQUE: Multidetector CT imaging of the abdomen and pelvis was performed following the standard protocol without IV contrast. RADIATION DOSE REDUCTION: This exam was performed according to the departmental dose-optimization program which includes automated exposure control, adjustment of the mA and/or kV according to patient size and/or use of iterative reconstruction technique. COMPARISON:  July 14, 2019 FINDINGS: Lower chest: No acute abnormality. Hepatobiliary: No focal liver abnormality is seen. No gallstones, gallbladder wall thickening, or biliary dilatation. Pancreas: Unremarkable. No pancreatic ductal dilatation or surrounding inflammatory changes. Spleen: There is mild to moderate severity splenomegaly. Adrenals/Urinary Tract: Adrenal glands are unremarkable. Kidneys are normal in size, without focal lesions. Bilateral endo ureteral  stents are noted. The proximal portion of the right-sided endo ureteral stent is seen inferior to the right renal pelvis (coronal reformatted images 46 through 50, CT series 5), while the left-sided endo ureteral stent is properly positioned. Ill-defined subcentimeter renal calculi are seen within the right renal pelvis which is mildly dilated. Numerous subcentimeter non-obstructing renal calculi are seen within the left kidney. There is marked severity diffuse urinary bladder wall thickening with air suspected within the lateral aspect of the bladder wall on the right. A Foley catheter is also present within the bladder lumen. Stomach/Bowel: Stomach is within normal limits. Appendix appears normal. No evidence of bowel wall thickening, distention, or inflammatory changes. Vascular/Lymphatic: Aortic atherosclerosis. No enlarged abdominal or pelvic lymph nodes. Reproductive: The prostate gland is remarkable. Other: No abdominal wall hernia or abnormality. No abdominopelvic ascites. Musculoskeletal: Multilevel degenerative changes seen throughout the lumbar spine. IMPRESSION: 1. Bilateral endo ureteral stents, with a malpositioned right-sided endo ureteral stent, as described above. Surgical an urology consultation is recommended. 2. Marked severity diffuse urinary bladder wall thickening with air suspected within the lateral aspect of the bladder wall on the right which may represent sequelae associated with cystitis. The presence of an underlying neoplastic process cannot be excluded. 3. Renal calculi within the right renal pelvis with numerous subcentimeter non-obstructing left renal calculi. 4. Splenomegaly. 5. Aortic atherosclerosis. Aortic Atherosclerosis (ICD10-I70.0). Electronically Signed: By: Virgina Norfolk M.D. On: 12/26/2021 23:40   DG Chest Portable 1 View  Result Date: 12/26/2021 CLINICAL DATA:  New atrial fibrillation. EXAM: PORTABLE CHEST 1 VIEW COMPARISON:  Chest radiograph 10/14/2019 FINDINGS:  Stable heart size allowing for differences in technique. Unchanged mediastinal contours. Aortic atherosclerosis and coronary stent. Mild hyperinflation and bronchial thickening, chronic. No acute airspace disease. No pulmonary edema. No pleural effusion or pneumothorax no acute osseous findings. IMPRESSION: 1. No acute chest findings. 2. Mild chronic hyperinflation and bronchial thickening. 3. Aortic atherosclerosis and coronary stent. Electronically Signed   By: Keith Rake M.D.   On: 12/26/2021 20:46     Assessment & Plan: Mr. Obadiah Dennard is a 66 y.o.  male with past medical conditions including COPD and hypertension , who was admitted to [facility] on 12/26/2021 for Sepsis [facility]) [A41.9] Septic shock ([place]) [A41.9, R65.21]  Acute kidney injury with chronic kidney disease stage IV.  Baseline appears to be 2.8 with GFR 25 on 12/22/2021.  AKI may be due to possible obstruction from migrating stent recently placed at [facility].  Will order normal saline at 100 mL/h for hydration after 250 mL bolus..  Recommend urology consult to evaluate stent placement and any intervention.  2.  Hypotension.  Currently on amiodarone drip with midodrine schedule III times daily.  Home regimen of  amlodipine currently held     LOS: 0 [place] 2/22/20234:13 PM

## 2021-12-27 NOTE — Assessment & Plan Note (Addendum)
Continue oral amiodarone.   Cardiology holding off on anticoagulation with the patient's anemia. --Outpatient cardiology follow-up

## 2021-12-27 NOTE — Assessment & Plan Note (Deleted)
Patient on involuntary commitment by his family because he was not taking care of himself, living in poor conditions.  Social worker will be consulted

## 2021-12-27 NOTE — ED Notes (Addendum)
Pt resting comfortably at this time. Normal rise and fall of chest. Pt denies any needs at this time. Pt reminded of PO status for procedure. Call bell in reach.   Night shift RN states pt has been tachycardiac with a-fib RVR and states providers aware and no interventions at this time due to upcoming procedure.

## 2021-12-27 NOTE — Hospital Course (Addendum)
66 year old man with COPD and hypertension.  Brought in initially by family under IVC after being found in very poor living conditions and not taking care of himself.    The patient was found to be in septic shock with ESBL E. coli with acute cystitis and also Candida glabrata fungemia.  Patient was seen by infectious disease and will be on IV Invanz and IV Eraxis through 01/14/2022.  Unfortunately will have to stay the whole course of antibiotics here in the hospital because home health unavailable to be set up.  Patient also has acute kidney injury on superimposed on chronic kidney disease.  The patient had displacement of right ureteral stent that was initially done at [facility].  Dr. Bernardo Heater replaced the stent on 01/02/2022 and will have to follow-up with him for stent removal.  Of note, patient recently admitted at [facility], discharged after a complicated 86-YKZ admission on 12/22/2021.  For summary of that admission, see Care Everywhere for d/c summary and or Urology Consult Note of 12/27/2021.  Patient also had A-fib with RVR, initially required IV amiodarone, now on oral amiodarone.  The patient did have COVID-19 virus infection diagnosed at [facility] on 12/12/2021, treated with remdesivir and steroids.

## 2021-12-27 NOTE — Consult Note (Signed)
Urology Consult  Requesting physician: Florina Ou, MD  Reason for consultation: Ureteral stent displacement   History of Present Illness: Lynda Capistran is a 66 y.o. transported to the [facility] ED by EMS last night.  IVC by family members for not being able to take care of himself and poor living condition and he was felt to be a danger to himself.  Recent hospitalization at [facility].  Although the ED records indicate he left AMA last Friday he was discharged after a 10-day hospitalization.  Records were reviewed in [facility] with summary as follows:  Admitted 12/12/2021 with AKI secondary to bilateral obstructing distal ureteral calculi.  Creatinine was 10.  CT showed severe right hydronephrosis with a 0.7 cm obstructing stone at the right distal ureter/UVJ. Moderate to severe left hydronephrosis with a 0.8 cm obstructing stone at the left distal ureter/UVJ. Severe bilateral periureteral/perinephric stranding with trace fluid tracking along the bilateral anterior renal fascia.  Bilateral PCN placement initially recommended.  Placement of left PCN was successful however right PCN unable to be placed secondary to patient agitation.  After PCN placement he was transferred to CCU and was ultimately intubated.  Due to critical condition additional procedures were deferred until there was clinical improvement. Underwent cystoscopy with bilateral ureteral stent placement 12/14/2013.  There was difficulty placing the right ureteral stent secondary to J hooking of the right proximal ureter after initial stent placement there was distal migration.  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  Left ureteral stent was placed without problems and left PCN was removed.  Urology plan was definitive stone treatment within the next 3 months after cardiac clearance After PCN placement ECG demonstrated new ST elevation in the inferior leads.  He was thought to have a  type II event related to coronary artery disease, sepsis, renal failure and respiratory failure.  He was to follow-up with cardiology after discharge to discuss complex PCI in the near future During his hospitalization he was also found to have melena and underwent EGD 2/16 with evidence of possible prior gastric ulcers but no evidence of recent bleeding Positive COVID test 12/12/2021 with symptoms starting 11/29/2021 he received 5 days remdesivir and dexamethasone for 10 days starting on 2/8  Foley catheter was placed in the ED which was remarkable for grossly purulent urine.  He has a history of chronic urinary retention with bilateral hydronephrosis dating back to 2020 and has been on CIC which she had not performed for the last 3-4 days.  Creatinine on admission 3.63.  It was 2.8 at the time of discharge 12/22/2021  He presently has no complaints.  He denies flank, abdominal or pelvic pain.  He appears to be more awake and alert this morning compared with review of his admission records.   Past Medical History:  Diagnosis Date   COPD (chronic obstructive pulmonary disease) ([place])    Hypertension     Past Surgical History:  Procedure Laterality Date   ANKLE SURGERY      Home Medications:  No outpatient medications have been marked as taking for the 12/26/21 encounter [facility] Encounter).    Allergies: No Known Allergies  History reviewed. No pertinent family history.  Social History:  reports that he has quit smoking. His smoking use included cigarettes. He smoked an average of .5 packs per day. He has never used smokeless tobacco. He reports that he does not currently use alcohol. He reports that he  does not currently use drugs.  ROS: A complete review of systems was performed.  All systems are negative except for pertinent findings as noted.  Physical Exam:  Vital signs in last 24 hours: Temp:  [98.2 F (36.8 C)] 98.2 F (36.8 C) (02/21 2011) Pulse Rate:  [108-126] 113 (02/22  0645) Resp:  [20-27] 20 (02/22 0645) BP: (90-124)/(54-91) 107/64 (02/22 0645) SpO2:  [93 %-98 %] 96 % (02/22 0645) Weight:  [72.6 kg] 72.6 kg (02/21 2013) Constitutional:  Alert, No acute distress HEENT: [place] AT, moist mucus membranes.  Trachea midline, no masses GI: Abdomen is soft, nontender, nondistended, no abdominal masses GU: No CVA tenderness.  Foley catheter draining clear urine   Laboratory Data:  Recent Labs    12/26/21 2020 12/27/21 0532  WBC 19.2* 14.4*  HGB 10.2* 8.3*  HCT 32.2* 25.6*   Recent Labs    12/26/21 2020 12/27/21 0532  NA 131* 132*  K 4.0 4.2  CL 100 105  CO2 18* 17*  GLUCOSE 156* 138*  BUN 65* 64*  CREATININE 3.63* 3.82*  CALCIUM 8.7* 8.0*   No results for input(s): LABPT, INR in the last 72 hours. No results for input(s): LABURIN in the last 72 hours. Results for orders placed or performed during the hospital encounter of 12/26/21  Resp Panel by RT-PCR (Flu A&B, Covid)     Status: Abnormal   Collection Time: 12/26/21 10:33 PM   Specimen: Nasopharyngeal(NP) swabs in vial transport medium  Result Value Ref Range Status   SARS Coronavirus 2 by RT PCR POSITIVE (A) NEGATIVE Final    Comment: (NOTE) SARS-CoV-2 target nucleic acids are DETECTED.  The SARS-CoV-2 RNA is generally detectable in upper respiratory specimens during the acute phase of infection. Positive results are indicative of the presence of the identified virus, but do not rule out bacterial infection or co-infection with other pathogens not detected by the test. Clinical correlation with patient history and other diagnostic information is necessary to determine patient infection status. The expected result is Negative.  Fact Sheet for Patients: EntrepreneurPulse.com.au  Fact Sheet for Healthcare Providers: IncredibleEmployment.be  This test is not yet approved or cleared by the [place] FDA and  has been authorized for detection and/or  diagnosis of SARS-CoV-2 by FDA under an Emergency Use Authorization (EUA).  This EUA will remain in effect (meaning this test can be used) for the duration of  the COVID-19 declaration under Section 564(b)(1) of the A ct, 21 U.S.C. section 360bbb-3(b)(1), unless the authorization is terminated or revoked sooner.     Influenza A by PCR NEGATIVE NEGATIVE Final   Influenza B by PCR NEGATIVE NEGATIVE Final    Comment: (NOTE) The Xpert Xpress SARS-CoV-2/FLU/RSV plus assay is intended as an aid in the diagnosis of influenza from Nasopharyngeal swab specimens and should not be used as a sole basis for treatment. Nasal washings and aspirates are unacceptable for Xpert Xpress SARS-CoV-2/FLU/RSV testing.  Fact Sheet for Patients: EntrepreneurPulse.com.au  Fact Sheet for Healthcare Providers: IncredibleEmployment.be  This test is not yet approved or cleared by the [place] FDA and has been authorized for detection and/or diagnosis of SARS-CoV-2 by FDA under an Emergency Use Authorization (EUA). This EUA will remain in effect (meaning this test can be used) for the duration of the COVID-19 declaration under Section 564(b)(1) of the Act, 21 U.S.C. section 360bbb-3(b)(1), unless the authorization is terminated or revoked.  Performed at [facility], [address], [place], [place] [address]   Blood culture (  routine x 2)     Status: None (Preliminary result)   Collection Time: 12/26/21 10:33 PM   Specimen: BLOOD  Result Value Ref Range Status   Specimen Description BLOOD LEFT ASSIST CONTROL  Final   Special Requests   Final    BOTTLES DRAWN AEROBIC AND ANAEROBIC Blood Culture adequate volume   Culture   Final    NO GROWTH < 12 HOURS Performed at [facility], [address], [place], [place] [address]    Report Status PENDING  Incomplete  Blood culture (routine x 2)     Status: None (Preliminary result)   Collection Time:  12/26/21 10:46 PM   Specimen: BLOOD  Result Value Ref Range Status   Specimen Description BLOOD RIGHT ASSIST CONTROL  Final   Special Requests   Final    BOTTLES DRAWN AEROBIC AND ANAEROBIC Blood Culture adequate volume   Culture   Final    NO GROWTH < 12 HOURS Performed at [facility], [address], [place], [place] [address]    Report Status PENDING  Incomplete     Radiologic Imaging: CT images were personally reviewed and interpreted  CT ABDOMEN PELVIS WO CONTRAST  Addendum Date: 12/26/2021   ADDENDUM REPORT: 12/26/2021 23:44 ADDENDUM: It should be noted that the air seen within the wall of the urinary bladder may represent sequelae associated with the previously described malpositioned right-sided endo ureteral stent. Electronically Signed   By: Virgina Norfolk M.D.   On: 12/26/2021 23:44   Result Date: 12/26/2021 CLINICAL DATA:  Patient with altered mentation and recent history of COVID positive. EXAM: CT ABDOMEN AND PELVIS WITHOUT CONTRAST TECHNIQUE: Multidetector CT imaging of the abdomen and pelvis was performed following the standard protocol without IV contrast. RADIATION DOSE REDUCTION: This exam was performed according to the departmental dose-optimization program which includes automated exposure control, adjustment of the mA and/or kV according to patient size and/or use of iterative reconstruction technique. COMPARISON:  July 14, 2019 FINDINGS: Lower chest: No acute abnormality. Hepatobiliary: No focal liver abnormality is seen. No gallstones, gallbladder wall thickening, or biliary dilatation. Pancreas: Unremarkable. No pancreatic ductal dilatation or surrounding inflammatory changes. Spleen: There is mild to moderate severity splenomegaly. Adrenals/Urinary Tract: Adrenal glands are unremarkable. Kidneys are normal in size, without focal lesions. Bilateral endo ureteral stents are noted. The proximal portion of the right-sided endo ureteral stent is seen  inferior to the right renal pelvis (coronal reformatted images 46 through 50, CT series 5), while the left-sided endo ureteral stent is properly positioned. Ill-defined subcentimeter renal calculi are seen within the right renal pelvis which is mildly dilated. Numerous subcentimeter non-obstructing renal calculi are seen within the left kidney. There is marked severity diffuse urinary bladder wall thickening with air suspected within the lateral aspect of the bladder wall on the right. A Foley catheter is also present within the bladder lumen. Stomach/Bowel: Stomach is within normal limits. Appendix appears normal. No evidence of bowel wall thickening, distention, or inflammatory changes. Vascular/Lymphatic: Aortic atherosclerosis. No enlarged abdominal or pelvic lymph nodes. Reproductive: The prostate gland is remarkable. Other: No abdominal wall hernia or abnormality. No abdominopelvic ascites. Musculoskeletal: Multilevel degenerative changes seen throughout the lumbar spine. IMPRESSION: 1. Bilateral endo ureteral stents, with a malpositioned right-sided endo ureteral stent, as described above. Surgical an urology consultation is recommended. 2. Marked severity diffuse urinary bladder wall thickening with air suspected within the lateral aspect of the bladder wall on the right which may represent sequelae associated with cystitis. The  presence of an underlying neoplastic process cannot be excluded. 3. Renal calculi within the right renal pelvis with numerous subcentimeter non-obstructing left renal calculi. 4. Splenomegaly. 5. Aortic atherosclerosis. Aortic Atherosclerosis (ICD10-I70.0). Electronically Signed: By: Virgina Norfolk M.D. On: 12/26/2021 23:40   DG Chest Portable 1 View  Result Date: 12/26/2021 CLINICAL DATA:  New atrial fibrillation. EXAM: PORTABLE CHEST 1 VIEW COMPARISON:  Chest radiograph 10/14/2019 FINDINGS: Stable heart size allowing for differences in technique. Unchanged mediastinal  contours. Aortic atherosclerosis and coronary stent. Mild hyperinflation and bronchial thickening, chronic. No acute airspace disease. No pulmonary edema. No pleural effusion or pneumothorax no acute osseous findings. IMPRESSION: 1. No acute chest findings. 2. Mild chronic hyperinflation and bronchial thickening. 3. Aortic atherosclerosis and coronary stent. Electronically Signed   By: Keith Rake M.D.   On: 12/26/2021 20:46    Impression/Recommendation:   1.  Bilateral obstructing distal ureteral calculi Proximal migration of his right ureteral stent though it is well above the distal ureter.  No significant hydronephrosis on CT.  Stent was difficult to place at [facility].   We discussed the possible need of stent repositioning however would continue conservative management for now If he does require stent repositioning would recommend cardiology clearance  2.  Chronic urinary retention Continue Foley catheter drainage   12/27/2021, 7:48 AM  John Giovanni,  MD

## 2021-12-27 NOTE — ED Notes (Signed)
This writer went into room to find pt laying sideways in bed with feet lying on top of the recliner beside the bed. Pt informed that he cannot relax in this manner and that I would get him a hospital bed that he can control to make himself comfortable. Pt placed in hospital bed with no issue with assistance of Shawn, NT. Pt placed back on cardiac monitor and bed alarm set. Primary RN informed.

## 2021-12-27 NOTE — Progress Notes (Signed)
Pharmacy Antibiotic Note  Wayne Burns is a 66 y.o. male admitted on 12/26/2021 with sepsis secondary to UTI.  Pharmacy has been consulted for Cefepime dosing.  Plan: Cefepime 2 gm q24h per indication and renal fxn. Pharmacy will continue to follow and adjust abx dosing if warranted.  Height: 5\' 9"  (175.3 cm) Weight: 72.6 kg (160 lb) IBW/kg (Calculated) : 70.7  Temp (24hrs), Avg:98.2 F (36.8 C), Min:98.2 F (36.8 C), Max:98.2 F (36.8 C)  Recent Labs  Lab 12/26/21 2020  WBC 19.2*  CREATININE 3.63*  LATICACIDVEN 1.7    Estimated Creatinine Clearance: 20.8 mL/min (A) (by C-G formula based on SCr of 3.63 mg/dL (H)).    No Known Allergies  Antimicrobials this admission: 2/21 Cefepime >>   Microbiology results: 2/21 BCx: Pending  Thank you for allowing pharmacy to be a part of this patients care.  Renda Rolls, PharmD, MBA 12/27/2021 1:17 AM

## 2021-12-27 NOTE — ED Notes (Signed)
IVC/ failure to thrive

## 2021-12-27 NOTE — TOC Progression Note (Signed)
Transition of Care [facility]) - Progression Note    Patient Details  Name: Wayne Burns MRN: [id number] Date of Birth: 20-Dec-1955  [facility] CM/SW Contact  Shelbie Hutching, RN Phone Number: 12/27/2021, 3:10 PM  Clinical Narrative:    [facility] consult acknowledged.  Patient is being admitted to the hospital for sepsis, going to the ICU.  TOC will follow.         Expected Discharge Plan and Services                                                 Social Determinants of Health (SDOH) Interventions    Readmission Risk Interventions No flowsheet data found.

## 2021-12-27 NOTE — Consult Note (Signed)
NAMEDejan Burns  DOB: 01-23-1956  MRN: [id number]  Date/Time: 12/27/2021 10:21 PM  REQUESTING PROVIDER: Dr.Adhikari Subjective:  REASON FOR CONSULT: ESBL e.coli bacteremia ? Wayne Burns is a 66 y.o. male with chronic urinary retention needing self cath, b/l renal stones, chronic OM of rt ankle, presents to ED from home with IVC  because he was confused defecating  and urinating on himself history of recent hospitalization at [facility] between 12/12/21-12/22/21  for sepsis due to  b/l UPJ stones causing obstruction. He was transferred from [facility] to main [facility] for possible perc nephrostomy. Hospital course at [facility] complicated by Acute hypoxic resp failure needing intubation 9 2/8-2/9) , new HFrEF, MI and upper GI bleed. On presentation his cr was 10 , he had LPCN placed on 2/8 by IR, but exchanged with b/l ureteral stents by urology on 12/14/21. Improvement in cr to 3.5,. which wrosened to 3.64 with US showing worsening hydronephrosis thought to be due to improper self cath by patient  which improved. 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 Started aspirin. He had GI bleed during the stay and upper endoscopy showed old gastric ulcers He also had covid and superimposed PNA and got remdesivir X 5 days and Po dexa for 10 days UC was positive for kleb ( R to quinolone, bactrim, zosyn) and proteus He was discharged on cefdinir Cr on 2/17 on discharge was 2.80  In the ED BP 105/68, temp 98.2, Pulse 118, RR 25 and stas 93% WBC 19.2, HB 10.2, plt 207, cr 3.63 CT abdomen and pelvis showed b/l ureteral stents. Marked severity urinary bladder wall thickening A foley was placed Seen by Dr.Stoioff who did not feel the need for urgernt urological intervention As blood culture came positive for ESBL e.coli his antibiotic switched to meropenem and I am seeing the patient for the  same  Past Medical History:  Diagnosis Date   COPD (chronic obstructive pulmonary disease) ([place])    Hypertension     Past Surgical History:  Procedure Laterality Date   ANKLE SURGERY      Social History   Socioeconomic History   Marital status: Single    Spouse name: Not on file   Number of children: Not on file   Years of education: Not on file   Highest education level: Not on file  Occupational History   Not on file  Tobacco Use   Smoking status: Former    Packs/day: 0.50    Types: Cigarettes   Smokeless tobacco: Never  Vaping Use   Vaping Use: Never used  Substance and Sexual Activity   Alcohol use: Not Currently    Comment: often   Drug use: Not Currently   Sexual activity: Not on file  Other Topics Concern   Not on file  Social History Narrative   Not on file   Social Determinants of Health   Financial Resource Strain: Not on file  Food Insecurity: Not on file  Transportation Needs: Not on file  Physical Activity: Not on file  Stress: Not on file  Social Connections: Not on file  Intimate Partner Violence: Not on file    History reviewed. No pertinent family history. No Known Allergies I? Current Facility-Administered Medications  Medication Dose Route Frequency Provider Last Rate Last Admin   0.9 %  sodium chloride infusion  250 mL Intravenous PRN Para Skeans, MD  0.9 %  sodium chloride infusion   Intravenous Continuous Colon Flattery, NP   Stopped at 12/27/21 1907   0.9 %  sodium chloride infusion  250 mL Intravenous Continuous Bradly Bienenstock, NP       acetaminophen (TYLENOL) tablet 650 mg  650 mg Oral Q6H PRN Para Skeans, MD   650 mg at 12/27/21 6384   Or   acetaminophen (TYLENOL) suppository 650 mg  650 mg Rectal Q6H PRN Para Skeans, MD       amiodarone (NEXTERONE PREMIX) 360-4.14 MG/200ML-% (1.8 mg/mL) IV infusion  30 mg/hr Intravenous Continuous Tristan Schroeder, PA-C 16.67 mL/hr at 12/27/21 1853 30 mg/hr at 12/27/21 1853    heparin injection 5,000 Units  5,000 Units Subcutaneous Q12H Para Skeans, MD   5,000 Units at 12/27/21 1520   levalbuterol (XOPENEX HFA) inhaler 2 puff  2 puff Inhalation Q8H Adhikari, Amrit, MD       meropenem (MERREM) 500 mg in sodium chloride 0.9 % 100 mL IVPB  500 mg Intravenous Q12H Zeigler, Dustin G, RPH       methylPREDNISolone sodium succinate (SOLU-MEDROL) 125 mg/2 mL injection 80 mg  80 mg Intravenous Q24H Adhikari, Amrit, MD   80 mg at 12/27/21 1109   midodrine (PROAMATINE) tablet 10 mg  10 mg Oral TID WC Shelly Coss, MD   10 mg at 12/27/21 1858   ondansetron (ZOFRAN) tablet 4 mg  4 mg Oral Q6H PRN Para Skeans, MD       Or   ondansetron (ZOFRAN) injection 4 mg  4 mg Intravenous Q6H PRN Para Skeans, MD       pantoprazole (PROTONIX) injection 40 mg  40 mg Intravenous Q12H Florina Ou V, MD   40 mg at 12/27/21 1109   phenylephrine (NEO-SYNEPHRINE) 20mg /NS 258mL premix infusion  25-200 mcg/min Intravenous Titrated Darel Hong D, NP 48.8 mL/hr at 12/27/21 2139 65 mcg/min at 12/27/21 2139   sodium bicarbonate 150 mEq in dextrose 5 % 1,150 mL infusion   Intravenous Continuous Flora Lipps, MD 100 mL/hr at 12/27/21 1907 New Bag at 12/27/21 1907   sodium chloride flush (NS) 0.9 % injection 3 mL  3 mL Intravenous Q12H Florina Ou V, MD   3 mL at 12/27/21 0932   sodium chloride flush (NS) 0.9 % injection 3 mL  3 mL Intravenous Q12H Florina Ou V, MD   3 mL at 12/27/21 0932   sodium chloride flush (NS) 0.9 % injection 3 mL  3 mL Intravenous PRN Para Skeans, MD       tamsulosin [facility]) capsule 0.4 mg  0.4 mg Oral Daily Florina Ou V, MD   0.4 mg at 12/27/21 1109   [START ON 12/28/2021] tiotropium (SPIRIVA) inhalation capsule ([facility] use ONLY) 18 mcg  1 capsule Inhalation Daily Para Skeans, MD       Current Outpatient Medications  Medication Sig Dispense Refill   amLODipine (NORVASC) 10 MG tablet Take 10 mg by mouth daily.     atorvastatin (LIPITOR) 80 MG tablet Take 80 mg by mouth  daily.     cefdinir (OMNICEF) 300 MG capsule Take 300 mg by mouth once.     pantoprazole (PROTONIX) 40 MG tablet Take 40 mg by mouth 2 (two) times daily.     sertraline (ZOLOFT) 100 MG tablet Take 100 mg by mouth 2 (two) times daily.     sertraline (ZOLOFT) 50 MG tablet Take 50 mg by mouth daily.  tamsulosin (FLOMAX) 0.4 MG CAPS capsule Take 0.4 mg by mouth daily.     tiotropium (SPIRIVA HANDIHALER) 18 MCG inhalation capsule Place 1 capsule into inhaler and inhale daily.     traZODone (DESYREL) 50 MG tablet Take 100 mg by mouth at bedtime.       Abtx:  Anti-infectives (From admission, onward)    Start     Dose/Rate Route Frequency Ordered Stop   12/27/21 2300  cefTRIAXone (ROCEPHIN) 1 g in sodium chloride 0.9 % 100 mL IVPB  Status:  Discontinued        1 g 200 mL/hr over 30 Minutes Intravenous Every 24 hours 12/27/21 0901 12/27/21 1143   12/27/21 2200  ceFEPIme (MAXIPIME) 2 g in sodium chloride 0.9 % 100 mL IVPB  Status:  Discontinued        2 g 200 mL/hr over 30 Minutes Intravenous Every 24 hours 12/27/21 0119 12/27/21 0901   12/27/21 2200  meropenem (MERREM) 500 mg in sodium chloride 0.9 % 100 mL IVPB        500 mg 200 mL/hr over 30 Minutes Intravenous Every 12 hours 12/27/21 1148     12/27/21 1230  meropenem (MERREM) 1 g in sodium chloride 0.9 % 100 mL IVPB        1 g 200 mL/hr over 30 Minutes Intravenous  Once 12/27/21 1148 12/27/21 1549   12/26/21 2230  ceFEPIme (MAXIPIME) 2 g in sodium chloride 0.9 % 100 mL IVPB        2 g 200 mL/hr over 30 Minutes Intravenous  Once 12/26/21 2220 12/26/21 2318       REVIEW OF SYSTEMS:  Const:  fever,  chills, negative weight loss Eyes: negative diplopia or visual changes, negative eye pain ENT: negative coryza, negative sore throat Resp: + cough, hemoptysis, dyspnea Cards: negative for chest pain, palpitations, lower extremity edema GU:  fself cath, but was not doing it for the past 2 days  GI:  abdominal pain, no diarrhea, bleeding,  constipation Skin: negative for rash and pruritus Heme: negative for easy bruising and gum/nose bleeding MS:  myalgias,  muscle weakness, rt lower extremity prior fracture site surgery- chronic drainage Neurolo:confusion  Psych:  anxiety,  Endocrine: negative for thyroid, diabetes Allergy/Immunology- negative for any medication or food allergies  Objective:  VITALS:  BP 108/66    Pulse 97    Temp 98.2 F (36.8 C) (Oral)    Resp 19    Ht 5\' 9"  (1.753 m)    Wt 72.6 kg    SpO2 100%    BMI 23.63 kg/m  PHYSICAL EXAM:  General: Alert, cooperative, disheveled, repsonds to questions appropriately Head: Normocephalic, without obvious abnormality, atraumatic. Eyes: Conjunctivae clear, anicteric sclerae. Pupils are equal ENT Nares normal. No drainage or sinus tenderness. Lips, mucosa, and tongue normal. No Thrush Neck: Supple, symmetrical, no adenopathy, thyroid: non tender no carotid bruit and no JVD. Back: No CVA tenderness. Lungs:b/la ir entry Heart: TAchycardia Abdomen: Soft, non-tender,not distended. Bowel sounds normal. No masses Foley- cloudy urine Extremities: rt lower extremity- surgical scar Skin: No rashes or lesions. Or bruising Lymph: Cervical, supraclavicular normal. Neurologic: Grossly non-focal Pertinent Labs Lab Results CBC    Component Value Date/Time   WBC 14.4 (H) 12/27/2021 0532   RBC 2.87 (L) 12/27/2021 0532   HGB 8.3 (L) 12/27/2021 0532   HGB 15.8 01/21/2014 1624   HCT 25.6 (L) 12/27/2021 0532   HCT 46.8 01/21/2014 1624   PLT 156 12/27/2021 0532   PLT 288  01/21/2014 1624   MCV 89.2 12/27/2021 0532   MCV 96 01/21/2014 1624   MCH 28.9 12/27/2021 0532   MCHC 32.4 12/27/2021 0532   RDW 17.8 (H) 12/27/2021 0532   RDW 13.0 01/21/2014 1624   LYMPHSABS 2.6 07/14/2019 0257   MONOABS 0.5 07/14/2019 0257   EOSABS 0.3 07/14/2019 0257   BASOSABS 0.1 07/14/2019 0257    CMP Latest Ref Rng & Units 12/27/2021 12/26/2021 07/14/2019  Glucose 70 - 99 mg/dL 138(H) 156(H)  150(H)  BUN 8 - 23 mg/dL 64(H) 65(H) 6  Creatinine 0.61 - 1.24 mg/dL 3.82(H) 3.63(H) 0.86  Sodium 135 - 145 mmol/L 132(L) 131(L) 144  Potassium 3.5 - 5.1 mmol/L 4.2 4.0 3.1(L)  Chloride 98 - 111 mmol/L 105 100 111  CO2 22 - 32 mmol/L 17(L) 18(L) 19(L)  Calcium 8.9 - 10.3 mg/dL 8.0(L) 8.7(L) 8.4(L)  Total Protein 6.5 - 8.1 g/dL 6.8 8.1 8.2(H)  Total Bilirubin 0.3 - 1.2 mg/dL 1.5(H) 2.1(H) 0.8  Alkaline Phos 38 - 126 U/L 167(H) 204(H) 96  AST 15 - 41 U/L 28 31 42(H)  ALT 0 - 44 U/L 44 55(H) 29      Microbiology: Recent Results (from the past 240 hour(s))  Resp Panel by RT-PCR (Flu A&B, Covid)     Status: Abnormal   Collection Time: 12/26/21 10:33 PM   Specimen: Nasopharyngeal(NP) swabs in vial transport medium  Result Value Ref Range Status   SARS Coronavirus 2 by RT PCR POSITIVE (A) NEGATIVE Final    Comment: (NOTE) SARS-CoV-2 target nucleic acids are DETECTED.  The SARS-CoV-2 RNA is generally detectable in upper respiratory specimens during the acute phase of infection. Positive results are indicative of the presence of the identified virus, but do not rule out bacterial infection or co-infection with other pathogens not detected by the test. Clinical correlation with patient history and other diagnostic information is necessary to determine patient infection status. The expected result is Negative.  Fact Sheet for Patients: EntrepreneurPulse.com.au  Fact Sheet for Healthcare Providers: IncredibleEmployment.be  This test is not yet approved or cleared by the [place] FDA and  has been authorized for detection and/or diagnosis of SARS-CoV-2 by FDA under an Emergency Use Authorization (EUA).  This EUA will remain in effect (meaning this test can be used) for the duration of  the COVID-19 declaration under Section 564(b)(1) of the A ct, 21 U.S.C. section 360bbb-3(b)(1), unless the authorization is terminated or revoked sooner.      Influenza A by PCR NEGATIVE NEGATIVE Final   Influenza B by PCR NEGATIVE NEGATIVE Final    Comment: (NOTE) The Xpert Xpress SARS-CoV-2/FLU/RSV plus assay is intended as an aid in the diagnosis of influenza from Nasopharyngeal swab specimens and should not be used as a sole basis for treatment. Nasal washings and aspirates are unacceptable for Xpert Xpress SARS-CoV-2/FLU/RSV testing.  Fact Sheet for Patients: EntrepreneurPulse.com.au  Fact Sheet for Healthcare Providers: IncredibleEmployment.be  This test is not yet approved or cleared by the [place] FDA and has been authorized for detection and/or diagnosis of SARS-CoV-2 by FDA under an Emergency Use Authorization (EUA). This EUA will remain in effect (meaning this test can be used) for the duration of the COVID-19 declaration under Section 564(b)(1) of the Act, 21 U.S.C. section 360bbb-3(b)(1), unless the authorization is terminated or revoked.  Performed at [facility] [address] [place], [place] [address]   Blood culture (routine x 2)     Status: None (Preliminary result)   Collection  Time: 12/26/21 10:33 PM   Specimen: BLOOD  Result Value Ref Range Status   Specimen Description BLOOD LEFT ASSIST CONTROL  Final   Special Requests   Final    BOTTLES DRAWN AEROBIC AND ANAEROBIC Blood Culture adequate volume   Culture   Final    NO GROWTH < 12 HOURS Performed at [facility], [address] [place], [place] [address]    Report Status PENDING  Incomplete  Blood culture (routine x 2)     Status: None (Preliminary result)   Collection Time: 12/26/21 10:46 PM   Specimen: BLOOD  Result Value Ref Range Status   Specimen Description   Final    BLOOD RIGHT ANTECUBITAL Performed at [facility], [place] [address], [place], [place] [address]    Special Requests   Final    BOTTLES DRAWN AEROBIC AND ANAEROBIC Blood Culture adequate volume Performed at [facility], [address] [place],  [address]    Culture  Setup Time   Final    GRAM NEGATIVE RODS ANAEROBIC BOTTLE ONLY Organism ID to follow CRITICAL RESULT CALLED TO, READ BACK BY AND VERIFIED WITH: Lu Duffel PHARMD [id number] 12/27/21 HNM    Culture PENDING  Incomplete   Report Status PENDING  Incomplete  Blood Culture ID Panel (Reflexed)     Status: Abnormal   Collection Time: 12/26/21 10:46 PM  Result Value Ref Range Status   Enterococcus faecalis NOT DETECTED NOT DETECTED Final   Enterococcus Faecium NOT DETECTED NOT DETECTED Final   Listeria monocytogenes NOT DETECTED NOT DETECTED Final   Staphylococcus species NOT DETECTED NOT DETECTED Final   Staphylococcus aureus (BCID) NOT DETECTED NOT DETECTED Final   Staphylococcus epidermidis NOT DETECTED NOT DETECTED Final   Staphylococcus lugdunensis NOT DETECTED NOT DETECTED Final   Streptococcus species NOT DETECTED NOT DETECTED Final   Streptococcus agalactiae NOT DETECTED NOT DETECTED Final   Streptococcus pneumoniae NOT DETECTED NOT DETECTED Final   Streptococcus pyogenes NOT DETECTED NOT DETECTED Final   A.calcoaceticus-baumannii NOT DETECTED NOT DETECTED Final   Bacteroides fragilis NOT DETECTED NOT DETECTED Final   Enterobacterales DETECTED (A) NOT DETECTED Final    Comment: Enterobacterales represent a large order of gram negative bacteria, not a single organism. CRITICAL RESULT CALLED TO, READ BACK BY AND VERIFIED WITH: DEVAN MITCHELL PHARMD 1126 12/27/21 HNM    Enterobacter cloacae complex NOT DETECTED NOT DETECTED Final   Escherichia coli DETECTED (A) NOT DETECTED Final    Comment: CRITICAL RESULT CALLED TO, READ BACK BY AND VERIFIED WITH: DEVAN MITCHELL [id number] 12/27/21 HNM    Klebsiella aerogenes NOT DETECTED NOT DETECTED Final   Klebsiella oxytoca NOT DETECTED NOT DETECTED Final   Klebsiella pneumoniae NOT DETECTED NOT DETECTED Final   Proteus species NOT DETECTED NOT DETECTED Final   Salmonella  species NOT DETECTED NOT DETECTED Final   Serratia marcescens NOT DETECTED NOT DETECTED Final   Haemophilus influenzae NOT DETECTED NOT DETECTED Final   Neisseria meningitidis NOT DETECTED NOT DETECTED Final   Pseudomonas aeruginosa NOT DETECTED NOT DETECTED Final   Stenotrophomonas maltophilia NOT DETECTED NOT DETECTED Final   Candida albicans NOT DETECTED NOT DETECTED Final   Candida auris NOT DETECTED NOT DETECTED Final   Candida glabrata NOT DETECTED NOT DETECTED Final   Candida krusei NOT DETECTED NOT DETECTED Final   Candida parapsilosis NOT DETECTED NOT DETECTED Final   Candida tropicalis NOT DETECTED NOT DETECTED Final   Cryptococcus neoformans/gattii NOT DETECTED NOT DETECTED Final  CTX-M ESBL DETECTED (A) NOT DETECTED Final    Comment: CRITICAL RESULT CALLED TO, READ BACK BY AND VERIFIED WITH: DEVAN MITCHELL PHARMD 1126 12/27/21 HNM (NOTE) Extended spectrum beta-lactamase detected. Recommend a carbapenem as initial therapy.      Carbapenem resistance IMP NOT DETECTED NOT DETECTED Final   Carbapenem resistance KPC NOT DETECTED NOT DETECTED Final   Carbapenem resistance NDM NOT DETECTED NOT DETECTED Final   Carbapenem resist OXA 48 LIKE NOT DETECTED NOT DETECTED Final   Carbapenem resistance VIM NOT DETECTED NOT DETECTED Final    Comment: Performed at [facility], [address] [place], [place] [address]    IMAGING RESULTS:  I have personally reviewed the films Thickened bladder wall with air  ? 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 ? ___________________________________________________ Discussed with patient, and pharmacist Note:  This document was prepared using Dragon voice recognition software and may include unintentional dictation errors.

## 2021-12-27 NOTE — Progress Notes (Signed)
PROGRESS NOTE  Wayne Burns  TDS:[id number] DOB: 20-Sep-1956 DOA: 12/26/2021 PCP: Jerene Pitch, MD   Brief Narrative: Patient is a 66 year old male who was brought by his family as an IVC for not being able to take care of himself, living in poor condition, refusing to go to hospital.  As per report, he recently left AMA from [facility] where he was being treated for kidney stones, COVID.  On condition he was tachycardic, disoriented.  Lab work showed AKI with creatinine in the range of 3, leukocytosis, elevated BNP.  COVID screen test was positive.  Urinalysis consistent with UTI.  Hospital course also remarkable for hypotension, A-fib with RVR.  Cardiology, nephrology, urology consulted   Assessment & Plan:  Principal Problem:   Sepsis ([place]) Active Problems:   Abnormal LFTs (liver function tests)   Depression   Essential hypertension   Urinary tract infection   Involuntary commitment   AKI (acute kidney injury) ([place])   Elevated troponin   Elevated glucose   Atrial fibrillation with RVR ([place])   Ureteral stent displacement, initial encounter ([place])   COVID-19 virus infection   COPD with acute exacerbation ([place])   Assessment and Plan: * Sepsis ([place])- (present on admission) Presents with tachycardia, hypotension, leukocytosis.  Urinalysis suggestive  of UTI. Started on IV fluids.  Follow-up urine culture, blood culture  COPD with acute exacerbation ([place]) Wheezing today.  Currently does not smoke.  Started on bronchodilators, steroids  COVID-19 virus infection Currently symptomatic, on room air. Chest xray did not show any acute findings.  Continue to monitor.  He was diagnosed with COVID earlier at [facility]  Ureteral stent displacement, initial encounter [facility])- (present on admission) Pt has right stent malposition. Urology consulted- Dr. Bernardo Heater consulted and case d/w him. Ct does not show hydronephrosis but has h/o rt hydronephrosis.   Atrial fibrillation with RVR  ([place])- (present on admission) Pt presenting with new onset .afib rvr, no history of anticoagulation use.  We will monitor on telemetry Current CHADVasc score of 1. Currently heparin subcu for DVT prophylaxis Cardiology consulted.  Blood pressure is soft so cannot start  on Cardizem drip  Elevated glucose- (present on admission) No history of diabetes mellitus type 2.  Monitor blood sugars.  Continue sliding scale if needed  Elevated troponin- (present on admission) Attribute patient's elevated troponin to sepsis, tachycardia.  Denies any chest pain   AKI (acute kidney injury) ([place])- (present on admission) CT abdomen/pelvis showed bilateral endo ureteral stents, with a malpositioned right-sided endo ureteral stent.  Urology consulted.  CT also suggested severe bladder wall thickening consistent with cystitis. Showed Renal stones.  Continue ceftriaxone for now.  Came with Foley from home.  Nephrology consulted  Involuntary commitment Patient on involuntary commitment by his family because he was not taking care of himself, living in poor conditions.  Social worker will be consulted  Urinary tract infection- (present on admission) Urinary tract infection with acute kidney injury. Started on cefepime.  Will change to ceftriaxone. Follow cultures and sensitivity and tailor therapy further.  Essential hypertension- (present on admission) Blood pressure currently soft.  Home amlodipine held.  Monitor blood pressure  Depression- (present on admission) Patient currently under involuntary commitment. Was taking sertraline for depression  Abnormal LFTs (liver function tests)- (present on admission) Currently stable              DVT prophylaxis:heparin injection 5,000 Units Start: 12/27/21 0200 SCDs Start: 12/27/21 0142     Code Status: Full Code  Family Communication: None at bedside  Patient status:inpatient  Patient is from :Home  Anticipated discharge  NA:TFTD  Estimated DC date:Not sure   Consultants: Cardiology, nephrology, urology  Procedures: None  Antimicrobials:  Anti-infectives (From admission, onward)    Start     Dose/Rate Route Frequency Ordered Stop   12/27/21 2300  cefTRIAXone (ROCEPHIN) 1 g in sodium chloride 0.9 % 100 mL IVPB        1 g 200 mL/hr over 30 Minutes Intravenous Every 24 hours 12/27/21 0901     12/27/21 2200  ceFEPIme (MAXIPIME) 2 g in sodium chloride 0.9 % 100 mL IVPB  Status:  Discontinued        2 g 200 mL/hr over 30 Minutes Intravenous Every 24 hours 12/27/21 0119 12/27/21 0901   12/26/21 2230  ceFEPIme (MAXIPIME) 2 g in sodium chloride 0.9 % 100 mL IVPB        2 g 200 mL/hr over 30 Minutes Intravenous  Once 12/26/21 2220 12/26/21 2318       Subjective: Patient seen and examined at the bedside this morning.  Blood pressure was soft, EKG monitor showed A-fib with heart rate in the range of 120-140s.  Complains of weakness.  Foley in place with drainage of urine.  Alert and oriented.  Objective: Vitals:   12/27/21 0900 12/27/21 0906 12/27/21 0930 12/27/21 0945  BP: (!) 89/62 (!) 80/52 (!) 87/49   Pulse: (!) 30 76  80  Resp: (!) 29 (!) 22 (!) 25 20  Temp:      TempSrc:      SpO2: 98% 96% 98% 94%  Weight:      Height:        Intake/Output Summary (Last 24 hours) at 12/27/2021 0954 Last data filed at 12/26/2021 2338 Gross per 24 hour  Intake 999 ml  Output 1800 ml  Net -801 ml   Filed Weights   12/26/21 2013  Weight: 72.6 kg    Examination:  General exam: Very deconditioned, chronically looking, weak HEENT: PERRL Respiratory system: Bilateral expiratory wheezing Cardiovascular system: A-fib with RVR.  Gastrointestinal system: Abdomen is nondistended, soft and nontender. Central nervous system: Alert and oriented Extremities: No edema, no clubbing ,no cyanosis Skin: No rashes, no ulcers,no icterus   GU: foley   Data Reviewed: I have personally reviewed following labs and  imaging studies  CBC: Recent Labs  Lab 12/26/21 2020 12/27/21 0532  WBC 19.2* 14.4*  HGB 10.2* 8.3*  HCT 32.2* 25.6*  MCV 89.2 89.2  PLT 207 322   Basic Metabolic Panel: Recent Labs  Lab 12/26/21 2020 12/27/21 0532  NA 131* 132*  K 4.0 4.2  CL 100 105  CO2 18* 17*  GLUCOSE 156* 138*  BUN 65* 64*  CREATININE 3.63* 3.82*  CALCIUM 8.7* 8.0*     Recent Results (from the past 240 hour(s))  Resp Panel by RT-PCR (Flu A&B, Covid)     Status: Abnormal   Collection Time: 12/26/21 10:33 PM   Specimen: Nasopharyngeal(NP) swabs in vial transport medium  Result Value Ref Range Status   SARS Coronavirus 2 by RT PCR POSITIVE (A) NEGATIVE Final    Comment: (NOTE) SARS-CoV-2 target nucleic acids are DETECTED.  The SARS-CoV-2 RNA is generally detectable in upper respiratory specimens during the acute phase of infection. Positive results are indicative of the presence of the identified virus, but do not rule out bacterial infection or co-infection with other pathogens not detected by the test. Clinical correlation with patient history and  other diagnostic information is necessary to determine patient infection status. The expected result is Negative.  Fact Sheet for Patients: EntrepreneurPulse.com.au  Fact Sheet for Healthcare Providers: IncredibleEmployment.be  This test is not yet approved or cleared by the [place] FDA and  has been authorized for detection and/or diagnosis of SARS-CoV-2 by FDA under an Emergency Use Authorization (EUA).  This EUA will remain in effect (meaning this test can be used) for the duration of  the COVID-19 declaration under Section 564(b)(1) of the A ct, 21 U.S.C. section 360bbb-3(b)(1), unless the authorization is terminated or revoked sooner.     Influenza A by PCR NEGATIVE NEGATIVE Final   Influenza B by PCR NEGATIVE NEGATIVE Final    Comment: (NOTE) The Xpert Xpress SARS-CoV-2/FLU/RSV plus assay is  intended as an aid in the diagnosis of influenza from Nasopharyngeal swab specimens and should not be used as a sole basis for treatment. Nasal washings and aspirates are unacceptable for Xpert Xpress SARS-CoV-2/FLU/RSV testing.  Fact Sheet for Patients: EntrepreneurPulse.com.au  Fact Sheet for Healthcare Providers: IncredibleEmployment.be  This test is not yet approved or cleared by the [place] FDA and has been authorized for detection and/or diagnosis of SARS-CoV-2 by FDA under an Emergency Use Authorization (EUA). This EUA will remain in effect (meaning this test can be used) for the duration of the COVID-19 declaration under Section 564(b)(1) of the Act, 21 U.S.C. section 360bbb-3(b)(1), unless the authorization is terminated or revoked.  Performed at [facility], [address] [place], [place] [address]   Blood culture (routine x 2)     Status: None (Preliminary result)   Collection Time: 12/26/21 10:33 PM   Specimen: BLOOD  Result Value Ref Range Status   Specimen Description BLOOD LEFT ASSIST CONTROL  Final   Special Requests   Final    BOTTLES DRAWN AEROBIC AND ANAEROBIC Blood Culture adequate volume   Culture   Final    NO GROWTH < 12 HOURS Performed at [facility], [address] [place], [place] [address]    Report Status PENDING  Incomplete  Blood culture (routine x 2)     Status: None (Preliminary result)   Collection Time: 12/26/21 10:46 PM   Specimen: BLOOD  Result Value Ref Range Status   Specimen Description BLOOD RIGHT ASSIST CONTROL  Final   Special Requests   Final    BOTTLES DRAWN AEROBIC AND ANAEROBIC Blood Culture adequate volume   Culture   Final    NO GROWTH < 12 HOURS Performed at [facility], [address], [place], [place] [address]    Report Status PENDING  Incomplete     Radiology Studies: CT ABDOMEN PELVIS WO CONTRAST  Addendum Date: 12/26/2021   ADDENDUM  REPORT: 12/26/2021 23:44 ADDENDUM: It should be noted that the air seen within the wall of the urinary bladder may represent sequelae associated with the previously described malpositioned right-sided endo ureteral stent. Electronically Signed   By: Virgina Norfolk M.D.   On: 12/26/2021 23:44   Result Date: 12/26/2021 CLINICAL DATA:  Patient with altered mentation and recent history of COVID positive. EXAM: CT ABDOMEN AND PELVIS WITHOUT CONTRAST TECHNIQUE: Multidetector CT imaging of the abdomen and pelvis was performed following the standard protocol without IV contrast. RADIATION DOSE REDUCTION: This exam was performed according to the departmental dose-optimization program which includes automated exposure control, adjustment of the mA and/or kV according to patient size and/or use of iterative reconstruction technique. COMPARISON:  July 14, 2019 FINDINGS:  Lower chest: No acute abnormality. Hepatobiliary: No focal liver abnormality is seen. No gallstones, gallbladder wall thickening, or biliary dilatation. Pancreas: Unremarkable. No pancreatic ductal dilatation or surrounding inflammatory changes. Spleen: There is mild to moderate severity splenomegaly. Adrenals/Urinary Tract: Adrenal glands are unremarkable. Kidneys are normal in size, without focal lesions. Bilateral endo ureteral stents are noted. The proximal portion of the right-sided endo ureteral stent is seen inferior to the right renal pelvis (coronal reformatted images 46 through 50, CT series 5), while the left-sided endo ureteral stent is properly positioned. Ill-defined subcentimeter renal calculi are seen within the right renal pelvis which is mildly dilated. Numerous subcentimeter non-obstructing renal calculi are seen within the left kidney. There is marked severity diffuse urinary bladder wall thickening with air suspected within the lateral aspect of the bladder wall on the right. A Foley catheter is also present within the bladder  lumen. Stomach/Bowel: Stomach is within normal limits. Appendix appears normal. No evidence of bowel wall thickening, distention, or inflammatory changes. Vascular/Lymphatic: Aortic atherosclerosis. No enlarged abdominal or pelvic lymph nodes. Reproductive: The prostate gland is remarkable. Other: No abdominal wall hernia or abnormality. No abdominopelvic ascites. Musculoskeletal: Multilevel degenerative changes seen throughout the lumbar spine. IMPRESSION: 1. Bilateral endo ureteral stents, with a malpositioned right-sided endo ureteral stent, as described above. Surgical an urology consultation is recommended. 2. Marked severity diffuse urinary bladder wall thickening with air suspected within the lateral aspect of the bladder wall on the right which may represent sequelae associated with cystitis. The presence of an underlying neoplastic process cannot be excluded. 3. Renal calculi within the right renal pelvis with numerous subcentimeter non-obstructing left renal calculi. 4. Splenomegaly. 5. Aortic atherosclerosis. Aortic Atherosclerosis (ICD10-I70.0). Electronically Signed: By: Virgina Norfolk M.D. On: 12/26/2021 23:40   DG Chest Portable 1 View  Result Date: 12/26/2021 CLINICAL DATA:  New atrial fibrillation. EXAM: PORTABLE CHEST 1 VIEW COMPARISON:  Chest radiograph 10/14/2019 FINDINGS: Stable heart size allowing for differences in technique. Unchanged mediastinal contours. Aortic atherosclerosis and coronary stent. Mild hyperinflation and bronchial thickening, chronic. No acute airspace disease. No pulmonary edema. No pleural effusion or pneumothorax no acute osseous findings. IMPRESSION: 1. No acute chest findings. 2. Mild chronic hyperinflation and bronchial thickening. 3. Aortic atherosclerosis and coronary stent. Electronically Signed   By: Keith Rake M.D.   On: 12/26/2021 20:46    Scheduled Meds:  heparin  5,000 Units Subcutaneous Q12H   pantoprazole (PROTONIX) IV  40 mg Intravenous Q12H    sodium chloride flush  3 mL Intravenous Q12H   sodium chloride flush  3 mL Intravenous Q12H   tamsulosin  0.4 mg Oral Daily   [START ON 12/28/2021] tiotropium  1 capsule Inhalation Daily   Continuous Infusions:  sodium chloride     sodium chloride     sodium chloride 100 mL/hr at 12/27/21 8177   cefTRIAXone (ROCEPHIN)  IV       LOS: 0 days   Shelly Coss, MD [facility] P2/22/2023, 9:54 AM

## 2021-12-28 DIAGNOSIS — F320 Major depressive disorder, single episode, mild: Secondary | ICD-10-CM

## 2021-12-28 DIAGNOSIS — A498 Other bacterial infections of unspecified site: Secondary | ICD-10-CM | POA: Diagnosis not present

## 2021-12-28 DIAGNOSIS — N179 Acute kidney failure, unspecified: Secondary | ICD-10-CM | POA: Diagnosis not present

## 2021-12-28 DIAGNOSIS — I4891 Unspecified atrial fibrillation: Secondary | ICD-10-CM | POA: Diagnosis not present

## 2021-12-28 DIAGNOSIS — R7989 Other specified abnormal findings of blood chemistry: Secondary | ICD-10-CM | POA: Diagnosis not present

## 2021-12-28 DIAGNOSIS — R7881 Bacteremia: Secondary | ICD-10-CM | POA: Diagnosis not present

## 2021-12-28 DIAGNOSIS — N390 Urinary tract infection, site not specified: Secondary | ICD-10-CM | POA: Diagnosis not present

## 2021-12-28 DIAGNOSIS — Z22322 Carrier or suspected carrier of Methicillin resistant Staphylococcus aureus: Secondary | ICD-10-CM

## 2021-12-28 DIAGNOSIS — N3000 Acute cystitis without hematuria: Secondary | ICD-10-CM | POA: Diagnosis not present

## 2021-12-28 HISTORY — DX: Carrier or suspected carrier of methicillin resistant Staphylococcus aureus: Z22.322

## 2021-12-28 LAB — CBC WITH DIFFERENTIAL/PLATELET
Abs Immature Granulocytes: 0.28 10*3/uL — ABNORMAL HIGH (ref 0.00–0.07)
Basophils Absolute: 0 10*3/uL (ref 0.0–0.1)
Basophils Relative: 0 %
Eosinophils Absolute: 0 10*3/uL (ref 0.0–0.5)
Eosinophils Relative: 0 %
HCT: 25.4 % — ABNORMAL LOW (ref 39.0–52.0)
Hemoglobin: 8.1 g/dL — ABNORMAL LOW (ref 13.0–17.0)
Immature Granulocytes: 2 %
Lymphocytes Relative: 6 %
Lymphs Abs: 1 10*3/uL (ref 0.7–4.0)
MCH: 27.8 pg (ref 26.0–34.0)
MCHC: 31.9 g/dL (ref 30.0–36.0)
MCV: 87.3 fL (ref 80.0–100.0)
Monocytes Absolute: 0.9 10*3/uL (ref 0.1–1.0)
Monocytes Relative: 5 %
Neutro Abs: 16.3 10*3/uL — ABNORMAL HIGH (ref 1.7–7.7)
Neutrophils Relative %: 87 %
Platelets: 226 10*3/uL (ref 150–400)
RBC: 2.91 MIL/uL — ABNORMAL LOW (ref 4.22–5.81)
RDW: 18.2 % — ABNORMAL HIGH (ref 11.5–15.5)
Smear Review: NORMAL
WBC: 18.5 10*3/uL — ABNORMAL HIGH (ref 4.0–10.5)
nRBC: 0 % (ref 0.0–0.2)

## 2021-12-28 LAB — BASIC METABOLIC PANEL
Anion gap: 11 (ref 5–15)
Anion gap: 13 (ref 5–15)
BUN: 67 mg/dL — ABNORMAL HIGH (ref 8–23)
BUN: 66 mg/dL — ABNORMAL HIGH (ref 8–23)
CO2: 14 mmol/L — ABNORMAL LOW (ref 22–32)
CO2: 15 mmol/L — ABNORMAL LOW (ref 22–32)
Calcium: 7.7 mg/dL — ABNORMAL LOW (ref 8.9–10.3)
Calcium: 7.7 mg/dL — ABNORMAL LOW (ref 8.9–10.3)
Chloride: 105 mmol/L (ref 98–111)
Chloride: 100 mmol/L (ref 98–111)
Creatinine, Ser: 3.86 mg/dL — ABNORMAL HIGH (ref 0.61–1.24)
Creatinine, Ser: 3.7 mg/dL — ABNORMAL HIGH (ref 0.61–1.24)
GFR, Estimated: 17 mL/min — ABNORMAL LOW (ref >60)
GFR, Estimated: 18 mL/min — ABNORMAL LOW (ref >60)
Glucose, Bld: 260 mg/dL — ABNORMAL HIGH (ref 70–99)
Glucose, Bld: 341 mg/dL — ABNORMAL HIGH (ref 70–99)
Potassium: 4.2 mmol/L (ref 3.5–5.1)
Potassium: 4 mmol/L (ref 3.5–5.1)
Sodium: 130 mmol/L — ABNORMAL LOW (ref 135–145)
Sodium: 128 mmol/L — ABNORMAL LOW (ref 135–145)

## 2021-12-28 LAB — LACTIC ACID, PLASMA
Lactic Acid, Venous: 2 mmol/L (ref 0.5–1.9)
Lactic Acid, Venous: 2.1 mmol/L (ref 0.5–1.9)
Lactic Acid, Venous: 2.2 mmol/L (ref 0.5–1.9)
Lactic Acid, Venous: 2.1 mmol/L (ref 0.5–1.9)

## 2021-12-28 LAB — URINE CULTURE: Culture: <10000 — AB

## 2021-12-28 LAB — PROCALCITONIN: Procalcitonin: 1.43 ng/mL

## 2021-12-28 LAB — GLUCOSE, CAPILLARY
Glucose-Capillary: 236 mg/dL — ABNORMAL HIGH (ref 70–99)
Glucose-Capillary: 234 mg/dL — ABNORMAL HIGH (ref 70–99)

## 2021-12-28 LAB — MAGNESIUM: Magnesium: 1.7 mg/dL (ref 1.7–2.4)

## 2021-12-28 LAB — PHOSPHORUS: Phosphorus: 4.5 mg/dL (ref 2.5–4.6)

## 2021-12-28 LAB — MRSA NEXT GEN BY PCR, NASAL: MRSA by PCR Next Gen: DETECTED — AB

## 2021-12-28 MED ORDER — ATORVASTATIN CALCIUM 80 MG PO TABS
80.0000 mg | ORAL_TABLET | Freq: Every day | ORAL | Status: DC
  Administered 2021-12-28 – 2022-01-03 (×4): 80 mg via ORAL
  Filled 2021-12-28 (×2): qty 4
  Filled 2021-12-28: qty 1
  Filled 2021-12-28: qty 4
  Filled 2021-12-28 (×3): qty 1
  Filled 2021-12-28: qty 4

## 2021-12-28 MED ORDER — SODIUM CHLORIDE 0.9 % IV BOLUS
1000.0000 mL | Freq: Once | INTRAVENOUS | Status: AC
  Administered 2021-12-28: 1000 mL via INTRAVENOUS

## 2021-12-28 MED ORDER — INSULIN ASPART 100 UNIT/ML IJ SOLN: 0.0000 [IU] | Freq: Every day | INTRAMUSCULAR | Status: DC

## 2021-12-28 MED ORDER — MAGNESIUM SULFATE 2 GM/50ML IV SOLN
2.0000 g | Freq: Once | INTRAVENOUS | Status: AC
  Administered 2021-12-28: 2 g via INTRAVENOUS
  Filled 2021-12-28: qty 50

## 2021-12-28 MED ORDER — SODIUM BICARBONATE 8.4 % IV SOLN
50.0000 meq | Freq: Once | INTRAVENOUS | Status: AC
Start: 2021-12-28 — End: 2021-12-28
  Administered 2021-12-28: 50 meq via INTRAVENOUS
  Filled 2021-12-28: qty 50

## 2021-12-28 MED ORDER — INSULIN ASPART 100 UNIT/ML IJ SOLN
0.0000 [IU] | Freq: Three times a day (TID) | INTRAMUSCULAR | Status: DC
  Administered 2021-12-28: 3 [IU] via SUBCUTANEOUS
  Administered 2021-12-28: 7 [IU] via SUBCUTANEOUS
  Filled 2021-12-28 (×2): qty 1

## 2021-12-28 MED ORDER — SODIUM CHLORIDE 0.9 % IV BOLUS
250.0000 mL | Freq: Once | INTRAVENOUS | Status: AC
  Administered 2021-12-28: 250 mL via INTRAVENOUS

## 2021-12-28 MED ORDER — CHLORHEXIDINE GLUCONATE CLOTH 2 % EX PADS
6.0000 | MEDICATED_PAD | Freq: Every day | CUTANEOUS | Status: DC
  Administered 2021-12-28 – 2022-01-11 (×14): 6 via TOPICAL

## 2021-12-28 MED ORDER — METHYLPREDNISOLONE SODIUM SUCC 40 MG IJ SOLR
40.0000 mg | INTRAMUSCULAR | Status: DC
  Administered 2021-12-28: 40 mg via INTRAVENOUS
  Filled 2021-12-28: qty 1

## 2021-12-28 MED ORDER — ASPIRIN EC 81 MG PO TBEC
81.0000 mg | DELAYED_RELEASE_TABLET | Freq: Every day | ORAL | Status: DC
  Administered 2021-12-28 – 2022-01-02 (×6): 81 mg via ORAL
  Filled 2021-12-28 (×6): qty 1

## 2021-12-28 MED ORDER — INSULIN ASPART 100 UNIT/ML IJ SOLN
0.0000 [IU] | Freq: Three times a day (TID) | INTRAMUSCULAR | Status: DC
  Administered 2021-12-28: 3 [IU] via SUBCUTANEOUS
  Filled 2021-12-28: qty 1

## 2021-12-28 NOTE — Progress Notes (Signed)
[facility]  ROUNDING NOTE   Subjective:   Patient seen resting comfortably Complains of poor appetite this a.m. Complains of mild shortness of breath but states this is normal Does report restless night  Renal function slightly improved with creatinine 3.7. No urine output recorded Bicarb slowly improving at 15  Objective:  Vital signs in last 24 hours:  Temp:  [98.3 F (36.8 C)-98.4 F (36.9 C)] 98.4 F (36.9 C) (02/23 0554) Pulse Rate:  [25-119] 81 (02/23 1200) Resp:  [13-29] 23 (02/23 1330) BP: (68-133)/(47-95) 133/87 (02/23 1200) SpO2:  [79 %-100 %] 98 % (02/23 1200)  Weight change:  Filed Weights   12/26/21 2013  Weight: 72.6 kg    Intake/Output: I/O last 3 completed shifts: In: 1954.7 [I.V.:855.7; IV Piggyback:1099] Out: 1800 [WUGQB:1694]   Intake/Output this shift:  No intake/output data recorded.  Physical Exam: General: NAD, ill-appearing  Head: Normocephalic, atraumatic. Moist oral mucosal membranes  Eyes: Anicteric  Lungs:  Clear to auscultation, normal effort  Heart: Regular rate and rhythm  Abdomen:  Soft, nontender, obese  Extremities: No peripheral edema.  Neurologic: Nonfocal, moving all four extremities  Skin: No lesions  GU Foley catheter    Basic Metabolic Panel: Recent Labs  Lab 12/26/21 2020 12/27/21 0532 12/28/21 0500 12/28/21 1101  NA 131* 132* 130* 128*  K 4.0 4.2 4.2 4.0  CL 100 105 105 100  CO2 18* 17* 14* 15*  GLUCOSE 156* 138* 260* 341*  BUN 65* 64* 67* 66*  CREATININE 3.63* 3.82* 3.86* 3.70*  CALCIUM 8.7* 8.0* 7.7* 7.7*  MG  --   --  1.7  --   PHOS  --   --  4.5  --     Liver Function Tests: Recent Labs  Lab 12/26/21 2020 12/27/21 0532  AST 31 28  ALT 55* 44  ALKPHOS 204* 167*  BILITOT 2.1* 1.5*  PROT 8.1 6.8  ALBUMIN 2.7* 2.2*   No results for input(s): LIPASE, AMYLASE in the last 168 hours. No results for input(s): AMMONIA in the last 168 hours.  CBC: Recent Labs  Lab 12/26/21 2020  12/27/21 0532 12/28/21 0500  WBC 19.2* 14.4* 18.5*  NEUTROABS  --   --  16.3*  HGB 10.2* 8.3* 8.1*  HCT 32.2* 25.6* 25.4*  MCV 89.2 89.2 87.3  PLT 207 156 226    Cardiac Enzymes: Recent Labs  Lab 12/26/21 2020  CKTOTAL 10*    BNP: Invalid input(s): POCBNP  CBG: No results for input(s): GLUCAP in the last 168 hours.  Microbiology: Results for orders placed or performed during the hospital encounter of 12/26/21  Resp Panel by RT-PCR (Flu A&B, Covid)     Status: Abnormal   Collection Time: 12/26/21 10:33 PM   Specimen: Nasopharyngeal(NP) swabs in vial transport medium  Result Value Ref Range Status   SARS Coronavirus 2 by RT PCR POSITIVE (A) NEGATIVE Final    Comment: (NOTE) SARS-CoV-2 target nucleic acids are DETECTED.  The SARS-CoV-2 RNA is generally detectable in upper respiratory specimens during the acute phase of infection. Positive results are indicative of the presence of the identified virus, but do not rule out bacterial infection or co-infection with other pathogens not detected by the test. Clinical correlation with patient history and other diagnostic information is necessary to determine patient infection status. The expected result is Negative.  Fact Sheet for Patients: EntrepreneurPulse.com.au  Fact Sheet for Healthcare Providers: IncredibleEmployment.be  This test is not yet approved or cleared by the [place] FDA  and  has been authorized for detection and/or diagnosis of SARS-CoV-2 by FDA under an Emergency Use Authorization (EUA).  This EUA will remain in effect (meaning this test can be used) for the duration of  the COVID-19 declaration under Section 564(b)(1) of the A ct, 21 U.S.C. section 360bbb-3(b)(1), unless the authorization is terminated or revoked sooner.     Influenza A by PCR NEGATIVE NEGATIVE Final   Influenza B by PCR NEGATIVE NEGATIVE Final    Comment: (NOTE) The Xpert Xpress  SARS-CoV-2/FLU/RSV plus assay is intended as an aid in the diagnosis of influenza from Nasopharyngeal swab specimens and should not be used as a sole basis for treatment. Nasal washings and aspirates are unacceptable for Xpert Xpress SARS-CoV-2/FLU/RSV testing.  Fact Sheet for Patients: EntrepreneurPulse.com.au  Fact Sheet for Healthcare Providers: IncredibleEmployment.be  This test is not yet approved or cleared by the [place] FDA and has been authorized for detection and/or diagnosis of SARS-CoV-2 by FDA under an Emergency Use Authorization (EUA). This EUA will remain in effect (meaning this test can be used) for the duration of the COVID-19 declaration under Section 564(b)(1) of the Act, 21 U.S.C. section 360bbb-3(b)(1), unless the authorization is terminated or revoked.  Performed at [facility], [address] [place], [place] [address]   Blood culture (routine x 2)     Status: None (Preliminary result)   Collection Time: 12/26/21 10:33 PM   Specimen: BLOOD  Result Value Ref Range Status   Specimen Description BLOOD LEFT ASSIST CONTROL  Final   Special Requests   Final    BOTTLES DRAWN AEROBIC AND ANAEROBIC Blood Culture adequate volume   Culture   Final    NO GROWTH 2 DAYS Performed at [facility], [address] [place], [place] [address]    Report Status PENDING  Incomplete  Blood culture (routine x 2)     Status: Abnormal (Preliminary result)   Collection Time: 12/26/21 10:46 PM   Specimen: BLOOD  Result Value Ref Range Status   Specimen Description   Final    BLOOD RIGHT ANTECUBITAL Performed at [facility], [place] [address] [place], [place] [address]    Special Requests   Final    BOTTLES DRAWN AEROBIC AND ANAEROBIC Blood Culture adequate volume Performed at [facility], [address] [place], [place] [address]    Culture  Setup Time   Final    GRAM NEGATIVE RODS ANAEROBIC  BOTTLE ONLY Organism ID to follow CRITICAL RESULT CALLED TO, READ BACK BY AND VERIFIED WITH: Lu Duffel PHARMD 1126 12/27/21 HNM    Culture (A)  Final    ESCHERICHIA COLI SUSCEPTIBILITIES TO FOLLOW Performed at [facility], [place] [address], [place], [place] [address]    Report Status PENDING  Incomplete  Blood Culture ID Panel (Reflexed)     Status: Abnormal   Collection Time: 12/26/21 10:46 PM  Result Value Ref Range Status   Enterococcus faecalis NOT DETECTED NOT DETECTED Final   Enterococcus Faecium NOT DETECTED NOT DETECTED Final   Listeria monocytogenes NOT DETECTED NOT DETECTED Final   Staphylococcus species NOT DETECTED NOT DETECTED Final   Staphylococcus aureus (BCID) NOT DETECTED NOT DETECTED Final   Staphylococcus epidermidis NOT DETECTED NOT DETECTED Final   Staphylococcus lugdunensis NOT DETECTED NOT DETECTED Final   Streptococcus species NOT DETECTED NOT DETECTED Final   Streptococcus agalactiae NOT DETECTED NOT DETECTED Final   Streptococcus pneumoniae NOT DETECTED NOT DETECTED Final   Streptococcus pyogenes  NOT DETECTED NOT DETECTED Final   A.calcoaceticus-baumannii NOT DETECTED NOT DETECTED Final   Bacteroides fragilis NOT DETECTED NOT DETECTED Final   Enterobacterales DETECTED (A) NOT DETECTED Final    Comment: Enterobacterales represent a large order of gram negative bacteria, not a single organism. CRITICAL RESULT CALLED TO, READ BACK BY AND VERIFIED WITH: DEVAN MITCHELL PHARMD 1126 12/27/21 HNM    Enterobacter cloacae complex NOT DETECTED NOT DETECTED Final   Escherichia coli DETECTED (A) NOT DETECTED Final    Comment: CRITICAL RESULT CALLED TO, READ BACK BY AND VERIFIED WITH: DEVAN MITCHELL RWERXV [id number] 12/27/21 HNM    Klebsiella aerogenes NOT DETECTED NOT DETECTED Final   Klebsiella oxytoca NOT DETECTED NOT DETECTED Final   Klebsiella pneumoniae NOT DETECTED NOT DETECTED Final   Proteus species NOT DETECTED NOT DETECTED Final   Salmonella species  NOT DETECTED NOT DETECTED Final   Serratia marcescens NOT DETECTED NOT DETECTED Final   Haemophilus influenzae NOT DETECTED NOT DETECTED Final   Neisseria meningitidis NOT DETECTED NOT DETECTED Final   Pseudomonas aeruginosa NOT DETECTED NOT DETECTED Final   Stenotrophomonas maltophilia NOT DETECTED NOT DETECTED Final   Candida albicans NOT DETECTED NOT DETECTED Final   Candida auris NOT DETECTED NOT DETECTED Final   Candida glabrata NOT DETECTED NOT DETECTED Final   Candida krusei NOT DETECTED NOT DETECTED Final   Candida parapsilosis NOT DETECTED NOT DETECTED Final   Candida tropicalis NOT DETECTED NOT DETECTED Final   Cryptococcus neoformans/gattii NOT DETECTED NOT DETECTED Final   CTX-M ESBL DETECTED (A) NOT DETECTED Final    Comment: CRITICAL RESULT CALLED TO, READ BACK BY AND VERIFIED WITH: DEVAN MITCHELL PHARMD 1126 12/27/21 HNM (NOTE) Extended spectrum beta-lactamase detected. Recommend a carbapenem as initial therapy.      Carbapenem resistance IMP NOT DETECTED NOT DETECTED Final   Carbapenem resistance KPC NOT DETECTED NOT DETECTED Final   Carbapenem resistance NDM NOT DETECTED NOT DETECTED Final   Carbapenem resist OXA 48 LIKE NOT DETECTED NOT DETECTED Final   Carbapenem resistance VIM NOT DETECTED NOT DETECTED Final    Comment: Performed at [facility], [address] [place], [place] [address]    Coagulation Studies: No results for input(s): LABPROT, INR in the last 72 hours.  Urinalysis: Recent Labs    12/26/21 2020  COLORURINE AMBER*  LABSPEC 1.010  PHURINE 6.0  GLUCOSEU NEGATIVE  HGBUR MODERATE*  BILIRUBINUR NEGATIVE  KETONESUR NEGATIVE  PROTEINUR 100*  NITRITE NEGATIVE  LEUKOCYTESUR LARGE*      Imaging: CT ABDOMEN PELVIS WO CONTRAST  Addendum Date: 12/26/2021   ADDENDUM REPORT: 12/26/2021 23:44 ADDENDUM: It should be noted that the air seen within the wall of the urinary bladder may represent sequelae associated with the previously  described malpositioned right-sided endo ureteral stent. Electronically Signed   By: Virgina Norfolk M.D.   On: 12/26/2021 23:44   Result Date: 12/26/2021 CLINICAL DATA:  Patient with altered mentation and recent history of COVID positive. EXAM: CT ABDOMEN AND PELVIS WITHOUT CONTRAST TECHNIQUE: Multidetector CT imaging of the abdomen and pelvis was performed following the standard protocol without IV contrast. RADIATION DOSE REDUCTION: This exam was performed according to the departmental dose-optimization program which includes automated exposure control, adjustment of the mA and/or kV according to patient size and/or use of iterative reconstruction technique. COMPARISON:  July 14, 2019 FINDINGS: Lower chest: No acute abnormality. Hepatobiliary: No focal liver abnormality is seen. No gallstones, gallbladder wall thickening, or biliary dilatation. Pancreas: Unremarkable. No pancreatic ductal dilatation or surrounding  inflammatory changes. Spleen: There is mild to moderate severity splenomegaly. Adrenals/Urinary Tract: Adrenal glands are unremarkable. Kidneys are normal in size, without focal lesions. Bilateral endo ureteral stents are noted. The proximal portion of the right-sided endo ureteral stent is seen inferior to the right renal pelvis (coronal reformatted images 46 through 50, CT series 5), while the left-sided endo ureteral stent is properly positioned. Ill-defined subcentimeter renal calculi are seen within the right renal pelvis which is mildly dilated. Numerous subcentimeter non-obstructing renal calculi are seen within the left kidney. There is marked severity diffuse urinary bladder wall thickening with air suspected within the lateral aspect of the bladder wall on the right. A Foley catheter is also present within the bladder lumen. Stomach/Bowel: Stomach is within normal limits. Appendix appears normal. No evidence of bowel wall thickening, distention, or inflammatory changes.  Vascular/Lymphatic: Aortic atherosclerosis. No enlarged abdominal or pelvic lymph nodes. Reproductive: The prostate gland is remarkable. Other: No abdominal wall hernia or abnormality. No abdominopelvic ascites. Musculoskeletal: Multilevel degenerative changes seen throughout the lumbar spine. IMPRESSION: 1. Bilateral endo ureteral stents, with a malpositioned right-sided endo ureteral stent, as described above. Surgical an urology consultation is recommended. 2. Marked severity diffuse urinary bladder wall thickening with air suspected within the lateral aspect of the bladder wall on the right which may represent sequelae associated with cystitis. The presence of an underlying neoplastic process cannot be excluded. 3. Renal calculi within the right renal pelvis with numerous subcentimeter non-obstructing left renal calculi. 4. Splenomegaly. 5. Aortic atherosclerosis. Aortic Atherosclerosis (ICD10-I70.0). Electronically Signed: By: Virgina Norfolk M.D. On: 12/26/2021 23:40   DG Chest Portable 1 View  Result Date: 12/26/2021 CLINICAL DATA:  New atrial fibrillation. EXAM: PORTABLE CHEST 1 VIEW COMPARISON:  Chest radiograph 10/14/2019 FINDINGS: Stable heart size allowing for differences in technique. Unchanged mediastinal contours. Aortic atherosclerosis and coronary stent. Mild hyperinflation and bronchial thickening, chronic. No acute airspace disease. No pulmonary edema. No pleural effusion or pneumothorax no acute osseous findings. IMPRESSION: 1. No acute chest findings. 2. Mild chronic hyperinflation and bronchial thickening. 3. Aortic atherosclerosis and coronary stent. Electronically Signed   By: Keith Rake M.D.   On: 12/26/2021 20:46     Medications:    sodium chloride     sodium chloride Stopped (12/27/21 1907)   sodium chloride     amiodarone 30 mg/hr (12/28/21 0628)   meropenem (MERREM) IV Stopped (12/28/21 1210)   phenylephrine (NEO-SYNEPHRINE) Adult infusion 65 mcg/min (12/28/21 1019)    sodium bicarbonate 150 mEq in D5W infusion 100 mL/hr at 12/28/21 1022    aspirin EC  81 mg Oral Daily   atorvastatin  80 mg Oral Daily   heparin  5,000 Units Subcutaneous Q12H   insulin aspart  0-5 Units Subcutaneous QHS   insulin aspart  0-9 Units Subcutaneous TID WC   levalbuterol  2 puff Inhalation Q8H   methylPREDNISolone (SOLU-MEDROL) injection  40 mg Intravenous Q24H   midodrine  10 mg Oral TID WC   pantoprazole (PROTONIX) IV  40 mg Intravenous Q12H   sodium chloride flush  3 mL Intravenous Q12H   sodium chloride flush  3 mL Intravenous Q12H   tamsulosin  0.4 mg Oral Daily   tiotropium  1 capsule Inhalation Daily   sodium chloride, acetaminophen **OR** acetaminophen, ondansetron **OR** ondansetron (ZOFRAN) IV, sodium chloride flush  Assessment/ Plan:  Mr. Wayne Burns is a 66 y.o.  male with past medical conditions including COPD and hypertension , who was admitted to [facility] on 12/26/2021 for  Sepsis ([place]) [A41.9] Septic shock ([place]) [A41.9, R65.21]   Acute kidney injury with chronic kidney disease stage IV.  Baseline appears to be 2.8 with GFR 25 on 12/22/2021.  AKI may be due to possible obstruction from migrating stent recently placed at [facility].    Normal saline infusion changed to sodium bicarb due to acidosis.  Slight improvement with renal function.  Urology suggesting conservative management at this time.  If surgical intervention required, patient will require cardiology clearance.  Recommend Foley catheter remain in place due to retention.  Lab Results  Component Value Date   CREATININE 3.70 (H) 12/28/2021   CREATININE 3.86 (H) 12/28/2021   CREATININE 3.82 (H) 12/27/2021    Intake/Output Summary (Last 24 hours) at 12/28/2021 1354 Last data filed at 12/27/2021 1907 Gross per 24 hour  Intake 955.69 ml  Output --  Net 955.69 ml   2.  Hypotension.  Currently on amiodarone drip with midodrine schedule III times daily.  Home regimen of amlodipine currently held  Blood pressure  improved today, 121/74.  3.  Acute metabolic acidosis.  Currently receiving IV fluids as stated above.   LOS: 1 Rebeca Valdivia 2/23/20231:54 PM

## 2021-12-28 NOTE — ED Notes (Signed)
Called pharmacy for replacement bags of sodium bicarb and phenylphrine

## 2021-12-28 NOTE — Progress Notes (Signed)
Neuro: alert and oriented x4 Resp: room air, dyspnea on exertion, dry congested cough  Cardio: Afib on amio gtt at maintenance GI/GU: foley with straw/ yellow output Skin: dry skin, surgical scars right ankle  Psych: anxious and cooperative Events: family updated by patient with this RN present

## 2021-12-28 NOTE — ED Notes (Signed)
RN attempted to place pt pulse on his finger. Pt sts " I dont want that on me. If you put it on I will take it off." RN advised pt that this needed to be on since pt is being monitored very close due to his illness and current medications that are being put in his IV's. Pt declined the want to comply.

## 2021-12-28 NOTE — ED Notes (Signed)
RN waiting to take pt to ICU until d/c of IVC.

## 2021-12-28 NOTE — Progress Notes (Signed)
[name]       Patient ID: Wayne Burns MRN: [id number] DOB/AGE: 64-20-1957 64 y.o.  Admit date: 12/26/2021 Referring Physician Dr. Shelly Coss  Primary Physician Dr. Earlie Counts [facility]  Primary Cardiologist  Reason for Consultation AF RVR  HPI: The patient is a 66 year old male with a past medical history notable for hypertension, COPD who was brought in by EMS under IVC by his family members for not being able to take care of himself.  He was apparently living in very poor conditions, refusing to go to the hospital and was deemed a danger to himself. He was hospitalized at [facility] for 10 days and discharged on 2/17 after bilateral ureteral stent placements and cardiology f/u was recommended for discussion of complex PCI placement as there was concern for inferior STE in the setting of CAD, sepsis, renal and respiratory failure. On arrival to [facility] ED 2/21 patient met sepsis criteria and in atrial fibrillation with RVR.  Cardiology is consulted for further assistance of his A-fib.  Interval History:  -feels okay, no chest pain. Admits to some SOB that is chronic in nature. Thankful for his care here at [facility].  -reported two episodes of melena on Saturday 2/18, no further BM since then but Hgb is low at 8.1 today.  -remains in AF but rate controlled with amio gtt. On neo and midodrine.   Review of systems complete and found to be negative unless listed above     Past Medical History:  Diagnosis Date   COPD (chronic obstructive pulmonary disease) ([place])    Hypertension     Past Surgical History:  Procedure Laterality Date   ANKLE SURGERY      (Not in a hospital admission)  Social History   Socioeconomic History   Marital status: Single    Spouse name: Not on file   Number of children: Not on file   Years of education: Not on file   Highest education level: Not on file  Occupational History   Not on file  Tobacco Use   Smoking status: Former     Packs/day: 0.50    Types: Cigarettes   Smokeless tobacco: Never  Vaping Use   Vaping Use: Never used  Substance and Sexual Activity   Alcohol use: Not Currently    Comment: often   Drug use: Not Currently   Sexual activity: Not on file  Other Topics Concern   Not on file  Social History Narrative   Not on file   Social Determinants of Health   Financial Resource Strain: Not on file  Food Insecurity: Not on file  Transportation Needs: Not on file  Physical Activity: Not on file  Stress: Not on file  Social Connections: Not on file  Intimate Partner Violence: Not on file    History reviewed. No pertinent family history.    Review of systems complete and found to be negative unless listed above    PHYSICAL EXAM General: Disheveled and ill-appearing Caucasian male, in no acute distress.  Sitting upright in ED stretcher in hospital gown.  HEENT:  Normocephalic and atraumatic. Neck:  No JVD.  Lungs: Normal respiratory effort on room air. No crackles. Heart: Irregularly irregular rhythm with controlled rate. Normal S1 and S2 without gallops or murmurs. Radial & DP pulses 2+ bilaterally. Abdomen: Obese appearing appearing.  Msk: Normal strength and tone for age. Extremities: Warm and well perfused. No clubbing, cyanosis.  Right forearm with ecchymosis. no lower extremity edema. Neuro: Alert  and oriented X 3. Psych:  Answers questions appropriately.   Labs:   Lab Results  Component Value Date   WBC 18.5 (H) 12/28/2021   HGB 8.1 (L) 12/28/2021   HCT 25.4 (L) 12/28/2021   MCV 87.3 12/28/2021   PLT 226 12/28/2021    Recent Labs  Lab 12/27/21 0532 12/28/21 0500  NA 132* 130*  K 4.2 4.2  CL 105 105  CO2 17* 14*  BUN 64* 67*  CREATININE 3.82* 3.86*  CALCIUM 8.0* 7.7*  PROT 6.8  --   BILITOT 1.5*  --   ALKPHOS 167*  --   ALT 44  --   AST 28  --   GLUCOSE 138* 260*    Lab Results  Component Value Date   CKTOTAL 10 (L) 12/26/2021    No results found for:  CHOL No results found for: HDL No results found for: LDLCALC No results found for: TRIG No results found for: CHOLHDL No results found for: LDLDIRECT    Radiology: CT ABDOMEN PELVIS WO CONTRAST  Addendum Date: 12/26/2021   ADDENDUM REPORT: 12/26/2021 23:44 ADDENDUM: It should be noted that the air seen within the wall of the urinary bladder may represent sequelae associated with the previously described malpositioned right-sided endo ureteral stent. Electronically Signed   By: Virgina Norfolk M.D.   On: 12/26/2021 23:44   Result Date: 12/26/2021 CLINICAL DATA:  Patient with altered mentation and recent history of COVID positive. EXAM: CT ABDOMEN AND PELVIS WITHOUT CONTRAST TECHNIQUE: Multidetector CT imaging of the abdomen and pelvis was performed following the standard protocol without IV contrast. RADIATION DOSE REDUCTION: This exam was performed according to the departmental dose-optimization program which includes automated exposure control, adjustment of the mA and/or kV according to patient size and/or use of iterative reconstruction technique. COMPARISON:  July 14, 2019 FINDINGS: Lower chest: No acute abnormality. Hepatobiliary: No focal liver abnormality is seen. No gallstones, gallbladder wall thickening, or biliary dilatation. Pancreas: Unremarkable. No pancreatic ductal dilatation or surrounding inflammatory changes. Spleen: There is mild to moderate severity splenomegaly. Adrenals/Urinary Tract: Adrenal glands are unremarkable. Kidneys are normal in size, without focal lesions. Bilateral endo ureteral stents are noted. The proximal portion of the right-sided endo ureteral stent is seen inferior to the right renal pelvis (coronal reformatted images 46 through 50, CT series 5), while the left-sided endo ureteral stent is properly positioned. Ill-defined subcentimeter renal calculi are seen within the right renal pelvis which is mildly dilated. Numerous subcentimeter non-obstructing renal  calculi are seen within the left kidney. There is marked severity diffuse urinary bladder wall thickening with air suspected within the lateral aspect of the bladder wall on the right. A Foley catheter is also present within the bladder lumen. Stomach/Bowel: Stomach is within normal limits. Appendix appears normal. No evidence of bowel wall thickening, distention, or inflammatory changes. Vascular/Lymphatic: Aortic atherosclerosis. No enlarged abdominal or pelvic lymph nodes. Reproductive: The prostate gland is remarkable. Other: No abdominal wall hernia or abnormality. No abdominopelvic ascites. Musculoskeletal: Multilevel degenerative changes seen throughout the lumbar spine. IMPRESSION: 1. Bilateral endo ureteral stents, with a malpositioned right-sided endo ureteral stent, as described above. Surgical an urology consultation is recommended. 2. Marked severity diffuse urinary bladder wall thickening with air suspected within the lateral aspect of the bladder wall on the right which may represent sequelae associated with cystitis. The presence of an underlying neoplastic process cannot be excluded. 3. Renal calculi within the right renal pelvis with numerous subcentimeter non-obstructing left renal calculi. 4. Splenomegaly.  5. Aortic atherosclerosis. Aortic Atherosclerosis (ICD10-I70.0). Electronically Signed: By: Virgina Norfolk M.D. On: 12/26/2021 23:40   DG Chest Portable 1 View  Result Date: 12/26/2021 CLINICAL DATA:  New atrial fibrillation. EXAM: PORTABLE CHEST 1 VIEW COMPARISON:  Chest radiograph 10/14/2019 FINDINGS: Stable heart size allowing for differences in technique. Unchanged mediastinal contours. Aortic atherosclerosis and coronary stent. Mild hyperinflation and bronchial thickening, chronic. No acute airspace disease. No pulmonary edema. No pleural effusion or pneumothorax no acute osseous findings. IMPRESSION: 1. No acute chest findings. 2. Mild chronic hyperinflation and bronchial  thickening. 3. Aortic atherosclerosis and coronary stent. Electronically Signed   By: Keith Rake M.D.   On: 12/26/2021 20:46    ECHO 12/12/21 Summary    1. An ultrasound enhancing agent was used to improve the visualization of  the left ventricular cavity and endocardial borders.    2. Technically difficult study.    3. The left ventricle is upper normal in size with normal wall thickness.    4. The left ventricular systolic function is severely decreased, LVEF is  visually estimated at 25-30%.    5. There is thinning and akinesis involving the mid inferior and basal  inferior segment(s).    6. There is grade III diastolic dysfunction (severely elevated filling  pressure).    7. The right ventricle is normal in size, with low normal systolic function.   TELEMETRY reviewed by me: Atrial fibrillation with rate peaking in the 140s, between 110-125 during interview with occasional PVCs  EKG reviewed by me: Atrial fibrillation rate 121, old inferior infarct  Cardiac cath at [facility] 12/13/21 Coronary Angiography   Dominance: Right   Left Coronary:    - Left Main:  Large caliber vessel that bifurcates into the LAD and LCx.  There is 25% distal stenosis.     LAD:  - Heavily calcified, large caliber vessel that is subtotally occluded at  the early-mid vessel just after the take-off of the first diagonal. D1 is  a moderate-large caliber and provides L>L collaterals to the OM territory.  D1 has moderate proximal and early mid-vessel disease. The first septal  provides L>R collaterals to the PLA and PLV.   LCx:  - Moderate caliber vessel that is chronically occluded in the proximal  vessel. OM1 is long and of small caliber, without significant disease. A  mid/distal portion of a high OM-2 vessel fills via L>L collaterals. The  mid/distal OM appears to have significant disease.   Right Coronary:    - Dominant vessel that is chronically totally occluded at the proximal  vessel. The  proximal vessel The distal vessel/PDA/PLV fills with L>R  collaterals.   Complications:  None  Blood loss:  Minimal  Specimen:  None  Device/Implants:  NA   Pre-procedure Dx:  STEMI  Post-procedure Dx:  CAD   ASSESSMENT AND PLAN:  The patient is a 66 year old male with a past medical history notable for CAD, HFrEF (LVEF 25-30%), hypertension, COPD who was brought in by EMS under IVC by his family members for not being able to take care of himself.  He was apparently living in very poor conditions, refusing to go to the hospital and was deemed a danger to himself. He was hospitalized at [facility] for 10 days and discharged on 2/17 after bilateral ureteral stent placements and cardiology f/u was recommended for discussion of complex PCI placement as there was concern for inferior STE in the setting of CAD, sepsis, renal and respiratory failure. On arrival to [facility] ED  2/21 patient met sepsis criteria and in atrial fibrillation with RVR.  Cardiology is consulted for further assistance of his A-fib.  #New onset atrial fibrillation with RVR #Urosepsis & ESBL ecoli bacteremia  #septic shock #COVID-19, asymptomatic Patient presents to [facility] ED after a 10-day hospitalization at [facility] for treatment of kidney stones. He presents to [facility] ED 4 days after discharge "feeling horrible" and met sepsis criteria on admission.  He was also in A-fib with rates peaking in the 140s, blood pressure too soft to start diltiazem or metoprolol.  His troponin is minimally elevated to 67-70 thought to be 2/2 demand ischemia from sepsis and his tachycardia.   -Agree with treatment of bacteremia and urosepsis as per primary team -on neo and midodrine for hypotension, wean as tolerated per PCCM -Defer heparin d/t low Hgb is down to 8.1 today. Will need OK from GI before starting anticoagulation.  -s/p Amio load. Continue amio gtt for now.  -prior echo from 2/8 now available with results above  -CHA2DS2-VASc is ~3 (after further  records available)   #anemia -current Hgb is 8.1, review of labs from [facility] that are now available for review show that patient needed multiple units of PRBCs and EGD 2/16 showed evidence of prior gastric ulcers but no acute bleeding -defer anticoagulation for now as his Hgb remains low and pt reported 2 episodes of melena on 2/18.  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  remains chest pain-free.   -results from cardiac cath performed on 12/13/20 at [facility] for code STEMI are now available in care everywhere -- it resulted with 25% distal LM disease, subtotally occluded mid-LAD at the stent level, chronically occluded mid-Lcx and occluded proximal RCA  --intervention deferred aggressive secondary prevention recommended  -continue 64m ASA daily -continue atorvastatin 861m -On amlodipine at home, holding due to hypotension.  #HFrEF (LVEF 25-30% 12/13/21)  -appears euvolemic from a HF standpoint -not on GDMT currently d/t hypotension -recommend salt and fluid restriction,   #Acute kidney injury Creatinine 3.82-3.7, EGFR 17-18, will continue to monitor Appreciate nephrology input  This patient's plan of care was discussed and created with Dr. DwLujean Amelnd he is in agreement.  Signed: LiTristan Schroeder PA-C 12/28/2021, 9:07 AM [facility]

## 2021-12-28 NOTE — ED Notes (Signed)
This RN to bedside after Caitlyn, EDT found pt standing in room with IV pump beeping- pt now sitting on bench next to bed, all IV's checked and running appropriately

## 2021-12-28 NOTE — ED Notes (Signed)
Meda Coffee NP at bedside

## 2021-12-28 NOTE — Consult Note (Signed)
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   GFR, Estimated 17 (L) >60 mL/min    Comment: (NOTE) Calculated using the CKD-EPI Creatinine Equation (2021)    Anion gap 11 5 - 15    Comment: Performed at [facility], [address] [place],  [address]  Procalcitonin     Status: None    Collection Time: 12/28/21  5:00 AM  Result Value Ref Range   Procalcitonin 1.43 ng/mL    Comment:        Interpretation: PCT > 0.5 ng/mL and <= 2 ng/mL: Systemic infection (sepsis) is possible, but other conditions are known to elevate PCT as well. (NOTE)       Sepsis PCT Algorithm           Lower Respiratory Tract  Infection PCT Algorithm    ----------------------------     ----------------------------         PCT < 0.25 ng/mL                PCT < 0.10 ng/mL          Strongly encourage             Strongly discourage   discontinuation of antibiotics    initiation of antibiotics    ----------------------------     -----------------------------       PCT 0.25 - 0.50 ng/mL            PCT 0.10 - 0.25 ng/mL               OR       >80% decrease in PCT            Discourage initiation of                                            antibiotics      Encourage discontinuation           of antibiotics    ----------------------------     -----------------------------         PCT >= 0.50 ng/mL              PCT 0.26 - 0.50 ng/mL                AND       <80% decrease in PCT             Encourage initiation of                                             antibiotics       Encourage continuation           of antibiotics    ----------------------------     -----------------------------        PCT >= 0.50 ng/mL                  PCT > 0.50 ng/mL               AND         increase in PCT                  Strongly encourage                                      initiation of antibiotics    Strongly encourage escalation           of antibiotics                                     -----------------------------                                           PCT <= 0.25 ng/mL  OR                                        > 80% decrease in PCT                                      Discontinue / Do not initiate                                              antibiotics  Performed at [facility], [address] [place], [place] [address]   Lactic acid, plasma     Status: Abnormal   Collection Time: 12/28/21  5:00 AM  Result Value Ref Range   Lactic Acid, Venous 2.0 (HH) 0.5 - 1.9 mmol/L    Comment: CRITICAL RESULT CALLED TO, READ BACK BY AND VERIFIED WITH Kathrynn Running RN [phone] 12/28/21 HNM Performed at [facility], [address], [place], [place] [address]   Phosphorus     Status: None   Collection Time: 12/28/21  5:00 AM  Result Value Ref Range   Phosphorus 4.5 2.5 - 4.6 mg/dL    Comment: Performed at [facility], [address] [place], [place] [address]  Magnesium     Status: None   Collection Time: 12/28/21  5:00 AM  Result Value Ref Range   Magnesium 1.7 1.7 - 2.4 mg/dL    Comment: Performed at [facility], [address] [place], [place] [address]  Lactic acid, plasma     Status: Abnormal   Collection Time: 12/28/21 11:00 AM  Result Value Ref Range   Lactic Acid, Venous 2.1 (HH) 0.5 - 1.9 mmol/L    Comment: CRITICAL VALUE NOTED. VALUE IS CONSISTENT WITH PREVIOUSLY REPORTED/CALLED VALUE DAS Performed at [facility], [address] [place], [place] [address]   Basic metabolic panel     Status: Abnormal   Collection Time: 12/28/21 11:01 AM  Result Value Ref Range   Sodium 128 (L) 135 - 145 mmol/L   Potassium 4.0 3.5 - 5.1 mmol/L   Chloride 100 98 - 111 mmol/L   CO2 15 (L) 22 - 32 mmol/L   Glucose, Bld 341 (H) 70 - 99 mg/dL    Comment: Glucose reference range applies only to samples taken after fasting for at least 8 hours.   BUN 66 (H) 8 - 23 mg/dL   Creatinine, Ser 3.70 (H) 0.61 - 1.24 mg/dL   Calcium 7.7 (L) 8.9 - 10.3 mg/dL   GFR, Estimated 18 (L) >60 mL/min    Comment: (NOTE) Calculated using the CKD-EPI Creatinine Equation (2021)    Anion gap 13 5 - 15    Comment: Performed at [facility], [address]  [place], [place] [address]    Current Facility-Administered Medications  Medication Dose Route Frequency Provider Last Rate Last Admin   0.9 %  sodium chloride infusion  250 mL Intravenous PRN Florina Ou V, MD       0.9 %  sodium chloride infusion  250 mL Intravenous Continuous Darel Hong D, NP       acetaminophen (TYLENOL) tablet 650 mg  650 mg Oral Q6H PRN Para Skeans, MD  650 mg at 12/27/21 6283   Or   acetaminophen (TYLENOL) suppository 650 mg  650 mg Rectal Q6H PRN Para Skeans, MD       amiodarone (NEXTERONE PREMIX) 360-4.14 MG/200ML-% (1.8 mg/mL) IV infusion  30 mg/hr Intravenous Continuous Tristan Schroeder, PA-C 16.67 mL/hr at 12/28/21 6629 30 mg/hr at 12/28/21 4765   aspirin EC tablet 81 mg  81 mg Oral Daily Tristan Schroeder, PA-C   81 mg at 12/28/21 1034   atorvastatin (LIPITOR) tablet 80 mg  80 mg Oral Daily Tristan Schroeder, PA-C   80 mg at 12/28/21 1033   heparin injection 5,000 Units  5,000 Units Subcutaneous Q12H Para Skeans, MD   5,000 Units at 12/28/21 1321   insulin aspart (novoLOG) injection 0-5 Units  0-5 Units Subcutaneous QHS Teressa Lower, NP       insulin aspart (novoLOG) injection 0-9 Units  0-9 Units Subcutaneous TID WC Teressa Lower, NP   7 Units at 12/28/21 1146   levalbuterol (XOPENEX HFA) inhaler 2 puff  2 puff Inhalation Q8H Adhikari, Tamsen Meek, MD   2 puff at 12/28/21 1331   meropenem (MERREM) 500 mg in sodium chloride 0.9 % 100 mL IVPB  500 mg Intravenous Q12H Berton Mount, RPH   Stopped at 12/28/21 1210   methylPREDNISolone sodium succinate (SOLU-MEDROL) 40 mg/mL injection 40 mg  40 mg Intravenous Q24H Teressa Lower, NP   40 mg at 12/28/21 1036   midodrine (PROAMATINE) tablet 10 mg  10 mg Oral TID WC Shelly Coss, MD   10 mg at 12/28/21 1321   ondansetron (ZOFRAN) tablet 4 mg  4 mg Oral Q6H PRN Para Skeans, MD       Or   ondansetron (ZOFRAN) injection 4 mg  4 mg Intravenous Q6H PRN Para Skeans, MD       pantoprazole (PROTONIX)  injection 40 mg  40 mg Intravenous Q12H Florina Ou V, MD   40 mg at 12/28/21 1040   phenylephrine (NEO-SYNEPHRINE) 20mg /NS 218mL premix infusion  25-200 mcg/min Intravenous Titrated Darel Hong D, NP 48.8 mL/hr at 12/28/21 1019 65 mcg/min at 12/28/21 1019   sodium bicarbonate 150 mEq in dextrose 5 % 1,150 mL infusion   Intravenous Continuous Flora Lipps, MD 100 mL/hr at 12/28/21 1022 New Bag at 12/28/21 1022   sodium chloride flush (NS) 0.9 % injection 3 mL  3 mL Intravenous Q12H Florina Ou V, MD   3 mL at 12/27/21 0932   sodium chloride flush (NS) 0.9 % injection 3 mL  3 mL Intravenous Q12H Florina Ou V, MD   3 mL at 12/27/21 0932   sodium chloride flush (NS) 0.9 % injection 3 mL  3 mL Intravenous PRN Para Skeans, MD       tamsulosin [facility]) capsule 0.4 mg  0.4 mg Oral Daily Florina Ou V, MD   0.4 mg at 12/28/21 1034   tiotropium (SPIRIVA) inhalation capsule ([facility] use ONLY) 18 mcg  1 capsule Inhalation Daily Para Skeans, MD       Current Outpatient Medications  Medication Sig Dispense Refill   amLODipine (NORVASC) 10 MG tablet Take 10 mg by mouth daily.     atorvastatin (LIPITOR) 80 MG tablet Take 80 mg by mouth daily.     cefdinir (OMNICEF) 300 MG capsule Take 300 mg by mouth once.     pantoprazole (PROTONIX) 40 MG tablet Take 40 mg by mouth 2 (two) times daily.  sertraline (ZOLOFT) 100 MG tablet Take 100 mg by mouth 2 (two) times daily.     sertraline (ZOLOFT) 50 MG tablet Take 50 mg by mouth daily.     tamsulosin (FLOMAX) 0.4 MG CAPS capsule Take 0.4 mg by mouth daily.     tiotropium (SPIRIVA HANDIHALER) 18 MCG inhalation capsule Place 1 capsule into inhaler and inhale daily.     traZODone (DESYREL) 50 MG tablet Take 100 mg by mouth at bedtime.      Musculoskeletal: Strength & Muscle Tone: within normal limits Gait & Station:  did not assess Patient leans: N/A   Psychiatric Specialty Exam:  Presentation  General Appearance: Appropriate for Environment  Eye  Contact:Good  Speech:Clear and Coherent  Speech Volume:Normal  Handedness:No data recorded  Mood and Affect  Mood:Anxious ("I want to get upstairs")  Affect:Appropriate   Thought Process  Thought Processes:Coherent  Descriptions of Associations:Intact  Orientation:Full (Time, Place and Person)  Thought Content:WDL  History of Schizophrenia/Schizoaffective disorder:No data recorded Duration of Psychotic Symptoms:No data recorded Hallucinations:Hallucinations: None  Ideas of Reference:None  Suicidal Thoughts:Suicidal Thoughts: No  Homicidal Thoughts:Homicidal Thoughts: No   Sensorium  Memory:Recent Good; Immediate Good  Judgment:Good  Insight:No data recorded  Executive Functions  Concentration:Good  Attention Span:Good  [place] of Knowledge:Good  Language:Good   Psychomotor Activity  Psychomotor Activity:Psychomotor Activity: Normal   Assets  Assets:Communication Skills; Desire for Improvement; Financial Resources/Insurance; Housing; Resilience; Social Support   Sleep  Sleep:Sleep: Fair   Physical Exam: Physical Exam Vitals and nursing note reviewed.  HENT:     Head: Normocephalic.     Nose: No congestion or rhinorrhea.  Eyes:     General:        Right eye: No discharge.        Left eye: No discharge.  Cardiovascular:     Rate and Rhythm: Normal rate.  Pulmonary:     Effort: Pulmonary effort is normal.  Musculoskeletal:        General: Normal range of motion.     Cervical back: Normal range of motion.  Neurological:     Mental Status: He is alert and oriented to person, place, and time.  Psychiatric:        Attention and Perception: Attention normal.        Mood and Affect: Mood normal.        Behavior: Behavior normal.        Thought Content: Thought content normal.        Cognition and Memory: Cognition normal.   Review of Systems  Genitourinary:        Being admitted for sepsis,    Psychiatric/Behavioral:   Positive for depression (listed on problem list. Denies). Negative for hallucinations, memory loss, substance abuse and suicidal ideas. The patient is nervous/anxious. The patient does not have insomnia.   Blood pressure 133/87, pulse 81, temperature 98.4 F (36.9 C), temperature source Oral, resp. rate (!) 23, height 5\' 9"  (1.753 m), weight 72.6 kg, SpO2 98 %. Body mass index is 23.63 kg/m.  Treatment Plan Summary: Plan patient being admitted to medical floor for treatment and does not require psychiatric inpatient hospitalization. He is clear, coherent, has capacity to make his own decisions at this time  Disposition: No evidence of imminent risk to self or others at present.   Supportive therapy provided about ongoing stressors.  Sherlon Handing, NP 12/28/2021 2:26 PM

## 2021-12-28 NOTE — Progress Notes (Signed)
Date of Admission:  12/26/2021     ID: Wayne Burns is a 66 y.o. male  Principal Problem:   Depression Active Problems:   Abnormal LFTs (liver function tests)   Essential hypertension   Sepsis (HCC)   Urinary tract infection   Involuntary commitment   AKI (acute kidney injury) (HCC)   Elevated troponin   Elevated glucose   Atrial fibrillation with RVR (HCC)   Ureteral stent displacement, initial encounter ([place])   COVID-19 virus infection   COPD with acute exacerbation (HCC)   Septic shock (HCC)    Subjective: Pt feels better Sitting on the end of bed and watching TV   Medications:   aspirin EC  81 mg Oral Daily   atorvastatin  80 mg Oral Daily   Chlorhexidine Gluconate Cloth  6 each Topical Daily   heparin  5,000 Units Subcutaneous Q12H   insulin aspart  0-5 Units Subcutaneous QHS   insulin aspart  0-9 Units Subcutaneous TID WC   levalbuterol  2 puff Inhalation Q8H   methylPREDNISolone (SOLU-MEDROL) injection  40 mg Intravenous Q24H   midodrine  10 mg Oral TID WC   pantoprazole (PROTONIX) IV  40 mg Intravenous Q12H   sodium chloride flush  3 mL Intravenous Q12H   sodium chloride flush  3 mL Intravenous Q12H   tamsulosin  0.4 mg Oral Daily   tiotropium  1 capsule Inhalation Daily    Objective: Vital signs in last 24 hours: Temp:  [97.4 F (36.3 C)-98.4 F (36.9 C)] 97.4 F (36.3 C) (02/23 1500) Pulse Rate:  [33-119] 81 (02/23 1500) Resp:  [15-29] 23 (02/23 1500) BP: (68-133)/(47-95) 122/73 (02/23 1500) SpO2:  [90 %-100 %] 98 % (02/23 1200)  PHYSICAL EXAM:  General: Alert, cooperative, no distress, appears stated age.  Head: Normocephalic, without obvious abnormality, atraumatic. Eyes: Conjunctivae clear, anicteric sclerae. Pupils are equal ENT Nares normal. No drainage or sinus tenderness. Lips, mucosa, and tongue normal. No Thrush Neck: Supple, symmetrical, no adenopathy, thyroid: non tender no carotid bruit and no JVD. Back: No CVA tenderness. Lungs:  Clear to auscultation bilaterally. No Wheezing or Rhonchi. No rales. Heart: irregular Abdomen: Soft, non-tender,not distended. Bowel sounds normal. No masses Extremities: atraumatic, no cyanosis. No edema. No clubbing Skin: No rashes or lesions. Or bruising Lymph: Cervical, supraclavicular normal. Neurologic: Grossly non-focal  Lab Results Recent Labs    12/27/21 0532 12/28/21 0500 12/28/21 1101  WBC 14.4* 18.5*  --   HGB 8.3* 8.1*  --   HCT 25.6* 25.4*  --   NA 132* 130* 128*  K 4.2 4.2 4.0  CL 105 105 100  CO2 17* 14* 15*  BUN 64* 67* 66*  CREATININE 3.82* 3.86* 3.70*   Liver Panel Recent Labs    12/26/21 2020 12/27/21 0532  PROT 8.1 6.8  ALBUMIN 2.7* 2.2*  AST 31 28  ALT 55* 44  ALKPHOS 204* 167*  BILITOT 2.1* 1.5*   Sedimentation Rate No results for input(s): ESRSEDRATE in the last 72 hours. C-Reactive Protein No results for input(s): CRP in the last 72 hours.  Microbiology: 12/26/21 ESBL e.coli bacteremia Studies/Results: CT ABDOMEN PELVIS WO CONTRAST  Addendum Date: 12/26/2021   ADDENDUM REPORT: 12/26/2021 23:44 ADDENDUM: It should be noted that the air seen within the wall of the urinary bladder may represent sequelae associated with the previously described malpositioned right-sided endo ureteral stent. Electronically Signed   By: Virgina Norfolk M.D.   On: 12/26/2021 23:44   Result Date: 12/26/2021 CLINICAL DATA:  Patient with altered mentation and recent history of COVID positive. EXAM: CT ABDOMEN AND PELVIS WITHOUT CONTRAST TECHNIQUE: Multidetector CT imaging of the abdomen and pelvis was performed following the standard protocol without IV contrast. RADIATION DOSE REDUCTION: This exam was performed according to the departmental dose-optimization program which includes automated exposure control, adjustment of the mA and/or kV according to patient size and/or use of iterative reconstruction technique. COMPARISON:  July 14, 2019 FINDINGS: Lower chest: No  acute abnormality. Hepatobiliary: No focal liver abnormality is seen. No gallstones, gallbladder wall thickening, or biliary dilatation. Pancreas: Unremarkable. No pancreatic ductal dilatation or surrounding inflammatory changes. Spleen: There is mild to moderate severity splenomegaly. Adrenals/Urinary Tract: Adrenal glands are unremarkable. Kidneys are normal in size, without focal lesions. Bilateral endo ureteral stents are noted. The proximal portion of the right-sided endo ureteral stent is seen inferior to the right renal pelvis (coronal reformatted images 46 through 50, CT series 5), while the left-sided endo ureteral stent is properly positioned. Ill-defined subcentimeter renal calculi are seen within the right renal pelvis which is mildly dilated. Numerous subcentimeter non-obstructing renal calculi are seen within the left kidney. There is marked severity diffuse urinary bladder wall thickening with air suspected within the lateral aspect of the bladder wall on the right. A Foley catheter is also present within the bladder lumen. Stomach/Bowel: Stomach is within normal limits. Appendix appears normal. No evidence of bowel wall thickening, distention, or inflammatory changes. Vascular/Lymphatic: Aortic atherosclerosis. No enlarged abdominal or pelvic lymph nodes. Reproductive: The prostate gland is remarkable. Other: No abdominal wall hernia or abnormality. No abdominopelvic ascites. Musculoskeletal: Multilevel degenerative changes seen throughout the lumbar spine. IMPRESSION: 1. Bilateral endo ureteral stents, with a malpositioned right-sided endo ureteral stent, as described above. Surgical an urology consultation is recommended. 2. Marked severity diffuse urinary bladder wall thickening with air suspected within the lateral aspect of the bladder wall on the right which may represent sequelae associated with cystitis. The presence of an underlying neoplastic process cannot be excluded. 3. Renal calculi  within the right renal pelvis with numerous subcentimeter non-obstructing left renal calculi. 4. Splenomegaly. 5. Aortic atherosclerosis. Aortic Atherosclerosis (ICD10-I70.0). Electronically Signed: By: Virgina Norfolk M.D. On: 12/26/2021 23:40   DG Chest Portable 1 View  Result Date: 12/26/2021 CLINICAL DATA:  New atrial fibrillation. EXAM: PORTABLE CHEST 1 VIEW COMPARISON:  Chest radiograph 10/14/2019 FINDINGS: Stable heart size allowing for differences in technique. Unchanged mediastinal contours. Aortic atherosclerosis and coronary stent. Mild hyperinflation and bronchial thickening, chronic. No acute airspace disease. No pulmonary edema. No pleural effusion or pneumothorax no acute osseous findings. IMPRESSION: 1. No acute chest findings. 2. Mild chronic hyperinflation and bronchial thickening. 3. Aortic atherosclerosis and coronary stent. Electronically Signed   By: Keith Rake M.D.   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

## 2021-12-28 NOTE — Progress Notes (Addendum)
NAMELadarrius Burns, MRN:  [id number], DOB:  Sep 07, 1956, LOS: 1 ADMISSION DATE:  12/26/2021, CONSULTATION DATE:  12/27/2021 REFERRING MD:  Dr. Tawanna Solo, CHIEF COMPLAINT:  Septic shock   Brief Pt Description / Synopsis:  66 year old male admitted with septic shock due to severe urosepsis and ESBL E.Coli BACTEREMIA from bilateral obstructing distal ureteral calculi, along with new onset atrial fibrillation with RVR, acute kidney injury, and metabolic acidosis.  Cardiology, urology, nephrology following.  History of Present Illness:  Wayne Burns is a 66 year old male with a past medical history significant for COPD, hypertension, chronic urinary retention with bilateral hydronephrosis dating back to 2020 who was brought to [facility] ED on 12/26/2021 due to family concerns that he was not being able to take care of himself with poor living conditions, refusing to go to hospital, and felt to be a danger to himself.  He was IVC by his family members.  Of note he was recently hospitalized at [facility] for 10 days (12/12/21-12/22/21) for treatment of AKI secondary to bilateral obstructing distal ureteral calculi.  Hospital course was complicated by type II coronary artery disease, sepsis, renal failure, respiratory failure, and melena.  Underwent EGD on 2/16 with evidence of prior gastric ulcers but no acute bleeding. Records were reviewed in [facility] with summary as follows:  Admitted 12/12/2021 with AKI secondary to bilateral obstructing distal ureteral calculi.  Creatinine was 10.  CT showed severe right hydronephrosis with a 0.7 cm obstructing stone at the right distal ureter/UVJ. Moderate to severe left hydronephrosis with a 0.8 cm obstructing stone at the left distal ureter/UVJ. Severe bilateral periureteral/perinephric stranding with trace fluid tracking along the bilateral anterior renal fascia.  Bilateral PCN placement initially recommended.  Placement of left PCN was successful however right PCN unable to be placed  secondary to patient agitation.  After PCN placement he was transferred to CCU and was ultimately intubated.  Due to critical condition additional procedures were deferred until there was clinical improvement. Underwent cystoscopy with bilateral ureteral stent placement 12/14/2013.  There was difficulty placing the right ureteral stent secondary to J hooking of the right proximal ureter after initial stent placement there was distal migration.  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  Left ureteral stent was placed without problems and left PCN was removed.  Urology plan was definitive stone treatment within the next 3 months after cardiac clearance After PCN placement ECG demonstrated new ST elevation in the inferior leads.  He was thought to have a type II event related to coronary artery disease, sepsis, renal failure and respiratory failure.  He was to follow-up with cardiology after discharge to discuss complex PCI in the near future During his hospitalization he was also found to have melena and underwent EGD 2/16 with evidence of possible prior gastric ulcers but no evidence of recent bleeding Positive COVID test 12/12/2021 with symptoms starting 11/29/2021 he received 5 days remdesivir and dexamethasone for 10 days starting on 2/8  ED Course: Initial vital signs: Temperature 98.2 F orally, respiratory rate 27, pulse 126, blood pressure 90/54, pulse ox 94% on room air Significant labs: Sodium 131, bicarbonate 18, glucose 156, BUN 65, creatinine 3.63, calcium 8.7, anion gap 13, alkaline phosphatase 204, albumin 2.7, AST 31, ALT 55, total bilirubin 2.1, CK10, high-sensitivity troponin 67, lactic acid 1.7, WBC 19.2, hemoglobin 10.2, hematocrit 32.2, serum acetaminophen less than 10, salicylates less than 7, ethyl alcohol less than 10 COVID-19 PCR  positive Urinalysis consistent with UTI Urine drug screen negative Imaging: Chest  x-ray>>Stable heart size allowing for differences in technique. Unchanged mediastinal contours. Aortic atherosclerosis and coronary stent. Mild hyperinflation and bronchial thickening, chronic. No acute airspace disease. No pulmonary edema. No pleural effusion or pneumothorax no acute osseous findings. CT abdomen pelvis without contrast>>IMPRESSION: 1. Bilateral endo ureteral stents, with a malpositioned right-sided endo ureteral stent, as described above. Surgical an urology consultation is recommended. 2. Marked severity diffuse urinary bladder wall thickening with air suspected within the lateral aspect of the bladder wall on the right which may represent sequelae associated with cystitis. The presence of an underlying neoplastic process cannot be excluded. 3. Renal calculi within the right renal pelvis with numerous subcentimeter non-obstructing left renal calculi. 4. Splenomegaly. 5. Aortic atherosclerosis.  He met sepsis criteria therefore he was given IV fluids and broad-spectrum antibiotics.  He was admitted by the hospitalist.  Cardiology, nephrology, and urology were consulted  While boarding in the ED and waiting for bed placement on 2/22, he became hypotensive concerning for developing septic shock.  PCCM was consulted due to potential need for vasopressors.  Pertinent  Medical History  COPD Hypertension  Micro Data:  2/21: SARS-CoV-2 PCR>>Positive 2/21: Influenza PCR>> negative 2/21: Blood culture>> ESBL E. coli 2/21: Urine>>  Antimicrobials:  Cefepime 2/21 x1 dose Ceftriaxone 2/21>>2/22 Meropenem 2/22>>  Significant Hospital Events: Including procedures, antibiotic start and stop dates in addition to other pertinent events   2/21: Admitted by hospitalist.  Cardiology, nephrology, urology consulted. 2/22: Developing septic shock requiring vasopressors, PCCM consulted.  Blood cultures positive for ESBL E. Coli.  Antibiotics changed to meropenem 2/23: Pt remains in  the ER pending bed availability.  Currently requiring neo-synephrine gtt _0  mg/min to maintain map >65 and amiodarone gtt _1  mg/hr   Interim History / Subjective:  Pt currently requiring neo-synephrine gtt at 65 mcg/min to maintain map >65.  Amiodarone gtt currently infusing _2  mg/hr heart rate currently controlled   Objective   Blood pressure 105/80, pulse 69, temperature 98.4 F (36.9 C), temperature source Oral, resp. rate (!) 23, height _3  (1.753 m), weight 72.6 kg, SpO2 100 %.        Intake/Output Summary (Last 24 hours) at 12/28/2021 0849 Last data filed at 12/27/2021 1907 Gross per 24 hour  Intake 955.69 ml  Output --  Net 955.69 ml   Filed Weights   12/26/21 2013  Weight: 72.6 kg   Examination: General: Acutely ill-appearing male resting in bed, NAD on RA  HENT: Atraumatic, normocephalic, neck supple, no JVD Lungs: Clear throughout, even, non labored  Cardiovascular: Irregular irregular, rate controlled, no R/G, 2+ radial/1+ distal pulses, no edema  Abdomen: +BS x4, soft, obese, non tender, non distended  Extremities: Normal bulk and tone, previous surgery to right shin and foot Neuro: Alert and oriented, following commands, PERRLA  GU: Foley catheter in place  Resolved Hospital Problem list   Mildly elevated LFT's  Assessment & Plan:   Septic Shock New onset Atrial fibrillation w/ RVR~rate controlled  Mildly elevated Troponin, suspect demand ischemia~peaked at 70 PMHx: Hypertension, CAD -Continuous cardiac monitoring -Maintain MAP >65 -IV fluids -Vasopressors as needed to maintain MAP goal -Continue Midodrine -Trend lactic acid until normalized (1.7 ~ 1.1) -Cardiology consulted, appreciate input -Continue Amiodarone as per Cardiology -Will defer anticoagulation to cardiology, CHA2DS2-VASc is 1 -Echocardiogram pending -Hold home amlodipine  Severe Urosepsis & ESBL E. coli BACTEREMIA due to bilateral obstructing distal ureteral calculi (Right ureteral  stent displacement) COVID-19  infection (asymptomatic) -Monitor fever curve -Trend WBC's & Procalcitonin -Follow cultures as above -ID consulted appreciate input: continue empiric Meropenem pending cultures & sensitivities (per ID pt will need a minimum of 2 weeks IV abx therapy) -Urology is following, appreciate input ~stents placed at [facility] previously, noted to have proximal migration of the right ureteral stent (remains well above the distal ureter) without significant hydronephrosis on CT.  Urology recommends conservative management for now  Acute kidney injury with chronic kidney disease stage IV~baseline creatinine 2.8 with GFR 25 Mild Hyponatremia Anion Gap Metabolic Acidosis~worsening  -Monitor I&O's / urinary output -Follow BMP -Ensure adequate renal perfusion -Avoid nephrotoxic agents as able -Replace electrolytes as indicated -IV fluids -Nephrology consulted, appreciate input -Continue bicarb drip  Acute Metabolic Encephalopathy in setting of severe sepsis and multiple metabolic derangements~improving  -Treat metabolic derangements and infection as above -Provide supportive care -Encourage normal sleep/wake cycle -Avoid sedating medications as able -Pt is IVC  Normocytic Normochromic Anemia without overt s/sx of blood loss PMHx: Recent Melena at [facility] with EGD revealing gastric ulcers with no evidence acute bleeding -Monitor for S/Sx of bleeding -Trend CBC -Heparin SQ for VTE Prophylaxis  -Transfuse for Hgb <7 -PPI BID  COPD without acute exacerbation COVID-19 infection (asymptomatic) -Supplemental O2 as needed to maintain O2 sats 88 to 92% -Follow intermittent chest x-ray and ABG as needed -Bronchodilators -IV steroids (will decreases solumedrol to 40 mg daily)~wean as tolerated  -Continue Spiriva  Hyperglycemia -CBG's q4h; Target range of 140 to 180 -SSI -Follow ICU Hypo/Hyperglycemia protocol  Best Practice (right click and "Reselect all SmartList Selections"  daily)   Diet/type: Regular consistency (see orders) DVT prophylaxis: prophylactic heparin  GI prophylaxis: PPI Lines: N/A Foley:  Yes, and it is still needed Code Status:  full code Last date of multidisciplinary goals of care discussion [12/28/2021]  Labs   CBC: Recent Labs  Lab 12/26/21 2020 12/27/21 0532 12/28/21 0500  WBC 19.2* 14.4* 18.5*  NEUTROABS  --   --  16.3*  HGB 10.2* 8.3* 8.1*  HCT 32.2* 25.6* 25.4*  MCV 89.2 89.2 87.3  PLT 207 156 267    Basic Metabolic Panel: Recent Labs  Lab 12/26/21 2020 12/27/21 0532 12/28/21 0500  NA 131* 132* 130*  K 4.0 4.2 4.2  CL 100 105 105  CO2 18* 17* 14*  GLUCOSE 156* 138* 260*  BUN 65* 64* 67*  CREATININE 3.63* 3.82* 3.86*  CALCIUM 8.7* 8.0* 7.7*  MG  --   --  1.7  PHOS  --   --  4.5   GFR: Estimated Creatinine Clearance: 19.6 mL/min (A) (by C-G formula based on SCr of 3.86 mg/dL (H)). Recent Labs  Lab 12/26/21 2020 12/27/21 0532 12/27/21 1108 12/27/21 1407 12/28/21 0500  PROCALCITON  --   --  1.78  --  1.43  WBC 19.2* 14.4*  --   --  18.5*  LATICACIDVEN 1.7  --   --  1.1 2.0*    Liver Function Tests: Recent Labs  Lab 12/26/21 2020 12/27/21 0532  AST 31 28  ALT 55* 44  ALKPHOS 204* 167*  BILITOT 2.1* 1.5*  PROT 8.1 6.8  ALBUMIN 2.7* 2.2*   No results for input(s): LIPASE, AMYLASE in the last 168 hours. No results for input(s): AMMONIA in the last 168 hours.  ABG No results found for: PHART, PCO2ART, PO2ART, HCO3, TCO2, ACIDBASEDEF, O2SAT   Coagulation Profile: No results for input(s): INR, PROTIME in the last 168 hours.  Cardiac Enzymes: Recent Labs  Lab 12/26/21 2020  CKTOTAL 10*    HbA1C: No results found for: HGBA1C  CBG: No results for input(s): GLUCAP in the last 168 hours.  Review of Systems: Positives in BOLD   Gen: Denies fever, chills, weight change, fatigue/malaise, night sweats HEENT: Denies blurred vision, double vision, hearing loss, tinnitus, sinus congestion,  rhinorrhea, sore throat, neck stiffness, dysphagia PULM: Denies shortness of breath, cough, sputum production, hemoptysis, wheezing CV: Denies chest pain, edema, orthopnea, paroxysmal nocturnal dyspnea, palpitations GI: Denies abdominal pain, nausea, vomiting, diarrhea, hematochezia, melena, constipation, change in bowel habits GU: Denies dysuria, hematuria, polyuria, oliguria, urethral discharge Endocrine: Denies hot or cold intolerance, polyuria, polyphagia or appetite change Derm: Denies rash, dry skin, scaling or peeling skin change Heme: Denies easy bruising, bleeding, bleeding gums Neuro: Denies headache, numbness, weakness, slurred speech, loss of memory or consciousness  Past Medical History:  He,  has a past medical history of COPD (chronic obstructive pulmonary disease) ([place]) and Hypertension.   Surgical History:   Past Surgical History:  Procedure Laterality Date   ANKLE SURGERY       Social History:   reports that he has quit smoking. His smoking use included cigarettes. He smoked an average of .5 packs per day. He has never used smokeless tobacco. He reports that he does not currently use alcohol. He reports that he does not currently use drugs.   Family History:  His family history is not on file.   Allergies No Known Allergies   Home Medications  Prior to Admission medications   Medication Sig Start Date End Date Taking? Authorizing Provider  amLODipine (NORVASC) 10 MG tablet Take 10 mg by mouth daily. 04/27/19 10/24/19  [provider]  atorvastatin (LIPITOR) 80 MG tablet Take 80 mg by mouth daily. 12/23/21   [provider]  cefdinir (OMNICEF) 300 MG capsule Take 300 mg by mouth once. 12/23/21   [provider]  pantoprazole (PROTONIX) 40 MG tablet Take 40 mg by mouth 2 (two) times daily. 12/23/21   [provider]  sertraline (ZOLOFT) 100 MG tablet Take 100 mg by mouth 2 (two) times daily. 08/26/21   [provider]   sertraline (ZOLOFT) 50 MG tablet Take 50 mg by mouth daily. 04/27/19 07/29/19  [provider]  tamsulosin (FLOMAX) 0.4 MG CAPS capsule Take 0.4 mg by mouth daily. 05/05/19   [provider]  tiotropium (SPIRIVA HANDIHALER) 18 MCG inhalation capsule Place 1 capsule into inhaler and inhale daily. 05/15/19 11/11/19  [provider]  traZODone (DESYREL) 50 MG tablet Take 100 mg by mouth at bedtime. 05/15/19 08/13/19  [provider]     Critical care time: 40 minutes    Donell Beers, [place] Pager [phone] (please enter 7 digits) PCCM Consult Pager [phone] (please enter 7 digits)

## 2021-12-28 NOTE — Progress Notes (Signed)
°   12/27/21 2010  Clinical Encounter Type  Visited With Patient  Visit Type Initial;Social support  Referral From Other (Comment) (rounding)  Spiritual Encounters  Spiritual Needs Other (Comment) (social support)   Chaplain Burris engaged Pt while rounding on unit. Pt welcomed the visit and shared about his recent health concerns and hospitalizations. Pt did not present any clear spiritual need but seemed to primarily need social support. Chaplain Burris offered a compassionate, supportive presence and developed a relationship of care with Pt; chaplain let Pt know that ongoing pastoral care remains available as needed.

## 2021-12-28 NOTE — ED Notes (Addendum)
Pt given orange juice, pt ate 100% of breakfast- redressed hand IV

## 2021-12-28 NOTE — ED Notes (Signed)
Report given to Brandon, RN

## 2021-12-29 DIAGNOSIS — I4891 Unspecified atrial fibrillation: Secondary | ICD-10-CM | POA: Diagnosis not present

## 2021-12-29 DIAGNOSIS — A498 Other bacterial infections of unspecified site: Secondary | ICD-10-CM | POA: Diagnosis not present

## 2021-12-29 DIAGNOSIS — U071 COVID-19: Secondary | ICD-10-CM

## 2021-12-29 DIAGNOSIS — R6521 Severe sepsis with septic shock: Secondary | ICD-10-CM | POA: Diagnosis not present

## 2021-12-29 DIAGNOSIS — Z1612 Extended spectrum beta lactamase (ESBL) resistance: Secondary | ICD-10-CM | POA: Diagnosis not present

## 2021-12-29 DIAGNOSIS — A419 Sepsis, unspecified organism: Secondary | ICD-10-CM | POA: Diagnosis not present

## 2021-12-29 DIAGNOSIS — N390 Urinary tract infection, site not specified: Secondary | ICD-10-CM | POA: Diagnosis not present

## 2021-12-29 DIAGNOSIS — R7881 Bacteremia: Secondary | ICD-10-CM | POA: Diagnosis not present

## 2021-12-29 LAB — COMPREHENSIVE METABOLIC PANEL
ALT: 35 U/L (ref 0–44)
AST: 18 U/L (ref 15–41)
Albumin: 2.3 g/dL — ABNORMAL LOW (ref 3.5–5.0)
Alkaline Phosphatase: 157 U/L — ABNORMAL HIGH (ref 38–126)
Anion gap: 12 (ref 5–15)
BUN: 57 mg/dL — ABNORMAL HIGH (ref 8–23)
CO2: 18 mmol/L — ABNORMAL LOW (ref 22–32)
Calcium: 7.4 mg/dL — ABNORMAL LOW (ref 8.9–10.3)
Chloride: 97 mmol/L — ABNORMAL LOW (ref 98–111)
Creatinine, Ser: 3.34 mg/dL — ABNORMAL HIGH (ref 0.61–1.24)
GFR, Estimated: 20 mL/min — ABNORMAL LOW (ref >60)
Glucose, Bld: 325 mg/dL — ABNORMAL HIGH (ref 70–99)
Potassium: 3.7 mmol/L (ref 3.5–5.1)
Sodium: 127 mmol/L — ABNORMAL LOW (ref 135–145)
Total Bilirubin: 0.7 mg/dL (ref 0.3–1.2)
Total Protein: 7 g/dL (ref 6.5–8.1)

## 2021-12-29 LAB — GLUCOSE, CAPILLARY
Glucose-Capillary: 312 mg/dL — ABNORMAL HIGH (ref 70–99)
Glucose-Capillary: 283 mg/dL — ABNORMAL HIGH (ref 70–99)
Glucose-Capillary: 246 mg/dL — ABNORMAL HIGH (ref 70–99)
Glucose-Capillary: 161 mg/dL — ABNORMAL HIGH (ref 70–99)

## 2021-12-29 LAB — CBC WITH DIFFERENTIAL/PLATELET
Abs Immature Granulocytes: 0.26 10*3/uL — ABNORMAL HIGH (ref 0.00–0.07)
Basophils Absolute: 0 10*3/uL (ref 0.0–0.1)
Basophils Relative: 0 %
Eosinophils Absolute: 0 10*3/uL (ref 0.0–0.5)
Eosinophils Relative: 0 %
HCT: 19.9 % — ABNORMAL LOW (ref 39.0–52.0)
Hemoglobin: 6.7 g/dL — ABNORMAL LOW (ref 13.0–17.0)
Immature Granulocytes: 1 %
Lymphocytes Relative: 5 %
Lymphs Abs: 0.9 10*3/uL (ref 0.7–4.0)
MCH: 28.5 pg (ref 26.0–34.0)
MCHC: 33.7 g/dL (ref 30.0–36.0)
MCV: 84.7 fL (ref 80.0–100.0)
Monocytes Absolute: 0.2 10*3/uL (ref 0.1–1.0)
Monocytes Relative: 1 %
Neutro Abs: 17.5 10*3/uL — ABNORMAL HIGH (ref 1.7–7.7)
Neutrophils Relative %: 93 %
Platelets: 181 10*3/uL (ref 150–400)
RBC: 2.35 MIL/uL — ABNORMAL LOW (ref 4.22–5.81)
RDW: 18.6 % — ABNORMAL HIGH (ref 11.5–15.5)
Smear Review: NORMAL
WBC: 19 10*3/uL — ABNORMAL HIGH (ref 4.0–10.5)
nRBC: 0.2 % (ref 0.0–0.2)

## 2021-12-29 LAB — RENAL FUNCTION PANEL
Albumin: 1.8 g/dL — ABNORMAL LOW (ref 3.5–5.0)
Anion gap: 10 (ref 5–15)
BUN: 51 mg/dL — ABNORMAL HIGH (ref 8–23)
CO2: 29 mmol/L (ref 22–32)
Calcium: 6.1 mg/dL — CL (ref 8.9–10.3)
Chloride: 89 mmol/L — ABNORMAL LOW (ref 98–111)
Creatinine, Ser: 2.84 mg/dL — ABNORMAL HIGH (ref 0.61–1.24)
GFR, Estimated: 24 mL/min — ABNORMAL LOW (ref >60)
Glucose, Bld: 684 mg/dL (ref 70–99)
Phosphorus: 3.6 mg/dL (ref 2.5–4.6)
Potassium: 2.8 mmol/L — ABNORMAL LOW (ref 3.5–5.1)
Sodium: 128 mmol/L — ABNORMAL LOW (ref 135–145)

## 2021-12-29 LAB — HEMOGLOBIN A1C
Hgb A1c MFr Bld: 5.9 % — ABNORMAL HIGH (ref 4.8–5.6)
Mean Plasma Glucose: 123 mg/dL

## 2021-12-29 LAB — PROCALCITONIN: Procalcitonin: 0.67 ng/mL

## 2021-12-29 LAB — CULTURE, BLOOD (ROUTINE X 2): Special Requests: ADEQUATE

## 2021-12-29 LAB — CORTISOL: Cortisol, Plasma: 5.2 ug/dL

## 2021-12-29 LAB — LACTIC ACID, PLASMA: Lactic Acid, Venous: 1.8 mmol/L (ref 0.5–1.9)

## 2021-12-29 LAB — HEMOGLOBIN AND HEMATOCRIT, BLOOD
HCT: 25 % — ABNORMAL LOW (ref 39.0–52.0)
Hemoglobin: 8.3 g/dL — ABNORMAL LOW (ref 13.0–17.0)

## 2021-12-29 LAB — MAGNESIUM: Magnesium: 1.6 mg/dL — ABNORMAL LOW (ref 1.7–2.4)

## 2021-12-29 MED ORDER — MELATONIN 5 MG PO TABS
5.0000 mg | ORAL_TABLET | Freq: Every day | ORAL | Status: DC
  Administered 2021-12-29 – 2022-01-13 (×15): 5 mg via ORAL
  Filled 2021-12-29 (×16): qty 1

## 2021-12-29 MED ORDER — MAGNESIUM SULFATE 2 GM/50ML IV SOLN
2.0000 g | Freq: Once | INTRAVENOUS | Status: AC
  Administered 2021-12-29: 2 g via INTRAVENOUS
  Filled 2021-12-29: qty 50

## 2021-12-29 MED ORDER — INSULIN DETEMIR 100 UNIT/ML ~~LOC~~ SOLN
10.0000 [IU] | Freq: Every day | SUBCUTANEOUS | Status: DC
  Administered 2021-12-29 – 2022-01-02 (×4): 10 [IU] via SUBCUTANEOUS
  Filled 2021-12-29 (×6): qty 0.1

## 2021-12-29 MED ORDER — INSULIN ASPART 100 UNIT/ML IJ SOLN: 0.0000 [IU] | Freq: Every day | INTRAMUSCULAR | Status: DC

## 2021-12-29 MED ORDER — POTASSIUM CHLORIDE CRYS ER 20 MEQ PO TBCR: 40.0000 meq | EXTENDED_RELEASE_TABLET | Freq: Once | ORAL | Status: DC

## 2021-12-29 MED ORDER — INSULIN ASPART 100 UNIT/ML IJ SOLN
0.0000 [IU] | Freq: Three times a day (TID) | INTRAMUSCULAR | Status: DC
  Administered 2021-12-29: 5 [IU] via SUBCUTANEOUS
  Administered 2021-12-29: 8 [IU] via SUBCUTANEOUS
  Administered 2021-12-30: 3 [IU] via SUBCUTANEOUS
  Administered 2021-12-30 – 2021-12-31 (×2): 2 [IU] via SUBCUTANEOUS
  Administered 2022-01-01 – 2022-01-02 (×2): 3 [IU] via SUBCUTANEOUS
  Administered 2022-01-03 – 2022-01-07 (×4): 2 [IU] via SUBCUTANEOUS
  Filled 2021-12-29 (×11): qty 1

## 2021-12-29 MED ORDER — AMIODARONE HCL 200 MG PO TABS
200.0000 mg | ORAL_TABLET | Freq: Two times a day (BID) | ORAL | Status: AC
  Administered 2021-12-29 – 2022-01-04 (×14): 200 mg via ORAL
  Filled 2021-12-29 (×14): qty 1

## 2021-12-29 MED ORDER — MENTHOL 3 MG MT LOZG
1.0000 | LOZENGE | OROMUCOSAL | Status: DC | PRN
  Administered 2021-12-29: 3 mg via ORAL
  Filled 2021-12-29: qty 9

## 2021-12-29 MED ORDER — MUPIROCIN 2 % EX OINT
1.0000 "application " | TOPICAL_OINTMENT | Freq: Two times a day (BID) | CUTANEOUS | Status: AC
  Administered 2021-12-30 – 2022-01-03 (×10): 1 via NASAL
  Filled 2021-12-29: qty 22

## 2021-12-29 MED ORDER — TRAZODONE HCL 50 MG PO TABS
50.0000 mg | ORAL_TABLET | Freq: Every day | ORAL | Status: DC
  Administered 2022-01-02 – 2022-01-04 (×2): 50 mg via ORAL
  Filled 2021-12-29 (×12): qty 1

## 2021-12-29 MED ORDER — PNEUMOCOCCAL VAC POLYVALENT 25 MCG/0.5ML IJ INJ
0.5000 mL | INJECTION | INTRAMUSCULAR | Status: DC
  Filled 2021-12-29: qty 0.5

## 2021-12-29 MED ORDER — NEPRO/CARBSTEADY PO LIQD
237.0000 mL | Freq: Three times a day (TID) | ORAL | Status: DC
  Administered 2021-12-29 (×2): 237 mL via ORAL

## 2021-12-29 MED ORDER — PANTOPRAZOLE SODIUM 40 MG PO TBEC
40.0000 mg | DELAYED_RELEASE_TABLET | Freq: Two times a day (BID) | ORAL | Status: DC
  Administered 2021-12-29 – 2022-01-14 (×32): 40 mg via ORAL
  Filled 2021-12-29 (×32): qty 1

## 2021-12-29 MED ORDER — POTASSIUM CHLORIDE 10 MEQ/100ML IV SOLN
10.0000 meq | INTRAVENOUS | Status: DC
  Administered 2021-12-29: 10 meq via INTRAVENOUS
  Filled 2021-12-29 (×3): qty 100

## 2021-12-29 NOTE — Plan of Care (Signed)
Patient weaned off of Amio and Neo infusions earlier today. Assisted to [facility] and to chair by this RN. Remains on Bicarb infusion.   Problem: Education: Goal: Knowledge of General Education information will improve Description: Including pain rating scale, medication(s)/side effects and non-pharmacologic comfort measures Outcome: Progressing   Problem: Education: Goal: Knowledge of General Education information will improve Description: Including pain rating scale, medication(s)/side effects and non-pharmacologic comfort measures Outcome: Progressing   Problem: Health Behavior/Discharge Planning: Goal: Ability to manage health-related needs will improve Outcome: Progressing   Problem: Clinical Measurements: Goal: Ability to maintain clinical measurements within normal limits will improve Outcome: Progressing Goal: Will remain free from infection Outcome: Progressing Goal: Diagnostic test results will improve Outcome: Progressing Goal: Respiratory complications will improve Outcome: Progressing Goal: Cardiovascular complication will be avoided Outcome: Progressing   Problem: Activity: Goal: Risk for activity intolerance will decrease Outcome: Progressing   Problem: Nutrition: Goal: Adequate nutrition will be maintained Outcome: Progressing   Problem: Coping: Goal: Level of anxiety will decrease Outcome: Progressing   Problem: Elimination: Goal: Will not experience complications related to bowel motility Outcome: Progressing Goal: Will not experience complications related to urinary retention Outcome: Progressing   Problem: Pain Managment: Goal: General experience of comfort will improve Outcome: Progressing   Problem: Safety: Goal: Ability to remain free from injury will improve Outcome: Progressing   Problem: Skin Integrity: Goal: Risk for impaired skin integrity will decrease Outcome: Progressing   Problem: Education: Goal: Knowledge of risk factors and  measures for prevention of condition will improve Outcome: Progressing   Problem: Coping: Goal: Psychosocial and spiritual needs will be supported Outcome: Progressing   Problem: Respiratory: Goal: Will maintain a patent airway Outcome: Progressing Goal: Complications related to the disease process, condition or treatment will be avoided or minimized Outcome: Progressing

## 2021-12-29 NOTE — Progress Notes (Addendum)
[place] NOTE       Patient ID: Wayne Burns MRN: [id number] DOB/AGE: 12-19-1955 66 y.o.  Admit date: 12/26/2021 Referring Physician Dr. Shelly Coss  Primary Physician Dr. Earlie Counts [facility]  Primary Cardiologist  Reason for Consultation AF RVR  HPI: The patient is a 66 year old male with a past medical history notable for hypertension, COPD who was brought in by EMS under IVC by his family members for not being able to take care of himself.  He was apparently living in very poor conditions, refusing to go to the hospital and was deemed a danger to himself. He was hospitalized at [facility] for 10 days and discharged on 2/17 after bilateral ureteral stent placements and cardiology f/u was recommended for discussion of complex PCI placement as there was concern for inferior STE in the setting of CAD, sepsis, renal and respiratory failure. On arrival to [facility] ED 2/21 patient met sepsis criteria and in atrial fibrillation with RVR.  Cardiology is consulted for further assistance of his A-fib.  Interval History:  -states he feels fatigued but "feels better and better each day." No chest pain, no change in SOB. Denies melena or hematuria. -remains in AF but rate controlled with amio gtt. On neo and midodrine.   Review of systems complete and found to be negative unless listed above     Past Medical History:  Diagnosis Date   COPD (chronic obstructive pulmonary disease) ([place])    Hypertension     Past Surgical History:  Procedure Laterality Date   ANKLE SURGERY      Medications Prior to Admission  Medication Sig Dispense Refill Last Dose   amLODipine (NORVASC) 10 MG tablet Take 10 mg by mouth daily.      atorvastatin (LIPITOR) 80 MG tablet Take 80 mg by mouth daily.      cefdinir (OMNICEF) 300 MG capsule Take 300 mg by mouth once.      pantoprazole (PROTONIX) 40 MG tablet Take 40 mg by mouth 2 (two) times daily.      sertraline (ZOLOFT) 100 MG tablet Take 100 mg  by mouth 2 (two) times daily.      sertraline (ZOLOFT) 50 MG tablet Take 50 mg by mouth daily.      tamsulosin (FLOMAX) 0.4 MG CAPS capsule Take 0.4 mg by mouth daily.      tiotropium (SPIRIVA HANDIHALER) 18 MCG inhalation capsule Place 1 capsule into inhaler and inhale daily.      traZODone (DESYREL) 50 MG tablet Take 100 mg by mouth at bedtime.       Social History   Socioeconomic History   Marital status: Single    Spouse name: Not on file   Number of children: Not on file   Years of education: Not on file   Highest education level: Not on file  Occupational History   Not on file  Tobacco Use   Smoking status: Former    Packs/day: 0.50    Types: Cigarettes   Smokeless tobacco: Never  Vaping Use   Vaping Use: Never used  Substance and Sexual Activity   Alcohol use: Not Currently    Comment: often   Drug use: Not Currently   Sexual activity: Not on file  Other Topics Concern   Not on file  Social History Narrative   Not on file   Social Determinants of Health   Financial Resource Strain: Not on file  Food Insecurity: Not on file  Transportation Needs: Not on file  Physical Activity: Not on file  Stress: Not on file  Social Connections: Not on file  Intimate Partner Violence: Not on file    History reviewed. No pertinent family history.    Review of systems complete and found to be negative unless listed above    PHYSICAL EXAM General: Disheveled and ill-appearing Caucasian male, in no acute distress.  Sitting upright in recliner in ICU HEENT:  Normocephalic and atraumatic. Neck:  No JVD.  Lungs: Normal respiratory effort on room air. No crackles. Heart: Irregularly irregular rhythm with controlled rate. Normal S1 and S2 without gallops or murmurs. Radial & DP pulses 2+ bilaterally. Abdomen: Obese appearing.  Msk: Normal strength and tone for age. Extremities: Warm and well perfused. No clubbing, cyanosis.  Right forearm with ecchymosis. no lower extremity  edema. R foot with post surgical scar Neuro: Alert and oriented X 3. Psych:  Answers questions appropriately.   Labs:   Lab Results  Component Value Date   WBC 19.0 (H) 12/29/2021   HGB 8.3 (L) 12/29/2021   HCT 25.0 (L) 12/29/2021   MCV 84.7 12/29/2021   PLT 181 12/29/2021    Recent Labs  Lab 12/29/21 0716  NA 127*  K 3.7  CL 97*  CO2 18*  BUN 57*  CREATININE 3.34*  CALCIUM 7.4*  PROT 7.0  BILITOT 0.7  ALKPHOS 157*  ALT 35  AST 18  GLUCOSE 325*    Lab Results  Component Value Date   CKTOTAL 10 (L) 12/26/2021    No results found for: CHOL No results found for: HDL No results found for: LDLCALC No results found for: TRIG No results found for: CHOLHDL No results found for: LDLDIRECT    Radiology: CT ABDOMEN PELVIS WO CONTRAST  Addendum Date: 12/26/2021   ADDENDUM REPORT: 12/26/2021 23:44 ADDENDUM: It should be noted that the air seen within the wall of the urinary bladder may represent sequelae associated with the previously described malpositioned right-sided endo ureteral stent. Electronically Signed   By: Virgina Norfolk M.D.   On: 12/26/2021 23:44   Result Date: 12/26/2021 CLINICAL DATA:  Patient with altered mentation and recent history of COVID positive. EXAM: CT ABDOMEN AND PELVIS WITHOUT CONTRAST TECHNIQUE: Multidetector CT imaging of the abdomen and pelvis was performed following the standard protocol without IV contrast. RADIATION DOSE REDUCTION: This exam was performed according to the departmental dose-optimization program which includes automated exposure control, adjustment of the mA and/or kV according to patient size and/or use of iterative reconstruction technique. COMPARISON:  July 14, 2019 FINDINGS: Lower chest: No acute abnormality. Hepatobiliary: No focal liver abnormality is seen. No gallstones, gallbladder wall thickening, or biliary dilatation. Pancreas: Unremarkable. No pancreatic ductal dilatation or surrounding inflammatory changes. Spleen:  There is mild to moderate severity splenomegaly. Adrenals/Urinary Tract: Adrenal glands are unremarkable. Kidneys are normal in size, without focal lesions. Bilateral endo ureteral stents are noted. The proximal portion of the right-sided endo ureteral stent is seen inferior to the right renal pelvis (coronal reformatted images 46 through 50, CT series 5), while the left-sided endo ureteral stent is properly positioned. Ill-defined subcentimeter renal calculi are seen within the right renal pelvis which is mildly dilated. Numerous subcentimeter non-obstructing renal calculi are seen within the left kidney. There is marked severity diffuse urinary bladder wall thickening with air suspected within the lateral aspect of the bladder wall on the right. A Foley catheter is also present within the bladder lumen. Stomach/Bowel: Stomach is within normal limits. Appendix appears normal. No evidence of  bowel wall thickening, distention, or inflammatory changes. Vascular/Lymphatic: Aortic atherosclerosis. No enlarged abdominal or pelvic lymph nodes. Reproductive: The prostate gland is remarkable. Other: No abdominal wall hernia or abnormality. No abdominopelvic ascites. Musculoskeletal: Multilevel degenerative changes seen throughout the lumbar spine. IMPRESSION: 1. Bilateral endo ureteral stents, with a malpositioned right-sided endo ureteral stent, as described above. Surgical an urology consultation is recommended. 2. Marked severity diffuse urinary bladder wall thickening with air suspected within the lateral aspect of the bladder wall on the right which may represent sequelae associated with cystitis. The presence of an underlying neoplastic process cannot be excluded. 3. Renal calculi within the right renal pelvis with numerous subcentimeter non-obstructing left renal calculi. 4. Splenomegaly. 5. Aortic atherosclerosis. Aortic Atherosclerosis (ICD10-I70.0). Electronically Signed: By: Virgina Norfolk M.D. On: 12/26/2021  23:40   DG Chest Portable 1 View  Result Date: 12/26/2021 CLINICAL DATA:  New atrial fibrillation. EXAM: PORTABLE CHEST 1 VIEW COMPARISON:  Chest radiograph 10/14/2019 FINDINGS: Stable heart size allowing for differences in technique. Unchanged mediastinal contours. Aortic atherosclerosis and coronary stent. Mild hyperinflation and bronchial thickening, chronic. No acute airspace disease. No pulmonary edema. No pleural effusion or pneumothorax no acute osseous findings. IMPRESSION: 1. No acute chest findings. 2. Mild chronic hyperinflation and bronchial thickening. 3. Aortic atherosclerosis and coronary stent. Electronically Signed   By: Keith Rake M.D.   On: 12/26/2021 20:46    ECHO 12/12/21 Summary    1. An ultrasound enhancing agent was used to improve the visualization of  the left ventricular cavity and endocardial borders.    2. Technically difficult study.    3. The left ventricle is upper normal in size with normal wall thickness.    4. The left ventricular systolic function is severely decreased, LVEF is  visually estimated at 25-30%.    5. There is thinning and akinesis involving the mid inferior and basal  inferior segment(s).    6. There is grade III diastolic dysfunction (severely elevated filling  pressure).    7. The right ventricle is normal in size, with low normal systolic function.   TELEMETRY reviewed by me: Atrial fibrillation with rate peaking in the 140s, between 110-125 during interview with occasional PVCs  EKG reviewed by me: Atrial fibrillation rate 121, old inferior infarct  Cardiac cath at [facility] 12/13/21 Coronary Angiography   Dominance: Right   Left Coronary:    - Left Main:  Large caliber vessel that bifurcates into the LAD and LCx.  There is 25% distal stenosis.     LAD:  - Heavily calcified, large caliber vessel that is subtotally occluded at  the early-mid vessel just after the take-off of the first diagonal. D1 is  a moderate-large caliber and  provides L>L collaterals to the OM territory.  D1 has moderate proximal and early mid-vessel disease. The first septal  provides L>R collaterals to the PLA and PLV.   LCx:  - Moderate caliber vessel that is chronically occluded in the proximal  vessel. OM1 is long and of small caliber, without significant disease. A  mid/distal portion of a high OM-2 vessel fills via L>L collaterals. The  mid/distal OM appears to have significant disease.   Right Coronary:    - Dominant vessel that is chronically totally occluded at the proximal  vessel. The proximal vessel The distal vessel/PDA/PLV fills with L>R  collaterals.   Complications:  None  Blood loss:  Minimal  Specimen:  None  Device/Implants:  NA   Pre-procedure Dx:  STEMI  Post-procedure Dx:  CAD   ASSESSMENT AND PLAN:  The patient is a 66 year old male with a past medical history notable for CAD, HFrEF (LVEF 25-30%), hypertension, COPD who was brought in by EMS under IVC by his family members for not being able to take care of himself.  He was apparently living in very poor conditions, refusing to go to the hospital and was deemed a danger to himself. He was hospitalized at [facility] for 10 days and discharged on 2/17 after bilateral ureteral stent placements and cardiology f/u was recommended for discussion of complex PCI placement as there was concern for inferior STE in the setting of CAD, sepsis, renal and respiratory failure. On arrival to [facility] ED 2/21 patient met sepsis criteria and in atrial fibrillation with RVR.  Cardiology is consulted for further assistance of his A-fib.  #New onset atrial fibrillation with RVR #Urosepsis & ESBL ecoli bacteremia  #septic shock #COVID-19, asymptomatic Patient presents to [facility] ED after a 10-day hospitalization at [facility] for treatment of kidney stones. He presents to [facility] ED 4 days after discharge "feeling horrible" and met sepsis criteria on admission.  He was also in A-fib with rates peaking in the  140s, blood pressure too soft to start diltiazem or metoprolol.  His troponin is minimally elevated to 67-70 thought to be 2/2 demand ischemia from sepsis and his tachycardia.   -Agree with treatment of bacteremia and urosepsis as per primary team -on neo and midodrine for hypotension, wean as tolerated per primary -defer eliquis for now with poor renal function -s/p Amio load. Transition from amio gtt today to PO amiodarone 269m BID x 7 days and then 2081monce daily therafter.  -prior echo from 2/8 now available with results above  -CHA2DS2-VASc is ~3 (after further records available)   #anemia -current Hgb is 8.1, [facility] show that patient needed multiple units of PRBCs and EGD 2/16 showed evidence of prior gastric ulcers but no acute bleeding  #CAD with multivessel disease 12/13/21 #hyperlipidemia #hypertension EKG shows A-fib with rate of 121, old inferior infarct to be consistent [facility].  remains chest pain-free.   -[facility] now available in care everywhere -- it resulted with 25% distal LM disease, subtotally occluded mid-LAD at the stent level, chronically occluded mid-Lcx and occluded proximal RCA  --intervention deferred aggressive secondary prevention recommended  -continue 8167mSA daily -continue atorvastatin 37m4mOn amlodipine at home, holding due to hypotension. Will likely transition from amlodipine to lisinopril for GDMT as his BP tolerates.   #HFrEF (LVEF 25-30% 12/13/21)  -appears euvolemic from a HF standpoint -not on GDMT currently d/t hypotension -recommend salt and fluid restriction  #Acute kidney injury Creatinine 3.82-3.7-3.34, EGFR 17-18-20, will continue to monitor. Gradually improving.  Appreciate nephrology input  This patient's plan of care was discussed and created with Dr. DwayLujean Amel he is in  agreement.  Signed: LilyTristan SchroederA-C 12/29/2021, 10:54 AM [facility]

## 2021-12-29 NOTE — Progress Notes (Signed)
PHARMACIST - PHYSICIAN COMMUNICATION  CONCERNING: IV to Oral Route Change Policy  RECOMMENDATION: This patient is receiving pantoprazole by the intravenous route.  Based on criteria approved by the Pharmacy and Therapeutics Committee, the intravenous medication(s) is/are being converted to the equivalent oral dose form(s).   DESCRIPTION: These criteria include: The patient is eating (either orally or via tube) and/or has been taking other orally administered medications for a least 24 hours The patient has no evidence of active gastrointestinal bleeding or impaired GI absorption (gastrectomy, short bowel, patient on TNA or NPO).  If you have questions about this conversion, please contact the [place], [facility] 12/29/2021 3:24 PM

## 2021-12-29 NOTE — Progress Notes (Signed)
NAMEAntoino Burns, MRN:  [id number], DOB:  Dec 28, 1955, LOS: 2 ADMISSION DATE:  12/26/2021, CONSULTATION DATE:  12/27/2021 REFERRING MD:  Dr. Tawanna Solo, CHIEF COMPLAINT:  Septic shock   Brief Pt Description / Synopsis:  66 year old male admitted with septic shock due to severe urosepsis and ESBL E.Coli BACTEREMIA from bilateral obstructing distal ureteral calculi, along with new onset atrial fibrillation with RVR, acute kidney injury, and metabolic acidosis.  Cardiology, urology, nephrology following.  History of Present Illness:  Wayne Burns is a 66 year old male with a past medical history significant for COPD, hypertension, chronic urinary retention with bilateral hydronephrosis dating back to 2020 who was brought to [facility] ED on 12/26/2021 due to family concerns that he was not being able to take care of himself with poor living conditions, refusing to go to hospital, and felt to be a danger to himself.  He was IVC by his family members.  Of note he was recently hospitalized at [facility] for 10 days (12/12/21-12/22/21) for treatment of AKI secondary to bilateral obstructing distal ureteral calculi.  Hospital course was complicated by type II coronary artery disease, sepsis, renal failure, respiratory failure, and melena.  Underwent EGD on 2/16 with evidence of prior gastric ulcers but no acute bleeding. Records were reviewed in [facility] with summary as follows:  Admitted 12/12/2021 with AKI secondary to bilateral obstructing distal ureteral calculi.  Creatinine was 10.  CT showed severe right hydronephrosis with a 0.7 cm obstructing stone at the right distal ureter/UVJ. Moderate to severe left hydronephrosis with a 0.8 cm obstructing stone at the left distal ureter/UVJ. Severe bilateral periureteral/perinephric stranding with trace fluid tracking along the bilateral anterior renal fascia.  Bilateral PCN placement initially recommended.  Placement of left PCN was successful however right PCN unable to be placed  secondary to patient agitation.  After PCN placement he was transferred to CCU and was ultimately intubated.  Due to critical condition additional procedures were deferred until there was clinical improvement. Underwent cystoscopy with bilateral ureteral stent placement 12/14/2013.  There was difficulty placing the right ureteral stent secondary to J hooking of the right proximal ureter after initial stent placement there was distal migration.  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  Left ureteral stent was placed without problems and left PCN was removed.  Urology plan was definitive stone treatment within the next 3 months after cardiac clearance After PCN placement ECG demonstrated new ST elevation in the inferior leads.  He was thought to have a type II event related to coronary artery disease, sepsis, renal failure and respiratory failure.  He was to follow-up with cardiology after discharge to discuss complex PCI in the near future During his hospitalization he was also found to have melena and underwent EGD 2/16 with evidence of possible prior gastric ulcers but no evidence of recent bleeding Positive COVID test 12/12/2021 with symptoms starting 11/29/2021 he received 5 days remdesivir and dexamethasone for 10 days starting on 2/8  ED Course: Initial vital signs: Temperature 98.2 F orally, respiratory rate 27, pulse 126, blood pressure 90/54, pulse ox 94% on room air Significant labs: Sodium 131, bicarbonate 18, glucose 156, BUN 65, creatinine 3.63, calcium 8.7, anion gap 13, alkaline phosphatase 204, albumin 2.7, AST 31, ALT 55, total bilirubin 2.1, CK10, high-sensitivity troponin 67, lactic acid 1.7, WBC 19.2, hemoglobin 10.2, hematocrit 32.2, serum acetaminophen less than 10, salicylates less than 7, ethyl alcohol less than 10 COVID-19 PCR  positive Urinalysis consistent with UTI Urine drug screen negative Imaging: Chest  x-ray>>Stable heart size allowing for differences in technique. Unchanged mediastinal contours. Aortic atherosclerosis and coronary stent. Mild hyperinflation and bronchial thickening, chronic. No acute airspace disease. No pulmonary edema. No pleural effusion or pneumothorax no acute osseous findings. CT abdomen pelvis without contrast>>IMPRESSION: 1. Bilateral endo ureteral stents, with a malpositioned right-sided endo ureteral stent, as described above. Surgical an urology consultation is recommended. 2. Marked severity diffuse urinary bladder wall thickening with air suspected within the lateral aspect of the bladder wall on the right which may represent sequelae associated with cystitis. The presence of an underlying neoplastic process cannot be excluded. 3. Renal calculi within the right renal pelvis with numerous subcentimeter non-obstructing left renal calculi. 4. Splenomegaly. 5. Aortic atherosclerosis.  He met sepsis criteria therefore he was given IV fluids and broad-spectrum antibiotics.  He was admitted by the hospitalist.  Cardiology, nephrology, and urology were consulted  While boarding in the ED and waiting for bed placement on 2/22, he became hypotensive concerning for developing septic shock.  PCCM was consulted due to potential need for vasopressors.  Pertinent  Medical History  COPD Hypertension  Micro Data:  2/21: SARS-CoV-2 PCR>>Positive 2/21: Influenza PCR>> negative 2/21: Blood culture>> ESBL E. coli 2/21: Urine>> less than 10,000 colonies/ml insignificant growth   Antimicrobials:  Cefepime 2/21 x1 dose Ceftriaxone 2/21>>2/22 Meropenem 2/22>>  Significant Hospital Events: Including procedures, antibiotic start and stop dates in addition to other pertinent events   2/21: Admitted by hospitalist.  Cardiology, nephrology, urology consulted. 2/22: Developing septic shock requiring vasopressors, PCCM consulted.  Blood cultures positive for ESBL E. Coli.   Antibiotics changed to meropenem 2/23: Pt remains in the ER pending bed availability.  Currently requiring neo-synephrine gtt _0  mg/min to maintain map >65 and amiodarone gtt _1  mg/hr  2/24: Pt remains on amiodarone gtt and neo-synephrine gtt attempting to wean neo-synephrine gtt off  Interim History / Subjective:  Neo-synephrine gtt currently infusing at 40 mcg/min map >65.  Pt showing no s/sx of respiratory distress this am   Objective   Blood pressure (!) 104/49, pulse 80, temperature 98.2 F (36.8 C), resp. rate 17, height _2  (1.753 m), weight 92.6 kg, SpO2 99 %.        Intake/Output Summary (Last 24 hours) at 12/29/2021 0827 Last data filed at 12/29/2021 0300 Gross per 24 hour  Intake 6010.21 ml  Output 895 ml  Net 5115.21 ml   Filed Weights   12/26/21 2013 12/28/21 1500  Weight: 72.6 kg 92.6 kg   Examination: General: 66 yo male resting in bed, NAD on RA  HENT: Atraumatic, normocephalic, neck supple, no JVD Lungs: Clear throughout, even, non labored  Cardiovascular: Irregular irregular, rate controlled, no R/G, 2+ radial/1+ distal pulses, no edema  Abdomen: +BS x4, soft, obese, non tender, non distended  Extremities: Normal bulk and tone, previous surgery to right shin and foot Neuro: Alert and oriented, following commands, PERRLA  GU: Foley catheter in place  Resolved Hospital Problem list   Mildly elevated LFT's  Assessment & Plan:   Septic Shock New onset Atrial fibrillation w/ RVR~rate controlled  Mildly elevated Troponin, suspect demand ischemia~peaked at 70    Limited Echo 02/8 at [facility] revealed EF 25 to 30% with grade III diastolic dysfunction  PMHx: Hypertension, CAD -Continuous cardiac monitoring -Maintain MAP >65 -IV fluids -Vasopressors as needed to maintain MAP goal -Continue Midodrine -Trend lactic acid until normalized (1.7 ~ 1.1) -Cardiology consulted, appreciate input -  Continue Amiodarone as per Cardiology -Will defer anticoagulation to  cardiology, CHA2DS2-VASc is 1 -Hold home amlodipine  Severe Urosepsis & ESBL E. coli BACTEREMIA due to bilateral obstructing distal ureteral calculi (Right ureteral stent displacement) COVID-19 infection (asymptomatic) -Monitor fever curve -Trend WBC's & Procalcitonin -Follow cultures as above -ID consulted appreciate input: continue empiric Meropenem pending cultures & sensitivities (per ID pt will need a minimum of 2 weeks IV abx therapy) -Urology is following, appreciate input ~stents placed at [facility] previously, noted to have proximal migration of the right ureteral stent (remains well above the distal ureter) without significant hydronephrosis on CT.  Urology recommends conservative management for now  Acute kidney injury with chronic kidney disease stage IV~baseline creatinine 2.8 with GFR 25 Hyponatremia Anion Gap Metabolic Acidosis~slowly improving  -Monitor I&O's / urinary output -Follow BMP -Ensure adequate renal perfusion -Avoid nephrotoxic agents as able -Replace electrolytes as indicated -Nephrology consulted, appreciate input -Decreases sodium bicarb gtt to 75 ml/hr   Acute Metabolic Encephalopathy in setting of severe sepsis and multiple metabolic derangements~resolving  -Treat metabolic derangements and infection as above -Provide supportive care -Encourage normal sleep/wake cycle -Avoid sedating medications as able -Psych consulted pt deemed competent to make medical decisions and DOES NOT require IVC or psychiatric inpatient hospitalization   Normocytic Normochromic Anemia without overt s/sx of blood loss PMHx: Recent Melena at [facility] with EGD revealing gastric ulcers with no evidence acute bleeding -Monitor for S/Sx of bleeding -Trend CBC -Heparin SQ for VTE Prophylaxis  -Transfuse for Hgb <7 -PPI BID  COPD without acute exacerbation COVID-19 infection (asymptomatic) -Supplemental O2 as needed to maintain O2 sats 88 to 92% -Follow intermittent chest x-ray and ABG  as needed -Bronchodilators -IV steroids stopped 02/24 -Continue Spiriva  Hyperglycemia~worsening  -CBG's q4h; Target range of 140 to 180 -Started long acting insulin and changed SSI to moderate scale -Will consult diabetes coordinator  -IV steroids discontinued 02/24 -Follow ICU Hypo/Hyperglycemia protocol  Best Practice (right click and "Reselect all SmartList Selections" daily)   Diet/type: Regular consistency (see orders) DVT prophylaxis: prophylactic heparin  GI prophylaxis: PPI Lines: N/A Foley:  Yes, and it is still needed Code Status:  full code Last date of multidisciplinary goals of care discussion [12/29/2021]  Labs   CBC: Recent Labs  Lab 12/26/21 2020 12/27/21 0532 12/28/21 0500 12/29/21 0621 12/29/21 0718  WBC 19.2* 14.4* 18.5* 19.0*  --   NEUTROABS  --   --  16.3* 17.5*  --   HGB 10.2* 8.3* 8.1* 6.7* 8.3*  HCT 32.2* 25.6* 25.4* 19.9* 25.0*  MCV 89.2 89.2 87.3 84.7  --   PLT 207 156 226 181  --     Basic Metabolic Panel: Recent Labs  Lab 12/27/21 0532 12/28/21 0500 12/28/21 1101 12/29/21 0621 12/29/21 0716  NA 132* 130* 128* 128* 127*  K 4.2 4.2 4.0 2.8* 3.7  CL 105 105 100 89* 97*  CO2 17* 14* 15* 29 18*  GLUCOSE 138* 260* 341* 684* 325*  BUN 64* 67* 66* 51* 57*  CREATININE 3.82* 3.86* 3.70* 2.84* 3.34*  CALCIUM 8.0* 7.7* 7.7* 6.1* 7.4*  MG  --  1.7  --  1.6*  --   PHOS  --  4.5  --  3.6  --    GFR: Estimated Creatinine Clearance: 25.5 mL/min (A) (by C-G formula based on SCr of 3.34 mg/dL (H)). Recent Labs  Lab 12/26/21 2020 12/27/21 0532 12/27/21 1108 12/27/21 1407 12/28/21 0500 12/28/21 1100 12/28/21 1613 12/28/21 2157 12/29/21 0621  PROCALCITON  --   --  1.78  --  1.43  --   --   --  0.67  WBC 19.2* 14.4*  --   --  18.5*  --   --   --  19.0*  LATICACIDVEN 1.7  --   --    < > 2.0* 2.1* 2.2* 2.1* 1.8   < > = values in this interval not displayed.    Liver Function Tests: Recent Labs  Lab 12/26/21 2020 12/27/21 0532  12/29/21 0621 12/29/21 0716  AST 31 28  --  18  ALT 55* 44  --  35  ALKPHOS 204* 167*  --  157*  BILITOT 2.1* 1.5*  --  0.7  PROT 8.1 6.8  --  7.0  ALBUMIN 2.7* 2.2* 1.8* 2.3*   No results for input(s): LIPASE, AMYLASE in the last 168 hours. No results for input(s): AMMONIA in the last 168 hours.  ABG No results found for: PHART, PCO2ART, PO2ART, HCO3, TCO2, ACIDBASEDEF, O2SAT   Coagulation Profile: No results for input(s): INR, PROTIME in the last 168 hours.  Cardiac Enzymes: Recent Labs  Lab 12/26/21 2020  CKTOTAL 10*    HbA1C: Hgb A1c MFr Bld  Date/Time Value Ref Range Status  12/28/2021 05:00 AM 5.9 (H) 4.8 - 5.6 % Final    Comment:    (NOTE)         Prediabetes: 5.7 - 6.4         Diabetes: >6.4         Glycemic control for adults with diabetes: <7.0     CBG: Recent Labs  Lab 12/28/21 1504 12/28/21 2143 12/29/21 0747  GLUCAP 236* 234* 312*    Review of Systems: Positives in BOLD   Gen: Denies fever, chills, weight change, fatigue/malaise, night sweats HEENT: Denies blurred vision, double vision, hearing loss, tinnitus, sinus congestion, rhinorrhea, sore throat, neck stiffness, dysphagia PULM: Denies shortness of breath, cough, sputum production, hemoptysis, wheezing CV: Denies chest pain, edema, orthopnea, paroxysmal nocturnal dyspnea, palpitations GI: Denies abdominal pain, nausea, vomiting, diarrhea, hematochezia, melena, constipation, change in bowel habits GU: Denies dysuria, hematuria, polyuria, oliguria, urethral discharge Endocrine: Denies hot or cold intolerance, polyuria, polyphagia or appetite change Derm: Denies rash, dry skin, scaling or peeling skin change Heme: Denies easy bruising, bleeding, bleeding gums Neuro: Denies headache, numbness, weakness, slurred speech, loss of memory or consciousness  Past Medical History:  He,  has a past medical history of COPD (chronic obstructive pulmonary disease) (HCC) and Hypertension.   Surgical  History:   Past Surgical History:  Procedure Laterality Date   ANKLE SURGERY       Social History:   reports that he has quit smoking. His smoking use included cigarettes. He smoked an average of .5 packs per day. He has never used smokeless tobacco. He reports that he does not currently use alcohol. He reports that he does not currently use drugs.   Family History:  His family history is not on file.   Allergies No Known Allergies   Home Medications  Prior to Admission medications   Medication Sig Start Date End Date Taking? Authorizing Provider  amLODipine (NORVASC) 10 MG tablet Take 10 mg by mouth daily. 04/27/19 10/24/19  [provider]  atorvastatin (LIPITOR) 80 MG tablet Take 80 mg by mouth daily. 12/23/21   [provider]  cefdinir (OMNICEF) 300 MG capsule Take 300 mg by mouth once. 12/23/21   [provider]  pantoprazole (PROTONIX) 40 MG tablet Take 40 mg by mouth 2 (two)  times daily. 12/23/21   [provider]  sertraline (ZOLOFT) 100 MG tablet Take 100 mg by mouth 2 (two) times daily. 08/26/21   [provider]  sertraline (ZOLOFT) 50 MG tablet Take 50 mg by mouth daily. 04/27/19 07/29/19  [provider]  tamsulosin (FLOMAX) 0.4 MG CAPS capsule Take 0.4 mg by mouth daily. 05/05/19   [provider]  tiotropium (SPIRIVA HANDIHALER) 18 MCG inhalation capsule Place 1 capsule into inhaler and inhale daily. 05/15/19 11/11/19  [provider]  traZODone (DESYREL) 50 MG tablet Take 100 mg by mouth at bedtime. 05/15/19 08/13/19  [provider]     Critical care time: 40 minutes    Donell Beers, [place] Pager [phone] (please enter 7 digits) PCCM Consult Pager [phone] (please enter 7 digits)

## 2021-12-29 NOTE — TOC Initial Note (Addendum)
Transition of Care [facility]) - Initial/Assessment Note    Patient Details  Name: Wayne Burns MRN: [id number] Date of Birth: 1956/07/21  [facility]) CM/SW Contact:    Shelbie Hutching, RN Phone Number: 12/29/2021, 3:40 PM  Clinical Narrative:                 RNCM met with patient at the bedside in the ICU.  Patient is alert and oriented and sitting on the side of the bed with his meal tray in front of him. Patient reports that he is glad to be in the hospital and he wants to get better.  He reports that his brother called the law on him to get him here but he knows that is was just out of concern.  Patient lives in a mobile home, he says his brother lives about 100 yards away through the woods.  Patient is independent and he wants to go home at discharge.  He refuses SNF and refuses home health services. Patient does not drive but he uses uber to get around or calls friends.  APS is following.  TOC will cont to follow.  Expected Discharge Plan: Home/Self Care Barriers to Discharge: Continued Medical Work up   Patient Goals and CMS Choice Patient states their goals for this hospitalization and ongoing recovery are:: Patient wants to return to his home of 50 years      Expected Discharge Plan and Services Expected Discharge Plan: Home/Self Care   Discharge Planning Services: CM Consult   Living arrangements for the past 2 months: Mobile Home                 DME Arranged: N/A DME Agency: NA       HH Arranged: NA, Patient Refused [place] Agency: NA        Prior Living Arrangements/Services Living arrangements for the past 2 months: Mobile Home Lives with:: Self Patient language and need for interpreter reviewed:: Yes Do you feel safe going back to the place where you live?: Yes      Need for Family Participation in Patient Care: Yes (Comment) Care giver support system in place?: Yes (comment) (brother)   Criminal Activity/Legal Involvement Pertinent to Current  Situation/Hospitalization: No - Comment as needed  Activities of Daily Living      Permission Sought/Granted Permission sought to share information with : Case Manager, Family Supports Permission granted to share information with : Yes, Verbal Permission Granted        Permission granted to share info w Relationship: Brother     Emotional Assessment Appearance:: Appears stated age Attitude/Demeanor/Rapport: Engaged Affect (typically observed): Accepting Orientation: : Oriented to Self, Oriented to Place, Oriented to  Time, Oriented to Situation Alcohol / Substance Use: Not Applicable Psych Involvement: No (comment)  Admission diagnosis:  Atrial fibrillation with rapid ventricular response (HCC) [I48.91] Acute cystitis without hematuria [N30.00] AKI (acute kidney injury) ([place]) [N17.9] Septic shock ([place]) [A41.9, R65.21] Sepsis ([place]) [A41.9] Sepsis, due to unspecified organism, unspecified whether acute organ dysfunction present [facility]) [A41.9] Patient Active Problem List   Diagnosis Date Noted   Atrial fibrillation with RVR ([place]) 12/27/2021   Ureteral stent displacement, initial encounter ([place]) 12/27/2021   COVID-19 virus infection 12/27/2021   COPD with acute exacerbation ([place]) 12/27/2021   Septic shock ([place]) 12/27/2021   Sepsis ([place]) 12/26/2021   Urinary tract infection 12/26/2021   Involuntary commitment 12/26/2021   AKI (acute kidney injury) ([place]) 12/26/2021   Elevated troponin 12/26/2021  Elevated glucose 12/26/2021   Hydronephrosis, right 05/19/2019   Abnormal LFTs (liver function tests) 04/28/2019   Depression 01/09/2019   Essential hypertension 01/09/2019   PCP:  Jerene Pitch, MD Pharmacy:   [id number] [place], [place] - [address]Phone: [phone]: [phone] [facility] [address] [address] [address]Phone: [phone]: [phone]    Social Determinants of Health (SDOH) Interventions    Readmission Risk Interventions No flowsheet data found.

## 2021-12-29 NOTE — Consult Note (Signed)
PHARMACY CONSULT NOTE  Pharmacy Consult for Electrolyte Monitoring and Replacement   Recent Labs: Potassium (mmol/L)  Date Value  12/29/2021 3.7  01/21/2014 3.7   Magnesium (mg/dL)  Date Value  12/29/2021 1.6 (L)   Calcium (mg/dL)  Date Value  12/29/2021 7.4 (L)   Calcium, Total (mg/dL)  Date Value  01/21/2014 9.0   Albumin (g/dL)  Date Value  12/29/2021 2.3 (L)   Phosphorus (mg/dL)  Date Value  12/29/2021 3.6   Sodium (mmol/L)  Date Value  12/29/2021 127 (L)  01/21/2014 137   Assessment: Patient is a 66 y/o M with medical history including COPD, HTN, chronic urinary retention with bilateral hydronephrosis who is admitted with urosepsis secondary to ESBL E coli bacteremia from bilateral obstructing distal ureteral calculi. Admission complicated by Afib with RVR, AKI, and metabolic acidosis. Pharmacy consulted to assist with electrolyte monitoring and replacement as indicated.  Goal of Therapy:  Electrolytes within normal limits  Plan:  --Renal function improving on bicarbonate gtt. Still with metabolic acidosis --Mg 1.6, magnesium sulfate 2 g IV x 1 --Follow-up electrolytes with AM labs tomorrow  Benita Gutter 12/29/2021 3:24 PM

## 2021-12-29 NOTE — Progress Notes (Signed)
[facility]  ROUNDING NOTE   Subjective:   Renal function was improving as creatinine was down to 2.84 but up to 3.3 today. Hypokalemia improved as serum potassium up to 3.7. 02/23 0701 - 02/24 0700 In: 6010.2 [I.V.:4701.5; IV Piggyback:1308.7] Out: 895 [Urine:895] Lab Results  Component Value Date   CREATININE 3.34 (H) 12/29/2021   CREATININE 2.84 (H) 12/29/2021   CREATININE 3.70 (H) 12/28/2021     Objective:  Vital signs in last 24 hours:  Temp:  [97 F (36.1 C)-98.2 F (36.8 C)] 97 F (36.1 C) (02/24 0800) Pulse Rate:  [43-157] 88 (02/24 0900) Resp:  [15-29] 22 (02/24 0900) BP: (79-133)/(49-95) 104/49 (02/24 0800) SpO2:  [89 %-100 %] 100 % (02/24 0900) Weight:  [92.6 kg] 92.6 kg (02/23 1500)  Weight change:  Filed Weights   12/26/21 2013 12/28/21 1500  Weight: 72.6 kg 92.6 kg    Intake/Output: I/O last 3 completed shifts: In: 6646.9 [I.V.:5338.2; IV Piggyback:1308.7] Out: 895 [Urine:895]   Intake/Output this shift:  Total I/O In: 798.8 [I.V.:787.3; IV Piggyback:11.5] Out: -   Physical Exam: General: Ill-appearing  Head: Normocephalic, atraumatic. Moist oral mucosal membranes  Eyes: Anicteric  Lungs:  Normal effort  Heart: Regular rate and rhythm  Abdomen:  Soft, nontender, obese  Extremities: No peripheral edema.  Neurologic: Nonfocal, moving all four extremities  Skin: No acute skin rash  GU Foley catheter in place    Basic Metabolic Panel: Recent Labs  Lab 12/27/21 0532 12/28/21 0500 12/28/21 1101 12/29/21 0621 12/29/21 0716  NA 132* 130* 128* 128* 127*  K 4.2 4.2 4.0 2.8* 3.7  CL 105 105 100 89* 97*  CO2 17* 14* 15* 29 18*  GLUCOSE 138* 260* 341* 684* 325*  BUN 64* 67* 66* 51* 57*  CREATININE 3.82* 3.86* 3.70* 2.84* 3.34*  CALCIUM 8.0* 7.7* 7.7* 6.1* 7.4*  MG  --  1.7  --  1.6*  --   PHOS  --  4.5  --  3.6  --      Liver Function Tests: Recent Labs  Lab 12/26/21 2020 12/27/21 0532 12/29/21 0621 12/29/21 0716   AST 31 28  --  18  ALT 55* 44  --  35  ALKPHOS 204* 167*  --  157*  BILITOT 2.1* 1.5*  --  0.7  PROT 8.1 6.8  --  7.0  ALBUMIN 2.7* 2.2* 1.8* 2.3*    No results for input(s): LIPASE, AMYLASE in the last 168 hours. No results for input(s): AMMONIA in the last 168 hours.  CBC: Recent Labs  Lab 12/26/21 2020 12/27/21 0532 12/28/21 0500 12/29/21 0621 12/29/21 0718  WBC 19.2* 14.4* 18.5* 19.0*  --   NEUTROABS  --   --  16.3* 17.5*  --   HGB 10.2* 8.3* 8.1* 6.7* 8.3*  HCT 32.2* 25.6* 25.4* 19.9* 25.0*  MCV 89.2 89.2 87.3 84.7  --   PLT 207 156 226 181  --      Cardiac Enzymes: Recent Labs  Lab 12/26/21 2020  CKTOTAL 10*     BNP: Invalid input(s): POCBNP  CBG: Recent Labs  Lab 12/28/21 1504 12/28/21 2143 12/29/21 0747  GLUCAP 236* 234* 312*    Microbiology: Results for orders placed or performed during the hospital encounter of 12/26/21  Resp Panel by RT-PCR (Flu A&B, Covid)     Status: Abnormal   Collection Time: 12/26/21 10:33 PM   Specimen: Nasopharyngeal(NP) swabs in vial transport medium  Result Value Ref Range Status   SARS Coronavirus  2 by RT PCR POSITIVE (A) NEGATIVE Final    Comment: (NOTE) SARS-CoV-2 target nucleic acids are DETECTED.  The SARS-CoV-2 RNA is generally detectable in upper respiratory specimens during the acute phase of infection. Positive results are indicative of the presence of the identified virus, but do not rule out bacterial infection or co-infection with other pathogens not detected by the test. Clinical correlation with patient history and other diagnostic information is necessary to determine patient infection status. The expected result is Negative.  Fact Sheet for Patients: EntrepreneurPulse.com.au  Fact Sheet for Healthcare Providers: IncredibleEmployment.be  This test is not yet approved or cleared by the [place] FDA and  has been authorized for detection and/or diagnosis  of SARS-CoV-2 by FDA under an Emergency Use Authorization (EUA).  This EUA will remain in effect (meaning this test can be used) for the duration of  the COVID-19 declaration under Section 564(b)(1) of the A ct, 21 U.S.C. section 360bbb-3(b)(1), unless the authorization is terminated or revoked sooner.     Influenza A by PCR NEGATIVE NEGATIVE Final   Influenza B by PCR NEGATIVE NEGATIVE Final    Comment: (NOTE) The Xpert Xpress SARS-CoV-2/FLU/RSV plus assay is intended as an aid in the diagnosis of influenza from Nasopharyngeal swab specimens and should not be used as a sole basis for treatment. Nasal washings and aspirates are unacceptable for Xpert Xpress SARS-CoV-2/FLU/RSV testing.  Fact Sheet for Patients: EntrepreneurPulse.com.au  Fact Sheet for Healthcare Providers: IncredibleEmployment.be  This test is not yet approved or cleared by the [place] FDA and has been authorized for detection and/or diagnosis of SARS-CoV-2 by FDA under an Emergency Use Authorization (EUA). This EUA will remain in effect (meaning this test can be used) for the duration of the COVID-19 declaration under Section 564(b)(1) of the Act, 21 U.S.C. section 360bbb-3(b)(1), unless the authorization is terminated or revoked.  Performed at [facility], [address] [place], [place] [address]   Blood culture (routine x 2)     Status: None (Preliminary result)   Collection Time: 12/26/21 10:33 PM   Specimen: BLOOD  Result Value Ref Range Status   Specimen Description BLOOD LEFT ASSIST CONTROL  Final   Special Requests   Final    BOTTLES DRAWN AEROBIC AND ANAEROBIC Blood Culture adequate volume   Culture   Final    NO GROWTH 3 DAYS Performed at [facility] [address], [place], [place] [address]    Report Status PENDING  Incomplete  Blood culture (routine x 2)     Status: Abnormal   Collection Time: 12/26/21 10:46 PM   Specimen:  BLOOD  Result Value Ref Range Status   Specimen Description   Final    BLOOD RIGHT ANTECUBITAL Performed at [facility], [place] [address], [place], [place] [address]    Special Requests   Final    BOTTLES DRAWN AEROBIC AND ANAEROBIC Blood Culture adequate volume Performed at [facility] [address] [place], [place] [address]    Culture  Setup Time   Final    GRAM NEGATIVE RODS ANAEROBIC BOTTLE ONLY Organism ID to follow CRITICAL RESULT CALLED TO, READ BACK BY AND VERIFIED WITH: [facility] MITCHELL [id number] 12/27/21 HNM    Culture (A)  Final    ESCHERICHIA COLI Confirmed Extended Spectrum Beta-Lactamase Producer (ESBL).  In bloodstream infections from ESBL organisms, carbapenems are preferred over piperacillin/tazobactam. They are shown to have a lower risk of mortality.    Report Status 12/29/2021  FINAL  Final   Organism ID, Bacteria ESCHERICHIA COLI  Final      Susceptibility   Escherichia coli - MIC*    AMPICILLIN >=32 RESISTANT Resistant     CEFAZOLIN >=64 RESISTANT Resistant     CEFEPIME 16 RESISTANT Resistant     CEFTAZIDIME RESISTANT Resistant     CEFTRIAXONE >=64 RESISTANT Resistant     CIPROFLOXACIN <=0.25 SENSITIVE Sensitive     GENTAMICIN <=1 SENSITIVE Sensitive     IMIPENEM <=0.25 SENSITIVE Sensitive     TRIMETH/SULFA <=20 SENSITIVE Sensitive     AMPICILLIN/SULBACTAM 8 SENSITIVE Sensitive     PIP/TAZO <=4 SENSITIVE Sensitive     * ESCHERICHIA COLI  Blood Culture ID Panel (Reflexed)     Status: Abnormal   Collection Time: 12/26/21 10:46 PM  Result Value Ref Range Status   Enterococcus faecalis NOT DETECTED NOT DETECTED Final   Enterococcus Faecium NOT DETECTED NOT DETECTED Final   Listeria monocytogenes NOT DETECTED NOT DETECTED Final   Staphylococcus species NOT DETECTED NOT DETECTED Final   Staphylococcus aureus (BCID) NOT DETECTED NOT DETECTED Final   Staphylococcus epidermidis NOT DETECTED NOT DETECTED Final   Staphylococcus lugdunensis NOT  DETECTED NOT DETECTED Final   Streptococcus species NOT DETECTED NOT DETECTED Final   Streptococcus agalactiae NOT DETECTED NOT DETECTED Final   Streptococcus pneumoniae NOT DETECTED NOT DETECTED Final   Streptococcus pyogenes NOT DETECTED NOT DETECTED Final   A.calcoaceticus-baumannii NOT DETECTED NOT DETECTED Final   Bacteroides fragilis NOT DETECTED NOT DETECTED Final   Enterobacterales DETECTED (A) NOT DETECTED Final    Comment: Enterobacterales represent a large order of gram negative bacteria, not a single organism. CRITICAL RESULT CALLED TO, READ BACK BY AND VERIFIED WITH: DEVAN MITCHELL PHARMD 1126 12/27/21 HNM    Enterobacter cloacae complex NOT DETECTED NOT DETECTED Final   Escherichia coli DETECTED (A) NOT DETECTED Final    Comment: CRITICAL RESULT CALLED TO, READ BACK BY AND VERIFIED WITH: DEVAN MITCHELL HALPFX 9024 12/27/21 HNM    Klebsiella aerogenes NOT DETECTED NOT DETECTED Final   Klebsiella oxytoca NOT DETECTED NOT DETECTED Final   Klebsiella pneumoniae NOT DETECTED NOT DETECTED Final   Proteus species NOT DETECTED NOT DETECTED Final   Salmonella species NOT DETECTED NOT DETECTED Final   Serratia marcescens NOT DETECTED NOT DETECTED Final   Haemophilus influenzae NOT DETECTED NOT DETECTED Final   Neisseria meningitidis NOT DETECTED NOT DETECTED Final   Pseudomonas aeruginosa NOT DETECTED NOT DETECTED Final   Stenotrophomonas maltophilia NOT DETECTED NOT DETECTED Final   Candida albicans NOT DETECTED NOT DETECTED Final   Candida auris NOT DETECTED NOT DETECTED Final   Candida glabrata NOT DETECTED NOT DETECTED Final   Candida krusei NOT DETECTED NOT DETECTED Final   Candida parapsilosis NOT DETECTED NOT DETECTED Final   Candida tropicalis NOT DETECTED NOT DETECTED Final   Cryptococcus neoformans/gattii NOT DETECTED NOT DETECTED Final   CTX-M ESBL DETECTED (A) NOT DETECTED Final    Comment: CRITICAL RESULT CALLED TO, READ BACK BY AND VERIFIED WITH: DEVAN MITCHELL  PHARMD 1126 12/27/21 HNM (NOTE) Extended spectrum beta-lactamase detected. Recommend a carbapenem as initial therapy.      Carbapenem resistance IMP NOT DETECTED NOT DETECTED Final   Carbapenem resistance KPC NOT DETECTED NOT DETECTED Final   Carbapenem resistance NDM NOT DETECTED NOT DETECTED Final   Carbapenem resist OXA 48 LIKE NOT DETECTED NOT DETECTED Final   Carbapenem resistance VIM NOT DETECTED NOT DETECTED Final    Comment: Performed at [facility],  [place], [place] [address]  Urine Culture     Status: Abnormal   Collection Time: 12/27/21  9:59 AM   Specimen: Urine, Clean Catch  Result Value Ref Range Status   Specimen Description   Final    URINE, CLEAN CATCH Performed at [facility], [address], [place], [place] [address]    Special Requests   Final    NONE Performed at [facility], [address], [place], [place] [address]    Culture (A)  Final    <10,000 COLONIES/mL INSIGNIFICANT GROWTH Performed at [place] [address], [place], [place] [address]    Report Status 12/28/2021 FINAL  Final  MRSA Next Gen by PCR, Nasal     Status: Abnormal   Collection Time: 12/28/21  3:10 PM   Specimen: Nasal Mucosa; Nasal Swab  Result Value Ref Range Status   MRSA by PCR Next Gen DETECTED (A) NOT DETECTED Final    Comment: RESULT CALLED TO, READ BACK BY AND VERIFIED WITH: GINA SHANNON 1701 12/28/21 MU (NOTE) The GeneXpert MRSA Assay (FDA approved for NASAL specimens only), is one component of a comprehensive MRSA colonization surveillance program. It is not intended to diagnose MRSA infection nor to guide or monitor treatment for MRSA infections. Test performance is not FDA approved in patients less than 86 years old. Performed at [facility], [address] [place], [place] [address]     Coagulation Studies: No results for input(s): LABPROT, INR in the last 72 hours.  Urinalysis: Recent Labs     12/26/21 2020  COLORURINE AMBER*  LABSPEC 1.010  PHURINE 6.0  GLUCOSEU NEGATIVE  HGBUR MODERATE*  BILIRUBINUR NEGATIVE  KETONESUR NEGATIVE  PROTEINUR 100*  NITRITE NEGATIVE  LEUKOCYTESUR LARGE*       Imaging: No results found.   Medications:    sodium chloride     sodium chloride     amiodarone 30 mg/hr (12/29/21 0814)   magnesium sulfate bolus IVPB     meropenem (MERREM) IV 500 mg (12/29/21 0912)   phenylephrine (NEO-SYNEPHRINE) Adult infusion 40 mcg/min (12/29/21 0914)   sodium bicarbonate 150 mEq in D5W infusion 75 mL/hr at 12/29/21 0915    aspirin EC  81 mg Oral Daily   atorvastatin  80 mg Oral Daily   Chlorhexidine Gluconate Cloth  6 each Topical Daily   heparin  5,000 Units Subcutaneous Q12H   insulin aspart  0-15 Units Subcutaneous TID WC   insulin aspart  0-5 Units Subcutaneous QHS   insulin detemir  10 Units Subcutaneous Daily   levalbuterol  2 puff Inhalation Q8H   midodrine  10 mg Oral TID WC   pantoprazole (PROTONIX) IV  40 mg Intravenous Q12H   sodium chloride flush  3 mL Intravenous Q12H   sodium chloride flush  3 mL Intravenous Q12H   tamsulosin  0.4 mg Oral Daily   tiotropium  1 capsule Inhalation Daily   sodium chloride, acetaminophen **OR** acetaminophen, menthol-cetylpyridinium, ondansetron **OR** ondansetron (ZOFRAN) IV, sodium chloride flush  Assessment/ Plan:  Mr. Wayne Burns is a 66 y.o.  male with past medical conditions including COPD and hypertension , who was admitted to [facility] on 12/26/2021 for Atrial fibrillation with rapid ventricular response ([place]) [I48.91] Acute cystitis without hematuria [N30.00] AKI (acute kidney injury) ([employer]) [N17.9] Septic shock ([place]) [A41.9, R65.21] Sepsis ([place]) [A41.9] Sepsis, due to unspecified organism, unspecified whether acute organ dysfunction present ([place]) [A41.9]   Acute kidney injury with chronic kidney  disease stage IV.  Baseline appears to be 2.8 with GFR 25 on 12/22/2021.  AKI may be due to  possible obstruction from migrating stent recently placed at [facility].    Renal function has been fluctuating and actually worse at the moment as creatinine up to 3.3.  Urine output 895 cc noted.  Urology note reviewed.  May require manipulation of stent to place in proper location.  For now continue supportive care.  No immediate need for dialysis.  Lab Results  Component Value Date   CREATININE 3.34 (H) 12/29/2021   CREATININE 2.84 (H) 12/29/2021   CREATININE 3.70 (H) 12/28/2021    Intake/Output Summary (Last 24 hours) at 12/29/2021 0945 Last data filed at 12/29/2021 0814 Gross per 24 hour  Intake 6808.97 ml  Output 895 ml  Net 5913.97 ml    2.  Hypotension.  Currently on amiodarone drip with midodrine schedule III times daily.  Home regimen of amlodipine currently held  Remains on norepinephrine drip.  3.  Acute metabolic acidosis.  Serum bicarbonate also fluctuating.  Continue sodium bicarb and drip for now.   LOS: 2 Janel Beane 2/24/20239:45 AM

## 2021-12-29 NOTE — Progress Notes (Addendum)
Date of Admission:  12/26/2021 Wayne Burns is a 66 y.o. male with chronic urinary retention needing self cath, b/l renal stones, chronic OM of rt ankle, presents to ED from home with IVC  because he was confused defecating  and urinating on himself history of recent hospitalization at [facility] between 12/12/21-12/22/21  for sepsis due to  b/l UPJ stones causing obstruction. He was transferred from [facility] to main [facility] for possible perc nephrostomy. Hospital course at [facility] complicated by Acute hypoxic resp failure needing intubation 9 2/8-2/9) , new HFrEF, MI and upper GI bleed. On presentation his cr was 10 , he had LPCN placed on 2/8 by IR, but exchanged with b/l ureteral stents by urology on 12/14/21. Improvement in cr to 3.5,. which wrosened to 3.64 with US showing worsening hydronephrosis thought to be due to improper self cath by patient  which improved. 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 Started aspirin. He had GI bleed during the stay and upper endoscopy showed old gastric ulcers He also had covid and superimposed PNA and got remdesivir X 5 days and Po dexa for 10 days UC was positive for kleb ( R to quinolone, bactrim, zosyn) and proteus He was discharged on cefdinir Cr on 2/17 on discharge was 2.80     ID: Wayne Burns is a 66 y.o. male  Principal Problem:   Depression Active Problems:   Abnormal LFTs (liver function tests)   Essential hypertension   Sepsis (HCC)   Urinary tract infection   Involuntary commitment   AKI (acute kidney injury) ([place])   Elevated troponin   Elevated glucose   Atrial fibrillation with RVR (HCC)   Ureteral stent displacement, initial encounter ([place])   COVID-19 virus infection   COPD with acute exacerbation (HCC)   Septic shock (HCC)    Subjective: Pt doing better No fever No pain Still on amiodarone infusion And pressor  but looks very stable Medications:   amiodarone  200 mg Oral BID   aspirin EC  81 mg Oral Daily   atorvastatin  80 mg Oral Daily   Chlorhexidine Gluconate Cloth  6 each Topical Daily   feeding supplement (NEPRO CARB STEADY)  237 mL Oral TID BM   heparin  5,000 Units Subcutaneous Q12H   insulin aspart  0-15 Units Subcutaneous TID WC   insulin aspart  0-5 Units Subcutaneous QHS   insulin detemir  10 Units Subcutaneous Daily   levalbuterol  2 puff Inhalation Q8H   midodrine  10 mg Oral TID WC   pantoprazole (PROTONIX) IV  40 mg Intravenous Q12H   sodium chloride flush  3 mL Intravenous Q12H   sodium chloride flush  3 mL Intravenous Q12H   tamsulosin  0.4 mg Oral Daily   tiotropium  1 capsule Inhalation Daily    Objective: Vital signs in last 24 hours: Patient Vitals for the past 24 hrs:  BP Temp Temp src Pulse Resp SpO2 Height Weight  12/29/21 1400 112/82 -- -- 94 18 100 % -- --  12/29/21 1330 100/76 -- -- (!) 103 (!) 27 98 % -- --  12/29/21 1300 (!) 82/72 -- -- (!) 105 20 97 % -- --  12/29/21 1230 96/64 -- -- 99 20 98 % -- --  12/29/21 1200 (!) 101/53 -- -- 94 15 96 % -- --  12/29/21 1130 108/62 -- -- 98 15 96 % -- --  12/29/21 1100 115/69 -- -- (!) 45 18 97 % -- --  12/29/21 1030 104/67 -- -- 93 14 99 % -- --  12/29/21 1000 91/78 -- -- 93 17 100 % -- --  12/29/21 0930 120/63 -- -- 87 (!) 22 98 % -- --  12/29/21 0900 -- -- -- 88 (!) 22 100 % -- --  12/29/21 0800 (!) 104/49 (!) 97 F (36.1 C) Oral 80 17 99 % -- --  12/29/21 0600 113/77 -- -- (!) 49 19 98 % -- --  12/29/21 0515 90/66 -- -- 97 18 99 % -- --  12/29/21 0300 104/69 -- -- (!) 101 (!) 21 90 % -- --  12/29/21 0200 108/70 98.2 F (36.8 C) -- 85 (!) 26 93 % -- --  12/29/21 0135 102/80 -- -- (!) 157 (!) 23 100 % -- --  12/29/21 0000 (!) 96/55 98 F (36.7 C) -- (!) 43 (!) 21 95 % -- --  12/28/21 2300 106/72 -- -- (!) 110 15 100 % -- --  12/28/21 2200 105/68 -- -- 79 16 100 % -- --  12/28/21 2120 (!) 79/56 -- -- 89 15  (!) 89 % -- --  12/28/21 2005 93/64 (!) 97.4 F (36.3 C) -- (!) 49 20 100 % -- --  12/28/21 1900 103/70 -- -- 62 19 99 % -- --  12/28/21 1800 99/69 -- -- (!) 125 16 91 % -- --  12/28/21 1500 122/73 (!) 97.4 F (36.3 C) Oral 81 (!) 23 -- 5\' 9"  (1.753 m) 92.6 kg     PHYSICAL EXAM:  General: Alert, cooperative, no distress, appears stated age.  Back: No CVA tenderness. Lungs: b/l air entry Heart: irregular Abdomen: Soft, non-tender,not distended. Bowel sounds normal. No masses Foley in place Extremities: scar from previous fracture- no discharge  Skin: No rashes or lesions. Or bruising Lymph: Cervical, supraclavicular normal. Neurologic: Grossly non-focal  Lab Results Recent Labs    12/28/21 0500 12/28/21 1101 12/29/21 0621 12/29/21 0716 12/29/21 0718  WBC 18.5*  --  19.0*  --   --   HGB 8.1*  --  6.7*  --  8.3*  HCT 25.4*  --  19.9*  --  25.0*  NA 130*   < > 128* 127*  --   K 4.2   < > 2.8* 3.7  --   CL 105   < > 89* 97*  --   CO2 14*   < > 29 18*  --   BUN 67*   < > 51* 57*  --   CREATININE 3.86*   < > 2.84* 3.34*  --    < > = values in this interval not displayed.   Liver Panel Recent Labs    12/27/21 0532 12/29/21 0621 12/29/21 0716  PROT 6.8  --  7.0  ALBUMIN 2.2* 1.8* 2.3*  AST 28  --  18  ALT 44  --  35  ALKPHOS 167*  --  157*  BILITOT 1.5*  --  0.7   Microbiology: 12/26/21 ESBL e.coli bacteremia Studies/Results:    Assessment/Plan: ESBL e.coli bacteremia with complicated UTI with b/l endorenal stents Urinary retention with thickened urinary bladder- has foley .Air in the wall of the bladder on the rt side due to likely prior  stent malposition Discussed with Dr.Stoioff- because of bacteremia, new ESBL infection stents need to be changed- He is doing it next tuesday On meropenem--will need atleast 2 weeks after exchange of stent. May need more  depending on whether air is still seen in the bladder wall  AKI on CKD  Afib on amiodarone drip Also  needing pressor   Recent b/l endoureteral stents for PUJ stone with obstruction   Had klebsiella and proteus in urine culture 2/7   Recent Acute MI Recent hypoxic resp failure needing intubation for 24 hrs Recent COVID lung disease treated with remdisivir and dexa at [facility]   Recent UGIB  H/o fracture rt leg  with ORIF- and plate removal except 2 screws - says he has had intermittent discharge - could be chronic fistula/osteo none seen since admission-    Discussed the management with patient /care team On-call ID available this weekend remotely.  Call if needed. From 01/01/22  [facility] physicians will be covering [facility] while I am away

## 2021-12-29 NOTE — Progress Notes (Signed)
An USGPIV (ultrasound guided PIV) has been placed for short-term vasopressor infusion. A correctly placed ivWatch must be used when administering Vasopressors. Should this treatment be needed beyond 72 hours, central line access should be obtained.  It will be the responsibility of the bedside nurse to follow best practice to prevent extravasations.   ?

## 2021-12-30 DIAGNOSIS — R6521 Severe sepsis with septic shock: Secondary | ICD-10-CM | POA: Diagnosis not present

## 2021-12-30 DIAGNOSIS — R6520 Severe sepsis without septic shock: Secondary | ICD-10-CM

## 2021-12-30 DIAGNOSIS — A419 Sepsis, unspecified organism: Secondary | ICD-10-CM | POA: Diagnosis not present

## 2021-12-30 DIAGNOSIS — U071 COVID-19: Secondary | ICD-10-CM | POA: Diagnosis not present

## 2021-12-30 LAB — BLOOD CULTURE ID PANEL (REFLEXED) - BCID2

## 2021-12-30 LAB — CBC WITH DIFFERENTIAL/PLATELET
Abs Immature Granulocytes: 0.04 10*3/uL (ref 0.00–0.07)
Basophils Absolute: 0 10*3/uL (ref 0.0–0.1)
Basophils Relative: 0 %
Eosinophils Absolute: 0 10*3/uL (ref 0.0–0.5)
Eosinophils Relative: 1 %
HCT: 20.5 % — ABNORMAL LOW (ref 39.0–52.0)
Hemoglobin: 6.8 g/dL — ABNORMAL LOW (ref 13.0–17.0)
Immature Granulocytes: 1 %
Lymphocytes Relative: 12 %
Lymphs Abs: 0.8 10*3/uL (ref 0.7–4.0)
MCH: 28 pg (ref 26.0–34.0)
MCHC: 33.2 g/dL (ref 30.0–36.0)
MCV: 84.4 fL (ref 80.0–100.0)
Monocytes Absolute: 0.2 10*3/uL (ref 0.1–1.0)
Monocytes Relative: 3 %
Neutro Abs: 5.8 10*3/uL (ref 1.7–7.7)
Neutrophils Relative %: 83 %
Platelets: 127 10*3/uL — ABNORMAL LOW (ref 150–400)
RBC: 2.43 MIL/uL — ABNORMAL LOW (ref 4.22–5.81)
RDW: 18 % — ABNORMAL HIGH (ref 11.5–15.5)
WBC: 6.9 10*3/uL (ref 4.0–10.5)
nRBC: 0 % (ref 0.0–0.2)

## 2021-12-30 LAB — RENAL FUNCTION PANEL
Albumin: 2.1 g/dL — ABNORMAL LOW (ref 3.5–5.0)
Anion gap: 12 (ref 5–15)
BUN: 59 mg/dL — ABNORMAL HIGH (ref 8–23)
CO2: 23 mmol/L (ref 22–32)
Calcium: 7.7 mg/dL — ABNORMAL LOW (ref 8.9–10.3)
Chloride: 96 mmol/L — ABNORMAL LOW (ref 98–111)
Creatinine, Ser: 3.18 mg/dL — ABNORMAL HIGH (ref 0.61–1.24)
GFR, Estimated: 21 mL/min — ABNORMAL LOW (ref >60)
Glucose, Bld: 112 mg/dL — ABNORMAL HIGH (ref 70–99)
Phosphorus: 3.7 mg/dL (ref 2.5–4.6)
Potassium: 3.6 mmol/L (ref 3.5–5.1)
Sodium: 131 mmol/L — ABNORMAL LOW (ref 135–145)

## 2021-12-30 LAB — GLUCOSE, CAPILLARY
Glucose-Capillary: 120 mg/dL — ABNORMAL HIGH (ref 70–99)
Glucose-Capillary: 161 mg/dL — ABNORMAL HIGH (ref 70–99)
Glucose-Capillary: 129 mg/dL — ABNORMAL HIGH (ref 70–99)
Glucose-Capillary: 92 mg/dL (ref 70–99)

## 2021-12-30 LAB — HEMOGLOBIN AND HEMATOCRIT, BLOOD
HCT: 22.6 % — ABNORMAL LOW (ref 39.0–52.0)
Hemoglobin: 7.6 g/dL — ABNORMAL LOW (ref 13.0–17.0)

## 2021-12-30 LAB — ABO/RH: ABO/RH(D): O POS

## 2021-12-30 LAB — MAGNESIUM: Magnesium: 2 mg/dL (ref 1.7–2.4)

## 2021-12-30 MED ORDER — SODIUM CHLORIDE 0.9 % IV SOLN
1.0000 g | Freq: Two times a day (BID) | INTRAVENOUS | Status: AC
  Administered 2021-12-30 – 2022-01-03 (×9): 1 g via INTRAVENOUS
  Filled 2021-12-30: qty 1
  Filled 2021-12-30 (×8): qty 20
  Filled 2021-12-30: qty 1
  Filled 2021-12-30: qty 20

## 2021-12-30 MED ORDER — NEPRO/CARBSTEADY PO LIQD: 237.0000 mL | Freq: Two times a day (BID) | ORAL | Status: DC

## 2021-12-30 MED ORDER — PROSOURCE PLUS PO LIQD
30.0000 mL | Freq: Two times a day (BID) | ORAL | Status: DC
  Administered 2021-12-30: 30 mL via ORAL
  Filled 2021-12-30 (×5): qty 30

## 2021-12-30 MED ORDER — ADULT MULTIVITAMIN W/MINERALS CH
1.0000 | ORAL_TABLET | Freq: Every day | ORAL | Status: DC
  Administered 2021-12-30 – 2022-01-14 (×16): 1 via ORAL
  Filled 2021-12-30 (×16): qty 1

## 2021-12-30 MED ORDER — SODIUM CHLORIDE 0.9 % IV SOLN
200.0000 mg | Freq: Once | INTRAVENOUS | Status: AC
  Administered 2021-12-30: 200 mg via INTRAVENOUS
  Filled 2021-12-30: qty 200

## 2021-12-30 MED ORDER — SODIUM CHLORIDE 0.9 % IV SOLN
100.0000 mg | INTRAVENOUS | Status: AC
  Administered 2021-12-31 – 2022-01-14 (×15): 100 mg via INTRAVENOUS
  Filled 2021-12-30 (×15): qty 100

## 2021-12-30 NOTE — Progress Notes (Signed)
[facility]    SUBJECTIVE: Patient states he feels better denies any chest pain no blocks or syncope he normally goes to [facility] came in with urosepsis but is improving blood pressure is much better   Vitals:   12/30/21 0600 12/30/21 0605 12/30/21 0610 12/30/21 0700  BP:  (!) 87/64 92/61 101/64  Pulse:  (!) 106 (!) 114 (!) 103  Resp: 20  16 (!) 23  Temp:      TempSrc:      SpO2:  98% 100% 98%  Weight:      Height:         Intake/Output Summary (Last 24 hours) at 12/30/2021 1006 Last data filed at 12/30/2021 0700 Gross per 24 hour  Intake 2116.38 ml  Output 2155 ml  Net -38.62 ml      PHYSICAL EXAM  General: Well developed, well nourished, in no acute distress HEENT:  Normocephalic and atramatic Neck:  No JVD.  Lungs: Clear bilaterally to auscultation and percussion. Heart: Irregular irregular. Normal S1 and S2 without gallops or murmurs.  Abdomen: Bowel sounds are positive, abdomen soft and non-tender  Msk:  Back normal, normal gait. Normal strength and tone for age. Extremities: No clubbing, cyanosis or edema.   Neuro: Alert and oriented X 3. Psych:  Good affect, responds appropriately   LABS: Basic Metabolic Panel: Recent Labs    12/29/21 0621 12/29/21 0716 12/30/21 0624  NA 128* 127* 131*  K 2.8* 3.7 3.6  CL 89* 97* 96*  CO2 29 18* 23  GLUCOSE 684* 325* 112*  BUN 51* 57* 59*  CREATININE 2.84* 3.34* 3.18*  CALCIUM 6.1* 7.4* 7.7*  MG 1.6*  --  2.0  PHOS 3.6  --  3.7   Liver Function Tests: Recent Labs    12/29/21 0716 12/30/21 0624  AST 18  --   ALT 35  --   ALKPHOS 157*  --   BILITOT 0.7  --   PROT 7.0  --   ALBUMIN 2.3* 2.1*   No results for input(s): LIPASE, AMYLASE in the last 72 hours. CBC: Recent Labs    12/29/21 0621 12/29/21 0718 12/30/21 0624 12/30/21 0756  WBC 19.0*  --  6.9  --   NEUTROABS 17.5*  --  5.8  --   HGB 6.7*   < > 6.8* 7.6*  HCT 19.9*   < > 20.5* 22.6*  MCV 84.7  --  84.4  --   PLT 181  --  127*  --    < > =  values in this interval not displayed.   Cardiac Enzymes: No results for input(s): CKTOTAL, CKMB, CKMBINDEX, TROPONINI in the last 72 hours. BNP: Invalid input(s): POCBNP D-Dimer: No results for input(s): DDIMER in the last 72 hours. Hemoglobin A1C: Recent Labs    12/28/21 0500  HGBA1C 5.9*   Fasting Lipid Panel: No results for input(s): CHOL, HDL, LDLCALC, TRIG, CHOLHDL, LDLDIRECT in the last 72 hours. Thyroid Function Tests: No results for input(s): TSH, T4TOTAL, T3FREE, THYROIDAB in the last 72 hours.  Invalid input(s): FREET3 Anemia Panel: No results for input(s): VITAMINB12, FOLATE, FERRITIN, TIBC, IRON, RETICCTPCT in the last 72 hours.  No results found.   Echo previous echo reduced left ventricular function 25 to 30   TELEMETRY: Atrial fibrillation rate of 90:  ASSESSMENT AND PLAN:  Principal Problem:   Depression Active Problems:   Abnormal LFTs (liver function tests)   Essential hypertension   Sepsis ([place])   Urinary tract infection   Involuntary  commitment   AKI (acute kidney injury) ([place])   Elevated troponin   Elevated glucose   Atrial fibrillation with RVR (HCC)   Ureteral stent displacement, initial encounter ([place])   COVID-19 virus infection   COPD with acute exacerbation ([place])   Septic shock ([place])    Plan Atrial fibrillation new onset rate still about 90 but reasonably controlled continue conservative therapy amiodarone anticoagulation once cleared by GI with Eliquis or Coumadin 2 multivessel coronary disease continue medical therapy currently on aspirin statin amlodipine being held because of hypotension consider ARB or ACE 3 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 appears to be ischemic recommend continue medical therapy recommend ARB ACE or Entresto beta-blocker consider spironolactone if renal function allows referral to heart failure clinic 4 acute on chronic renal sufficiency consider nephrology input for management and care Will wait for nephrology  to okay ACE ARB or Entresto we will consider imdur hydralazine in the interim once blood pressure improves 5 anemia unclear etiology consider evaluation follow-up by either GI or hematology once the patient is on anticoagulation for A-fib we will then consider discontinuing aspirin 6 GERD consider omeprazole Protonix to help with GERD and previous gastric ulcers 7 sepsis related to urosepsis E. coli bacteriuria continue urological input status post urological stents 8 continue conservative cardiac therapy at this point amiodarone transition from IV to p.o. for atrial fibrillation still in A-fib currently   Wayne Kida, MD 12/30/2021 10:06 AM

## 2021-12-30 NOTE — Progress Notes (Signed)
NAMELimmie Burns, MRN:  [id number], DOB:  Nov 13, 1955, LOS: 3 ADMISSION DATE:  12/26/2021, CONSULTATION DATE:  12/27/2021 REFERRING MD:  Dr. Tawanna Solo, CHIEF COMPLAINT:  Septic shock   Brief Pt Description / Synopsis:  66 year old male admitted with septic shock due to severe urosepsis and ESBL E.Coli BACTEREMIA from bilateral obstructing distal ureteral calculi, along with new onset atrial fibrillation with RVR, acute kidney injury, and metabolic acidosis.  Cardiology, urology, nephrology following.  History of Present Illness:  Wayne Burns is a 66 year old male with a past medical history significant for COPD, hypertension, chronic urinary retention with bilateral hydronephrosis dating back to 2020 who was brought to [place] ED on 12/26/2021 due to family concerns that he was not being able to take care of himself with poor living conditions, refusing to go to hospital, and felt to be a danger to himself.  He was IVC by his family members.  Of note he was recently hospitalized at [facility] for 10 days (12/12/21-12/22/21) for treatment of AKI secondary to bilateral obstructing distal ureteral calculi.  Hospital course was complicated by type II coronary artery disease, sepsis, renal failure, respiratory failure, and melena.  Underwent EGD on 2/16 with evidence of prior gastric ulcers but no acute bleeding. Records were reviewed in [facility] with summary as follows:  Admitted 12/12/2021 with AKI secondary to bilateral obstructing distal ureteral calculi.  Creatinine was 10.  CT showed severe right hydronephrosis with a 0.7 cm obstructing stone at the right distal ureter/UVJ. Moderate to severe left hydronephrosis with a 0.8 cm obstructing stone at the left distal ureter/UVJ. Severe bilateral periureteral/perinephric stranding with trace fluid tracking along the bilateral anterior renal fascia.  Bilateral PCN placement initially recommended.  Placement of left PCN was successful however right PCN unable to be placed  secondary to patient agitation.  After PCN placement he was transferred to CCU and was ultimately intubated.  Due to critical condition additional procedures were deferred until there was clinical improvement. Underwent cystoscopy with bilateral ureteral stent placement 12/14/2013.  There was difficulty placing the right ureteral stent secondary to J hooking of the right proximal ureter after initial stent placement there was distal migration.  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  Left ureteral stent was placed without problems and left PCN was removed.  Urology plan was definitive stone treatment within the next 3 months after cardiac clearance After PCN placement ECG demonstrated new ST elevation in the inferior leads.  He was thought to have a type II event related to coronary artery disease, sepsis, renal failure and respiratory failure.  He was to follow-up with cardiology after discharge to discuss complex PCI in the near future During his hospitalization he was also found to have melena and underwent EGD 2/16 with evidence of possible prior gastric ulcers but no evidence of recent bleeding Positive COVID test 12/12/2021 with symptoms starting 11/29/2021 he received 5 days remdesivir and dexamethasone for 10 days starting on 2/8  ED Course: Initial vital signs: Temperature 98.2 F orally, respiratory rate 27, pulse 126, blood pressure 90/54, pulse ox 94% on room air Significant labs: Sodium 131, bicarbonate 18, glucose 156, BUN 65, creatinine 3.63, calcium 8.7, anion gap 13, alkaline phosphatase 204, albumin 2.7, AST 31, ALT 55, total bilirubin 2.1, CK10, high-sensitivity troponin 67, lactic acid 1.7, WBC 19.2, hemoglobin 10.2, hematocrit 32.2, serum acetaminophen less than 10, salicylates less than 7, ethyl alcohol less than 10 COVID-19 PCR  positive Urinalysis consistent with UTI Urine drug screen negative Imaging: Chest  x-ray>>Stable heart size allowing for differences in technique. Unchanged mediastinal contours. Aortic atherosclerosis and coronary stent. Mild hyperinflation and bronchial thickening, chronic. No acute airspace disease. No pulmonary edema. No pleural effusion or pneumothorax no acute osseous findings. CT abdomen pelvis without contrast>>IMPRESSION: 1. Bilateral endo ureteral stents, with a malpositioned right-sided endo ureteral stent, as described above. Surgical an urology consultation is recommended. 2. Marked severity diffuse urinary bladder wall thickening with air suspected within the lateral aspect of the bladder wall on the right which may represent sequelae associated with cystitis. The presence of an underlying neoplastic process cannot be excluded. 3. Renal calculi within the right renal pelvis with numerous subcentimeter non-obstructing left renal calculi. 4. Splenomegaly. 5. Aortic atherosclerosis.  He met sepsis criteria therefore he was given IV fluids and broad-spectrum antibiotics.  He was admitted by the hospitalist.  Cardiology, nephrology, and urology were consulted  While boarding in the ED and waiting for bed placement on 2/22, he became hypotensive concerning for developing septic shock.  PCCM was consulted due to potential need for vasopressors.  Pertinent  Medical History  COPD Hypertension  Micro Data:  2/21: SARS-CoV-2 PCR>>Positive 2/21: Influenza PCR>> negative 2/21: Blood culture>> ESBL E. coli 2/21: Urine>> less than 10,000 colonies/ml insignificant growth  2/23: MRSA PCR>>positive 2/24: Blood cultures >> NGTD  Antimicrobials:  Cefepime 2/21 x1 dose Ceftriaxone 2/21>>2/22 Meropenem 2/22>>  Significant Hospital Events: Including procedures, antibiotic start and stop dates in addition to other pertinent events   2/21: Admitted by hospitalist.  Cardiology, nephrology, urology consulted. 2/22: Developing septic shock requiring vasopressors, PCCM  consulted.  Blood cultures positive for ESBL E. Coli.  Antibiotics changed to meropenem 2/23: Pt remains in the ER pending bed availability.  Currently requiring neo-synephrine gtt '@65'  mg/min to maintain map >65 and amiodarone gtt '@30'  mg/hr  2/24: Pt remains on amiodarone gtt and neo-synephrine gtt attempting to wean neo-synephrine gtt off 2/25: Weaned off Vasopressors, A.fib rate controlled on PO amiodarone.  Repeat blood cultures pending.   Interim History / Subjective:  -No significant events noted overngiht -Afebrile, hemodynamically stable, Vasopressors weaned off, HR controlled on PO amiodarone -Creatinine slightly improved 3.18 (3.34), UOP 2.1 L last 24 hrs (net + 6.7 L since admit) -Hyponatremia improving , Na 131 (127) -Acidosis resolved, Bicarb 23 (18), consider d/c Bicarb gtt ~ will defer to Nephrology -Hgb 6.8 ~ will repeat H&H to confirm ~ repeat Hgb 7.6 -Pt reports he is "feeling better", currently denies all complaints  Objective   Blood pressure 101/64, pulse (!) 103, temperature (!) 97.5 F (36.4 C), temperature source Oral, resp. rate (!) 23, height '5\' 9"'  (1.753 m), weight 92.6 kg, SpO2 98 %.        Intake/Output Summary (Last 24 hours) at 12/30/2021 0748 Last data filed at 12/30/2021 0700 Gross per 24 hour  Intake 3657.32 ml  Output 2155 ml  Net 1502.32 ml    Filed Weights   12/26/21 2013 12/28/21 1500  Weight: 72.6 kg 92.6 kg   Examination: General: 66 yo male resting in recliner, NAD on RA  HENT: Atraumatic, normocephalic, neck supple, no JVD Lungs: Clear throughout, even, non labored  Cardiovascular: Irregular irregular, rate controlled, no R/G, 2+ radial/1+ distal pulses, no edema  Abdomen: +BS x4, soft, obese, non tender, non distended  Extremities: Normal bulk and tone, previous surgery to right shin and foot Neuro: Alert and oriented, following commands, no focal deficits, PERRLA  GU: Foley  catheter in place  Resolved Hospital Problem list   Mildly  elevated LFT's Septic Shock Atrial fibrillation w/ RVR  Assessment & Plan:   Septic Shock ~ RESOLVED New onset Atrial fibrillation w/ RVR~rate controlled  Mildly elevated Troponin, suspect demand ischemia~peaked at 70    Limited Echo 02/8 at [facility] revealed EF 25 to 30% with grade III diastolic dysfunction  PMHx: Hypertension, CAD -Continuous cardiac monitoring -Maintain MAP >65 -Vasopressors as needed to maintain MAP goal ~ currently weaned off -Continue Midodrine -Lactic acid normalized (1.7 ~ 1.1) -Cardiology following, appreciate input -Continue Amiodarone as per Cardiology -Will defer anticoagulation to cardiology, CHA2DS2-VASc is 1 -Hold home amlodipine  Severe Urosepsis & ESBL E. coli BACTEREMIA due to bilateral obstructing distal ureteral calculi (Right ureteral stent displacement) COVID-19 infection (asymptomatic) -Monitor fever curve -Trend WBC's & Procalcitonin -Follow cultures as above -ID following, appreciate input: continue empiric Meropenem pending cultures & sensitivities (per ID pt will need a minimum of 2 weeks IV abx therapy) -Urology is following, appreciate input ~stents placed at [facility] previously, noted to have proximal migration of the right ureteral stent (remains well above the distal ureter) without significant hydronephrosis on CT.  Tentative plan For stent revision on Tuesday 28th February  Acute kidney injury with chronic kidney disease stage IV~baseline creatinine 2.8 with GFR 25 Hyponatremia ~ IMPROVING Anion Gap Metabolic Acidosis~RESOLVED -Monitor I&O's / urinary output -Follow BMP -Ensure adequate renal perfusion -Avoid nephrotoxic agents as able -Replace electrolytes as indicated -Nephrology following, appreciate input -Consider discontinuing sodium bicarb gtt ~ will defer to Nephrology  Acute Metabolic Encephalopathy in setting of severe sepsis and multiple metabolic derangements~resolving  -Treat metabolic derangements and infection as  above -Provide supportive care -Encourage normal sleep/wake cycle -Avoid sedating medications as able -Psych consulted, pt deemed competent to make medical decisions and DOES NOT require IVC or psychiatric inpatient hospitalization   Normocytic Normochromic Anemia without overt s/sx of blood loss Thrombocytopenia PMHx: Recent Melena at [facility] with EGD revealing gastric ulcers with no evidence acute bleeding -Monitor for S/Sx of bleeding -Trend CBC -Heparin SQ for VTE Prophylaxis  -Transfuse for Hgb <7 -Transfuse platelets for plt count <50 K with active bleeding -Smear with normal platelets -PPI BID  COPD without acute exacerbation COVID-19 infection (asymptomatic) -Supplemental O2 as needed to maintain O2 sats 88 to 92% -Follow intermittent chest x-ray and ABG as needed -Bronchodilators -IV steroids stopped 02/24 -Continue Spiriva  Hyperglycemia ~ IMPROVING -CBG's q4h; Target range of 140 to 180 -Started long acting insulin and changed SSI to moderate scale -Diabetes coordinator consulted, appreciate input -IV steroids discontinued 02/24 -Follow ICU Hypo/Hyperglycemia protocol  Best Practice (right click and "Reselect all SmartList Selections" daily)   Diet/type: Regular consistency (see orders) DVT prophylaxis: prophylactic heparin  GI prophylaxis: PPI Lines: N/A Foley:  Yes, and it is still needed Code Status:  full code Last date of multidisciplinary goals of care discussion [12/30/2021]  Labs   CBC: Recent Labs  Lab 12/26/21 2020 12/27/21 0532 12/28/21 0500 12/29/21 0621 12/29/21 0718 12/30/21 0624  WBC 19.2* 14.4* 18.5* 19.0*  --  6.9  NEUTROABS  --   --  16.3* 17.5*  --  5.8  HGB 10.2* 8.3* 8.1* 6.7* 8.3* 6.8*  HCT 32.2* 25.6* 25.4* 19.9* 25.0* 20.5*  MCV 89.2 89.2 87.3 84.7  --  84.4  PLT 207 156 226 181  --  127*     Basic Metabolic Panel: Recent Labs  Lab 12/28/21 0500 12/28/21 1101 12/29/21 2620 12/29/21 0716 12/30/21 3559  NA 130* 128*  128* 127* 131*  K 4.2 4.0 2.8* 3.7 3.6  CL 105 100 89* 97* 96*  CO2 14* 15* 29 18* 23  GLUCOSE 260* 341* 684* 325* 112*  BUN 67* 66* 51* 57* 59*  CREATININE 3.86* 3.70* 2.84* 3.34* 3.18*  CALCIUM 7.7* 7.7* 6.1* 7.4* 7.7*  MG 1.7  --  1.6*  --  2.0  PHOS 4.5  --  3.6  --  3.7    GFR: Estimated Creatinine Clearance: 26.7 mL/min (A) (by C-G formula based on SCr of 3.18 mg/dL (H)). Recent Labs  Lab 12/27/21 0532 12/27/21 1108 12/27/21 1407 12/28/21 0500 12/28/21 1100 12/28/21 1613 12/28/21 2157 12/29/21 0621 12/30/21 0624  PROCALCITON  --  1.78  --  1.43  --   --   --  0.67  --   WBC 14.4*  --   --  18.5*  --   --   --  19.0* 6.9  LATICACIDVEN  --   --    < > 2.0* 2.1* 2.2* 2.1* 1.8  --    < > = values in this interval not displayed.     Liver Function Tests: Recent Labs  Lab 12/26/21 2020 12/27/21 0532 12/29/21 0621 12/29/21 0716 12/30/21 0624  AST 31 28  --  18  --   ALT 55* 44  --  35  --   ALKPHOS 204* 167*  --  157*  --   BILITOT 2.1* 1.5*  --  0.7  --   PROT 8.1 6.8  --  7.0  --   ALBUMIN 2.7* 2.2* 1.8* 2.3* 2.1*    No results for input(s): LIPASE, AMYLASE in the last 168 hours. No results for input(s): AMMONIA in the last 168 hours.  ABG No results found for: PHART, PCO2ART, PO2ART, HCO3, TCO2, ACIDBASEDEF, O2SAT   Coagulation Profile: No results for input(s): INR, PROTIME in the last 168 hours.  Cardiac Enzymes: Recent Labs  Lab 12/26/21 2020  CKTOTAL 10*     HbA1C: Hgb A1c MFr Bld  Date/Time Value Ref Range Status  12/28/2021 05:00 AM 5.9 (H) 4.8 - 5.6 % Final    Comment:    (NOTE)         Prediabetes: 5.7 - 6.4         Diabetes: >6.4         Glycemic control for adults with diabetes: <7.0     CBG: Recent Labs  Lab 12/29/21 0747 12/29/21 1154 12/29/21 1603 12/29/21 2041 12/30/21 0740  GLUCAP 312* 283* 246* 161* 120*     Review of Systems: Positives in BOLD   Gen: Denies fever, chills, weight change, fatigue/malaise, night  sweats HEENT: Denies blurred vision, double vision, hearing loss, tinnitus, sinus congestion, rhinorrhea, sore throat, neck stiffness, dysphagia PULM: Denies shortness of breath, cough, sputum production, hemoptysis, wheezing CV: Denies chest pain, edema, orthopnea, paroxysmal nocturnal dyspnea, palpitations GI: Denies abdominal pain, nausea, vomiting, diarrhea, hematochezia, melena, constipation, change in bowel habits GU: Denies dysuria, hematuria, polyuria, oliguria, urethral discharge Endocrine: Denies hot or cold intolerance, polyuria, polyphagia or appetite change Derm: Denies rash, dry skin, scaling or peeling skin change Heme: Denies easy bruising, bleeding, bleeding gums Neuro: Denies headache, numbness, weakness, slurred speech, loss of memory or consciousness  Past Medical History:  He,  has a past medical history of COPD (chronic obstructive pulmonary disease) (HCC) and Hypertension.   Surgical History:   Past Surgical History:  Procedure Laterality Date   ANKLE SURGERY  Social History:   reports that he has quit smoking. His smoking use included cigarettes. He smoked an average of .5 packs per day. He has never used smokeless tobacco. He reports that he does not currently use alcohol. He reports that he does not currently use drugs.   Family History:  His family history is not on file.   Allergies No Known Allergies   Home Medications  Prior to Admission medications   Medication Sig Start Date End Date Taking? Authorizing Provider  amLODipine (NORVASC) 10 MG tablet Take 10 mg by mouth daily. 04/27/19 10/24/19  [provider]  atorvastatin (LIPITOR) 80 MG tablet Take 80 mg by mouth daily. 12/23/21   [provider]  cefdinir (OMNICEF) 300 MG capsule Take 300 mg by mouth once. 12/23/21   [provider]  pantoprazole (PROTONIX) 40 MG tablet Take 40 mg by mouth 2 (two) times daily. 12/23/21   [provider]  sertraline (ZOLOFT) 100  MG tablet Take 100 mg by mouth 2 (two) times daily. 08/26/21   [provider]  sertraline (ZOLOFT) 50 MG tablet Take 50 mg by mouth daily. 04/27/19 07/29/19  [provider]  tamsulosin (FLOMAX) 0.4 MG CAPS capsule Take 0.4 mg by mouth daily. 05/05/19   [provider]  tiotropium (SPIRIVA HANDIHALER) 18 MCG inhalation capsule Place 1 capsule into inhaler and inhale daily. 05/15/19 11/11/19  [provider]  traZODone (DESYREL) 50 MG tablet Take 100 mg by mouth at bedtime. 05/15/19 08/13/19  [provider]     Critical care time: 40 minutes    Darel Hong, AGACNP-BC [facility] [place] epic messenger for cross cover needs If after hours, please call E-link

## 2021-12-30 NOTE — Progress Notes (Signed)
Pharmacy Antibiotic Note  Wayne Burns is a 66 y.o. male admitted on 12/26/2021 with bacteremia.  Pharmacy has been consulted for meropenem dosing. Recent hospital stay at [facility] (left AMA), unable to care for himself at home.  Too weak to self-cath.  History of urinary retention. At [facility] had bilateral ureteral stents for obstructing stones.    2/21 Blood cx: ESBL E Coli Renal: SCr 3.18, est CrCl = 26.7 ml/min  Plan: Adjust Meropenem from 500mg  IV q12h to 1 gm q12h for Crcl 26.7 ml/min Follow culture and possibility for OPAT Monitor renal function   Height: 5\' 9"  (175.3 cm) Weight: 92.6 kg (204 lb 2.3 oz) IBW/kg (Calculated) : 70.7  Temp (24hrs), Avg:97.3 F (36.3 C), Min:96.6 F (35.9 C), Max:97.6 F (36.4 C)  Recent Labs  Lab 12/26/21 2020 12/27/21 0532 12/27/21 1407 12/28/21 0500 12/28/21 1100 12/28/21 1101 12/28/21 1613 12/28/21 2157 12/29/21 0621 12/29/21 0716 12/30/21 0624  WBC 19.2* 14.4*  --  18.5*  --   --   --   --  19.0*  --  6.9  CREATININE 3.63* 3.82*  --  3.86*  --  3.70*  --   --  2.84* 3.34* 3.18*  LATICACIDVEN 1.7  --    < > 2.0* 2.1*  --  2.2* 2.1* 1.8  --   --    < > = values in this interval not displayed.     Estimated Creatinine Clearance: 26.7 mL/min (A) (by C-G formula based on SCr of 3.18 mg/dL (H)).    No Known Allergies  Antimicrobials this admission: Cefepime 2/21 x1 Meropenem 2/22 >>  Dose adjustments this admission: 2/25 mero 500mg  q12h to 1 gm q12h  Microbiology results: 2/21 BCx: ESBL E Coli 2/22 UCx:     Thank you for allowing pharmacy to be a part of this patients care.  Chinita Greenland PharmD Clinical Pharmacist 12/30/2021

## 2021-12-30 NOTE — Progress Notes (Signed)
Initial Nutrition Assessment  DOCUMENTATION CODES:   Obesity unspecified  INTERVENTION:   -Decrease Nepro Shake po to BID, each supplement provides 425 kcal and 19 grams protein  -30 ml Prosource Plus TID, each supplement provides 100 kcals and 15 grams protein -MVI with minerals daily  NUTRITION DIAGNOSIS:   Increased nutrient needs related to acute illness (+COVID-19) as evidenced by estimated needs.  GOAL:   Patient will meet greater than or equal to 90% of their needs  MONITOR:   PO intake, Supplement acceptance, Labs, Weight trends, Skin, I & O's  REASON FOR ASSESSMENT:   Malnutrition Screening Tool    ASSESSMENT:   Wayne Burns is a 66 y.o. male seen in the emergency room today for urinary tract infection, sepsis.  Patient is unable to give a clear HPI he is tachycardic and review of systems as well is difficult to obtain.  Initially brought by EMS today patient was IVC by family members for not being able to take care of himself and living in poor conditions refusing to go to the hospital and was deemed a danger to himself.  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  Patient on my evaluation is alert oriented but tachycardic perseverates with answering questions and is not able to clearly give me history.  I would even say patient is mildly disoriented.  When asked about his heart history he initially says no then he says he had blockages.  When asked how he knows about the blockages he said he had been tested and I said did you ever have a cardiac cath he said yes.  I am unable to verify this information from [facility] epic system due to lack of access.  Patient appears disheveled unkempt head soiled himself with stool and urine on initial evaluation.  The emergency room patient meets sepsis criteria.  Pt admitted with sepsis and UTI.   Reviewed I/O's: +1.5 L x 24 hours and +6.8 L since  admission  UOP: 2.2 L x 24 hours   Pt unavailable at time of visit. Attempted to speak with pt via call to hospital room phone, however, unable to reach. RD unable to obtain further nutrition-related history or complete nutrition-focused physical exam at this time.    Pt with variable intake. Noted meal completions 15-100%.   Reviewed wt hx; no wt loss noted.   Pt IVC'd due to poor living conditions and inability to care for himself. Per RNCM notes, pt desires to return home and refuses offer of home health services or SNF.   Medications reviewed and include melatonin, spiriva, and sodium bicarbonate in dextrose 5% @ 75 ml/hr.   Lab Results  Component Value Date   HGBA1C 5.9 (H) 12/28/2021   PTA DM medications are none.   Labs reviewed: Na: 131, CBGS: 120-161 (inpatient orders for glycemic control are 0-15 units insulin aspart TID with meals, 0-5 units insulin aspart daily at bedtime, and 10 units insulin detemir daily).    Diet Order:   Diet Order             Diet 2 gram sodium Room service appropriate? Yes; Fluid consistency: Thin; Fluid restriction: 1200 mL Fluid  Diet effective now                   EDUCATION NEEDS:   No education needs have been identified at this time  Skin:  Skin Assessment: Reviewed  RN Assessment  Last BM:  12/30/21  Height:   Ht Readings from Last 1 Encounters:  12/28/21 5\' 9"  (1.753 m)    Weight:   Wt Readings from Last 1 Encounters:  12/28/21 92.6 kg    Ideal Body Weight:  72.7 kg  BMI:  Body mass index is 30.15 kg/m.  Estimated Nutritional Needs:   Kcal:  2200-2400  Protein:  110-125 grams  Fluid:  > 2 L    Loistine Chance, RD, LDN, [place] Registered Dietitian II Certified Diabetes Care and Education Specialist Please refer to [facility] for RD and/or RD on-call/weekend/after hours pager

## 2021-12-30 NOTE — Consult Note (Signed)
PHARMACY CONSULT NOTE  Pharmacy Consult for Electrolyte Monitoring and Replacement   Recent Labs: Potassium (mmol/L)  Date Value  12/30/2021 3.6  01/21/2014 3.7   Magnesium (mg/dL)  Date Value  12/30/2021 2.0   Calcium (mg/dL)  Date Value  12/30/2021 7.7 (L)   Calcium, Total (mg/dL)  Date Value  01/21/2014 9.0   Albumin (g/dL)  Date Value  12/30/2021 2.1 (L)   Phosphorus (mg/dL)  Date Value  12/30/2021 3.7   Sodium (mmol/L)  Date Value  12/30/2021 131 (L)  01/21/2014 137   Assessment: Patient is a 66 y/o M with medical history including COPD, HTN, chronic urinary retention with bilateral hydronephrosis who is admitted with urosepsis secondary to ESBL E coli bacteremia from bilateral obstructing distal ureteral calculi. Admission complicated by Afib with RVR, AKI, and metabolic acidosis. Pharmacy consulted to assist with electrolyte monitoring and replacement as indicated.  Goal of Therapy:  Electrolytes within normal limits  Plan:  --Renal function improving on bicarbonate gtt.  Scr 3.34>>3.18 --Follow-up electrolytes with AM labs tomorrow  Gyanna Jarema A 12/30/2021 10:44 AM

## 2021-12-30 NOTE — Progress Notes (Signed)
[facility]  ROUNDING NOTE   Subjective:    Patient main comment  in today visit was that he was feeling better than before   02/24 0701 - 02/25 0700 In: 3657.3 [P.O.:840; I.V.:2531.4; IV Piggyback:285.9] Out: 2155 [Urine:2155] Lab Results  Component Value Date   CREATININE 3.18 (H) 12/30/2021   CREATININE 3.34 (H) 12/29/2021   CREATININE 2.84 (H) 12/29/2021     Objective:  Vital signs in last 24 hours:  Temp:  [96.6 F (35.9 C)-97.6 F (36.4 C)] 97.5 F (36.4 C) (02/25 0400) Pulse Rate:  [45-161] 103 (02/25 0700) Resp:  [14-27] 23 (02/25 0700) BP: (82-120)/(45-109) 101/64 (02/25 0700) SpO2:  [93 %-100 %] 98 % (02/25 0700)  Weight change:  Filed Weights   12/26/21 2013 12/28/21 1500  Weight: 72.6 kg 92.6 kg    Intake/Output: I/O last 3 completed shifts: In: 5456.1 [P.O.:840; I.V.:4230.2; IV Piggyback:385.9] Out: 2805 [Urine:2805]   Intake/Output this shift:  No intake/output data recorded.  Physical Exam: General: Ill-appearing  Head: Normocephalic, atraumatic. Moist oral mucosal membranes  Eyes: Anicteric  Lungs:  Normal effort  Heart: Regular rate and rhythm  Abdomen:  Soft, nontender, obese  Extremities: No peripheral edema.  Neurologic: Nonfocal, moving all four extremities  Skin: No acute skin rash  GU Foley catheter in place    Basic Metabolic Panel: Recent Labs  Lab 12/28/21 0500 12/28/21 1101 12/29/21 0621 12/29/21 0716 12/30/21 0624  NA 130* 128* 128* 127* 131*  K 4.2 4.0 2.8* 3.7 3.6  CL 105 100 89* 97* 96*  CO2 14* 15* 29 18* 23  GLUCOSE 260* 341* 684* 325* 112*  BUN 67* 66* 51* 57* 59*  CREATININE 3.86* 3.70* 2.84* 3.34* 3.18*  CALCIUM 7.7* 7.7* 6.1* 7.4* 7.7*  MG 1.7  --  1.6*  --  2.0  PHOS 4.5  --  3.6  --  3.7    Liver Function Tests: Recent Labs  Lab 12/26/21 2020 12/27/21 0532 12/29/21 0621 12/29/21 0716 12/30/21 0624  AST 31 28  --  18  --   ALT 55* 44  --  35  --   ALKPHOS 204* 167*  --  157*  --    BILITOT 2.1* 1.5*  --  0.7  --   PROT 8.1 6.8  --  7.0  --   ALBUMIN 2.7* 2.2* 1.8* 2.3* 2.1*   No results for input(s): LIPASE, AMYLASE in the last 168 hours. No results for input(s): AMMONIA in the last 168 hours.  CBC: Recent Labs  Lab 12/26/21 2020 12/27/21 0532 12/28/21 0500 12/29/21 0621 12/29/21 0718 12/30/21 0624 12/30/21 0756  WBC 19.2* 14.4* 18.5* 19.0*  --  6.9  --   NEUTROABS  --   --  16.3* 17.5*  --  5.8  --   HGB 10.2* 8.3* 8.1* 6.7* 8.3* 6.8* 7.6*  HCT 32.2* 25.6* 25.4* 19.9* 25.0* 20.5* 22.6*  MCV 89.2 89.2 87.3 84.7  --  84.4  --   PLT 207 156 226 181  --  127*  --     Cardiac Enzymes: Recent Labs  Lab 12/26/21 2020  CKTOTAL 10*    BNP: Invalid input(s): POCBNP  CBG: Recent Labs  Lab 12/29/21 0747 12/29/21 1154 12/29/21 1603 12/29/21 2041 12/30/21 0740  GLUCAP 312* 283* 6* 161* 120*    Microbiology: Results for orders placed or performed during the hospital encounter of 12/26/21  Resp Panel by RT-PCR (Flu A&B, Covid)     Status: Abnormal   Collection  Time: 12/26/21 10:33 PM   Specimen: Nasopharyngeal(NP) swabs in vial transport medium  Result Value Ref Range Status   SARS Coronavirus 2 by RT PCR POSITIVE (A) NEGATIVE Final    Comment: (NOTE) SARS-CoV-2 target nucleic acids are DETECTED.  The SARS-CoV-2 RNA is generally detectable in upper respiratory specimens during the acute phase of infection. Positive results are indicative of the presence of the identified virus, but do not rule out bacterial infection or co-infection with other pathogens not detected by the test. Clinical correlation with patient history and other diagnostic information is necessary to determine patient infection status. The expected result is Negative.  Fact Sheet for Patients: EntrepreneurPulse.com.au  Fact Sheet for Healthcare Providers: IncredibleEmployment.be  This test is not yet approved or cleared by the [place] FDA and  has been authorized for detection and/or diagnosis of SARS-CoV-2 by FDA under an Emergency Use Authorization (EUA).  This EUA will remain in effect (meaning this test can be used) for the duration of  the COVID-19 declaration under Section 564(b)(1) of the A ct, 21 U.S.C. section 360bbb-3(b)(1), unless the authorization is terminated or revoked sooner.     Influenza A by PCR NEGATIVE NEGATIVE Final   Influenza B by PCR NEGATIVE NEGATIVE Final    Comment: (NOTE) The Xpert Xpress SARS-CoV-2/FLU/RSV plus assay is intended as an aid in the diagnosis of influenza from Nasopharyngeal swab specimens and should not be used as a sole basis for treatment. Nasal washings and aspirates are unacceptable for Xpert Xpress SARS-CoV-2/FLU/RSV testing.  Fact Sheet for Patients: EntrepreneurPulse.com.au  Fact Sheet for Healthcare Providers: IncredibleEmployment.be  This test is not yet approved or cleared by the [place] FDA and has been authorized for detection and/or diagnosis of SARS-CoV-2 by FDA under an Emergency Use Authorization (EUA). This EUA will remain in effect (meaning this test can be used) for the duration of the COVID-19 declaration under Section 564(b)(1) of the Act, 21 U.S.C. section 360bbb-3(b)(1), unless the authorization is terminated or revoked.  Performed at [facility], [address] [place], [place] [address]   Blood culture (routine x 2)     Status: None (Preliminary result)   Collection Time: 12/26/21 10:33 PM   Specimen: BLOOD  Result Value Ref Range Status   Specimen Description BLOOD LEFT ASSIST CONTROL  Final   Special Requests   Final    BOTTLES DRAWN AEROBIC AND ANAEROBIC Blood Culture adequate volume   Culture   Final    NO GROWTH 4 DAYS Performed at [facility], [address] [place], [place] [address]    Report Status PENDING  Incomplete  Blood culture (routine x 2)      Status: Abnormal   Collection Time: 12/26/21 10:46 PM   Specimen: BLOOD  Result Value Ref Range Status   Specimen Description   Final    BLOOD RIGHT ANTECUBITAL Performed at [facility], [place] [address], [place], [place] [address]    Special Requests   Final    BOTTLES DRAWN AEROBIC AND ANAEROBIC Blood Culture adequate volume Performed at [facility], [address] [place], [place] [address]    Culture  Setup Time   Final    GRAM NEGATIVE RODS ANAEROBIC BOTTLE ONLY Organism ID to follow CRITICAL RESULT CALLED TO, READ BACK BY AND VERIFIED WITH: [facility] MITCHELL OZDGUY [id number] 12/27/21 HNM    Culture (A)  Final    ESCHERICHIA COLI Confirmed Extended Spectrum Beta-Lactamase Producer (ESBL).  In bloodstream infections from  ESBL organisms, carbapenems are preferred over piperacillin/tazobactam. They are shown to have a lower risk of mortality.    Report Status 12/29/2021 FINAL  Final   Organism ID, Bacteria ESCHERICHIA COLI  Final      Susceptibility   Escherichia coli - MIC*    AMPICILLIN >=32 RESISTANT Resistant     CEFAZOLIN >=64 RESISTANT Resistant     CEFEPIME 16 RESISTANT Resistant     CEFTAZIDIME RESISTANT Resistant     CEFTRIAXONE >=64 RESISTANT Resistant     CIPROFLOXACIN <=0.25 SENSITIVE Sensitive     GENTAMICIN <=1 SENSITIVE Sensitive     IMIPENEM <=0.25 SENSITIVE Sensitive     TRIMETH/SULFA <=20 SENSITIVE Sensitive     AMPICILLIN/SULBACTAM 8 SENSITIVE Sensitive     PIP/TAZO <=4 SENSITIVE Sensitive     * ESCHERICHIA COLI  Blood Culture ID Panel (Reflexed)     Status: Abnormal   Collection Time: 12/26/21 10:46 PM  Result Value Ref Range Status   Enterococcus faecalis NOT DETECTED NOT DETECTED Final   Enterococcus Faecium NOT DETECTED NOT DETECTED Final   Listeria monocytogenes NOT DETECTED NOT DETECTED Final   Staphylococcus species NOT DETECTED NOT DETECTED Final   Staphylococcus aureus (BCID) NOT DETECTED NOT DETECTED Final   Staphylococcus  epidermidis NOT DETECTED NOT DETECTED Final   Staphylococcus lugdunensis NOT DETECTED NOT DETECTED Final   Streptococcus species NOT DETECTED NOT DETECTED Final   Streptococcus agalactiae NOT DETECTED NOT DETECTED Final   Streptococcus pneumoniae NOT DETECTED NOT DETECTED Final   Streptococcus pyogenes NOT DETECTED NOT DETECTED Final   A.calcoaceticus-baumannii NOT DETECTED NOT DETECTED Final   Bacteroides fragilis NOT DETECTED NOT DETECTED Final   Enterobacterales DETECTED (A) NOT DETECTED Final    Comment: Enterobacterales represent a large order of gram negative bacteria, not a single organism. CRITICAL RESULT CALLED TO, READ BACK BY AND VERIFIED WITH: DEVAN MITCHELL PHARMD 1126 12/27/21 HNM    Enterobacter cloacae complex NOT DETECTED NOT DETECTED Final   Escherichia coli DETECTED (A) NOT DETECTED Final    Comment: CRITICAL RESULT CALLED TO, READ BACK BY AND VERIFIED WITH: DEVAN MITCHELL [id number] 12/27/21 HNM    Klebsiella aerogenes NOT DETECTED NOT DETECTED Final   Klebsiella oxytoca NOT DETECTED NOT DETECTED Final   Klebsiella pneumoniae NOT DETECTED NOT DETECTED Final   Proteus species NOT DETECTED NOT DETECTED Final   Salmonella species NOT DETECTED NOT DETECTED Final   Serratia marcescens NOT DETECTED NOT DETECTED Final   Haemophilus influenzae NOT DETECTED NOT DETECTED Final   Neisseria meningitidis NOT DETECTED NOT DETECTED Final   Pseudomonas aeruginosa NOT DETECTED NOT DETECTED Final   Stenotrophomonas maltophilia NOT DETECTED NOT DETECTED Final   Candida albicans NOT DETECTED NOT DETECTED Final   Candida auris NOT DETECTED NOT DETECTED Final   Candida glabrata NOT DETECTED NOT DETECTED Final   Candida krusei NOT DETECTED NOT DETECTED Final   Candida parapsilosis NOT DETECTED NOT DETECTED Final   Candida tropicalis NOT DETECTED NOT DETECTED Final   Cryptococcus neoformans/gattii NOT DETECTED NOT DETECTED Final   CTX-M ESBL DETECTED (A) NOT DETECTED Final    Comment:  CRITICAL RESULT CALLED TO, READ BACK BY AND VERIFIED WITH: DEVAN MITCHELL PHARMD 1126 12/27/21 HNM (NOTE) Extended spectrum beta-lactamase detected. Recommend a carbapenem as initial therapy.      Carbapenem resistance IMP NOT DETECTED NOT DETECTED Final   Carbapenem resistance KPC NOT DETECTED NOT DETECTED Final   Carbapenem resistance NDM NOT DETECTED NOT DETECTED Final   Carbapenem resist OXA 48 LIKE NOT  DETECTED NOT DETECTED Final   Carbapenem resistance VIM NOT DETECTED NOT DETECTED Final    Comment: Performed at [facility], [address] [place], [place] [address]  Urine Culture     Status: Abnormal   Collection Time: 12/27/21  9:59 AM   Specimen: Urine, Clean Catch  Result Value Ref Range Status   Specimen Description   Final    URINE, CLEAN CATCH Performed at [facility], [address], [place], [place] [address]    Special Requests   Final    NONE Performed at [facility], [address], [place], [place] [address]    Culture (A)  Final    <10,000 COLONIES/mL INSIGNIFICANT GROWTH Performed at [facility] [address] [place], [place] [address]    Report Status 12/28/2021 FINAL  Final  MRSA Next Gen by PCR, Nasal     Status: Abnormal   Collection Time: 12/28/21  3:10 PM   Specimen: Nasal Mucosa; Nasal Swab  Result Value Ref Range Status   MRSA by PCR Next Gen DETECTED (A) NOT DETECTED Final    Comment: RESULT CALLED TO, READ BACK BY AND VERIFIED WITH: GINA SHANNON 1701 12/28/21 MU (NOTE) The GeneXpert MRSA Assay (FDA approved for NASAL specimens only), is one component of a comprehensive MRSA colonization surveillance program. It is not intended to diagnose MRSA infection nor to guide or monitor treatment for MRSA infections. Test performance is not FDA approved in patients less than 63 years old. Performed at [facility], [address] [place], [place] [address]   CULTURE, BLOOD (ROUTINE X 2) w Reflex  to ID Panel     Status: None (Preliminary result)   Collection Time: 12/29/21  6:21 AM   Specimen: BLOOD  Result Value Ref Range Status   Specimen Description BLOOD RIGHT [facility]  Final   Special Requests   Final    BOTTLES DRAWN AEROBIC AND ANAEROBIC Blood Culture adequate volume   Culture   Final    NO GROWTH 1 DAY Performed at [facility], [address], [place], [place] [address]    Report Status PENDING  Incomplete  CULTURE, BLOOD (ROUTINE X 2) w Reflex to ID Panel     Status: None (Preliminary result)   Collection Time: 12/29/21  6:21 AM   Specimen: BLOOD  Result Value Ref Range Status   Specimen Description BLOOD RIGHT HAND  Final   Special Requests IN PEDIATRIC BOTTLE Blood Culture adequate volume  Final   Culture   Final    NO GROWTH 1 DAY Performed at [facility], [address], [place],  [address]    Report Status PENDING  Incomplete    Coagulation Studies: No results for input(s): LABPROT, INR in the last 72 hours.  Urinalysis: No results for input(s): COLORURINE, LABSPEC, PHURINE, GLUCOSEU, HGBUR, BILIRUBINUR, KETONESUR, PROTEINUR, UROBILINOGEN, NITRITE, LEUKOCYTESUR in the last 72 hours.  Invalid input(s): APPERANCEUR    Imaging: No results found.   Medications:    sodium chloride     sodium chloride     meropenem (MERREM) IV Stopped (12/29/21 2149)   phenylephrine (NEO-SYNEPHRINE) Adult infusion Stopped (12/29/21 1432)   sodium bicarbonate 150 mEq in D5W infusion 75 mL/hr at 12/30/21 0700    amiodarone  200 mg Oral BID   aspirin EC  81 mg Oral Daily   atorvastatin  80 mg Oral Daily   Chlorhexidine Gluconate Cloth  6 each Topical Daily   feeding supplement (NEPRO  CARB STEADY)  237 mL Oral TID BM   heparin  5,000 Units Subcutaneous Q12H   insulin aspart  0-15 Units Subcutaneous TID WC   insulin aspart  0-5 Units Subcutaneous QHS   insulin detemir  10 Units Subcutaneous Daily   levalbuterol  2 puff Inhalation Q8H    melatonin  5 mg Oral QHS   midodrine  10 mg Oral TID WC   mupirocin ointment  1 application Nasal BID   pantoprazole  40 mg Oral BID   pneumococcal 23 valent vaccine  0.5 mL Intramuscular Tomorrow-1000   sodium chloride flush  3 mL Intravenous Q12H   sodium chloride flush  3 mL Intravenous Q12H   tamsulosin  0.4 mg Oral Daily   tiotropium  1 capsule Inhalation Daily   traZODone  50 mg Oral QHS   sodium chloride, acetaminophen **OR** acetaminophen, menthol-cetylpyridinium, ondansetron **OR** ondansetron (ZOFRAN) IV, sodium chloride flush  2D echo done on December 13, 2021 Summary    1. An ultrasound enhancing agent was used to improve the visualization of  the left ventricular cavity and endocardial borders.    2. Technically difficult study.    3. The left ventricle is upper normal in size with normal wall thickness.    4. The left ventricular systolic function is severely decreased, LVEF is  visually estimated at 25-30%.    5. There is thinning and akinesis involving the mid inferior and basal  inferior segment(s).    6. There is grade III diastolic dysfunction (severely elevated filling  pressure).    7. The right ventricle is normal in size, with low normal systolic function.    Assessment/ Plan:  Mr. Decorey Wahlert is a 66 y.o.  male with past medical conditions including COPD and hypertension , who was admitted to [facility] on 12/26/2021 for Atrial fibrillation with rapid ventricular response ([place]) [I48.91] Acute cystitis without hematuria [N30.00] AKI (acute kidney injury) ([place]) [N17.9] Septic shock ([place]) [A41.9, R65.21] Sepsis ([employer]) [A41.9] Sepsis, due to unspecified organism, unspecified whether acute organ dysfunction present ([place]) [A41.9]   Acute kidney injury with chronic kidney disease stage IV.  Baseline appears to be 2.8 with GFR 25 on 12/22/2021.  AKI may be due to possible obstruction from migrating stent recently placed at [facility].    Renal function has been fluctuating.             Patient creatinine is now at 3.1--3.3.             Urine output adequate             For now continue supportive care.              No immediate need for dialysis.  Lab Results  Component Value Date   CREATININE 3.18 (H) 12/30/2021   CREATININE 3.34 (H) 12/29/2021   CREATININE 2.84 (H) 12/29/2021    Intake/Output Summary (Last 24 hours) at 12/30/2021 0849 Last data filed at 12/30/2021 0700 Gross per 24 hour  Intake 2858.56 ml  Output 2155 ml  Net 703.56 ml   2.  Hypotension.  Currently on amiodarone drip with midodrine schedule III times daily.     3.  Acute metabolic acidosis. Patient bicarb is now better  4.Anemia of chronic disease Patient hemoglobin was down to 6.7 now better  5.Combined systolic and diastolic CHF Patient is currently well compensated  6.COVID 19 Patient is currently asymptomatic Patient is on respiratory precautions Patient is being followed by the team  [place]  s Theador [place] 2/25/20238:49 AM

## 2021-12-30 NOTE — Progress Notes (Signed)
ID PROGRESS NOTE  66yo M with urosepsis from ESBL ecoli, improving, however + blood cx from 2/24  - will arrange for telehealth visit with patient/RN  - blood cx identified c.glabrata on repeat blood cx from 2/24.  - will start anidulafungin - recommend repeat blood cx on 2/26 - will likely need TEE to rule out endocarditis  Jasime Westergren [facility] [phone]

## 2021-12-31 ENCOUNTER — Inpatient Hospital Stay
Admit: 2021-12-31 | Discharge: 2021-12-31 | Disposition: A | Discharge status: Home / Self Care | Payer: Medicare Other | Attending: Internal Medicine | Admitting: Internal Medicine

## 2021-12-31 DIAGNOSIS — N179 Acute kidney failure, unspecified: Secondary | ICD-10-CM | POA: Diagnosis not present

## 2021-12-31 DIAGNOSIS — A419 Sepsis, unspecified organism: Secondary | ICD-10-CM | POA: Diagnosis not present

## 2021-12-31 DIAGNOSIS — F320 Major depressive disorder, single episode, mild: Secondary | ICD-10-CM | POA: Diagnosis not present

## 2021-12-31 DIAGNOSIS — I4891 Unspecified atrial fibrillation: Secondary | ICD-10-CM | POA: Diagnosis not present

## 2021-12-31 LAB — IRON AND TIBC
Iron: 16 ug/dL — ABNORMAL LOW (ref 45–182)
Saturation Ratios: 6 % — ABNORMAL LOW (ref 17.9–39.5)
TIBC: 270 ug/dL (ref 250–450)
UIBC: 254 ug/dL

## 2021-12-31 LAB — CBC WITH DIFFERENTIAL/PLATELET
Abs Immature Granulocytes: 0.05 10*3/uL (ref 0.00–0.07)
Basophils Absolute: 0 10*3/uL (ref 0.0–0.1)
Basophils Relative: 0 %
Eosinophils Absolute: 0.1 10*3/uL (ref 0.0–0.5)
Eosinophils Relative: 1 %
HCT: 19.1 % — ABNORMAL LOW (ref 39.0–52.0)
Hemoglobin: 6.2 g/dL — ABNORMAL LOW (ref 13.0–17.0)
Immature Granulocytes: 1 %
Lymphocytes Relative: 13 %
Lymphs Abs: 0.9 10*3/uL (ref 0.7–4.0)
MCH: 27.9 pg (ref 26.0–34.0)
MCHC: 32.5 g/dL (ref 30.0–36.0)
MCV: 86 fL (ref 80.0–100.0)
Monocytes Absolute: 0.2 10*3/uL (ref 0.1–1.0)
Monocytes Relative: 3 %
Neutro Abs: 5.2 10*3/uL (ref 1.7–7.7)
Neutrophils Relative %: 82 %
Platelets: 114 10*3/uL — ABNORMAL LOW (ref 150–400)
RBC: 2.22 MIL/uL — ABNORMAL LOW (ref 4.22–5.81)
RDW: 18 % — ABNORMAL HIGH (ref 11.5–15.5)
Smear Review: DECREASED
WBC: 6.5 10*3/uL (ref 4.0–10.5)
nRBC: 0 % (ref 0.0–0.2)

## 2021-12-31 LAB — GLUCOSE, CAPILLARY
Glucose-Capillary: 116 mg/dL — ABNORMAL HIGH (ref 70–99)
Glucose-Capillary: 131 mg/dL — ABNORMAL HIGH (ref 70–99)
Glucose-Capillary: 111 mg/dL — ABNORMAL HIGH (ref 70–99)

## 2021-12-31 LAB — LACTATE DEHYDROGENASE: LDH: 130 U/L (ref 98–192)

## 2021-12-31 LAB — RENAL FUNCTION PANEL
Albumin: 2 g/dL — ABNORMAL LOW (ref 3.5–5.0)
Anion gap: 10 (ref 5–15)
BUN: 53 mg/dL — ABNORMAL HIGH (ref 8–23)
CO2: 27 mmol/L (ref 22–32)
Calcium: 7.5 mg/dL — ABNORMAL LOW (ref 8.9–10.3)
Chloride: 93 mmol/L — ABNORMAL LOW (ref 98–111)
Creatinine, Ser: 2.88 mg/dL — ABNORMAL HIGH (ref 0.61–1.24)
GFR, Estimated: 24 mL/min — ABNORMAL LOW (ref >60)
Glucose, Bld: 118 mg/dL — ABNORMAL HIGH (ref 70–99)
Phosphorus: 3.9 mg/dL (ref 2.5–4.6)
Potassium: 3.4 mmol/L — ABNORMAL LOW (ref 3.5–5.1)
Sodium: 130 mmol/L — ABNORMAL LOW (ref 135–145)

## 2021-12-31 LAB — CBC
HCT: 21.9 % — ABNORMAL LOW (ref 39.0–52.0)
Hemoglobin: 7 g/dL — ABNORMAL LOW (ref 13.0–17.0)
MCH: 27.6 pg (ref 26.0–34.0)
MCHC: 32 g/dL (ref 30.0–36.0)
MCV: 86.2 fL (ref 80.0–100.0)
Platelets: 128 10*3/uL — ABNORMAL LOW (ref 150–400)
RBC: 2.54 MIL/uL — ABNORMAL LOW (ref 4.22–5.81)
RDW: 18.4 % — ABNORMAL HIGH (ref 11.5–15.5)
WBC: 7.9 10*3/uL (ref 4.0–10.5)
nRBC: 0 % (ref 0.0–0.2)

## 2021-12-31 LAB — BASIC METABOLIC PANEL
Anion gap: 10 (ref 5–15)
BUN: 54 mg/dL — ABNORMAL HIGH (ref 8–23)
CO2: 28 mmol/L (ref 22–32)
Calcium: 7.7 mg/dL — ABNORMAL LOW (ref 8.9–10.3)
Chloride: 94 mmol/L — ABNORMAL LOW (ref 98–111)
Creatinine, Ser: 2.91 mg/dL — ABNORMAL HIGH (ref 0.61–1.24)
GFR, Estimated: 23 mL/min — ABNORMAL LOW (ref >60)
Glucose, Bld: 98 mg/dL (ref 70–99)
Potassium: 3.6 mmol/L (ref 3.5–5.1)
Sodium: 132 mmol/L — ABNORMAL LOW (ref 135–145)

## 2021-12-31 LAB — CULTURE, BLOOD (ROUTINE X 2)
Culture: NO GROWTH
Special Requests: ADEQUATE

## 2021-12-31 LAB — MAGNESIUM: Magnesium: 1.9 mg/dL (ref 1.7–2.4)

## 2021-12-31 LAB — FERRITIN: Ferritin: 196 ng/mL (ref 24–336)

## 2021-12-31 LAB — VITAMIN B12: Vitamin B-12: 865 pg/mL (ref 180–914)

## 2021-12-31 LAB — FOLATE: Folate: 4.3 ng/mL — ABNORMAL LOW (ref >5.9)

## 2021-12-31 MED ORDER — MAGNESIUM SULFATE IN D5W 1-5 GM/100ML-% IV SOLN
1.0000 g | Freq: Once | INTRAVENOUS | Status: AC
  Administered 2021-12-31: 1 g via INTRAVENOUS
  Filled 2021-12-31: qty 100

## 2021-12-31 MED ORDER — POTASSIUM CHLORIDE CRYS ER 20 MEQ PO TBCR
40.0000 meq | EXTENDED_RELEASE_TABLET | Freq: Once | ORAL | Status: AC
Start: 2021-12-31 — End: 2021-12-31
  Administered 2021-12-31: 40 meq via ORAL
  Filled 2021-12-31: qty 2

## 2021-12-31 MED ORDER — OXYCODONE HCL 5 MG PO TABS
5.0000 mg | ORAL_TABLET | ORAL | Status: DC | PRN
  Administered 2021-12-31 – 2022-01-13 (×17): 10 mg via ORAL
  Filled 2021-12-31 (×17): qty 2

## 2021-12-31 MED ORDER — LACTATED RINGERS IV SOLN: INTRAVENOUS | Status: DC

## 2021-12-31 MED ORDER — OXYBUTYNIN CHLORIDE 5 MG PO TABS
5.0000 mg | ORAL_TABLET | Freq: Three times a day (TID) | ORAL | Status: DC | PRN
  Filled 2021-12-31: qty 1

## 2021-12-31 MED ORDER — SERTRALINE HCL 50 MG PO TABS
100.0000 mg | ORAL_TABLET | Freq: Two times a day (BID) | ORAL | Status: DC
  Administered 2022-01-02 – 2022-01-04 (×4): 100 mg via ORAL
  Filled 2021-12-31 (×8): qty 2

## 2021-12-31 NOTE — Progress Notes (Incomplete)
[facility] Cardiology    SUBJECTIVE: ***   Vitals:   12/31/21 1302 12/31/21 1449 12/31/21 1741 12/31/21 2031  BP: (!) 101/50 97/65 100/63 113/76  Pulse: (!) 106 99 100 74  Resp: 20 18 19 18   Temp:   98.4 F (36.9 C) 97.9 F (36.6 C)  TempSrc:   Oral   SpO2: 99% 98% 98% 100%  Weight:   91.4 kg   Height:   5\' 9"  (1.753 m)      Intake/Output Summary (Last 24 hours) at 12/31/2021 2356 Last data filed at 12/31/2021 2200 Gross per 24 hour  Intake 2392.12 ml  Output 2510 ml  Net -117.88 ml      PHYSICAL EXAM  General: Well developed, well nourished, in no acute distress HEENT:  Normocephalic and atramatic Neck:  No JVD.  Lungs: Clear bilaterally to auscultation and percussion. Heart: HRRR . Normal S1 and S2 without gallops or murmurs.  Abdomen: Bowel sounds are positive, abdomen soft and non-tender  Msk:  Back normal, normal gait. Normal strength and tone for age. Extremities: No clubbing, cyanosis or edema.   Neuro: Alert and oriented X 3. Psych:  Good affect, responds appropriately   LABS: Basic Metabolic Panel: Recent Labs    12/30/21 0624 12/31/21 0308 12/31/21 0631  NA 131* 130* 132*  K 3.6 3.4* 3.6  CL 96* 93* 94*  CO2 23 27 28   GLUCOSE 112* 118* 98  BUN 59* 53* 54*  CREATININE 3.18* 2.88* 2.91*  CALCIUM 7.7* 7.5* 7.7*  MG 2.0 1.9  --   PHOS 3.7 3.9  --    Liver Function Tests: Recent Labs    12/29/21 0716 12/30/21 0624 12/31/21 0308  AST 18  --   --   ALT 35  --   --   ALKPHOS 157*  --   --   BILITOT 0.7  --   --   PROT 7.0  --   --   ALBUMIN 2.3* 2.1* 2.0*   No results for input(s): LIPASE, AMYLASE in the last 72 hours. CBC: Recent Labs    12/30/21 0624 12/30/21 0756 12/31/21 0308 12/31/21 0631  WBC 6.9  --  6.5 7.9  NEUTROABS 5.8  --  5.2  --   HGB 6.8*   < > 6.2* 7.0*  HCT 20.5*   < > 19.1* 21.9*  MCV 84.4  --  86.0 86.2  PLT 127*  --  114* 128*   < > = values in this interval not displayed.   Cardiac Enzymes: No results for  input(s): CKTOTAL, CKMB, CKMBINDEX, TROPONINI in the last 72 hours. BNP: Invalid input(s): POCBNP D-Dimer: No results for input(s): DDIMER in the last 72 hours. Hemoglobin A1C: No results for input(s): HGBA1C in the last 72 hours. Fasting Lipid Panel: No results for input(s): CHOL, HDL, LDLCALC, TRIG, CHOLHDL, LDLDIRECT in the last 72 hours. Thyroid Function Tests: No results for input(s): TSH, T4TOTAL, T3FREE, THYROIDAB in the last 72 hours.  Invalid input(s): FREET3 Anemia Panel: Recent Labs    12/31/21 1239  VITAMINB12 865  FOLATE 4.3*  FERRITIN 196  TIBC 270  IRON 16*    No results found.   Echo ***  TELEMETRY: ***:  ASSESSMENT AND PLAN:  Principal Problem:   Depression Active Problems:   Abnormal LFTs (liver function tests)   Essential hypertension   Sepsis (HCC)   Urinary tract infection   Involuntary commitment   AKI (acute kidney injury) (HCC)   Elevated troponin   Elevated glucose  Atrial fibrillation with RVR (HCC)   Ureteral stent displacement, initial encounter ([place])   COVID-19 virus infection   COPD with acute exacerbation ([place])   Septic shock ([place])    1. ***   Yolonda Kida, MD, 12/31/2021 11:56 PM

## 2021-12-31 NOTE — Consult Note (Signed)
PHARMACY CONSULT NOTE  Pharmacy Consult for Electrolyte Monitoring and Replacement   Recent Labs: Potassium (mmol/L)  Date Value  12/31/2021 3.6  01/21/2014 3.7   Magnesium (mg/dL)  Date Value  12/31/2021 1.9   Calcium (mg/dL)  Date Value  12/31/2021 7.7 (L)   Calcium, Total (mg/dL)  Date Value  01/21/2014 9.0   Albumin (g/dL)  Date Value  12/31/2021 2.0 (L)   Phosphorus (mg/dL)  Date Value  12/31/2021 3.9   Sodium (mmol/L)  Date Value  12/31/2021 132 (L)  01/21/2014 137   Corrected Ca: 9.3 mg/dL  Assessment: Patient is a 66 y/o M with medical history including COPD, HTN, chronic urinary retention with bilateral hydronephrosis who is admitted with urosepsis secondary to ESBL E coli bacteremia from bilateral obstructing distal ureteral calculi. Admission complicated by Afib with RVR, AKI, and metabolic acidosis. Pharmacy consulted to assist with electrolyte monitoring and replacement as indicated.  Goal of Therapy:  Potassium 4.0 - 5.1 mmol/L Magnesium 2.0 - 2.4 mg/dL All Other electrolytes within normal limits  Plan:  1 gram IV magnesium sulfate x 1 40 mEq po KCl x 1 Follow-up electrolytes with AM labs tomorrow  Dallie Piles 12/31/2021 11:22 AM

## 2021-12-31 NOTE — Progress Notes (Signed)
[place] for Infectious Disease    Date of Admission:  12/26/2021   Total days of antibiotics 6 meropenem/day 2 anidulafungin          ID: Wayne Burns is a 66 y.o. male with CKD IV, bilateral stent placed for kidney stones/bilateral hydronephrosis since 2020. Poor living conditions.    He was IVC'd on 2/21 after leaving [facility] against medical advice(complicated hospitalization including severe covid). He was found to have sepsis of urinary source, aki as well as ureteral stent displacement.Infectious work up revealed ESBL ecoli bacteremia. He has been weaned off of pressors. Repeat blood cx were drawn on 2/24 which now identify c.glabrata but patient was somewhat improving at the time, no fevers but still on norepi for vasopressor support.  Principal Problem:   Depression Active Problems:   Abnormal LFTs (liver function tests)   Essential hypertension   Sepsis (HCC)   Urinary tract infection   Involuntary commitment   AKI (acute kidney injury) (HCC)   Elevated troponin   Elevated glucose   Atrial fibrillation with RVR (HCC)   Ureteral stent displacement, initial encounter ([place])   COVID-19 virus infection   COPD with acute exacerbation ([place])   Septic shock (HCC)    Subjective: Afebrile. Afib rate controlled. No longer on NE.  Medications:   (feeding supplement) PROSource Plus  30 mL Oral BID BM   amiodarone  200 mg Oral BID   aspirin EC  81 mg Oral Daily   atorvastatin  80 mg Oral Daily   Chlorhexidine Gluconate Cloth  6 each Topical Daily   feeding supplement (NEPRO CARB STEADY)  237 mL Oral BID BM   heparin  5,000 Units Subcutaneous Q12H   insulin aspart  0-15 Units Subcutaneous TID WC   insulin aspart  0-5 Units Subcutaneous QHS   insulin detemir  10 Units Subcutaneous Daily   levalbuterol  2 puff Inhalation Q8H   melatonin  5 mg Oral QHS   midodrine  10 mg Oral TID WC   multivitamin with minerals  1 tablet Oral Daily   mupirocin ointment  1 application Nasal BID    pantoprazole  40 mg Oral BID   pneumococcal 23 valent vaccine  0.5 mL Intramuscular Tomorrow-1000   sertraline  100 mg Oral BID   sodium chloride flush  3 mL Intravenous Q12H   sodium chloride flush  3 mL Intravenous Q12H   tamsulosin  0.4 mg Oral Daily   tiotropium  1 capsule Inhalation Daily   traZODone  50 mg Oral QHS    Objective: Vital signs in last 24 hours: Temp:  [98 F (36.7 C)-99.3 F (37.4 C)] 98.5 F (36.9 C) (02/26 1155) Pulse Rate:  [43-113] 99 (02/26 1449) Resp:  [13-24] 18 (02/26 1449) BP: (86-113)/(49-81) 97/65 (02/26 1449) SpO2:  [95 %-100 %] 98 % (02/26 1449)   Did not examine  Lab Results Recent Labs    12/31/21 0308 12/31/21 0631  WBC 6.5 7.9  HGB 6.2* 7.0*  HCT 19.1* 21.9*  NA 130* 132*  K 3.4* 3.6  CL 93* 94*  CO2 27 28  BUN 53* 54*  CREATININE 2.88* 2.91*   Liver Panel Recent Labs    12/29/21 0716 12/30/21 0624 12/31/21 0308  PROT 7.0  --   --   ALBUMIN 2.3* 2.1* 2.0*  AST 18  --   --   ALT 35  --   --   ALKPHOS 157*  --   --   BILITOT  0.7  --   --    Sedimentation Rate No results for input(s): ESRSEDRATE in the last 72 hours. C-Reactive Protein No results for input(s): CRP in the last 72 hours.  Microbiology: reviewef Studies/Results: No results found.   Assessment/Plan: Anemia = no active gi blood source. Has had recent work up. Continue to trend to see if needs blood transfusion and further investigation  Candidemia = continue on anidulafungin. Recommend TEE and removal of any indwelling central lines to give line holiday. Repeat blood cx on tuesday  Esbl ecoli bacteremia = for urosepsis. Plan to continue with meropenem for the time being, agree stent revision will help decrease risk of biofilm/colonization.  Will continue to see tomorrow.  [facility] for Infectious Diseases Pager: [phone]  12/31/2021, 3:07 PM

## 2021-12-31 NOTE — Progress Notes (Signed)
[facility]  ROUNDING NOTE   Subjective:    Patient main comment  in today visit was that he was feeling better than before   02/25 0701 - 02/26 0700 In: 2468.8 [P.O.:480; I.V.:1526.4; IV Piggyback:462.5] Out: 2675 [Urine:2675] Lab Results  Component Value Date   CREATININE 2.91 (H) 12/31/2021   CREATININE 2.88 (H) 12/31/2021   CREATININE 3.18 (H) 12/30/2021     Objective:  Vital signs in last 24 hours:  Temp:  [97.3 F (36.3 C)-99.3 F (37.4 C)] 98.3 F (36.8 C) (02/26 0100) Pulse Rate:  [30-113] 74 (02/26 0900) Resp:  [13-24] 21 (02/26 0900) BP: (86-113)/(49-81) 106/62 (02/26 0700) SpO2:  [95 %-100 %] 95 % (02/26 0900)  Weight change:  Filed Weights   12/26/21 2013 12/28/21 1500  Weight: 72.6 kg 92.6 kg    Intake/Output: I/O last 3 completed shifts: In: 3431.5 [P.O.:480; I.V.:2389.1; IV Piggyback:562.5] Out: 3805 [Urine:3805]   Intake/Output this shift:  No intake/output data recorded.  Physical Exam: General: Ill-appearing  Head: Normocephalic, atraumatic. Moist oral mucosal membranes  Eyes: Anicteric  Lungs:  Normal effort  Heart: Regular rate and rhythm  Abdomen:  Soft, nontender, obese  Extremities: No peripheral edema.  Neurologic: Nonfocal, moving all four extremities  Skin: No acute skin rash  GU Foley catheter in place    Basic Metabolic Panel: Recent Labs  Lab 12/28/21 0500 12/28/21 1101 12/29/21 0621 12/29/21 0716 12/30/21 0624 12/31/21 0308 12/31/21 0631  NA 130*   < > 128* 127* 131* 130* 132*  K 4.2   < > 2.8* 3.7 3.6 3.4* 3.6  CL 105   < > 89* 97* 96* 93* 94*  CO2 14*   < > 29 18* 23 27 28   GLUCOSE 260*   < > 684* 325* 112* 118* 98  BUN 67*   < > 51* 57* 59* 53* 54*  CREATININE 3.86*   < > 2.84* 3.34* 3.18* 2.88* 2.91*  CALCIUM 7.7*   < > 6.1* 7.4* 7.7* 7.5* 7.7*  MG 1.7  --  1.6*  --  2.0 1.9  --   PHOS 4.5  --  3.6  --  3.7 3.9  --    < > = values in this interval not displayed.    Liver Function  Tests: Recent Labs  Lab 12/26/21 2020 12/27/21 0532 12/29/21 0621 12/29/21 0716 12/30/21 0624 12/31/21 0308  AST 31 28  --  18  --   --   ALT 55* 44  --  35  --   --   ALKPHOS 204* 167*  --  157*  --   --   BILITOT 2.1* 1.5*  --  0.7  --   --   PROT 8.1 6.8  --  7.0  --   --   ALBUMIN 2.7* 2.2* 1.8* 2.3* 2.1* 2.0*   No results for input(s): LIPASE, AMYLASE in the last 168 hours. No results for input(s): AMMONIA in the last 168 hours.  CBC: Recent Labs  Lab 12/28/21 0500 12/29/21 0621 12/29/21 0718 12/30/21 0624 12/30/21 0756 12/31/21 0308 12/31/21 0631  WBC 18.5* 19.0*  --  6.9  --  6.5 7.9  NEUTROABS 16.3* 17.5*  --  5.8  --  5.2  --   HGB 8.1* 6.7* 8.3* 6.8* 7.6* 6.2* 7.0*  HCT 25.4* 19.9* 25.0* 20.5* 22.6* 19.1* 21.9*  MCV 87.3 84.7  --  84.4  --  86.0 86.2  PLT 226 181  --  127*  --  114* 128*    Cardiac Enzymes: Recent Labs  Lab 12/26/21 2020  CKTOTAL 10*    BNP: Invalid input(s): POCBNP  CBG: Recent Labs  Lab 12/29/21 2041 12/30/21 0740 12/30/21 1136 12/30/21 1636 12/30/21 2132  GLUCAP 161* 120* 161* 129* 62    Microbiology: Results for orders placed or performed during the hospital encounter of 12/26/21  Resp Panel by RT-PCR (Flu A&B, Covid)     Status: Abnormal   Collection Time: 12/26/21 10:33 PM   Specimen: Nasopharyngeal(NP) swabs in vial transport medium  Result Value Ref Range Status   SARS Coronavirus 2 by RT PCR POSITIVE (A) NEGATIVE Final    Comment: (NOTE) SARS-CoV-2 target nucleic acids are DETECTED.  The SARS-CoV-2 RNA is generally detectable in upper respiratory specimens during the acute phase of infection. Positive results are indicative of the presence of the identified virus, but do not rule out bacterial infection or co-infection with other pathogens not detected by the test. Clinical correlation with patient history and other diagnostic information is necessary to determine patient infection status. The expected  result is Negative.  Fact Sheet for Patients: EntrepreneurPulse.com.au  Fact Sheet for Healthcare Providers: IncredibleEmployment.be  This test is not yet approved or cleared by the [place] FDA and  has been authorized for detection and/or diagnosis of SARS-CoV-2 by FDA under an Emergency Use Authorization (EUA).  This EUA will remain in effect (meaning this test can be used) for the duration of  the COVID-19 declaration under Section 564(b)(1) of the A ct, 21 U.S.C. section 360bbb-3(b)(1), unless the authorization is terminated or revoked sooner.     Influenza A by PCR NEGATIVE NEGATIVE Final   Influenza B by PCR NEGATIVE NEGATIVE Final    Comment: (NOTE) The Xpert Xpress SARS-CoV-2/FLU/RSV plus assay is intended as an aid in the diagnosis of influenza from Nasopharyngeal swab specimens and should not be used as a sole basis for treatment. Nasal washings and aspirates are unacceptable for Xpert Xpress SARS-CoV-2/FLU/RSV testing.  Fact Sheet for Patients: EntrepreneurPulse.com.au  Fact Sheet for Healthcare Providers: IncredibleEmployment.be  This test is not yet approved or cleared by the [place] FDA and has been authorized for detection and/or diagnosis of SARS-CoV-2 by FDA under an Emergency Use Authorization (EUA). This EUA will remain in effect (meaning this test can be used) for the duration of the COVID-19 declaration under Section 564(b)(1) of the Act, 21 U.S.C. section 360bbb-3(b)(1), unless the authorization is terminated or revoked.  Performed at [facility], [address] [place], [place] [address]   Blood culture (routine x 2)     Status: None   Collection Time: 12/26/21 10:33 PM   Specimen: BLOOD  Result Value Ref Range Status   Specimen Description BLOOD LEFT ASSIST CONTROL  Final   Special Requests   Final    BOTTLES DRAWN AEROBIC AND ANAEROBIC Blood Culture  adequate volume   Culture   Final    NO GROWTH 5 DAYS Performed at [facility], [address] [place], [place] [address]    Report Status 12/31/2021 FINAL  Final  Blood culture (routine x 2)     Status: Abnormal   Collection Time: 12/26/21 10:46 PM   Specimen: BLOOD  Result Value Ref Range Status   Specimen Description   Final    BLOOD RIGHT ANTECUBITAL Performed at [facility], [place] [address], [place], [place] [address]    Special Requests   Final    BOTTLES DRAWN AEROBIC AND ANAEROBIC  Blood Culture adequate volume Performed at [facility], [address] [place], [place] [address]    Culture  Setup Time   Final    GRAM NEGATIVE RODS ANAEROBIC BOTTLE ONLY Organism ID to follow CRITICAL RESULT CALLED TO, READ BACK BY AND VERIFIED WITH: Lu Duffel PHARMD 1126 12/27/21 HNM    Culture (A)  Final    ESCHERICHIA COLI Confirmed Extended Spectrum Beta-Lactamase Producer (ESBL).  In bloodstream infections from ESBL organisms, carbapenems are preferred over piperacillin/tazobactam. They are shown to have a lower risk of mortality.    Report Status 12/29/2021 FINAL  Final   Organism ID, Bacteria ESCHERICHIA COLI  Final      Susceptibility   Escherichia coli - MIC*    AMPICILLIN >=32 RESISTANT Resistant     CEFAZOLIN >=64 RESISTANT Resistant     CEFEPIME 16 RESISTANT Resistant     CEFTAZIDIME RESISTANT Resistant     CEFTRIAXONE >=64 RESISTANT Resistant     CIPROFLOXACIN <=0.25 SENSITIVE Sensitive     GENTAMICIN <=1 SENSITIVE Sensitive     IMIPENEM <=0.25 SENSITIVE Sensitive     TRIMETH/SULFA <=20 SENSITIVE Sensitive     AMPICILLIN/SULBACTAM 8 SENSITIVE Sensitive     PIP/TAZO <=4 SENSITIVE Sensitive     * ESCHERICHIA COLI  Blood Culture ID Panel (Reflexed)     Status: Abnormal   Collection Time: 12/26/21 10:46 PM  Result Value Ref Range Status   Enterococcus faecalis NOT DETECTED NOT DETECTED Final   Enterococcus Faecium NOT DETECTED NOT DETECTED  Final   Listeria monocytogenes NOT DETECTED NOT DETECTED Final   Staphylococcus species NOT DETECTED NOT DETECTED Final   Staphylococcus aureus (BCID) NOT DETECTED NOT DETECTED Final   Staphylococcus epidermidis NOT DETECTED NOT DETECTED Final   Staphylococcus lugdunensis NOT DETECTED NOT DETECTED Final   Streptococcus species NOT DETECTED NOT DETECTED Final   Streptococcus agalactiae NOT DETECTED NOT DETECTED Final   Streptococcus pneumoniae NOT DETECTED NOT DETECTED Final   Streptococcus pyogenes NOT DETECTED NOT DETECTED Final   A.calcoaceticus-baumannii NOT DETECTED NOT DETECTED Final   Bacteroides fragilis NOT DETECTED NOT DETECTED Final   Enterobacterales DETECTED (A) NOT DETECTED Final    Comment: Enterobacterales represent a large order of gram negative bacteria, not a single organism. CRITICAL RESULT CALLED TO, READ BACK BY AND VERIFIED WITH: DEVAN MITCHELL PHARMD 1126 12/27/21 HNM    Enterobacter cloacae complex NOT DETECTED NOT DETECTED Final   Escherichia coli DETECTED (A) NOT DETECTED Final    Comment: CRITICAL RESULT CALLED TO, READ BACK BY AND VERIFIED WITH: DEVAN MITCHELL UXNATF [id number] 12/27/21 HNM    Klebsiella aerogenes NOT DETECTED NOT DETECTED Final   Klebsiella oxytoca NOT DETECTED NOT DETECTED Final   Klebsiella pneumoniae NOT DETECTED NOT DETECTED Final   Proteus species NOT DETECTED NOT DETECTED Final   Salmonella species NOT DETECTED NOT DETECTED Final   Serratia marcescens NOT DETECTED NOT DETECTED Final   Haemophilus influenzae NOT DETECTED NOT DETECTED Final   Neisseria meningitidis NOT DETECTED NOT DETECTED Final   Pseudomonas aeruginosa NOT DETECTED NOT DETECTED Final   Stenotrophomonas maltophilia NOT DETECTED NOT DETECTED Final   Candida albicans NOT DETECTED NOT DETECTED Final   Candida auris NOT DETECTED NOT DETECTED Final   Candida glabrata NOT DETECTED NOT DETECTED Final   Candida krusei NOT DETECTED NOT DETECTED Final   Candida parapsilosis NOT  DETECTED NOT DETECTED Final   Candida tropicalis NOT DETECTED NOT DETECTED Final   Cryptococcus neoformans/gattii NOT DETECTED NOT DETECTED Final   CTX-M  ESBL DETECTED (A) NOT DETECTED Final    Comment: CRITICAL RESULT CALLED TO, READ BACK BY AND VERIFIED WITH: DEVAN MITCHELL PHARMD 1126 12/27/21 HNM (NOTE) Extended spectrum beta-lactamase detected. Recommend a carbapenem as initial therapy.      Carbapenem resistance IMP NOT DETECTED NOT DETECTED Final   Carbapenem resistance KPC NOT DETECTED NOT DETECTED Final   Carbapenem resistance NDM NOT DETECTED NOT DETECTED Final   Carbapenem resist OXA 48 LIKE NOT DETECTED NOT DETECTED Final   Carbapenem resistance VIM NOT DETECTED NOT DETECTED Final    Comment: Performed at [facility], [address] [place] [address] [place], [place] [address]  Urine Culture     Status: Abnormal   Collection Time: 12/27/21  9:59 AM   Specimen: Urine, Clean Catch  Result Value Ref Range Status   Specimen Description   Final    URINE, CLEAN CATCH Performed at [facility], [address] [place], [place] [address]    Special Requests   Final    NONE Performed at [facility], [address] [place], [place] [address]    Culture (A)  Final    <10,000 COLONIES/mL INSIGNIFICANT GROWTH Performed at [facility] [address] [place], [place] [address]    Report Status 12/28/2021 FINAL  Final  MRSA Next Gen by PCR, Nasal     Status: Abnormal   Collection Time: 12/28/21  3:10 PM   Specimen: Nasal Mucosa; Nasal Swab  Result Value Ref Range Status   MRSA by PCR Next Gen DETECTED (A) NOT DETECTED Final    Comment: RESULT CALLED TO, READ BACK BY AND VERIFIED WITH: GINA SHANNON 1701 12/28/21 MU (NOTE) The GeneXpert MRSA Assay (FDA approved for NASAL specimens only), is one component of a comprehensive MRSA colonization surveillance program. It is not intended to diagnose MRSA infection nor to guide or monitor treatment for MRSA  infections. Test performance is not FDA approved in patients less than 33 years old. Performed at [facility], [address] [place], [place] [address]   CULTURE, BLOOD (ROUTINE X 2) w Reflex to ID Panel     Status: None (Preliminary result)   Collection Time: 12/29/21  6:21 AM   Specimen: BLOOD  Result Value Ref Range Status   Specimen Description BLOOD RIGHT [facility]  Final   Special Requests   Final    BOTTLES DRAWN AEROBIC AND ANAEROBIC Blood Culture adequate volume   Culture   Final    NO GROWTH 2 DAYS Performed at [facility], [address] [place], [place] [address]    Report Status PENDING  Incomplete  CULTURE, BLOOD (ROUTINE X 2) w Reflex to ID Panel     Status: None (Preliminary result)   Collection Time: 12/29/21  6:21 AM   Specimen: BLOOD  Result Value Ref Range Status   Specimen Description   Final    BLOOD RIGHT HAND Performed at [facility], [address] [place],  [address]    Special Requests   Final    IN PEDIATRIC BOTTLE Blood Culture adequate volume Performed at [facility], [address] [place],  [address]    Culture  Setup Time   Final    YEAST IN PEDIATRIC BOTTLE Organism ID to follow CRITICAL RESULT CALLED TO, READ BACK BY AND VERIFIED WITH: BRANDON BEERS @1310  12/30/21 SCS    Culture YEAST  Final   Report Status PENDING  Incomplete  Blood Culture ID Panel (Reflexed)  Status: Abnormal   Collection Time: 12/29/21  6:21 AM  Result Value Ref Range Status   Enterococcus faecalis NOT DETECTED NOT DETECTED Final   Enterococcus Faecium NOT DETECTED NOT DETECTED Final   Listeria monocytogenes NOT DETECTED NOT DETECTED Final   Staphylococcus species NOT DETECTED NOT DETECTED Final   Staphylococcus aureus (BCID) NOT DETECTED NOT DETECTED Final   Staphylococcus epidermidis NOT DETECTED NOT DETECTED Final   Staphylococcus lugdunensis NOT DETECTED NOT DETECTED Final   Streptococcus species NOT  DETECTED NOT DETECTED Final   Streptococcus agalactiae NOT DETECTED NOT DETECTED Final   Streptococcus pneumoniae NOT DETECTED NOT DETECTED Final   Streptococcus pyogenes NOT DETECTED NOT DETECTED Final   A.calcoaceticus-baumannii NOT DETECTED NOT DETECTED Final   Bacteroides fragilis NOT DETECTED NOT DETECTED Final   Enterobacterales NOT DETECTED NOT DETECTED Final   Enterobacter cloacae complex NOT DETECTED NOT DETECTED Final   Escherichia coli NOT DETECTED NOT DETECTED Final   Klebsiella aerogenes NOT DETECTED NOT DETECTED Final   Klebsiella oxytoca NOT DETECTED NOT DETECTED Final   Klebsiella pneumoniae NOT DETECTED NOT DETECTED Final   Proteus species NOT DETECTED NOT DETECTED Final   Salmonella species NOT DETECTED NOT DETECTED Final   Serratia marcescens NOT DETECTED NOT DETECTED Final   Haemophilus influenzae NOT DETECTED NOT DETECTED Final   Neisseria meningitidis NOT DETECTED NOT DETECTED Final   Pseudomonas aeruginosa NOT DETECTED NOT DETECTED Final   Stenotrophomonas maltophilia NOT DETECTED NOT DETECTED Final   Candida albicans NOT DETECTED NOT DETECTED Final   Candida auris NOT DETECTED NOT DETECTED Final   Candida glabrata DETECTED (A) NOT DETECTED Final    Comment: CRITICAL RESULT CALLED TO, READ BACK BY AND VERIFIED WITH: BRANDON BEERS @1310  12/30/21 SCS    Candida krusei NOT DETECTED NOT DETECTED Final   Candida parapsilosis NOT DETECTED NOT DETECTED Final   Candida tropicalis NOT DETECTED NOT DETECTED Final   Cryptococcus neoformans/gattii NOT DETECTED NOT DETECTED Final    Comment: Performed at [facility], [address] [place], [place] [address]  CULTURE, BLOOD (ROUTINE X 2) w Reflex to ID Panel     Status: None (Preliminary result)   Collection Time: 12/31/21  3:08 AM   Specimen: BLOOD  Result Value Ref Range Status   Specimen Description BLOOD BLOOD RIGHT HAND  Final   Special Requests   Final    BOTTLES DRAWN AEROBIC AND ANAEROBIC Blood Culture  adequate volume   Culture   Final    NO GROWTH < 12 HOURS Performed at [facility], [address] [place], [place] [address]    Report Status PENDING  Incomplete  CULTURE, BLOOD (ROUTINE X 2) w Reflex to ID Panel     Status: None (Preliminary result)   Collection Time: 12/31/21  3:08 AM   Specimen: BLOOD  Result Value Ref Range Status   Specimen Description BLOOD BLOOD LEFT HAND  Final   Special Requests   Final    BOTTLES DRAWN AEROBIC AND ANAEROBIC Blood Culture adequate volume   Culture   Final    NO GROWTH < 12 HOURS Performed at [facility], [address] [place], [place] [address]    Report Status PENDING  Incomplete    Coagulation Studies: No results for input(s): LABPROT, INR in the last 72 hours.  Urinalysis: No results for input(s): COLORURINE, LABSPEC, PHURINE, GLUCOSEU, HGBUR, BILIRUBINUR, KETONESUR, PROTEINUR, UROBILINOGEN, NITRITE, LEUKOCYTESUR in the last 72 hours.  Invalid input(s): APPERANCEUR    Imaging: No results found.  Medications:    sodium chloride Stopped (12/30/21 1542)   sodium chloride     anidulafungin     lactated ringers     meropenem (MERREM) IV Stopped (12/30/21 2206)    (feeding supplement) PROSource Plus  30 mL Oral BID BM   amiodarone  200 mg Oral BID   aspirin EC  81 mg Oral Daily   atorvastatin  80 mg Oral Daily   Chlorhexidine Gluconate Cloth  6 each Topical Daily   feeding supplement (NEPRO CARB STEADY)  237 mL Oral BID BM   heparin  5,000 Units Subcutaneous Q12H   insulin aspart  0-15 Units Subcutaneous TID WC   insulin aspart  0-5 Units Subcutaneous QHS   insulin detemir  10 Units Subcutaneous Daily   levalbuterol  2 puff Inhalation Q8H   melatonin  5 mg Oral QHS   midodrine  10 mg Oral TID WC   multivitamin with minerals  1 tablet Oral Daily   mupirocin ointment  1 application Nasal BID   pantoprazole  40 mg Oral BID   pneumococcal 23 valent vaccine  0.5 mL Intramuscular Tomorrow-1000    sodium chloride flush  3 mL Intravenous Q12H   sodium chloride flush  3 mL Intravenous Q12H   tamsulosin  0.4 mg Oral Daily   tiotropium  1 capsule Inhalation Daily   traZODone  50 mg Oral QHS   sodium chloride, acetaminophen **OR** acetaminophen, menthol-cetylpyridinium, ondansetron **OR** ondansetron (ZOFRAN) IV, oxybutynin, oxyCODONE, sodium chloride flush  2D echo done on December 13, 2021 Summary    1. An ultrasound enhancing agent was used to improve the visualization of  the left ventricular cavity and endocardial borders.    2. Technically difficult study.    3. The left ventricle is upper normal in size with normal wall thickness.    4. The left ventricular systolic function is severely decreased, LVEF is  visually estimated at 25-30%.    5. There is thinning and akinesis involving the mid inferior and basal  inferior segment(s).    6. There is grade III diastolic dysfunction (severely elevated filling  pressure).    7. The right ventricle is normal in size, with low normal systolic function.    Assessment/ Plan:  Mr. Wayne Burns is a 66 y.o.  male with past medical conditions including COPD and hypertension , who was admitted to [facility] on 12/26/2021 for Atrial fibrillation with rapid ventricular response ([place]) [I48.91] Acute cystitis without hematuria [N30.00] AKI (acute kidney injury) ([place]) [N17.9] Septic shock ([place]) [A41.9, R65.21] Sepsis ([place]) [A41.9] Sepsis, due to unspecified organism, unspecified whether acute organ dysfunction present ([place]) [A41.9]   Acute kidney injury with chronic kidney disease stage IV.  Baseline appears to be 2.8 with GFR 25 on 12/22/2021.  AKI may be due to possible obstruction from migrating stent recently placed at [facility].    Patient creatinine had earlier peaked at 3.8              Patient creatinine is stable at 2.9--3.1            Urine output adequate             For now continue supportive care.              No immediate need for  dialysis.  Lab Results  Component Value Date   CREATININE 2.91 (H) 12/31/2021   CREATININE 2.88 (H) 12/31/2021   CREATININE 3.18 (H) 12/30/2021    Intake/Output Summary (Last 24  hours) at 12/31/2021 0934 Last data filed at 12/31/2021 0700 Gross per 24 hour  Intake 2227.02 ml  Output 2675 ml  Net -447.98 ml   2.  Hypotension.  Currently on amiodarone drip with midodrine schedule III times daily.     3.  Acute metabolic acidosis. Patient bicarb is now better  4.Anemia of chronic disease Patient hemoglobin was down to 6.7 now better  5.Combined systolic and diastolic CHF Patient is currently well compensated  6.COVID 19 Patient is currently asymptomatic Patient is on respiratory precautions Patient is being followed by the team   7.Sepsis Patient was being treated for urosepsis from ESBL E. coli Patient blood culture from 5 the 24th now shows Candida glabrata Patient is now on antifungals ID team and primary team are following  [place] 2/26/20239:34 AM

## 2021-12-31 NOTE — Progress Notes (Signed)
PROGRESS NOTE    Rizwan Kuyper  HYW:[id number] DOB: Sep 09, 1956 DOA: 12/26/2021 PCP: Jerene Pitch, MD    Brief Narrative:  66 year old male admitted with septic shock due to severe urosepsis and ESBL E.Coli BACTEREMIA from bilateral obstructing distal ureteral calculi, along with new onset atrial fibrillation with RVR, acute kidney injury, and metabolic acidosis.  Cardiology, urology, nephrology following.  66 year old male with a past medical history significant for COPD, hypertension, chronic urinary retention with bilateral hydronephrosis dating back to 2020 who was brought to [facility] ED on 12/26/2021 due to family concerns that he was not being able to take care of himself with poor living conditions, refusing to go to hospital, and felt to be a danger to himself.  He was IVC by his family members.   Of note he was recently hospitalized at [facility] for 10 days (12/12/21-12/22/21) for treatment of AKI secondary to bilateral obstructing distal ureteral calculi.  Hospital course was complicated by type II coronary artery disease, sepsis, renal failure, respiratory failure, and melena.  Underwent EGD on 2/16 with evidence of prior gastric ulcers but no acute bleeding.  2/26: Patient has been weaned off vasopressors.  Atrial fibrillation rate controlled on p.o. amiodarone.  Interestingly blood cultures positive for Candida glabrata.  Telemetry infectious disease consult obtained.  Added Eraxis to medication regimen.  Recommending TEE for which cardiology is aware.   Assessment & Plan:   Principal Problem:   Depression Active Problems:   Abnormal LFTs (liver function tests)   Essential hypertension   Sepsis (HCC)   Urinary tract infection   Involuntary commitment   AKI (acute kidney injury) (HCC)   Elevated troponin   Elevated glucose   Atrial fibrillation with RVR (HCC)   Ureteral stent displacement, initial encounter ([place])   COVID-19 virus infection   COPD with acute exacerbation ([place])    Septic shock ([place])  Septic shock ESBL urosepsis Candida glabrata fungemia Patient initially required with the pressor support Now weaned off Likely source of ESBL infection is urine Patient on meropenem On 2/25 blood cultures positive for Candida glabrata, unclear significance or source Plan: Transfer to progressive unit Continue meropenem with pharmacy dosing Continue Eraxis with pharmacy dosing Monitor vitals and fever curve Continue midodrine for blood pressure support  Bilateral obstructive ureteral calculi Right ureteral stent displacement Patient had stents placed at [facility] Noted to have proximal migration of right ureteral stent Has Foley in place placed by urology Plan: Urology following Tentative plan for stent revision Tuesday, February 28  New onset atrial fibrillation Initially required IV amiodarone, now weaned off Plan: Continue p.o. amiodarone 200 mg twice daily Defer anticoagulation for now considering acute anemia CHA2DS2-VASc is 1, continue aspirin 81 mg daily Telemetry monitoring  Acute kidney injury on chronic kidney disease stage IV Metabolic acidosis, improved Renal function has been fluctuating Urine output adequate AKI may be due to obstruction from migrating stent Plan: DC bicarbonate infusion No immediate need for dialysis Nephrology following  Acute on chronic anemia of chronic kidney disease No clear source of blood loss Patient's hemoglobin ranges from high sixes to low sevens Suspect reactive anemia secondary to multiple acute issues Per previous documentation patient had a recent colonoscopy at [facility] without any bleeding identified Plan: Trend daily CBC Daily aspirin as above Okay for DVT prophylaxis  Combined systolic and diastolic congestive heart failure No evidence of exacerbation  History of COVID-19 infection Essentially asymptomatic Out of treatment or isolation window  Acute metabolic encephalopathy Appears  resolved Treat metabolic derangements  and infection Avoid sedating medications Encourage normal sleep/wake cycle Psych consulted during this admission, does not require IVC or inpatient psychiatric hospitalization  Thrombocytopenia Improving No evidence of acute blood loss Okay for VTE prophylaxis Twice daily PPI   DVT prophylaxis: SQ heparin Code Status: Full Family Communication: None today Disposition Plan: Status is: Inpatient Remains inpatient appropriate because: Multiple acute issues, sepsis, resolving shock, atrial fibrillation, needs urologic procedure.  Will be hospitalized for at least the next 3 to 4 days  Level of care: Progressive  Consultants:  Nephrology Cardiology Urology  Procedures:  None  Antimicrobials: Meropenem Eraxis   Subjective: Seen and examined.  Physically in ICU.  Stable no distress.  Vital signs stable.  No drips, weaned off vasopressors.  Objective: Vitals:   12/31/21 0700 12/31/21 0900 12/31/21 1151 12/31/21 1155  BP: 106/62  101/70   Pulse: 95 74 100   Resp: 20 (!) 21 18   Temp:    98.5 F (36.9 C)  TempSrc:    Oral  SpO2: 100% 95% 99%   Weight:      Height:        Intake/Output Summary (Last 24 hours) at 12/31/2021 1234 Last data filed at 12/31/2021 1000 Gross per 24 hour  Intake 2639.48 ml  Output 3225 ml  Net -585.52 ml   Filed Weights   12/26/21 2013 12/28/21 1500  Weight: 72.6 kg 92.6 kg    Examination:  General exam: No acute distress Respiratory system: Bibasilar crackles.  Normal work of breathing.  Room air Cardiovascular system: S1-S2, regular rate, irregular rhythm, no murmurs, no pedal edema Gastrointestinal system: Soft, NT/ND, normal bowel sounds Central nervous system: Alert and oriented. No focal neurological deficits. Extremities: Symmetric 5 x 5 power. Skin: No rashes, lesions or ulcers Psychiatry: Judgement and insight appear normal. Mood & affect appropriate.     Data Reviewed: I have  personally reviewed following labs and imaging studies  CBC: Recent Labs  Lab 12/28/21 0500 12/29/21 0621 12/29/21 0718 12/30/21 0624 12/30/21 0756 12/31/21 0308 12/31/21 0631  WBC 18.5* 19.0*  --  6.9  --  6.5 7.9  NEUTROABS 16.3* 17.5*  --  5.8  --  5.2  --   HGB 8.1* 6.7* 8.3* 6.8* 7.6* 6.2* 7.0*  HCT 25.4* 19.9* 25.0* 20.5* 22.6* 19.1* 21.9*  MCV 87.3 84.7  --  84.4  --  86.0 86.2  PLT 226 181  --  127*  --  114* 937*   Basic Metabolic Panel: Recent Labs  Lab 12/28/21 0500 12/28/21 1101 12/29/21 0621 12/29/21 0716 12/30/21 0624 12/31/21 0308 12/31/21 0631  NA 130*   < > 128* 127* 131* 130* 132*  K 4.2   < > 2.8* 3.7 3.6 3.4* 3.6  CL 105   < > 89* 97* 96* 93* 94*  CO2 14*   < > 29 18* 23 27 28   GLUCOSE 260*   < > 684* 325* 112* 118* 98  BUN 67*   < > 51* 57* 59* 53* 54*  CREATININE 3.86*   < > 2.84* 3.34* 3.18* 2.88* 2.91*  CALCIUM 7.7*   < > 6.1* 7.4* 7.7* 7.5* 7.7*  MG 1.7  --  1.6*  --  2.0 1.9  --   PHOS 4.5  --  3.6  --  3.7 3.9  --    < > = values in this interval not displayed.   GFR: Estimated Creatinine Clearance: 29.2 mL/min (A) (by C-G formula based on SCr of 2.91 mg/dL (  H)). Liver Function Tests: Recent Labs  Lab 12/26/21 2020 12/27/21 0532 12/29/21 0621 12/29/21 0716 12/30/21 0624 12/31/21 0308  AST 31 28  --  18  --   --   ALT 55* 44  --  35  --   --   ALKPHOS 204* 167*  --  157*  --   --   BILITOT 2.1* 1.5*  --  0.7  --   --   PROT 8.1 6.8  --  7.0  --   --   ALBUMIN 2.7* 2.2* 1.8* 2.3* 2.1* 2.0*   No results for input(s): LIPASE, AMYLASE in the last 168 hours. No results for input(s): AMMONIA in the last 168 hours. Coagulation Profile: No results for input(s): INR, PROTIME in the last 168 hours. Cardiac Enzymes: Recent Labs  Lab 12/26/21 2020  CKTOTAL 10*   BNP (last 3 results) No results for input(s): PROBNP in the last 8760 hours. HbA1C: No results for input(s): HGBA1C in the last 72 hours. CBG: Recent Labs  Lab  12/30/21 0740 12/30/21 1136 12/30/21 1636 12/30/21 2132 12/31/21 1152  GLUCAP 120* 161* 129* 92 116*   Lipid Profile: No results for input(s): CHOL, HDL, LDLCALC, TRIG, CHOLHDL, LDLDIRECT in the last 72 hours. Thyroid Function Tests: No results for input(s): TSH, T4TOTAL, FREET4, T3FREE, THYROIDAB in the last 72 hours. Anemia Panel: No results for input(s): VITAMINB12, FOLATE, FERRITIN, TIBC, IRON, RETICCTPCT in the last 72 hours. Sepsis Labs: Recent Labs  Lab 12/27/21 1108 12/27/21 1407 12/28/21 0500 12/28/21 1100 12/28/21 1613 12/28/21 2157 12/29/21 0621  PROCALCITON 1.78  --  1.43  --   --   --  0.67  LATICACIDVEN  --    < > 2.0* 2.1* 2.2* 2.1* 1.8   < > = values in this interval not displayed.    Recent Results (from the past 240 hour(s))  Resp Panel by RT-PCR (Flu A&B, Covid)     Status: Abnormal   Collection Time: 12/26/21 10:33 PM   Specimen: Nasopharyngeal(NP) swabs in vial transport medium  Result Value Ref Range Status   SARS Coronavirus 2 by RT PCR POSITIVE (A) NEGATIVE Final    Comment: (NOTE) SARS-CoV-2 target nucleic acids are DETECTED.  The SARS-CoV-2 RNA is generally detectable in upper respiratory specimens during the acute phase of infection. Positive results are indicative of the presence of the identified virus, but do not rule out bacterial infection or co-infection with other pathogens not detected by the test. Clinical correlation with patient history and other diagnostic information is necessary to determine patient infection status. The expected result is Negative.  Fact Sheet for Patients: EntrepreneurPulse.com.au  Fact Sheet for Healthcare Providers: IncredibleEmployment.be  This test is not yet approved or cleared by the [place] FDA and  has been authorized for detection and/or diagnosis of SARS-CoV-2 by FDA under an Emergency Use Authorization (EUA).  This EUA will remain in effect (meaning  this test can be used) for the duration of  the COVID-19 declaration under Section 564(b)(1) of the A ct, 21 U.S.C. section 360bbb-3(b)(1), unless the authorization is terminated or revoked sooner.     Influenza A by PCR NEGATIVE NEGATIVE Final   Influenza B by PCR NEGATIVE NEGATIVE Final    Comment: (NOTE) The Xpert Xpress SARS-CoV-2/FLU/RSV plus assay is intended as an aid in the diagnosis of influenza from Nasopharyngeal swab specimens and should not be used as a sole basis for treatment. Nasal washings and aspirates are unacceptable for Xpert Xpress SARS-CoV-2/FLU/RSV testing.  Fact Sheet for Patients: EntrepreneurPulse.com.au  Fact Sheet for Healthcare Providers: IncredibleEmployment.be  This test is not yet approved or cleared by the [place] FDA and has been authorized for detection and/or diagnosis of SARS-CoV-2 by FDA under an Emergency Use Authorization (EUA). This EUA will remain in effect (meaning this test can be used) for the duration of the COVID-19 declaration under Section 564(b)(1) of the Act, 21 U.S.C. section 360bbb-3(b)(1), unless the authorization is terminated or revoked.  Performed at [facility], [address] [place], [place] [address]   Blood culture (routine x 2)     Status: None   Collection Time: 12/26/21 10:33 PM   Specimen: BLOOD  Result Value Ref Range Status   Specimen Description BLOOD LEFT ASSIST CONTROL  Final   Special Requests   Final    BOTTLES DRAWN AEROBIC AND ANAEROBIC Blood Culture adequate volume   Culture   Final    NO GROWTH 5 DAYS Performed at [facility], [address], [place], [place] [address]    Report Status 12/31/2021 FINAL  Final  Blood culture (routine x 2)     Status: Abnormal   Collection Time: 12/26/21 10:46 PM   Specimen: BLOOD  Result Value Ref Range Status   Specimen Description   Final    BLOOD RIGHT ANTECUBITAL Performed at [facility], [place] [address], [place], [place] [address]    Special Requests   Final    BOTTLES DRAWN AEROBIC AND ANAEROBIC Blood Culture adequate volume Performed at [facility], [address] [place], [place] [address]    Culture  Setup Time   Final    GRAM NEGATIVE RODS ANAEROBIC BOTTLE ONLY Organism ID to follow CRITICAL RESULT CALLED TO, READ BACK BY AND VERIFIED WITH: [facility] MITCHELL [id number] 12/27/21 HNM    Culture (A)  Final    ESCHERICHIA COLI Confirmed Extended Spectrum Beta-Lactamase Producer (ESBL).  In bloodstream infections from ESBL organisms, carbapenems are preferred over piperacillin/tazobactam. They are shown to have a lower risk of mortality.    Report Status 12/29/2021 FINAL  Final   Organism ID, Bacteria ESCHERICHIA COLI  Final      Susceptibility   Escherichia coli - MIC*    AMPICILLIN >=32 RESISTANT Resistant     CEFAZOLIN >=64 RESISTANT Resistant     CEFEPIME 16 RESISTANT Resistant     CEFTAZIDIME RESISTANT Resistant     CEFTRIAXONE >=64 RESISTANT Resistant     CIPROFLOXACIN <=0.25 SENSITIVE Sensitive     GENTAMICIN <=1 SENSITIVE Sensitive     IMIPENEM <=0.25 SENSITIVE Sensitive     TRIMETH/SULFA <=20 SENSITIVE Sensitive     AMPICILLIN/SULBACTAM 8 SENSITIVE Sensitive     PIP/TAZO <=4 SENSITIVE Sensitive     * ESCHERICHIA COLI  Blood Culture ID Panel (Reflexed)     Status: Abnormal   Collection Time: 12/26/21 10:46 PM  Result Value Ref Range Status   Enterococcus faecalis NOT DETECTED NOT DETECTED Final   Enterococcus Faecium NOT DETECTED NOT DETECTED Final   Listeria monocytogenes NOT DETECTED NOT DETECTED Final   Staphylococcus species NOT DETECTED NOT DETECTED Final   Staphylococcus aureus (BCID) NOT DETECTED NOT DETECTED Final   Staphylococcus epidermidis NOT DETECTED NOT DETECTED Final   Staphylococcus lugdunensis NOT DETECTED NOT DETECTED Final   Streptococcus species NOT DETECTED NOT DETECTED Final   Streptococcus agalactiae NOT  DETECTED NOT DETECTED Final   Streptococcus pneumoniae NOT DETECTED NOT DETECTED Final   Streptococcus pyogenes NOT DETECTED NOT  DETECTED Final   A.calcoaceticus-baumannii NOT DETECTED NOT DETECTED Final   Bacteroides fragilis NOT DETECTED NOT DETECTED Final   Enterobacterales DETECTED (A) NOT DETECTED Final    Comment: Enterobacterales represent a large order of gram negative bacteria, not a single organism. CRITICAL RESULT CALLED TO, READ BACK BY AND VERIFIED WITH: DEVAN MITCHELL PHARMD 1126 12/27/21 HNM    Enterobacter cloacae complex NOT DETECTED NOT DETECTED Final   Escherichia coli DETECTED (A) NOT DETECTED Final    Comment: CRITICAL RESULT CALLED TO, READ BACK BY AND VERIFIED WITH: DEVAN MITCHELL [id number] 12/27/21 HNM    Klebsiella aerogenes NOT DETECTED NOT DETECTED Final   Klebsiella oxytoca NOT DETECTED NOT DETECTED Final   Klebsiella pneumoniae NOT DETECTED NOT DETECTED Final   Proteus species NOT DETECTED NOT DETECTED Final   Salmonella species NOT DETECTED NOT DETECTED Final   Serratia marcescens NOT DETECTED NOT DETECTED Final   Haemophilus influenzae NOT DETECTED NOT DETECTED Final   Neisseria meningitidis NOT DETECTED NOT DETECTED Final   Pseudomonas aeruginosa NOT DETECTED NOT DETECTED Final   Stenotrophomonas maltophilia NOT DETECTED NOT DETECTED Final   Candida albicans NOT DETECTED NOT DETECTED Final   Candida auris NOT DETECTED NOT DETECTED Final   Candida glabrata NOT DETECTED NOT DETECTED Final   Candida krusei NOT DETECTED NOT DETECTED Final   Candida parapsilosis NOT DETECTED NOT DETECTED Final   Candida tropicalis NOT DETECTED NOT DETECTED Final   Cryptococcus neoformans/gattii NOT DETECTED NOT DETECTED Final   CTX-M ESBL DETECTED (A) NOT DETECTED Final    Comment: CRITICAL RESULT CALLED TO, READ BACK BY AND VERIFIED WITH: DEVAN MITCHELL PHARMD 1126 12/27/21 HNM (NOTE) Extended spectrum beta-lactamase detected. Recommend a carbapenem as initial  therapy.      Carbapenem resistance IMP NOT DETECTED NOT DETECTED Final   Carbapenem resistance KPC NOT DETECTED NOT DETECTED Final   Carbapenem resistance NDM NOT DETECTED NOT DETECTED Final   Carbapenem resist OXA 48 LIKE NOT DETECTED NOT DETECTED Final   Carbapenem resistance VIM NOT DETECTED NOT DETECTED Final    Comment: Performed at [facility], [address] [place], [place] [address]  Urine Culture     Status: Abnormal   Collection Time: 12/27/21  9:59 AM   Specimen: Urine, Clean Catch  Result Value Ref Range Status   Specimen Description   Final    URINE, CLEAN CATCH Performed at [facility], [address] [place], [place] [address]    Special Requests   Final    NONE Performed at [facility], [address], [place], [place] [address]    Culture (A)  Final    <10,000 COLONIES/mL INSIGNIFICANT GROWTH Performed at [facility] [address] [place], [place] [address]    Report Status 12/28/2021 FINAL  Final  MRSA Next Gen by PCR, Nasal     Status: Abnormal   Collection Time: 12/28/21  3:10 PM   Specimen: Nasal Mucosa; Nasal Swab  Result Value Ref Range Status   MRSA by PCR Next Gen DETECTED (A) NOT DETECTED Final    Comment: RESULT CALLED TO, READ BACK BY AND VERIFIED WITH: GINA SHANNON 1701 12/28/21 MU (NOTE) The GeneXpert MRSA Assay (FDA approved for NASAL specimens only), is one component of a comprehensive MRSA colonization surveillance program. It is not intended to diagnose MRSA infection nor to guide or monitor treatment for MRSA infections. Test performance is not FDA approved in patients less than 17 years old. Performed at [facility],  [place], [place] [address]   CULTURE, BLOOD (ROUTINE X 2) w Reflex to ID Panel     Status: None (Preliminary result)   Collection Time: 12/29/21  6:21 AM   Specimen: BLOOD  Result Value Ref Range Status   Specimen Description BLOOD RIGHT [facility]  Final    Special Requests   Final    BOTTLES DRAWN AEROBIC AND ANAEROBIC Blood Culture adequate volume   Culture   Final    NO GROWTH 2 DAYS Performed at [facility], [address] [place], [place] [address]    Report Status PENDING  Incomplete  CULTURE, BLOOD (ROUTINE X 2) w Reflex to ID Panel     Status: None (Preliminary result)   Collection Time: 12/29/21  6:21 AM   Specimen: BLOOD  Result Value Ref Range Status   Specimen Description   Final    BLOOD RIGHT HAND Performed at [facility], [address], [place], [place] [address]    Special Requests   Final    IN PEDIATRIC BOTTLE Blood Culture adequate volume Performed at [facility], [address] [place], [place] [address]    Culture  Setup Time   Final    YEAST IN PEDIATRIC BOTTLE Organism ID to follow CRITICAL RESULT CALLED TO, READ BACK BY AND VERIFIED WITH: BRANDON BEERS @1310  12/30/21 SCS    Culture   Final    YEAST TOO YOUNG TO READ Performed at [facility], [place] [address] [place], [place] [address]    Report Status PENDING  Incomplete  Blood Culture ID Panel (Reflexed)     Status: Abnormal   Collection Time: 12/29/21  6:21 AM  Result Value Ref Range Status   Enterococcus faecalis NOT DETECTED NOT DETECTED Final   Enterococcus Faecium NOT DETECTED NOT DETECTED Final   Listeria monocytogenes NOT DETECTED NOT DETECTED Final   Staphylococcus species NOT DETECTED NOT DETECTED Final   Staphylococcus aureus (BCID) NOT DETECTED NOT DETECTED Final   Staphylococcus epidermidis NOT DETECTED NOT DETECTED Final   Staphylococcus lugdunensis NOT DETECTED NOT DETECTED Final   Streptococcus species NOT DETECTED NOT DETECTED Final   Streptococcus agalactiae NOT DETECTED NOT DETECTED Final   Streptococcus pneumoniae NOT DETECTED NOT DETECTED Final   Streptococcus pyogenes NOT DETECTED NOT DETECTED Final   A.calcoaceticus-baumannii NOT DETECTED NOT DETECTED Final   Bacteroides fragilis NOT  DETECTED NOT DETECTED Final   Enterobacterales NOT DETECTED NOT DETECTED Final   Enterobacter cloacae complex NOT DETECTED NOT DETECTED Final   Escherichia coli NOT DETECTED NOT DETECTED Final   Klebsiella aerogenes NOT DETECTED NOT DETECTED Final   Klebsiella oxytoca NOT DETECTED NOT DETECTED Final   Klebsiella pneumoniae NOT DETECTED NOT DETECTED Final   Proteus species NOT DETECTED NOT DETECTED Final   Salmonella species NOT DETECTED NOT DETECTED Final   Serratia marcescens NOT DETECTED NOT DETECTED Final   Haemophilus influenzae NOT DETECTED NOT DETECTED Final   Neisseria meningitidis NOT DETECTED NOT DETECTED Final   Pseudomonas aeruginosa NOT DETECTED NOT DETECTED Final   Stenotrophomonas maltophilia NOT DETECTED NOT DETECTED Final   Candida albicans NOT DETECTED NOT DETECTED Final   Candida auris NOT DETECTED NOT DETECTED Final   Candida glabrata DETECTED (A) NOT DETECTED Final    Comment: CRITICAL RESULT CALLED TO, READ BACK BY AND VERIFIED WITH: BRANDON BEERS @1310  12/30/21 SCS    Candida krusei NOT DETECTED NOT DETECTED Final   Candida parapsilosis NOT DETECTED NOT DETECTED Final   Candida  tropicalis NOT DETECTED NOT DETECTED Final   Cryptococcus neoformans/gattii NOT DETECTED NOT DETECTED Final    Comment: Performed at [facility], [address] [place], [place] [address]  CULTURE, BLOOD (ROUTINE X 2) w Reflex to ID Panel     Status: None (Preliminary result)   Collection Time: 12/31/21  3:08 AM   Specimen: BLOOD  Result Value Ref Range Status   Specimen Description BLOOD BLOOD RIGHT HAND  Final   Special Requests   Final    BOTTLES DRAWN AEROBIC AND ANAEROBIC Blood Culture adequate volume   Culture   Final    NO GROWTH < 12 HOURS Performed at [facility], [address] [place], [place] [address]    Report Status PENDING  Incomplete  CULTURE, BLOOD (ROUTINE X 2) w Reflex to ID Panel     Status: None (Preliminary result)   Collection Time:  12/31/21  3:08 AM   Specimen: BLOOD  Result Value Ref Range Status   Specimen Description BLOOD BLOOD LEFT HAND  Final   Special Requests   Final    BOTTLES DRAWN AEROBIC AND ANAEROBIC Blood Culture adequate volume   Culture   Final    NO GROWTH < 12 HOURS Performed at [facility], [address], [place], [place] [address]    Report Status PENDING  Incomplete         Radiology Studies: No results found.      Scheduled Meds:  (feeding supplement) PROSource Plus  30 mL Oral BID BM   amiodarone  200 mg Oral BID   aspirin EC  81 mg Oral Daily   atorvastatin  80 mg Oral Daily   Chlorhexidine Gluconate Cloth  6 each Topical Daily   feeding supplement (NEPRO CARB STEADY)  237 mL Oral BID BM   heparin  5,000 Units Subcutaneous Q12H   insulin aspart  0-15 Units Subcutaneous TID WC   insulin aspart  0-5 Units Subcutaneous QHS   insulin detemir  10 Units Subcutaneous Daily   levalbuterol  2 puff Inhalation Q8H   melatonin  5 mg Oral QHS   midodrine  10 mg Oral TID WC   multivitamin with minerals  1 tablet Oral Daily   mupirocin ointment  1 application Nasal BID   pantoprazole  40 mg Oral BID   pneumococcal 23 valent vaccine  0.5 mL Intramuscular Tomorrow-1000   potassium chloride  40 mEq Oral Once   sertraline  100 mg Oral BID   sodium chloride flush  3 mL Intravenous Q12H   sodium chloride flush  3 mL Intravenous Q12H   tamsulosin  0.4 mg Oral Daily   tiotropium  1 capsule Inhalation Daily   traZODone  50 mg Oral QHS   Continuous Infusions:  sodium chloride Stopped (12/30/21 1542)   sodium chloride     anidulafungin     lactated ringers 75 mL/hr at 12/31/21 1141   magnesium sulfate bolus IVPB     meropenem (MERREM) IV Stopped (12/30/21 2206)     LOS: 4 days      Sidney Ace, MD [facility]   If 7PM-7AM, please contact night-coverage  12/31/2021, 12:34 PM

## 2022-01-01 ENCOUNTER — Encounter
Admission: EM | Disposition: A | Discharge status: Home / Self Care | Payer: Self-pay | Source: Home / Self Care | Attending: Internal Medicine

## 2022-01-01 ENCOUNTER — Inpatient Hospital Stay
Admit: 2022-01-01 | Discharge: 2022-01-01 | Disposition: A | Discharge status: Home / Self Care | Payer: Medicare Other | Attending: Internal Medicine | Admitting: Internal Medicine

## 2022-01-01 DIAGNOSIS — R6521 Severe sepsis with septic shock: Secondary | ICD-10-CM | POA: Diagnosis not present

## 2022-01-01 DIAGNOSIS — T83122A Displacement of urinary stent, initial encounter: Secondary | ICD-10-CM | POA: Diagnosis not present

## 2022-01-01 DIAGNOSIS — N179 Acute kidney failure, unspecified: Secondary | ICD-10-CM | POA: Diagnosis not present

## 2022-01-01 DIAGNOSIS — A419 Sepsis, unspecified organism: Secondary | ICD-10-CM | POA: Diagnosis not present

## 2022-01-01 LAB — CBC WITH DIFFERENTIAL/PLATELET
Abs Immature Granulocytes: 0.03 10*3/uL (ref 0.00–0.07)
Basophils Absolute: 0 10*3/uL (ref 0.0–0.1)
Basophils Relative: 1 %
Eosinophils Absolute: 0.2 10*3/uL (ref 0.0–0.5)
Eosinophils Relative: 3 %
HCT: 21.2 % — ABNORMAL LOW (ref 39.0–52.0)
Hemoglobin: 6.7 g/dL — ABNORMAL LOW (ref 13.0–17.0)
Immature Granulocytes: 1 %
Lymphocytes Relative: 16 %
Lymphs Abs: 1 10*3/uL (ref 0.7–4.0)
MCH: 27.8 pg (ref 26.0–34.0)
MCHC: 31.6 g/dL (ref 30.0–36.0)
MCV: 88 fL (ref 80.0–100.0)
Monocytes Absolute: 0.2 10*3/uL (ref 0.1–1.0)
Monocytes Relative: 3 %
Neutro Abs: 4.9 10*3/uL (ref 1.7–7.7)
Neutrophils Relative %: 76 %
Platelets: 128 10*3/uL — ABNORMAL LOW (ref 150–400)
RBC: 2.41 MIL/uL — ABNORMAL LOW (ref 4.22–5.81)
RDW: 18.6 % — ABNORMAL HIGH (ref 11.5–15.5)
WBC: 6.4 10*3/uL (ref 4.0–10.5)
nRBC: 0 % (ref 0.0–0.2)

## 2022-01-01 LAB — MAGNESIUM: Magnesium: 1.8 mg/dL (ref 1.7–2.4)

## 2022-01-01 LAB — RENAL FUNCTION PANEL
Albumin: 2.2 g/dL — ABNORMAL LOW (ref 3.5–5.0)
Anion gap: 13 (ref 5–15)
BUN: 48 mg/dL — ABNORMAL HIGH (ref 8–23)
CO2: 26 mmol/L (ref 22–32)
Calcium: 8.2 mg/dL — ABNORMAL LOW (ref 8.9–10.3)
Chloride: 95 mmol/L — ABNORMAL LOW (ref 98–111)
Creatinine, Ser: 2.9 mg/dL — ABNORMAL HIGH (ref 0.61–1.24)
GFR, Estimated: 24 mL/min — ABNORMAL LOW (ref >60)
Glucose, Bld: 95 mg/dL (ref 70–99)
Phosphorus: 4.3 mg/dL (ref 2.5–4.6)
Potassium: 3.9 mmol/L (ref 3.5–5.1)
Sodium: 134 mmol/L — ABNORMAL LOW (ref 135–145)

## 2022-01-01 LAB — HEMOGLOBIN AND HEMATOCRIT, BLOOD
HCT: 22.7 % — ABNORMAL LOW (ref 39.0–52.0)
Hemoglobin: 7.4 g/dL — ABNORMAL LOW (ref 13.0–17.0)

## 2022-01-01 LAB — BASIC METABOLIC PANEL
Anion gap: 9 (ref 5–15)
BUN: 46 mg/dL — ABNORMAL HIGH (ref 8–23)
CO2: 28 mmol/L (ref 22–32)
Calcium: 7.9 mg/dL — ABNORMAL LOW (ref 8.9–10.3)
Chloride: 95 mmol/L — ABNORMAL LOW (ref 98–111)
Creatinine, Ser: 2.78 mg/dL — ABNORMAL HIGH (ref 0.61–1.24)
GFR, Estimated: 25 mL/min — ABNORMAL LOW (ref >60)
Glucose, Bld: 120 mg/dL — ABNORMAL HIGH (ref 70–99)
Potassium: 4 mmol/L (ref 3.5–5.1)
Sodium: 132 mmol/L — ABNORMAL LOW (ref 135–145)

## 2022-01-01 LAB — ECHOCARDIOGRAM COMPLETE
AR max vel: 2.94 cm2
AV Area VTI: 2.6 cm2
AV Area mean vel: 2.87 cm2
AV Mean grad: 2.5 mmHg
AV Peak grad: 4.1 mmHg
Ao pk vel: 1.01 m/s
Area-P 1/2: 6.02 cm2
Height: 69 in
MV VTI: 1.49 cm2
S' Lateral: 3.3 cm
Weight: 3225.6 oz

## 2022-01-01 LAB — GLUCOSE, CAPILLARY
Glucose-Capillary: 99 mg/dL (ref 70–99)
Glucose-Capillary: 154 mg/dL — ABNORMAL HIGH (ref 70–99)
Glucose-Capillary: 121 mg/dL — ABNORMAL HIGH (ref 70–99)
Glucose-Capillary: 140 mg/dL — ABNORMAL HIGH (ref 70–99)

## 2022-01-01 LAB — PREPARE RBC (CROSSMATCH)

## 2022-01-01 SURGERY — ECHOCARDIOGRAM, TRANSESOPHAGEAL
Anesthesia: Moderate Sedation

## 2022-01-01 MED ORDER — MIDODRINE HCL 5 MG PO TABS
5.0000 mg | ORAL_TABLET | Freq: Three times a day (TID) | ORAL | Status: DC
  Administered 2022-01-01 – 2022-01-14 (×37): 5 mg via ORAL
  Filled 2022-01-01 (×33): qty 1

## 2022-01-01 MED ORDER — SODIUM CHLORIDE 0.9% IV SOLUTION: Freq: Once | INTRAVENOUS | Status: AC

## 2022-01-01 NOTE — Discharge Instructions (Signed)

## 2022-01-01 NOTE — Progress Notes (Signed)
[facility]    SUBJECTIVE: Patient sleeping and resting comfortably preop for surface echocardiogram consider TEE if any concern for SBE   Vitals:   01/01/22 0027 01/01/22 0450 01/01/22 1013 01/01/22 1129  BP: 110/74 108/77 (!) 111/57 103/64  Pulse: 97 90 (!) 103 94  Resp: 17 18  18   Temp: 97.7 F (36.5 C) 97.8 F (36.6 C) 98 F (36.7 C) 98.9 F (37.2 C)  TempSrc: Oral Oral Oral Oral  SpO2: 100% 100% 100% 99%  Weight:      Height:         Intake/Output Summary (Last 24 hours) at 01/01/2022 1154 Last data filed at 01/01/2022 0700 Gross per 24 hour  Intake 1447.23 ml  Output 3410 ml  Net -1962.77 ml      PHYSICAL EXAM  General: Well developed, well nourished, in no acute distress HEENT:  Normocephalic and atramatic Neck:  No JVD.  Lungs: Clear bilaterally to auscultation and percussion. Heart: irregularly irregular. Normal S1 and S2 without gallops or 2/6 sem murmurs.  Abdomen: Bowel sounds are positive, abdomen soft and non-tender  Msk:  Back normal, normal gait. Normal strength and tone for age. Extremities: No clubbing, cyanosis or edema.   Neuro: Alert and oriented X 3. Psych:  Good affect, responds appropriately   LABS: Basic Metabolic Panel: Recent Labs    12/30/21 0624 12/31/21 0308 12/31/21 0631  NA 131* 130* 132*  K 3.6 3.4* 3.6  CL 96* 93* 94*  CO2 23 27 28   GLUCOSE 112* 118* 98  BUN 59* 53* 54*  CREATININE 3.18* 2.88* 2.91*  CALCIUM 7.7* 7.5* 7.7*  MG 2.0 1.9  --   PHOS 3.7 3.9  --    Liver Function Tests: Recent Labs    12/30/21 0624 12/31/21 0308  ALBUMIN 2.1* 2.0*   No results for input(s): LIPASE, AMYLASE in the last 72 hours. CBC: Recent Labs    12/31/21 0308 12/31/21 0631 01/01/22 0529  WBC 6.5 7.9 6.4  NEUTROABS 5.2  --  4.9  HGB 6.2* 7.0* 6.7*  HCT 19.1* 21.9* 21.2*  MCV 86.0 86.2 88.0  PLT 114* 128* 128*   Cardiac Enzymes: No results for input(s): CKTOTAL, CKMB, CKMBINDEX, TROPONINI in the last 72  hours. BNP: Invalid input(s): POCBNP D-Dimer: No results for input(s): DDIMER in the last 72 hours. Hemoglobin A1C: No results for input(s): HGBA1C in the last 72 hours. Fasting Lipid Panel: No results for input(s): CHOL, HDL, LDLCALC, TRIG, CHOLHDL, LDLDIRECT in the last 72 hours. Thyroid Function Tests: No results for input(s): TSH, T4TOTAL, T3FREE, THYROIDAB in the last 72 hours.  Invalid input(s): FREET3 Anemia Panel: Recent Labs    12/31/21 1239  VITAMINB12 865  FOLATE 4.3*  FERRITIN 196  TIBC 270  IRON 16*    No results found.   Echo pending  TELEMETRY: Atrial fibrillation rate of 90:  ASSESSMENT AND PLAN:  Principal Problem:   Depression Active Problems:   Abnormal LFTs (liver function tests)   Essential hypertension   Sepsis (HCC)   Urinary tract infection   Involuntary commitment   AKI (acute kidney injury) (HCC)   Elevated troponin   Elevated glucose   Atrial fibrillation with RVR (HCC)   Ureteral stent displacement, initial encounter ([place])   COVID-19 virus infection   COPD with acute exacerbation ([place])   Septic shock ([place])     Plan Evidence of sepsis related to yeast recommend surface echocardiogram continue consider TEE if positive Continue hypertension management and control Acute on  chronic renal insufficiency continue hydration Renal colic management as per nephrology and urology Continue broad-spectrum antibiotic therapy History of atrial fibrillation continue rate control consider anticoagulation    Yolonda Kida, MD 01/01/2022 11:54 AM

## 2022-01-01 NOTE — Progress Notes (Signed)
[facility]  ROUNDING NOTE   Subjective:   Renal function appears stable Urine output recorded of 3.96 L in preceding 24 hours. Tolerating meals and denies shortness of breath  02/26 0701 - 02/27 0700 In: 1912.4 [P.O.:1180; I.V.:301.9; IV Piggyback:430.5] Out: 8299 [Urine:3960] Lab Results  Component Value Date   CREATININE 2.91 (H) 12/31/2021   CREATININE 2.88 (H) 12/31/2021   CREATININE 3.18 (H) 12/30/2021     Objective:  Vital signs in last 24 hours:  Temp:  [97.7 F (36.5 C)-98.9 F (37.2 C)] 98.9 F (37.2 C) (02/27 1129) Pulse Rate:  [74-103] 94 (02/27 1129) Resp:  [17-19] 18 (02/27 1129) BP: (97-113)/(57-77) 103/64 (02/27 1129) SpO2:  [98 %-100 %] 99 % (02/27 1129) Weight:  [91.4 kg] 91.4 kg (02/26 1741)  Weight change:  Filed Weights   12/26/21 2013 12/28/21 1500 12/31/21 1741  Weight: 72.6 kg 92.6 kg 91.4 kg    Intake/Output: I/O last 3 completed shifts: In: 3060 [P.O.:1180; I.V.:1207.7; IV Piggyback:672.3] Out: 5160 [Urine:5160]   Intake/Output this shift:  No intake/output data recorded.  Physical Exam: General: NAD  Head: Normocephalic, atraumatic. Moist oral mucosal membranes  Eyes: Anicteric  Lungs:  Normal effort, clear bilaterally  Heart: Regular rate and rhythm  Abdomen:  Soft, nontender, obese  Extremities: No peripheral edema.  Neurologic: Nonfocal, moving all four extremities  Skin: No acute skin rash  GU Foley catheter in place    Basic Metabolic Panel: Recent Labs  Lab 12/28/21 0500 12/28/21 1101 12/29/21 0621 12/29/21 0716 12/30/21 0624 12/31/21 0308 12/31/21 0631  NA 130*   < > 128* 127* 131* 130* 132*  K 4.2   < > 2.8* 3.7 3.6 3.4* 3.6  CL 105   < > 89* 97* 96* 93* 94*  CO2 14*   < > 29 18* 23 27 28   GLUCOSE 260*   < > 684* 325* 112* 118* 98  BUN 67*   < > 51* 57* 59* 53* 54*  CREATININE 3.86*   < > 2.84* 3.34* 3.18* 2.88* 2.91*  CALCIUM 7.7*   < > 6.1* 7.4* 7.7* 7.5* 7.7*  MG 1.7  --  1.6*  --  2.0 1.9   --   PHOS 4.5  --  3.6  --  3.7 3.9  --    < > = values in this interval not displayed.     Liver Function Tests: Recent Labs  Lab 12/26/21 2020 12/27/21 0532 12/29/21 0621 12/29/21 0716 12/30/21 0624 12/31/21 0308  AST 31 28  --  18  --   --   ALT 55* 44  --  35  --   --   ALKPHOS 204* 167*  --  157*  --   --   BILITOT 2.1* 1.5*  --  0.7  --   --   PROT 8.1 6.8  --  7.0  --   --   ALBUMIN 2.7* 2.2* 1.8* 2.3* 2.1* 2.0*    No results for input(s): LIPASE, AMYLASE in the last 168 hours. No results for input(s): AMMONIA in the last 168 hours.  CBC: Recent Labs  Lab 12/28/21 0500 12/29/21 0621 12/29/21 0718 12/30/21 0624 12/30/21 0756 12/31/21 0308 12/31/21 0631 01/01/22 0529  WBC 18.5* 19.0*  --  6.9  --  6.5 7.9 6.4  NEUTROABS 16.3* 17.5*  --  5.8  --  5.2  --  4.9  HGB 8.1* 6.7*   < > 6.8* 7.6* 6.2* 7.0* 6.7*  HCT 25.4* 19.9*   < >  20.5* 22.6* 19.1* 21.9* 21.2*  MCV 87.3 84.7  --  84.4  --  86.0 86.2 88.0  PLT 226 181  --  127*  --  114* 128* 128*   < > = values in this interval not displayed.     Cardiac Enzymes: Recent Labs  Lab 12/26/21 2020  CKTOTAL 10*     BNP: Invalid input(s): POCBNP  CBG: Recent Labs  Lab 12/31/21 1152 12/31/21 1745 12/31/21 2033 01/01/22 0840 01/01/22 1247  GLUCAP 116* 131* 111* 99 154*     Microbiology: Results for orders placed or performed during the hospital encounter of 12/26/21  Resp Panel by RT-PCR (Flu A&B, Covid)     Status: Abnormal   Collection Time: 12/26/21 10:33 PM   Specimen: Nasopharyngeal(NP) swabs in vial transport medium  Result Value Ref Range Status   SARS Coronavirus 2 by RT PCR POSITIVE (A) NEGATIVE Final    Comment: (NOTE) SARS-CoV-2 target nucleic acids are DETECTED.  The SARS-CoV-2 RNA is generally detectable in upper respiratory specimens during the acute phase of infection. Positive results are indicative of the presence of the identified virus, but do not rule out bacterial  infection or co-infection with other pathogens not detected by the test. Clinical correlation with patient history and other diagnostic information is necessary to determine patient infection status. The expected result is Negative.  Fact Sheet for Patients: EntrepreneurPulse.com.au  Fact Sheet for Healthcare Providers: IncredibleEmployment.be  This test is not yet approved or cleared by the [place] FDA and  has been authorized for detection and/or diagnosis of SARS-CoV-2 by FDA under an Emergency Use Authorization (EUA).  This EUA will remain in effect (meaning this test can be used) for the duration of  the COVID-19 declaration under Section 564(b)(1) of the A ct, 21 U.S.C. section 360bbb-3(b)(1), unless the authorization is terminated or revoked sooner.     Influenza A by PCR NEGATIVE NEGATIVE Final   Influenza B by PCR NEGATIVE NEGATIVE Final    Comment: (NOTE) The Xpert Xpress SARS-CoV-2/FLU/RSV plus assay is intended as an aid in the diagnosis of influenza from Nasopharyngeal swab specimens and should not be used as a sole basis for treatment. Nasal washings and aspirates are unacceptable for Xpert Xpress SARS-CoV-2/FLU/RSV testing.  Fact Sheet for Patients: EntrepreneurPulse.com.au  Fact Sheet for Healthcare Providers: IncredibleEmployment.be  This test is not yet approved or cleared by the [place] FDA and has been authorized for detection and/or diagnosis of SARS-CoV-2 by FDA under an Emergency Use Authorization (EUA). This EUA will remain in effect (meaning this test can be used) for the duration of the COVID-19 declaration under Section 564(b)(1) of the Act, 21 U.S.C. section 360bbb-3(b)(1), unless the authorization is terminated or revoked.  Performed at [facility], [address] [place], [place] [address]   Blood culture (routine x 2)     Status: None   Collection  Time: 12/26/21 10:33 PM   Specimen: BLOOD  Result Value Ref Range Status   Specimen Description BLOOD LEFT ASSIST CONTROL  Final   Special Requests   Final    BOTTLES DRAWN AEROBIC AND ANAEROBIC Blood Culture adequate volume   Culture   Final    NO GROWTH 5 DAYS Performed at [facility], [address], [place], [place] [address]    Report Status 12/31/2021 FINAL  Final  Blood culture (routine x 2)     Status: Abnormal   Collection Time: 12/26/21 10:46 PM   Specimen: BLOOD  Result Value  Ref Range Status   Specimen Description   Final    BLOOD RIGHT ANTECUBITAL Performed at [facility], [place] [address], [place], [place] [address]    Special Requests   Final    BOTTLES DRAWN AEROBIC AND ANAEROBIC Blood Culture adequate volume Performed at [facility], [address] [place], [place] [address]    Culture  Setup Time   Final    GRAM NEGATIVE RODS ANAEROBIC BOTTLE ONLY Organism ID to follow CRITICAL RESULT CALLED TO, READ BACK BY AND VERIFIED WITH: [facility] MITCHELL VFIEPP [id number] 12/27/21 HNM    Culture (A)  Final    ESCHERICHIA COLI Confirmed Extended Spectrum Beta-Lactamase Producer (ESBL).  In bloodstream infections from ESBL organisms, carbapenems are preferred over piperacillin/tazobactam. They are shown to have a lower risk of mortality.    Report Status 12/29/2021 FINAL  Final   Organism ID, Bacteria ESCHERICHIA COLI  Final      Susceptibility   Escherichia coli - MIC*    AMPICILLIN >=32 RESISTANT Resistant     CEFAZOLIN >=64 RESISTANT Resistant     CEFEPIME 16 RESISTANT Resistant     CEFTAZIDIME RESISTANT Resistant     CEFTRIAXONE >=64 RESISTANT Resistant     CIPROFLOXACIN <=0.25 SENSITIVE Sensitive     GENTAMICIN <=1 SENSITIVE Sensitive     IMIPENEM <=0.25 SENSITIVE Sensitive     TRIMETH/SULFA <=20 SENSITIVE Sensitive     AMPICILLIN/SULBACTAM 8 SENSITIVE Sensitive     PIP/TAZO <=4 SENSITIVE Sensitive     * ESCHERICHIA COLI  Blood Culture ID  Panel (Reflexed)     Status: Abnormal   Collection Time: 12/26/21 10:46 PM  Result Value Ref Range Status   Enterococcus faecalis NOT DETECTED NOT DETECTED Final   Enterococcus Faecium NOT DETECTED NOT DETECTED Final   Listeria monocytogenes NOT DETECTED NOT DETECTED Final   Staphylococcus species NOT DETECTED NOT DETECTED Final   Staphylococcus aureus (BCID) NOT DETECTED NOT DETECTED Final   Staphylococcus epidermidis NOT DETECTED NOT DETECTED Final   Staphylococcus lugdunensis NOT DETECTED NOT DETECTED Final   Streptococcus species NOT DETECTED NOT DETECTED Final   Streptococcus agalactiae NOT DETECTED NOT DETECTED Final   Streptococcus pneumoniae NOT DETECTED NOT DETECTED Final   Streptococcus pyogenes NOT DETECTED NOT DETECTED Final   A.calcoaceticus-baumannii NOT DETECTED NOT DETECTED Final   Bacteroides fragilis NOT DETECTED NOT DETECTED Final   Enterobacterales DETECTED (A) NOT DETECTED Final    Comment: Enterobacterales represent a large order of gram negative bacteria, not a single organism. CRITICAL RESULT CALLED TO, READ BACK BY AND VERIFIED WITH: DEVAN MITCHELL PHARMD 1126 12/27/21 HNM    Enterobacter cloacae complex NOT DETECTED NOT DETECTED Final   Escherichia coli DETECTED (A) NOT DETECTED Final    Comment: CRITICAL RESULT CALLED TO, READ BACK BY AND VERIFIED WITH: DEVAN MITCHELL OACZYS 0630 12/27/21 HNM    Klebsiella aerogenes NOT DETECTED NOT DETECTED Final   Klebsiella oxytoca NOT DETECTED NOT DETECTED Final   Klebsiella pneumoniae NOT DETECTED NOT DETECTED Final   Proteus species NOT DETECTED NOT DETECTED Final   Salmonella species NOT DETECTED NOT DETECTED Final   Serratia marcescens NOT DETECTED NOT DETECTED Final   Haemophilus influenzae NOT DETECTED NOT DETECTED Final   Neisseria meningitidis NOT DETECTED NOT DETECTED Final   Pseudomonas aeruginosa NOT DETECTED NOT DETECTED Final   Stenotrophomonas maltophilia NOT DETECTED NOT DETECTED Final   Candida albicans  NOT DETECTED NOT DETECTED Final   Candida auris NOT DETECTED NOT DETECTED Final   Candida  glabrata NOT DETECTED NOT DETECTED Final   Candida krusei NOT DETECTED NOT DETECTED Final   Candida parapsilosis NOT DETECTED NOT DETECTED Final   Candida tropicalis NOT DETECTED NOT DETECTED Final   Cryptococcus neoformans/gattii NOT DETECTED NOT DETECTED Final   CTX-M ESBL DETECTED (A) NOT DETECTED Final    Comment: CRITICAL RESULT CALLED TO, READ BACK BY AND VERIFIED WITH: DEVAN MITCHELL PHARMD 1126 12/27/21 HNM (NOTE) Extended spectrum beta-lactamase detected. Recommend a carbapenem as initial therapy.      Carbapenem resistance IMP NOT DETECTED NOT DETECTED Final   Carbapenem resistance KPC NOT DETECTED NOT DETECTED Final   Carbapenem resistance NDM NOT DETECTED NOT DETECTED Final   Carbapenem resist OXA 48 LIKE NOT DETECTED NOT DETECTED Final   Carbapenem resistance VIM NOT DETECTED NOT DETECTED Final    Comment: Performed at [facility], [address], [place], [place] [address]  Urine Culture     Status: Abnormal   Collection Time: 12/27/21  9:59 AM   Specimen: Urine, Clean Catch  Result Value Ref Range Status   Specimen Description   Final    URINE, CLEAN CATCH Performed at [facility], [address] [place], [place] [address]    Special Requests   Final    NONE Performed at [facility], [address], [place], [place] [address]    Culture (A)  Final    <10,000 COLONIES/mL INSIGNIFICANT GROWTH Performed at [facility] [address], [place], [place] [address]    Report Status 12/28/2021 FINAL  Final  MRSA Next Gen by PCR, Nasal     Status: Abnormal   Collection Time: 12/28/21  3:10 PM   Specimen: Nasal Mucosa; Nasal Swab  Result Value Ref Range Status   MRSA by PCR Next Gen DETECTED (A) NOT DETECTED Final    Comment: RESULT CALLED TO, READ BACK BY AND VERIFIED WITH: GINA SHANNON 1701 12/28/21 MU (NOTE) The GeneXpert MRSA Assay  (FDA approved for NASAL specimens only), is one component of a comprehensive MRSA colonization surveillance program. It is not intended to diagnose MRSA infection nor to guide or monitor treatment for MRSA infections. Test performance is not FDA approved in patients less than 60 years old. Performed at [facility], [address] [place], [place] [address]   CULTURE, BLOOD (ROUTINE X 2) w Reflex to ID Panel     Status: None (Preliminary result)   Collection Time: 12/29/21  6:21 AM   Specimen: BLOOD  Result Value Ref Range Status   Specimen Description BLOOD RIGHT [facility]  Final   Special Requests   Final    BOTTLES DRAWN AEROBIC AND ANAEROBIC Blood Culture adequate volume   Culture   Final    NO GROWTH 3 DAYS Performed at [facility], [address] [place], [place] [address]    Report Status PENDING  Incomplete  CULTURE, BLOOD (ROUTINE X 2) w Reflex to ID Panel     Status: Abnormal (Preliminary result)   Collection Time: 12/29/21  6:21 AM   Specimen: BLOOD  Result Value Ref Range Status   Specimen Description   Final    BLOOD RIGHT HAND Performed at [facility], [address], [place], [place] [address]    Special Requests   Final    IN PEDIATRIC BOTTLE Blood Culture adequate volume Performed at [facility], [address] [place], [place] [address]    Culture  Setup Time   Final    YEAST  IN PEDIATRIC BOTTLE Organism ID to follow CRITICAL RESULT CALLED TO, READ BACK BY AND VERIFIED WITH: BRANDON BEERS @1310  12/30/21 SCS    Culture (A)  Final    CANDIDA GLABRATA Sent to [place] for further susceptibility testing. Performed at [facility], [place] [address], [place], [place] [address]    Report Status PENDING  Incomplete  Blood Culture ID Panel (Reflexed)     Status: Abnormal   Collection Time: 12/29/21  6:21 AM  Result Value Ref Range Status   Enterococcus faecalis NOT DETECTED NOT DETECTED Final   Enterococcus Faecium NOT  DETECTED NOT DETECTED Final   Listeria monocytogenes NOT DETECTED NOT DETECTED Final   Staphylococcus species NOT DETECTED NOT DETECTED Final   Staphylococcus aureus (BCID) NOT DETECTED NOT DETECTED Final   Staphylococcus epidermidis NOT DETECTED NOT DETECTED Final   Staphylococcus lugdunensis NOT DETECTED NOT DETECTED Final   Streptococcus species NOT DETECTED NOT DETECTED Final   Streptococcus agalactiae NOT DETECTED NOT DETECTED Final   Streptococcus pneumoniae NOT DETECTED NOT DETECTED Final   Streptococcus pyogenes NOT DETECTED NOT DETECTED Final   A.calcoaceticus-baumannii NOT DETECTED NOT DETECTED Final   Bacteroides fragilis NOT DETECTED NOT DETECTED Final   Enterobacterales NOT DETECTED NOT DETECTED Final   Enterobacter cloacae complex NOT DETECTED NOT DETECTED Final   Escherichia coli NOT DETECTED NOT DETECTED Final   Klebsiella aerogenes NOT DETECTED NOT DETECTED Final   Klebsiella oxytoca NOT DETECTED NOT DETECTED Final   Klebsiella pneumoniae NOT DETECTED NOT DETECTED Final   Proteus species NOT DETECTED NOT DETECTED Final   Salmonella species NOT DETECTED NOT DETECTED Final   Serratia marcescens NOT DETECTED NOT DETECTED Final   Haemophilus influenzae NOT DETECTED NOT DETECTED Final   Neisseria meningitidis NOT DETECTED NOT DETECTED Final   Pseudomonas aeruginosa NOT DETECTED NOT DETECTED Final   Stenotrophomonas maltophilia NOT DETECTED NOT DETECTED Final   Candida albicans NOT DETECTED NOT DETECTED Final   Candida auris NOT DETECTED NOT DETECTED Final   Candida glabrata DETECTED (A) NOT DETECTED Final    Comment: CRITICAL RESULT CALLED TO, READ BACK BY AND VERIFIED WITH: BRANDON BEERS @1310  12/30/21 SCS    Candida krusei NOT DETECTED NOT DETECTED Final   Candida parapsilosis NOT DETECTED NOT DETECTED Final   Candida tropicalis NOT DETECTED NOT DETECTED Final   Cryptococcus neoformans/gattii NOT DETECTED NOT DETECTED Final    Comment: Performed at [facility], [address] [place], [place] [address]  CULTURE, BLOOD (ROUTINE X 2) w Reflex to ID Panel     Status: None (Preliminary result)   Collection Time: 12/31/21  3:08 AM   Specimen: BLOOD  Result Value Ref Range Status   Specimen Description BLOOD BLOOD RIGHT HAND  Final   Special Requests   Final    BOTTLES DRAWN AEROBIC AND ANAEROBIC Blood Culture adequate volume   Culture   Final    NO GROWTH 1 DAY Performed at [facility], [address] [place], [place] [address]    Report Status PENDING  Incomplete  CULTURE, BLOOD (ROUTINE X 2) w Reflex to ID Panel     Status: None (Preliminary result)   Collection Time: 12/31/21  3:08 AM   Specimen: BLOOD  Result Value Ref Range Status   Specimen Description BLOOD BLOOD LEFT HAND  Final   Special Requests   Final    BOTTLES DRAWN AEROBIC AND ANAEROBIC Blood Culture adequate volume   Culture   Final    NO GROWTH 1  DAY Performed at [facility], [address] [place], [place] [address]    Report Status PENDING  Incomplete    Coagulation Studies: No results for input(s): LABPROT, INR in the last 72 hours.  Urinalysis: No results for input(s): COLORURINE, LABSPEC, PHURINE, GLUCOSEU, HGBUR, BILIRUBINUR, KETONESUR, PROTEINUR, UROBILINOGEN, NITRITE, LEUKOCYTESUR in the last 72 hours.  Invalid input(s): APPERANCEUR     Imaging: ECHOCARDIOGRAM COMPLETE  Result Date: 01/01/2022    ECHOCARDIOGRAM REPORT   Patient Name:   [facility] Heeter Date of Exam: 01/01/2022 Medical Rec #:  [id number]  Height:       69.0 in Accession #:    [id number] Weight:       201.6 lb Date of Birth:  01-Mar-1956 BSA:          2.073 m Patient Age:    66 years   BP:           108/77 mmHg Patient Gender: M          HR:           90 bpm. Exam Location:  [facility] Procedure: 2D Echo, Color Doppler and Cardiac Doppler Indications:     Bacteremia R78.81  History:         Patient has no prior history of Echocardiogram examinations.                  COPD; Risk  Factors:Hypertension.  Sonographer:     Sherrie Sport Referring Phys:  [id number] Sidney Ace Diagnosing Phys: Isaias Cowman MD  Sonographer Comments: Suboptimal apical window. IMPRESSIONS  1. Left ventricular ejection fraction, by estimation, is 60 to 65%. The left ventricle has normal function. The left ventricle has no regional wall motion abnormalities. Left ventricular diastolic parameters are indeterminate.  2. Right ventricular systolic function is normal. The right ventricular size is normal.  3. The mitral valve is normal in structure. Mild to moderate mitral valve regurgitation. No evidence of mitral stenosis.  4. The aortic valve is normal in structure. Aortic valve regurgitation is not visualized. No aortic stenosis is present.  5. The inferior vena cava is normal in size with greater than 50% respiratory variability, suggesting right atrial pressure of 3 mmHg. Conclusion(s)/Recommendation(s): No evidence of valvular vegetations on this transthoracic echocardiogram. Consider a transesophageal echocardiogram to exclude infective endocarditis if clinically indicated. FINDINGS  Left Ventricle: Left ventricular ejection fraction, by estimation, is 60 to 65%. The left ventricle has normal function. The left ventricle has no regional wall motion abnormalities. The left ventricular internal cavity size was normal in size. There is  no left ventricular hypertrophy. Left ventricular diastolic parameters are indeterminate. Right Ventricle: The right ventricular size is normal. No increase in right ventricular wall thickness. Right ventricular systolic function is normal. Left Atrium: Left atrial size was normal in size. Right Atrium: Right atrial size was normal in size. Pericardium: There is no evidence of pericardial effusion. Mitral Valve: The mitral valve is normal in structure. Mild to moderate mitral valve regurgitation. No evidence of mitral valve stenosis. MV peak gradient, 6.0 mmHg. The mean mitral  valve gradient is 2.0 mmHg. Tricuspid Valve: The tricuspid valve is normal in structure. Tricuspid valve regurgitation is mild . No evidence of tricuspid stenosis. Aortic Valve: The aortic valve is normal in structure. Aortic valve regurgitation is not visualized. No aortic stenosis is present. Aortic valve mean gradient measures 2.5 mmHg. Aortic valve peak gradient measures 4.1 mmHg. Aortic valve area, by VTI measures 2.60 cm. Pulmonic Valve: The  pulmonic valve was normal in structure. Pulmonic valve regurgitation is not visualized. No evidence of pulmonic stenosis. Aorta: The aortic root is normal in size and structure. Venous: The inferior vena cava is normal in size with greater than 50% respiratory variability, suggesting right atrial pressure of 3 mmHg. IAS/Shunts: No atrial level shunt detected by color flow Doppler.  LEFT VENTRICLE PLAX 2D LVIDd:         4.80 cm LVIDs:         3.30 cm LV PW:         1.20 cm LV IVS:        1.70 cm LVOT diam:     2.00 cm LV SV:         46 LV SV Index:   22 LVOT Area:     3.14 cm  RIGHT VENTRICLE RV S prime:     11.70 cm/s TAPSE (M-mode): 1.6 cm LEFT ATRIUM            Index        RIGHT ATRIUM           Index LA diam:      4.70 cm  2.27 cm/m   RA Area:     13.10 cm LA Vol (A2C): 61.3 ml  29.57 ml/m  RA Volume:   29.30 ml  14.13 ml/m LA Vol (A4C): 105.0 ml 50.65 ml/m  AORTIC VALVE                    PULMONIC VALVE AV Area (Vmax):    2.94 cm     PV Vmax:        0.65 m/s AV Area (Vmean):   2.87 cm     PV Vmean:       48.450 cm/s AV Area (VTI):     2.60 cm     PV VTI:         0.130 m AV Vmax:           101.00 cm/s  PV Peak grad:   1.7 mmHg AV Vmean:          73.150 cm/s  PV Mean grad:   1.0 mmHg AV VTI:            0.179 m      RVOT Peak grad: 3 mmHg AV Peak Grad:      4.1 mmHg AV Mean Grad:      2.5 mmHg LVOT Vmax:         94.60 cm/s LVOT Vmean:        66.800 cm/s LVOT VTI:          0.148 m LVOT/AV VTI ratio: 0.83  AORTA Ao Root diam: 3.33 cm MITRAL VALVE                 TRICUSPID VALVE MV Area (PHT): 6.02 cm     TR Peak grad:   6.4 mmHg MV Area VTI:   1.49 cm     TR Vmax:        126.00 cm/s MV Peak grad:  6.0 mmHg MV Mean grad:  2.0 mmHg     SHUNTS MV Vmax:       1.22 m/s     Systemic VTI:  0.15 m MV Vmean:      59.4 cm/s    Systemic Diam: 2.00 cm MV Decel Time: 126 msec     Pulmonic VTI:  0.159 m MV E velocity: 131.00 cm/s Isaias Cowman MD Electronically  signed by Isaias Cowman MD Signature Date/Time: 01/01/2022/1:13:17 PM    Final      Medications:    sodium chloride Stopped (12/30/21 1542)   sodium chloride     anidulafungin 78 mL/hr at 01/01/22 0249   meropenem (MERREM) IV 200 mL/hr at 01/01/22 4332    sodium chloride   Intravenous Once   amiodarone  200 mg Oral BID   aspirin EC  81 mg Oral Daily   atorvastatin  80 mg Oral Daily   Chlorhexidine Gluconate Cloth  6 each Topical Daily   heparin  5,000 Units Subcutaneous Q12H   insulin aspart  0-15 Units Subcutaneous TID WC   insulin aspart  0-5 Units Subcutaneous QHS   insulin detemir  10 Units Subcutaneous Daily   levalbuterol  2 puff Inhalation Q8H   melatonin  5 mg Oral QHS   midodrine  10 mg Oral TID WC   multivitamin with minerals  1 tablet Oral Daily   mupirocin ointment  1 application Nasal BID   pantoprazole  40 mg Oral BID   pneumococcal 23 valent vaccine  0.5 mL Intramuscular Tomorrow-1000   sertraline  100 mg Oral BID   sodium chloride flush  3 mL Intravenous Q12H   sodium chloride flush  3 mL Intravenous Q12H   tamsulosin  0.4 mg Oral Daily   tiotropium  1 capsule Inhalation Daily   traZODone  50 mg Oral QHS   sodium chloride, acetaminophen **OR** acetaminophen, menthol-cetylpyridinium, ondansetron **OR** ondansetron (ZOFRAN) IV, oxybutynin, oxyCODONE, sodium chloride flush  Assessment/ Plan:  Mr. Wayne Burns is a 66 y.o.  male with past medical conditions including COPD and hypertension , who was admitted to [facility] on 12/26/2021 for Atrial fibrillation with rapid  ventricular response ([place]) [I48.91] Acute cystitis without hematuria [N30.00] AKI (acute kidney injury) ([place]) [N17.9] Septic shock ([place]) [A41.9, R65.21] Sepsis ([place]) [A41.9] Sepsis, due to unspecified organism, unspecified whether acute organ dysfunction present ([place]) [A41.9]   Acute kidney injury with chronic kidney disease stage IV.  Baseline appears to be 2.8 with GFR 25 on 12/22/2021.  AKI may be due to possible obstruction from migrating stent recently placed at [facility].    Renal function has stabilized over the past 1 to 2 days.  Adequate urine output recorded.  We will continue to monitor renal function.  Plan for surgery tomorrow to revise stent placement.  Lab Results  Component Value Date   CREATININE 2.91 (H) 12/31/2021   CREATININE 2.88 (H) 12/31/2021   CREATININE 3.18 (H) 12/30/2021    Intake/Output Summary (Last 24 hours) at 01/01/2022 1347 Last data filed at 01/01/2022 0700 Gross per 24 hour  Intake 1001.37 ml  Output 3160 ml  Net -2158.63 ml    2.  Hypotension.  Currently on amiodarone drip with midodrine schedule III times daily.  Home regimen of amlodipine currently held  Blood pressure stable at this time  3.  Acute metabolic acidosis.  Serum bicarbonate also fluctuating.  Resolved with sodium bicarb infusion   LOS: 5 Wayne Burns 2/27/20231:47 PM

## 2022-01-01 NOTE — Progress Notes (Signed)
Pharmacy Antibiotic Note  Wayne Burns is a 66 y.o. male admitted on 12/26/2021 with gram negative bacteremia and candidemia.  Pharmacy has been consulted for meropenem dosing.   Patient presented with a UTI. Per family, patient has history of urinary retention with self-cath at home and reportedly too weak to self-cath. Recently discharged from [facility] with bilateral ureteral stents placed. Renal function improving to baseline (Scr 2.91, est CrCl 29 mL/min).  Plan for TEE 2/27 and ureteral stent revision 2/28; will repeat blood culture 2/28 per ID  Plan: Meropenem 1gm q12h (day 3) Eraxis 100 mg q24h (day 2) Follow up with ID for plan and duration of therapy Monitor renal function   Height: 5\' 9"  (175.3 cm) Weight: 91.4 kg (201 lb 9.6 oz) IBW/kg (Calculated) : 70.7  Temp (24hrs), Avg:98.1 F (36.7 C), Min:97.7 F (36.5 C), Max:98.9 F (37.2 C)  Recent Labs  Lab 12/28/21 0500 12/28/21 1100 12/28/21 1101 12/28/21 1613 12/28/21 2157 12/29/21 0621 12/29/21 0716 12/30/21 0624 12/31/21 0308 12/31/21 0631 01/01/22 0529  WBC 18.5*  --   --   --   --  19.0*  --  6.9 6.5 7.9 6.4  CREATININE 3.86*  --    < >  --   --  2.84* 3.34* 3.18* 2.88* 2.91*  --   LATICACIDVEN 2.0* 2.1*  --  2.2* 2.1* 1.8  --   --   --   --   --    < > = values in this interval not displayed.     Estimated Creatinine Clearance: 29 mL/min (A) (by C-G formula based on SCr of 2.91 mg/dL (H)).    No Known Allergies  Antimicrobials this admission: Cefepime 2/21 x1 Meropenem 2/22 >> Eraxis 2/26 >>  Dose adjustments this admission:   Microbiology results: 2/21 BCx: GNR, BCID ESBL E Coli 2/22 UCx: Insignificant growth 2/23 MRCA PCR: (+) 2/24 Bcx: Candida glabrata   Thank you for allowing pharmacy to be a part of this patients care.   Owens Loffler, PharmD Candidate 01/01/2022 11:59 AM

## 2022-01-01 NOTE — Progress Notes (Addendum)
Nutrition Follow-up  DOCUMENTATION CODES:   Obesity unspecified  INTERVENTION:   -D/c Prosource Plus -D/c Nepro -Double protein portions with meals -Continue MVI with minerals daily -Provided "Low Sodium Nutrition Therapy" handout from [facility] Nutrition Care Manual; attached to AVS/ discharge summary  NUTRITION DIAGNOSIS:   Increased nutrient needs related to acute illness (+COVID-19) as evidenced by estimated needs.  Ongoing  GOAL:   Patient will meet greater than or equal to 90% of their needs  Progressing   MONITOR:   PO intake, Supplement acceptance, Labs, Weight trends, Skin, I & O's  REASON FOR ASSESSMENT:   Malnutrition Screening Tool    ASSESSMENT:   Wayne Burns is a 66 y.o. male seen in the emergency room today for urinary tract infection, sepsis.  Patient is unable to give a clear HPI he is tachycardic and review of systems as well is difficult to obtain.  Initially brought by EMS today patient was IVC by family members for not being able to take care of himself and living in poor conditions refusing to go to the hospital and was deemed a danger to himself.  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  Patient on my evaluation is alert oriented but tachycardic perseverates with answering questions and is not able to clearly give me history.  I would even say patient is mildly disoriented.  When asked about his heart history he initially says no then he says he had blockages.  When asked how he knows about the blockages he said he had been tested and I said did you ever have a cardiac cath he said yes.  I am unable to verify this information from [facility] epic system due to lack of access.  Patient appears disheveled unkempt head soiled himself with stool and urine on initial evaluation.  The emergency room patient meets sepsis criteria.  Reviewed I/O's: -2 L x 24 hours and +4.5 L since  admission  UOP: 4 L x 24 hours  Spoke with pt and RN at bedside. RN reports pt is making good progress and pt shares that he is feeling better today. Pt consuming breakfast of cereal and jello. RD provided sweetener and extra milk for pt. Pt shares decreased appetite when he was at [facility] with kidney stones secondary to pain, but this has since resolved.  Pt shares that he has lost from 230#-203# in the past few months intentionally. He shares that he was "eating too much like a pig" and has been trying to cut back on his portions ("I was eating whole footlong tuna subs from [employer] and that's just not good"). Pt expresses desire to eat more healthfully and requested RD provide written information into his discharge instructions.   Pt refusing both Prosource and Nepro supplements. Discussed importance of good meal intake to promote healing. Pt eager to go home.   Medications reviewed and include spiriva.   Labs reviewed: CBGS: 99-131 (inpatient orders for glycemic control are 0-15 units insulin aspart TID with meals, 0-5 units insulin aspart daily at bedtime, and 10 units insulin detemir daily).    NUTRITION - FOCUSED PHYSICAL EXAM:  Flowsheet Row Most Recent Value  Orbital Region No depletion  Upper Arm Region No depletion  Thoracic and Lumbar Region No depletion  Buccal Region No depletion  Temple Region No depletion  Clavicle Bone Region No depletion  Clavicle and Acromion Bone Region No depletion  Scapular Bone  Region No depletion  Dorsal Hand No depletion  Patellar Region No depletion  Anterior Thigh Region No depletion  Posterior Calf Region No depletion  Edema (RD Assessment) Moderate  Hair Reviewed  Eyes Reviewed  Mouth Reviewed  Skin Reviewed  Nails Reviewed       Diet Order:   Diet Order             Diet 2 gram sodium Room service appropriate? Yes; Fluid consistency: Thin  Diet effective now                   EDUCATION NEEDS:   No education needs have been  identified at this time  Skin:  Skin Assessment: Reviewed RN Assessment  Last BM:  12/30/21  Height:   Ht Readings from Last 1 Encounters:  12/31/21 5\' 9"  (1.753 m)    Weight:   Wt Readings from Last 1 Encounters:  12/31/21 91.4 kg    Ideal Body Weight:  72.7 kg  BMI:  Body mass index is 29.77 kg/m.  Estimated Nutritional Needs:   Kcal:  2200-2400  Protein:  110-125 grams  Fluid:  > 2 L    Loistine Chance, RD, LDN, [place] Registered Dietitian II Certified Diabetes Care and Education Specialist Please refer to [facility] for RD and/or RD on-call/weekend/after hours pager

## 2022-01-01 NOTE — Progress Notes (Signed)
PROGRESS NOTE    Wayne Burns  NID:[id number] DOB: 16-Dec-1955 DOA: 12/26/2021 PCP: Jerene Pitch, MD    Brief Narrative:  66 year old male admitted with septic shock due to severe urosepsis and ESBL E.Coli BACTEREMIA from bilateral obstructing distal ureteral calculi, along with new onset atrial fibrillation with RVR, acute kidney injury, and metabolic acidosis.  Cardiology, urology, nephrology following.  66 year old male with a past medical history significant for COPD, hypertension, chronic urinary retention with bilateral hydronephrosis dating back to 2020 who was brought to [facility] ED on 12/26/2021 due to family concerns that he was not being able to take care of himself with poor living conditions, refusing to go to hospital, and felt to be a danger to himself.  He was IVC by his family members.   Of note he was recently hospitalized at [facility] for 10 days (12/12/21-12/22/21) for treatment of AKI secondary to bilateral obstructing distal ureteral calculi.  Hospital course was complicated by type II coronary artery disease, sepsis, renal failure, respiratory failure, and melena.  Underwent EGD on 2/16 with evidence of prior gastric ulcers but no acute bleeding.  2/26: Patient has been weaned off vasopressors.  Atrial fibrillation rate controlled on p.o. amiodarone.  Interestingly blood cultures positive for Candida glabrata.  Telemetry infectious disease consult obtained.  Added Eraxis to medication regimen.  Recommending TEE for which cardiology is aware.  2/27: Patient had TTE performed which did not demonstrate any evidence of vegetation.  TEE deferred for now.  Patient has been afebrile and overall stable and comfortable.   Assessment & Plan:   Principal Problem:   Depression Active Problems:   Abnormal LFTs (liver function tests)   Essential hypertension   Sepsis (HCC)   Urinary tract infection   Involuntary commitment   AKI (acute kidney injury) (HCC)   Elevated troponin   Elevated  glucose   Atrial fibrillation with RVR (HCC)   Ureteral stent displacement, initial encounter ([place])   COVID-19 virus infection   COPD with acute exacerbation ([place])   Septic shock ([place])  Septic shock ESBL urosepsis Candida glabrata fungemia Patient initially required with the pressor support Now weaned off Likely source of ESBL infection is urine Patient on meropenem On 2/25 blood cultures positive for Candida glabrata, unclear significance or source TTE completed 2/27, no vegetation Plan: Continue meropenem Continue Eraxis We will repeat blood cultures tomorrow 2/28 Monitor vitals and fever curve Continue midodrine for blood pressure support, decrease dose to 5 mg 3 times daily  Bilateral obstructive ureteral calculi Right ureteral stent displacement Patient had stents placed at [facility] Noted to have proximal migration of right ureteral stent Has Foley in place placed by urology Plan: Urology following Tentative plan for stent revision Tuesday, February 28 Continue Foley catheter  New onset atrial fibrillation Initially required IV amiodarone, now weaned off Plan: Continue p.o. amiodarone 200 mg twice daily Defer anticoagulation for now considering acute anemia CHA2DS2-VASc is 1, continue aspirin 81 mg daily Telemetry monitoring  Acute kidney injury on chronic kidney disease stage IV Metabolic acidosis, improved Renal function has been fluctuating Urine output adequate AKI may be due to obstruction from migrating stent Acidosis improved after bicarbonate infusion Plan: Daily kidney function Nephrology follow-up  Acute on chronic anemia of chronic kidney disease No clear source of blood loss Patient's hemoglobin ranges from high sixes to low sevens Suspect reactive anemia secondary to multiple acute issues Per previous documentation patient had a recent colonoscopy at [facility] without any bleeding identified Plan: Trend daily CBC Daily  aspirin as above Okay for DVT  prophylaxis  Combined systolic and diastolic congestive heart failure No evidence of exacerbation  History of COVID-19 infection Essentially asymptomatic Out of treatment or isolation window  Acute metabolic encephalopathy Appears resolved Treat metabolic derangements and infection Avoid sedating medications Encourage normal sleep/wake cycle Psych consulted during this admission, does not require IVC or inpatient psychiatric hospitalization  Thrombocytopenia Improving No evidence of acute blood loss Okay for VTE prophylaxis Twice daily PPI   DVT prophylaxis: SQ heparin Code Status: Full Family Communication: None today Disposition Plan: Status is: Inpatient Remains inpatient appropriate because: Multiple acute issues, sepsis, resolving shock, atrial fibrillation, needs urologic procedure.\Stent revision planned for 2/28  Level of care: Progressive  Consultants:  Nephrology Cardiology Urology  Procedures:  None  Antimicrobials: Meropenem Eraxis   Subjective: Patient seen and examined.  Moved to progressive floor.  Hemodynamically stable.  No distress.  No pain complaints  Objective: Vitals:   01/01/22 0450 01/01/22 1013 01/01/22 1129 01/01/22 1357  BP: 108/77 (!) 111/57 103/64 120/64  Pulse: 90 (!) 103 94 (!) 109  Resp: 18  18 18   Temp: 97.8 F (36.6 C) 98 F (36.7 C) 98.9 F (37.2 C) 98.4 F (36.9 C)  TempSrc: Oral Oral Oral Oral  SpO2: 100% 100% 99% 100%  Weight:      Height:        Intake/Output Summary (Last 24 hours) at 01/01/2022 1404 Last data filed at 01/01/2022 0700 [place] per 24 hour  Intake 951.22 ml  Output 3160 ml  Net -2208.78 ml   Filed Weights   12/26/21 2013 12/28/21 1500 12/31/21 1741  Weight: 72.6 kg 92.6 kg 91.4 kg    Examination:  General exam: No acute distress.  Mentating clearly Respiratory system: Lungs clear.  Normal work of breathing.  Room air Cardiovascular system: S1-S2, regular rate, irregular rhythm, no  murmurs, no pedal edema Gastrointestinal system: Soft, NT/ND, normal bowel sounds Central nervous system: Alert and oriented. No focal neurological deficits. Extremities: Symmetric 5 x 5 power. Skin: No rashes, lesions or ulcers Psychiatry: Judgement and insight appear normal. Mood & affect appropriate.     Data Reviewed: I have personally reviewed following labs and imaging studies  CBC: Recent Labs  Lab 12/28/21 0500 12/29/21 0621 12/29/21 0718 12/30/21 0624 12/30/21 0756 12/31/21 0308 12/31/21 0631 01/01/22 0529  WBC 18.5* 19.0*  --  6.9  --  6.5 7.9 6.4  NEUTROABS 16.3* 17.5*  --  5.8  --  5.2  --  4.9  HGB 8.1* 6.7*   < > 6.8* 7.6* 6.2* 7.0* 6.7*  HCT 25.4* 19.9*   < > 20.5* 22.6* 19.1* 21.9* 21.2*  MCV 87.3 84.7  --  84.4  --  86.0 86.2 88.0  PLT 226 181  --  127*  --  114* 128* 128*   < > = values in this interval not displayed.   Basic Metabolic Panel: Recent Labs  Lab 12/28/21 0500 12/28/21 1101 12/29/21 0621 12/29/21 0716 12/30/21 0624 12/31/21 0308 12/31/21 0631  NA 130*   < > 128* 127* 131* 130* 132*  K 4.2   < > 2.8* 3.7 3.6 3.4* 3.6  CL 105   < > 89* 97* 96* 93* 94*  CO2 14*   < > 29 18* 23 27 28   GLUCOSE 260*   < > 684* 325* 112* 118* 98  BUN 67*   < > 51* 57* 59* 53* 54*  CREATININE 3.86*   < > 2.84* 3.34*  3.18* 2.88* 2.91*  CALCIUM 7.7*   < > 6.1* 7.4* 7.7* 7.5* 7.7*  MG 1.7  --  1.6*  --  2.0 1.9  --   PHOS 4.5  --  3.6  --  3.7 3.9  --    < > = values in this interval not displayed.   GFR: Estimated Creatinine Clearance: 29 mL/min (A) (by C-G formula based on SCr of 2.91 mg/dL (H)). Liver Function Tests: Recent Labs  Lab 12/26/21 2020 12/27/21 0532 12/29/21 0621 12/29/21 0716 12/30/21 0624 12/31/21 0308  AST 31 28  --  18  --   --   ALT 55* 44  --  35  --   --   ALKPHOS 204* 167*  --  157*  --   --   BILITOT 2.1* 1.5*  --  0.7  --   --   PROT 8.1 6.8  --  7.0  --   --   ALBUMIN 2.7* 2.2* 1.8* 2.3* 2.1* 2.0*   No results for  input(s): LIPASE, AMYLASE in the last 168 hours. No results for input(s): AMMONIA in the last 168 hours. Coagulation Profile: No results for input(s): INR, PROTIME in the last 168 hours. Cardiac Enzymes: Recent Labs  Lab 12/26/21 2020  CKTOTAL 10*   BNP (last 3 results) No results for input(s): PROBNP in the last 8760 hours. HbA1C: No results for input(s): HGBA1C in the last 72 hours. CBG: Recent Labs  Lab 12/31/21 1152 12/31/21 1745 12/31/21 2033 01/01/22 0840 01/01/22 1247  GLUCAP 116* 131* 111* 99 154*   Lipid Profile: No results for input(s): CHOL, HDL, LDLCALC, TRIG, CHOLHDL, LDLDIRECT in the last 72 hours. Thyroid Function Tests: No results for input(s): TSH, T4TOTAL, FREET4, T3FREE, THYROIDAB in the last 72 hours. Anemia Panel: Recent Labs    12/31/21 1239  VITAMINB12 865  FOLATE 4.3*  FERRITIN 196  TIBC 270  IRON 16*   Sepsis Labs: Recent Labs  Lab 12/27/21 1108 12/27/21 1407 12/28/21 0500 12/28/21 1100 12/28/21 1613 12/28/21 2157 12/29/21 0621  PROCALCITON 1.78  --  1.43  --   --   --  0.67  LATICACIDVEN  --    < > 2.0* 2.1* 2.2* 2.1* 1.8   < > = values in this interval not displayed.    Recent Results (from the past 240 hour(s))  Resp Panel by RT-PCR (Flu A&B, Covid)     Status: Abnormal   Collection Time: 12/26/21 10:33 PM   Specimen: Nasopharyngeal(NP) swabs in vial transport medium  Result Value Ref Range Status   SARS Coronavirus 2 by RT PCR POSITIVE (A) NEGATIVE Final    Comment: (NOTE) SARS-CoV-2 target nucleic acids are DETECTED.  The SARS-CoV-2 RNA is generally detectable in upper respiratory specimens during the acute phase of infection. Positive results are indicative of the presence of the identified virus, but do not rule out bacterial infection or co-infection with other pathogens not detected by the test. Clinical correlation with patient history and other diagnostic information is necessary to determine patient infection  status. The expected result is Negative.  Fact Sheet for Patients: EntrepreneurPulse.com.au  Fact Sheet for Healthcare Providers: IncredibleEmployment.be  This test is not yet approved or cleared by the [place] FDA and  has been authorized for detection and/or diagnosis of SARS-CoV-2 by FDA under an Emergency Use Authorization (EUA).  This EUA will remain in effect (meaning this test can be used) for the duration of  the COVID-19 declaration under Section 564(b)(1) of  the A ct, 21 U.S.C. section 360bbb-3(b)(1), unless the authorization is terminated or revoked sooner.     Influenza A by PCR NEGATIVE NEGATIVE Final   Influenza B by PCR NEGATIVE NEGATIVE Final    Comment: (NOTE) The Xpert Xpress SARS-CoV-2/FLU/RSV plus assay is intended as an aid in the diagnosis of influenza from Nasopharyngeal swab specimens and should not be used as a sole basis for treatment. Nasal washings and aspirates are unacceptable for Xpert Xpress SARS-CoV-2/FLU/RSV testing.  Fact Sheet for Patients: EntrepreneurPulse.com.au  Fact Sheet for Healthcare Providers: IncredibleEmployment.be  This test is not yet approved or cleared by the [place] FDA and has been authorized for detection and/or diagnosis of SARS-CoV-2 by FDA under an Emergency Use Authorization (EUA). This EUA will remain in effect (meaning this test can be used) for the duration of the COVID-19 declaration under Section 564(b)(1) of the Act, 21 U.S.C. section 360bbb-3(b)(1), unless the authorization is terminated or revoked.  Performed at [facility], [address] [place], [place] [address]   Blood culture (routine x 2)     Status: None   Collection Time: 12/26/21 10:33 PM   Specimen: BLOOD  Result Value Ref Range Status   Specimen Description BLOOD LEFT ASSIST CONTROL  Final   Special Requests   Final    BOTTLES DRAWN AEROBIC AND  ANAEROBIC Blood Culture adequate volume   Culture   Final    NO GROWTH 5 DAYS Performed at [facility], [address], [place], [place] [address]    Report Status 12/31/2021 FINAL  Final  Blood culture (routine x 2)     Status: Abnormal   Collection Time: 12/26/21 10:46 PM   Specimen: BLOOD  Result Value Ref Range Status   Specimen Description   Final    BLOOD RIGHT ANTECUBITAL Performed at [facility], [place] [address], [place], [place] [address]    Special Requests   Final    BOTTLES DRAWN AEROBIC AND ANAEROBIC Blood Culture adequate volume Performed at [facility], [address] [place], [place] [address]    Culture  Setup Time   Final    GRAM NEGATIVE RODS ANAEROBIC BOTTLE ONLY Organism ID to follow CRITICAL RESULT CALLED TO, READ BACK BY AND VERIFIED WITH: [facility] MITCHELL [id number] 12/27/21 HNM    Culture (A)  Final    ESCHERICHIA COLI Confirmed Extended Spectrum Beta-Lactamase Producer (ESBL).  In bloodstream infections from ESBL organisms, carbapenems are preferred over piperacillin/tazobactam. They are shown to have a lower risk of mortality.    Report Status 12/29/2021 FINAL  Final   Organism ID, Bacteria ESCHERICHIA COLI  Final      Susceptibility   Escherichia coli - MIC*    AMPICILLIN >=32 RESISTANT Resistant     CEFAZOLIN >=64 RESISTANT Resistant     CEFEPIME 16 RESISTANT Resistant     CEFTAZIDIME RESISTANT Resistant     CEFTRIAXONE >=64 RESISTANT Resistant     CIPROFLOXACIN <=0.25 SENSITIVE Sensitive     GENTAMICIN <=1 SENSITIVE Sensitive     IMIPENEM <=0.25 SENSITIVE Sensitive     TRIMETH/SULFA <=20 SENSITIVE Sensitive     AMPICILLIN/SULBACTAM 8 SENSITIVE Sensitive     PIP/TAZO <=4 SENSITIVE Sensitive     * ESCHERICHIA COLI  Blood Culture ID Panel (Reflexed)     Status: Abnormal   Collection Time: 12/26/21 10:46 PM  Result Value Ref Range Status   Enterococcus faecalis NOT DETECTED NOT DETECTED Final   Enterococcus Faecium  NOT DETECTED  NOT DETECTED Final   Listeria monocytogenes NOT DETECTED NOT DETECTED Final   Staphylococcus species NOT DETECTED NOT DETECTED Final   Staphylococcus aureus (BCID) NOT DETECTED NOT DETECTED Final   Staphylococcus epidermidis NOT DETECTED NOT DETECTED Final   Staphylococcus lugdunensis NOT DETECTED NOT DETECTED Final   Streptococcus species NOT DETECTED NOT DETECTED Final   Streptococcus agalactiae NOT DETECTED NOT DETECTED Final   Streptococcus pneumoniae NOT DETECTED NOT DETECTED Final   Streptococcus pyogenes NOT DETECTED NOT DETECTED Final   A.calcoaceticus-baumannii NOT DETECTED NOT DETECTED Final   Bacteroides fragilis NOT DETECTED NOT DETECTED Final   Enterobacterales DETECTED (A) NOT DETECTED Final    Comment: Enterobacterales represent a large order of gram negative bacteria, not a single organism. CRITICAL RESULT CALLED TO, READ BACK BY AND VERIFIED WITH: DEVAN MITCHELL PHARMD 1126 12/27/21 HNM    Enterobacter cloacae complex NOT DETECTED NOT DETECTED Final   Escherichia coli DETECTED (A) NOT DETECTED Final    Comment: CRITICAL RESULT CALLED TO, READ BACK BY AND VERIFIED WITH: DEVAN MITCHELL [id number] 12/27/21 HNM    Klebsiella aerogenes NOT DETECTED NOT DETECTED Final   Klebsiella oxytoca NOT DETECTED NOT DETECTED Final   Klebsiella pneumoniae NOT DETECTED NOT DETECTED Final   Proteus species NOT DETECTED NOT DETECTED Final   Salmonella species NOT DETECTED NOT DETECTED Final   Serratia marcescens NOT DETECTED NOT DETECTED Final   Haemophilus influenzae NOT DETECTED NOT DETECTED Final   Neisseria meningitidis NOT DETECTED NOT DETECTED Final   Pseudomonas aeruginosa NOT DETECTED NOT DETECTED Final   Stenotrophomonas maltophilia NOT DETECTED NOT DETECTED Final   Candida albicans NOT DETECTED NOT DETECTED Final   Candida auris NOT DETECTED NOT DETECTED Final   Candida glabrata NOT DETECTED NOT DETECTED Final   Candida krusei NOT DETECTED NOT DETECTED Final    Candida parapsilosis NOT DETECTED NOT DETECTED Final   Candida tropicalis NOT DETECTED NOT DETECTED Final   Cryptococcus neoformans/gattii NOT DETECTED NOT DETECTED Final   CTX-M ESBL DETECTED (A) NOT DETECTED Final    Comment: CRITICAL RESULT CALLED TO, READ BACK BY AND VERIFIED WITH: DEVAN MITCHELL PHARMD 1126 12/27/21 HNM (NOTE) Extended spectrum beta-lactamase detected. Recommend a carbapenem as initial therapy.      Carbapenem resistance IMP NOT DETECTED NOT DETECTED Final   Carbapenem resistance KPC NOT DETECTED NOT DETECTED Final   Carbapenem resistance NDM NOT DETECTED NOT DETECTED Final   Carbapenem resist OXA 48 LIKE NOT DETECTED NOT DETECTED Final   Carbapenem resistance VIM NOT DETECTED NOT DETECTED Final    Comment: Performed at [facility], [address] [place], [place] [address]  Urine Culture     Status: Abnormal   Collection Time: 12/27/21  9:59 AM   Specimen: Urine, Clean Catch  Result Value Ref Range Status   Specimen Description   Final    URINE, CLEAN CATCH Performed at [facility], [address], [place], [place] [address]    Special Requests   Final    NONE Performed at [facility], [address] [place], [place] [address]    Culture (A)  Final    <10,000 COLONIES/mL INSIGNIFICANT GROWTH Performed at [place] [address] [place], [place] [address]    Report Status 12/28/2021 FINAL  Final  MRSA Next Gen by PCR, Nasal     Status: Abnormal   Collection Time: 12/28/21  3:10 PM   Specimen: Nasal Mucosa; Nasal Swab  Result Value Ref Range Status   MRSA  by PCR Next Gen DETECTED (A) NOT DETECTED Final    Comment: RESULT CALLED TO, READ BACK BY AND VERIFIED WITH: GINA SHANNON 1701 12/28/21 MU (NOTE) The GeneXpert MRSA Assay (FDA approved for NASAL specimens only), is one component of a comprehensive MRSA colonization surveillance program. It is not intended to diagnose MRSA infection nor to guide or  monitor treatment for MRSA infections. Test performance is not FDA approved in patients less than 24 years old. Performed at [facility], [address] [place], [place] [address]   CULTURE, BLOOD (ROUTINE X 2) w Reflex to ID Panel     Status: None (Preliminary result)   Collection Time: 12/29/21  6:21 AM   Specimen: BLOOD  Result Value Ref Range Status   Specimen Description BLOOD RIGHT [facility]  Final   Special Requests   Final    BOTTLES DRAWN AEROBIC AND ANAEROBIC Blood Culture adequate volume   Culture   Final    NO GROWTH 3 DAYS Performed at [facility], [address] [place], [place] [address]    Report Status PENDING  Incomplete  CULTURE, BLOOD (ROUTINE X 2) w Reflex to ID Panel     Status: Abnormal (Preliminary result)   Collection Time: 12/29/21  6:21 AM   Specimen: BLOOD  Result Value Ref Range Status   Specimen Description   Final    BLOOD RIGHT HAND Performed at [facility], [address] [place], [place] [address]    Special Requests   Final    IN PEDIATRIC BOTTLE Blood Culture adequate volume Performed at [facility], [address] [place], [place] [address]    Culture  Setup Time   Final    YEAST IN PEDIATRIC BOTTLE Organism ID to follow CRITICAL RESULT CALLED TO, READ BACK BY AND VERIFIED WITH: BRANDON BEERS @1310  12/30/21 SCS    Culture (A)  Final    CANDIDA GLABRATA Sent to [place] for further susceptibility testing. Performed at [facility], [place] [address] [place],  [address]    Report Status PENDING  Incomplete  Blood Culture ID Panel (Reflexed)     Status: Abnormal   Collection Time: 12/29/21  6:21 AM  Result Value Ref Range Status   Enterococcus faecalis NOT DETECTED NOT DETECTED Final   Enterococcus Faecium NOT DETECTED NOT DETECTED Final   Listeria monocytogenes NOT DETECTED NOT DETECTED Final   Staphylococcus species NOT DETECTED NOT DETECTED Final   Staphylococcus aureus (BCID) NOT  DETECTED NOT DETECTED Final   Staphylococcus epidermidis NOT DETECTED NOT DETECTED Final   Staphylococcus lugdunensis NOT DETECTED NOT DETECTED Final   Streptococcus species NOT DETECTED NOT DETECTED Final   Streptococcus agalactiae NOT DETECTED NOT DETECTED Final   Streptococcus pneumoniae NOT DETECTED NOT DETECTED Final   Streptococcus pyogenes NOT DETECTED NOT DETECTED Final   A.calcoaceticus-baumannii NOT DETECTED NOT DETECTED Final   Bacteroides fragilis NOT DETECTED NOT DETECTED Final   Enterobacterales NOT DETECTED NOT DETECTED Final   Enterobacter cloacae complex NOT DETECTED NOT DETECTED Final   Escherichia coli NOT DETECTED NOT DETECTED Final   Klebsiella aerogenes NOT DETECTED NOT DETECTED Final   Klebsiella oxytoca NOT DETECTED NOT DETECTED Final   Klebsiella pneumoniae NOT DETECTED NOT DETECTED Final   Proteus species NOT DETECTED NOT DETECTED Final   Salmonella species NOT DETECTED NOT DETECTED Final   Serratia marcescens NOT DETECTED NOT DETECTED Final   Haemophilus influenzae NOT DETECTED NOT DETECTED Final   Neisseria meningitidis NOT DETECTED NOT DETECTED  Final   Pseudomonas aeruginosa NOT DETECTED NOT DETECTED Final   Stenotrophomonas maltophilia NOT DETECTED NOT DETECTED Final   Candida albicans NOT DETECTED NOT DETECTED Final   Candida auris NOT DETECTED NOT DETECTED Final   Candida glabrata DETECTED (A) NOT DETECTED Final    Comment: CRITICAL RESULT CALLED TO, READ BACK BY AND VERIFIED WITH: BRANDON BEERS @1310  12/30/21 SCS    Candida krusei NOT DETECTED NOT DETECTED Final   Candida parapsilosis NOT DETECTED NOT DETECTED Final   Candida tropicalis NOT DETECTED NOT DETECTED Final   Cryptococcus neoformans/gattii NOT DETECTED NOT DETECTED Final    Comment: Performed at [facility], [address] [place], [place] [address]  CULTURE, BLOOD (ROUTINE X 2) w Reflex to ID Panel     Status: None (Preliminary result)   Collection Time: 12/31/21  3:08 AM    Specimen: BLOOD  Result Value Ref Range Status   Specimen Description BLOOD BLOOD RIGHT HAND  Final   Special Requests   Final    BOTTLES DRAWN AEROBIC AND ANAEROBIC Blood Culture adequate volume   Culture   Final    NO GROWTH 1 DAY Performed at [facility], [address] [place], [place] [address]    Report Status PENDING  Incomplete  CULTURE, BLOOD (ROUTINE X 2) w Reflex to ID Panel     Status: None (Preliminary result)   Collection Time: 12/31/21  3:08 AM   Specimen: BLOOD  Result Value Ref Range Status   Specimen Description BLOOD BLOOD LEFT HAND  Final   Special Requests   Final    BOTTLES DRAWN AEROBIC AND ANAEROBIC Blood Culture adequate volume   Culture   Final    NO GROWTH 1 DAY Performed at [facility], [address], [place], [place] [address]    Report Status PENDING  Incomplete         Radiology Studies: ECHOCARDIOGRAM COMPLETE  Result Date: 01/01/2022    ECHOCARDIOGRAM REPORT   Patient Name:   [facility] [place] Date of Exam: 01/01/2022 Medical Rec #:  [id number]  Height:       69.0 in Accession #:    [id number] Weight:       201.6 lb Date of Birth:  April 19, 1956 BSA:          2.073 m Patient Age:    52 years   BP:           108/77 mmHg Patient Gender: M          HR:           90 bpm. Exam Location:  [facility] Procedure: 2D Echo, Color Doppler and Cardiac Doppler Indications:     Bacteremia R78.81  History:         Patient has no prior history of Echocardiogram examinations.                  COPD; Risk Factors:Hypertension.  Sonographer:     Sherrie Sport Referring Phys:  [id number] Sidney Ace Diagnosing Phys: Isaias Cowman MD  Sonographer Comments: Suboptimal apical window. IMPRESSIONS  1. Left ventricular ejection fraction, by estimation, is 60 to 65%. The left ventricle has normal function. The left ventricle has no regional wall motion abnormalities. Left ventricular diastolic parameters are indeterminate.  2. Right ventricular systolic function is  normal. The right ventricular size is normal.  3. The mitral valve is normal in structure. Mild to moderate mitral valve regurgitation. No evidence of mitral stenosis.  4. The aortic  valve is normal in structure. Aortic valve regurgitation is not visualized. No aortic stenosis is present.  5. The inferior vena cava is normal in size with greater than 50% respiratory variability, suggesting right atrial pressure of 3 mmHg. Conclusion(s)/Recommendation(s): No evidence of valvular vegetations on this transthoracic echocardiogram. Consider a transesophageal echocardiogram to exclude infective endocarditis if clinically indicated. FINDINGS  Left Ventricle: Left ventricular ejection fraction, by estimation, is 60 to 65%. The left ventricle has normal function. The left ventricle has no regional wall motion abnormalities. The left ventricular internal cavity size was normal in size. There is  no left ventricular hypertrophy. Left ventricular diastolic parameters are indeterminate. Right Ventricle: The right ventricular size is normal. No increase in right ventricular wall thickness. Right ventricular systolic function is normal. Left Atrium: Left atrial size was normal in size. Right Atrium: Right atrial size was normal in size. Pericardium: There is no evidence of pericardial effusion. Mitral Valve: The mitral valve is normal in structure. Mild to moderate mitral valve regurgitation. No evidence of mitral valve stenosis. MV peak gradient, 6.0 mmHg. The mean mitral valve gradient is 2.0 mmHg. Tricuspid Valve: The tricuspid valve is normal in structure. Tricuspid valve regurgitation is mild . No evidence of tricuspid stenosis. Aortic Valve: The aortic valve is normal in structure. Aortic valve regurgitation is not visualized. No aortic stenosis is present. Aortic valve mean gradient measures 2.5 mmHg. Aortic valve peak gradient measures 4.1 mmHg. Aortic valve area, by VTI measures 2.60 cm. Pulmonic Valve: The pulmonic  valve was normal in structure. Pulmonic valve regurgitation is not visualized. No evidence of pulmonic stenosis. Aorta: The aortic root is normal in size and structure. Venous: The inferior vena cava is normal in size with greater than 50% respiratory variability, suggesting right atrial pressure of 3 mmHg. IAS/Shunts: No atrial level shunt detected by color flow Doppler.  LEFT VENTRICLE PLAX 2D LVIDd:         4.80 cm LVIDs:         3.30 cm LV PW:         1.20 cm LV IVS:        1.70 cm LVOT diam:     2.00 cm LV SV:         46 LV SV Index:   22 LVOT Area:     3.14 cm  RIGHT VENTRICLE RV S prime:     11.70 cm/s TAPSE (M-mode): 1.6 cm LEFT ATRIUM            Index        RIGHT ATRIUM           Index LA diam:      4.70 cm  2.27 cm/m   RA Area:     13.10 cm LA Vol (A2C): 61.3 ml  29.57 ml/m  RA Volume:   29.30 ml  14.13 ml/m LA Vol (A4C): 105.0 ml 50.65 ml/m  AORTIC VALVE                    PULMONIC VALVE AV Area (Vmax):    2.94 cm     PV Vmax:        0.65 m/s AV Area (Vmean):   2.87 cm     PV Vmean:       48.450 cm/s AV Area (VTI):     2.60 cm     PV VTI:         0.130 m AV Vmax:  101.00 cm/s  PV Peak grad:   1.7 mmHg AV Vmean:          73.150 cm/s  PV Mean grad:   1.0 mmHg AV VTI:            0.179 m      RVOT Peak grad: 3 mmHg AV Peak Grad:      4.1 mmHg AV Mean Grad:      2.5 mmHg LVOT Vmax:         94.60 cm/s LVOT Vmean:        66.800 cm/s LVOT VTI:          0.148 m LVOT/AV VTI ratio: 0.83  AORTA Ao Root diam: 3.33 cm MITRAL VALVE                TRICUSPID VALVE MV Area (PHT): 6.02 cm     TR Peak grad:   6.4 mmHg MV Area VTI:   1.49 cm     TR Vmax:        126.00 cm/s MV Peak grad:  6.0 mmHg MV Mean grad:  2.0 mmHg     SHUNTS MV Vmax:       1.22 m/s     Systemic VTI:  0.15 m MV Vmean:      59.4 cm/s    Systemic Diam: 2.00 cm MV Decel Time: 126 msec     Pulmonic VTI:  0.159 m MV E velocity: 131.00 cm/s Isaias Cowman MD Electronically signed by Isaias Cowman MD Signature Date/Time:  01/01/2022/1:13:17 PM    Final         Scheduled Meds:  sodium chloride   Intravenous Once   amiodarone  200 mg Oral BID   aspirin EC  81 mg Oral Daily   atorvastatin  80 mg Oral Daily   Chlorhexidine Gluconate Cloth  6 each Topical Daily   heparin  5,000 Units Subcutaneous Q12H   insulin aspart  0-15 Units Subcutaneous TID WC   insulin aspart  0-5 Units Subcutaneous QHS   insulin detemir  10 Units Subcutaneous Daily   levalbuterol  2 puff Inhalation Q8H   melatonin  5 mg Oral QHS   midodrine  10 mg Oral TID WC   multivitamin with minerals  1 tablet Oral Daily   mupirocin ointment  1 application Nasal BID   pantoprazole  40 mg Oral BID   pneumococcal 23 valent vaccine  0.5 mL Intramuscular Tomorrow-1000   sertraline  100 mg Oral BID   sodium chloride flush  3 mL Intravenous Q12H   sodium chloride flush  3 mL Intravenous Q12H   tamsulosin  0.4 mg Oral Daily   tiotropium  1 capsule Inhalation Daily   traZODone  50 mg Oral QHS   Continuous Infusions:  sodium chloride Stopped (12/30/21 1542)   sodium chloride     anidulafungin 78 mL/hr at 01/01/22 0249   meropenem (MERREM) IV 200 mL/hr at 01/01/22 0258     LOS: 5 days      Sidney Ace, MD [facility]   If 7PM-7AM, please contact night-coverage  01/01/2022, 2:04 PM

## 2022-01-01 NOTE — Progress Notes (Signed)
*  PRELIMINARY RESULTS* Echocardiogram 2D Echocardiogram has been performed.  Wayne Burns 01/01/2022, 9:02 AM

## 2022-01-01 NOTE — TOC Progression Note (Signed)
Transition of Care [facility]) - Progression Note    Patient Details  Name: Wayne Burns MRN: [id number] Date of Birth: Mar 31, 1956  [facility] CM/SW Contact  Kerin Salen, RN Phone Number: 01/01/2022, 3:22 PM  Clinical Narrative: APS Samella Parr, [phone] did speak with patient today request that patient have Psych. Evaluation to assess capacity. Evaluation done TOC to fax to [facility] to review and determine if patient able to discharge home safely. TOC to continue to track.     Expected Discharge Plan: Home/Self Care Barriers to Discharge: Continued Medical Work up  Expected Discharge Plan and Services Expected Discharge Plan: Home/Self Care   Discharge Planning Services: CM Consult   Living arrangements for the past 2 months: Mobile Home                 DME Arranged: N/A DME Agency: NA       HH Arranged: NA, Patient Refused [facility] Agency: NA         Social Determinants of Health (SDOH) Interventions    Readmission Risk Interventions No flowsheet data found.

## 2022-01-02 ENCOUNTER — Encounter
Admission: EM | Disposition: A | Discharge status: Home / Self Care | Payer: Self-pay | Source: Home / Self Care | Attending: Internal Medicine

## 2022-01-02 ENCOUNTER — Inpatient Hospital Stay: Payer: Medicare Other

## 2022-01-02 ENCOUNTER — Inpatient Hospital Stay: Payer: Medicare Other | Admitting: Certified Registered"

## 2022-01-02 ENCOUNTER — Inpatient Hospital Stay
Admit: 2022-01-02 | Discharge: 2022-01-02 | Disposition: A | Discharge status: Home / Self Care | Payer: Medicare Other | Attending: Cardiology | Admitting: Cardiology

## 2022-01-02 ENCOUNTER — Other Ambulatory Visit: Payer: Self-pay

## 2022-01-02 ENCOUNTER — Encounter: Payer: Self-pay | Admitting: Internal Medicine

## 2022-01-02 DIAGNOSIS — N184 Chronic kidney disease, stage 4 (severe): Secondary | ICD-10-CM | POA: Diagnosis not present

## 2022-01-02 DIAGNOSIS — T8389XA Other specified complication of genitourinary prosthetic devices, implants and grafts, initial encounter: Secondary | ICD-10-CM | POA: Diagnosis not present

## 2022-01-02 DIAGNOSIS — A4151 Sepsis due to Escherichia coli [E. coli]: Secondary | ICD-10-CM | POA: Diagnosis not present

## 2022-01-02 DIAGNOSIS — R7881 Bacteremia: Secondary | ICD-10-CM | POA: Diagnosis not present

## 2022-01-02 DIAGNOSIS — B377 Candidal sepsis: Secondary | ICD-10-CM

## 2022-01-02 DIAGNOSIS — N179 Acute kidney failure, unspecified: Secondary | ICD-10-CM | POA: Diagnosis not present

## 2022-01-02 HISTORY — PX: TEE WITHOUT CARDIOVERSION: SHX5443

## 2022-01-02 HISTORY — PX: CYSTOSCOPY W/ URETERAL STENT PLACEMENT: SHX1429

## 2022-01-02 LAB — CBC WITH DIFFERENTIAL/PLATELET
Abs Immature Granulocytes: 0.05 10*3/uL (ref 0.00–0.07)
Basophils Absolute: 0 10*3/uL (ref 0.0–0.1)
Basophils Relative: 1 %
Eosinophils Absolute: 0.3 10*3/uL (ref 0.0–0.5)
Eosinophils Relative: 5 %
HCT: 24.7 % — ABNORMAL LOW (ref 39.0–52.0)
Hemoglobin: 7.8 g/dL — ABNORMAL LOW (ref 13.0–17.0)
Immature Granulocytes: 1 %
Lymphocytes Relative: 21 %
Lymphs Abs: 1.2 10*3/uL (ref 0.7–4.0)
MCH: 28.3 pg (ref 26.0–34.0)
MCHC: 31.6 g/dL (ref 30.0–36.0)
MCV: 89.5 fL (ref 80.0–100.0)
Monocytes Absolute: 0.3 10*3/uL (ref 0.1–1.0)
Monocytes Relative: 5 %
Neutro Abs: 3.8 10*3/uL (ref 1.7–7.7)
Neutrophils Relative %: 67 %
Platelets: 122 10*3/uL — ABNORMAL LOW (ref 150–400)
RBC: 2.76 MIL/uL — ABNORMAL LOW (ref 4.22–5.81)
RDW: 18.6 % — ABNORMAL HIGH (ref 11.5–15.5)
WBC: 5.6 10*3/uL (ref 4.0–10.5)
nRBC: 0 % (ref 0.0–0.2)

## 2022-01-02 LAB — TYPE AND SCREEN
ABO/RH(D): O POS
Antibody Screen: NEGATIVE
Unit division: 0

## 2022-01-02 LAB — RENAL FUNCTION PANEL
Albumin: 2.3 g/dL — ABNORMAL LOW (ref 3.5–5.0)
Anion gap: 13 (ref 5–15)
BUN: 40 mg/dL — ABNORMAL HIGH (ref 8–23)
CO2: 24 mmol/L (ref 22–32)
Calcium: 8.4 mg/dL — ABNORMAL LOW (ref 8.9–10.3)
Chloride: 98 mmol/L (ref 98–111)
Creatinine, Ser: 2.7 mg/dL — ABNORMAL HIGH (ref 0.61–1.24)
GFR, Estimated: 26 mL/min — ABNORMAL LOW (ref >60)
Glucose, Bld: 95 mg/dL (ref 70–99)
Phosphorus: 3.7 mg/dL (ref 2.5–4.6)
Potassium: 4.3 mmol/L (ref 3.5–5.1)
Sodium: 135 mmol/L (ref 135–145)

## 2022-01-02 LAB — GLUCOSE, CAPILLARY
Glucose-Capillary: 86 mg/dL (ref 70–99)
Glucose-Capillary: 88 mg/dL (ref 70–99)
Glucose-Capillary: 88 mg/dL (ref 70–99)
Glucose-Capillary: 153 mg/dL — ABNORMAL HIGH (ref 70–99)
Glucose-Capillary: 185 mg/dL — ABNORMAL HIGH (ref 70–99)

## 2022-01-02 LAB — MAGNESIUM: Magnesium: 1.8 mg/dL (ref 1.7–2.4)

## 2022-01-02 LAB — BPAM RBC
Blood Product Expiration Date: 202302282359
ISSUE DATE / TIME: 202302271350
Unit Type and Rh: 9500

## 2022-01-02 LAB — HAPTOGLOBIN: Haptoglobin: 325 mg/dL (ref 32–363)

## 2022-01-02 SURGERY — ECHOCARDIOGRAM, TRANSESOPHAGEAL
Anesthesia: Moderate Sedation

## 2022-01-02 SURGERY — CYSTOSCOPY, FLEXIBLE, WITH STENT REPLACEMENT
Anesthesia: General | Laterality: Right

## 2022-01-02 MED ORDER — LACTATED RINGERS IV SOLN: INTRAVENOUS | Status: DC | PRN

## 2022-01-02 MED ORDER — SODIUM CHLORIDE FLUSH 0.9 % IV SOLN
INTRAVENOUS | Status: AC
  Filled 2022-01-02: qty 10

## 2022-01-02 MED ORDER — SODIUM CHLORIDE 0.9 % IR SOLN
Status: DC | PRN
  Administered 2022-01-02: 3000 mL via INTRAVESICAL

## 2022-01-02 MED ORDER — MIDAZOLAM HCL 2 MG/2ML IJ SOLN
INTRAMUSCULAR | Status: AC
  Filled 2022-01-02: qty 2

## 2022-01-02 MED ORDER — FENTANYL CITRATE (PF) 100 MCG/2ML IJ SOLN
INTRAMUSCULAR | Status: DC | PRN
  Administered 2022-01-02: 50 ug via INTRAVENOUS

## 2022-01-02 MED ORDER — KETAMINE HCL 50 MG/5ML IJ SOSY
PREFILLED_SYRINGE | INTRAMUSCULAR | Status: AC
  Filled 2022-01-02: qty 5

## 2022-01-02 MED ORDER — BUTAMBEN-TETRACAINE-BENZOCAINE 2-2-14 % EX AERO
INHALATION_SPRAY | CUTANEOUS | Status: AC
  Administered 2022-01-02: 2
  Filled 2022-01-02: qty 5

## 2022-01-02 MED ORDER — LIDOCAINE VISCOUS HCL 2 % MT SOLN
OROMUCOSAL | Status: AC
  Administered 2022-01-02: 15 mL
  Filled 2022-01-02: qty 15

## 2022-01-02 MED ORDER — LIDOCAINE HCL (CARDIAC) PF 50 MG/5ML IV SOSY
PREFILLED_SYRINGE | INTRAVENOUS | Status: DC | PRN
  Administered 2022-01-02: 100 mg via INTRAVENOUS

## 2022-01-02 MED ORDER — LORAZEPAM 2 MG/ML IJ SOLN
INTRAMUSCULAR | Status: DC | PRN
  Administered 2022-01-02: 2 mg via INTRAVENOUS

## 2022-01-02 MED ORDER — KETAMINE HCL 10 MG/ML IJ SOLN
INTRAMUSCULAR | Status: DC | PRN
Start: 2022-01-02 — End: 2022-01-02
  Administered 2022-01-02: 50 mg via INTRAVENOUS

## 2022-01-02 MED ORDER — IOHEXOL 180 MG/ML  SOLN
INTRAMUSCULAR | Status: DC | PRN
  Administered 2022-01-02: 20 mL

## 2022-01-02 MED ORDER — LIDOCAINE HCL URETHRAL/MUCOSAL 2 % EX GEL
CUTANEOUS | Status: DC | PRN
  Administered 2022-01-02: 1

## 2022-01-02 MED ORDER — LIDOCAINE HCL URETHRAL/MUCOSAL 2 % EX GEL
CUTANEOUS | Status: AC
  Filled 2022-01-02: qty 10

## 2022-01-02 MED ORDER — FENTANYL CITRATE (PF) 100 MCG/2ML IJ SOLN
INTRAMUSCULAR | Status: AC
  Filled 2022-01-02: qty 2

## 2022-01-02 MED ORDER — PROPOFOL 500 MG/50ML IV EMUL
INTRAVENOUS | Status: DC | PRN
  Administered 2022-01-02 (×2): 70 ug/kg/min via INTRAVENOUS

## 2022-01-02 SURGICAL SUPPLY — 22 items
BAG DRAIN CYSTO-URO LG1000N (MISCELLANEOUS) ×2 IMPLANT
BAG URINE DRAIN 2000ML AR STRL (UROLOGICAL SUPPLIES) ×1 IMPLANT
BRUSH SCRUB EZ 1% IODOPHOR (MISCELLANEOUS) ×2 IMPLANT
CATH FOL 2WAY LX 16X5 (CATHETERS) ×1 IMPLANT
CATH URETL OPEN 5X70 (CATHETERS) IMPLANT
CATH URETL OPEN END 6FR 70 (CATHETERS) ×1 IMPLANT
GAUZE 4X4 16PLY ~~LOC~~+RFID DBL (SPONGE) ×4 IMPLANT
GLOVE SURG UNDER POLY LF SZ7.5 (GLOVE) ×2 IMPLANT
GOWN STRL REUS W/ TWL XL LVL3 (GOWN DISPOSABLE) ×1 IMPLANT
GOWN STRL REUS W/TWL XL LVL3 (GOWN DISPOSABLE) ×2
GUIDEWIRE STR DUAL SENSOR (WIRE) ×2 IMPLANT
GUIDEWIRE STR ZIPWIRE 035X150 (MISCELLANEOUS) ×1 IMPLANT
IV NS IRRIG 3000ML ARTHROMATIC (IV SOLUTION) ×2 IMPLANT
KIT TURNOVER CYSTO (KITS) ×2 IMPLANT
MANIFOLD NEPTUNE II (INSTRUMENTS) ×2 IMPLANT
PACK CYSTO AR (MISCELLANEOUS) ×2 IMPLANT
SET CYSTO W/LG BORE CLAMP LF (SET/KITS/TRAYS/PACK) ×2 IMPLANT
STENT URET 6FRX24 CONTOUR (STENTS) ×1 IMPLANT
STENT URET 6FRX26 CONTOUR (STENTS) IMPLANT
SURGILUBE 2OZ TUBE FLIPTOP (MISCELLANEOUS) ×2 IMPLANT
WATER STERILE IRR 1000ML POUR (IV SOLUTION) ×2 IMPLANT
WATER STERILE IRR 500ML POUR (IV SOLUTION) ×2 IMPLANT

## 2022-01-02 NOTE — Progress Notes (Signed)
PROGRESS NOTE    Wayne Burns  ZOX:[id number] DOB: September 29, 1956 DOA: 12/26/2021 PCP: Jerene Pitch, MD    Brief Narrative:  66 year old male admitted with septic shock due to severe urosepsis and ESBL E.Coli BACTEREMIA from bilateral obstructing distal ureteral calculi, along with new onset atrial fibrillation with RVR, acute kidney injury, and metabolic acidosis.  Cardiology, urology, nephrology following.  66 year old male with a past medical history significant for COPD, hypertension, chronic urinary retention with bilateral hydronephrosis dating back to 2020 who was brought to [facility] ED on 12/26/2021 due to family concerns that he was not being able to take care of himself with poor living conditions, refusing to go to hospital, and felt to be a danger to himself.  He was IVC by his family members.   Of note he was recently hospitalized at [facility] for 10 days (12/12/21-12/22/21) for treatment of AKI secondary to bilateral obstructing distal ureteral calculi.  Hospital course was complicated by type II coronary artery disease, sepsis, renal failure, respiratory failure, and melena.  Underwent EGD on 2/16 with evidence of prior gastric ulcers but no acute bleeding.  2/26: Patient has been weaned off vasopressors.  Atrial fibrillation rate controlled on p.o. amiodarone.  Interestingly blood cultures positive for Candida glabrata.  Telemetry infectious disease consult obtained.  Added Eraxis to medication regimen.  Recommending TEE for which cardiology is aware.  2/27: Patient had TTE performed which did not demonstrate any evidence of vegetation.  TEE deferred for now.  Patient has been afebrile and overall stable and comfortable.  2/28: Status post transesophageal echocardiogram.  No vegetation noted.  Also underwent stent repositioning with urology.  Procedure was successful and uncomplicated.   Assessment & Plan:   Principal Problem:   Depression Active Problems:   Abnormal LFTs (liver function  tests)   Essential hypertension   Sepsis (HCC)   Urinary tract infection   Involuntary commitment   AKI (acute kidney injury) ([place])   Elevated troponin   Elevated glucose   Atrial fibrillation with RVR (HCC)   Ureteral stent displacement, initial encounter ([place])   COVID-19 virus infection   COPD with acute exacerbation ([place])   Septic shock ([place])  Septic shock ESBL urosepsis Candida glabrata fungemia Patient initially required with the pressor support Now weaned off Likely source of ESBL infection is urine Patient on meropenem On 2/25 blood cultures positive for Candida glabrata, unclear significance or source TTE completed 2/27, no vegetation TEE completed no vegetation on 2/28 Stent repositioned 2/28 Plan: Continue meropenem, 14 days from today, end date 3/13 Continue Eraxis, 14-day from last blood culture, and day 3/11  repeat blood culture today Monitor vitals and fever curve Continue midodrine for blood pressure support, decrease dose to 5 mg 3 times daily, continue to monitor and wean  Bilateral obstructive ureteral calculi Right ureteral stent displacement Patient had stents [facility] Noted to have proximal migration of right ureteral stent Has Foley in place placed by urology Stent repositioning on 2/28, procedure successful Plan: Stent repositioning successful DC Foley catheter, voiding trial Straight cath as needed bladder volume greater than 350 cc  New onset atrial fibrillation Initially required IV amiodarone, now weaned off Plan: Continue p.o. amiodarone 200 mg twice daily Defer anticoagulation for now considering acute anemia CHA2DS2-VASc is 1, continue aspirin 81 mg daily Telemetry monitoring  Acute kidney injury on chronic kidney disease stage IV Metabolic acidosis, improved Renal function has been fluctuating Urine output adequate AKI may be due to obstruction from migrating stent Acidosis  improved after bicarbonate infusion Plan: Daily  kidney function Nephrology follow-up  Acute on chronic anemia of chronic kidney disease No clear source of blood loss Patient's hemoglobin ranges from high sixes to low sevens Suspect reactive anemia secondary to multiple acute issues Per previous documentation patient had a recent colonoscopy at [facility] without any bleeding identified Plan: Trend daily CBC Daily aspirin as above Okay for DVT prophylaxis  Combined systolic and diastolic congestive heart failure No evidence of exacerbation  History of COVID-19 infection Essentially asymptomatic Out of treatment or isolation window  Acute metabolic encephalopathy Appears resolved Treat metabolic derangements and infection Avoid sedating medications Encourage normal sleep/wake cycle Psych consulted during this admission, does not require IVC or inpatient psychiatric hospitalization  Thrombocytopenia Improving No evidence of acute blood loss Okay for VTE prophylaxis Twice daily PPI   DVT prophylaxis: SQ heparin Code Status: Full Family Communication: None today Disposition Plan: Status is: Inpatient Remains inpatient appropriate because: Multiple acute issues, sepsis, resolving shock, atrial fibrillation.  Slowly improving.  Anticipate medical readiness for discharge once kidney function approaches baseline.  Level of care: Progressive  Consultants:  Nephrology Cardiology Urology  Procedures:  None  Antimicrobials: Meropenem Eraxis   Subjective: Patient seen and examined.  After TEE and urologic procedure.  Stable no distress.  No pain complaints.  Objective: Vitals:   01/02/22 0924 01/02/22 1118 01/02/22 1215 01/02/22 1253  BP: 104/63 111/73 117/75 125/80  Pulse: 80 88 81 87  Resp:  20 17   Temp: 97.9 F (36.6 C) 97.9 F (36.6 C) (!) 97.2 F (36.2 C) 97.7 F (36.5 C)  TempSrc:  Oral    SpO2: 96% 97% 100% 99%  Weight:  91.4 kg    Height:  5\' 9"  (1.753 m)      Intake/Output Summary (Last 24 hours) at  01/02/2022 1502 Last data filed at 01/02/2022 1146 Gross per 24 hour  Intake 1027.82 ml  Output 1751 ml  Net -723.18 ml   Filed Weights   12/28/21 1500 12/31/21 1741 01/02/22 1118  Weight: 92.6 kg 91.4 kg 91.4 kg    Examination:  General exam: No acute distress.  Mentating clearly Respiratory system: Clear to auscultation, no wheeze, room air Cardiovascular system: S1-S2, regular rate, irregular rhythm, no murmurs, no pedal edema Gastrointestinal system: Soft, NT/ND, normal bowel sounds Central nervous system: Alert and oriented. No focal neurological deficits. Extremities: Symmetric 5 x 5 power. Skin: No rashes, lesions or ulcers Psychiatry: Judgement and insight appear normal. Mood & affect appropriate.     Data Reviewed: I have personally reviewed following labs and imaging studies  CBC: Recent Labs  Lab 12/29/21 0621 12/29/21 0718 12/30/21 0624 12/30/21 0756 12/31/21 0308 12/31/21 0631 01/01/22 0529 01/01/22 1828 01/02/22 0639  WBC 19.0*  --  6.9  --  6.5 7.9 6.4  --  5.6  NEUTROABS 17.5*  --  5.8  --  5.2  --  4.9  --  3.8  HGB 6.7*   < > 6.8*   < > 6.2* 7.0* 6.7* 7.4* 7.8*  HCT 19.9*   < > 20.5*   < > 19.1* 21.9* 21.2* 22.7* 24.7*  MCV 84.7  --  84.4  --  86.0 86.2 88.0  --  89.5  PLT 181  --  127*  --  114* 128* 128*  --  122*   < > = values in this interval not displayed.   Basic Metabolic Panel: Recent Labs  Lab 12/29/21 [phone] 12/29/21 0716 12/30/21 3810 12/31/21  8921 12/31/21 0631 01/01/22 0529 01/01/22 0530 01/01/22 1714 01/02/22 0639  NA 128*   < > 131* 130* 132*  --  134* 132* 135  K 2.8*   < > 3.6 3.4* 3.6  --  3.9 4.0 4.3  CL 89*   < > 96* 93* 94*  --  95* 95* 98  CO2 29   < > 23 27 28   --  26 28 24   GLUCOSE 684*   < > 112* 118* 98  --  95 120* 95  BUN 51*   < > 59* 53* 54*  --  48* 46* 40*  CREATININE 2.84*   < > 3.18* 2.88* 2.91*  --  2.90* 2.78* 2.70*  CALCIUM 6.1*   < > 7.7* 7.5* 7.7*  --  8.2* 7.9* 8.4*  MG 1.6*  --  2.0 1.9  --  1.8   --   --  1.8  PHOS 3.6  --  3.7 3.9  --   --  4.3  --  3.7   < > = values in this interval not displayed.   GFR: Estimated Creatinine Clearance: 31.3 mL/min (A) (by C-G formula based on SCr of 2.7 mg/dL (H)). Liver Function Tests: Recent Labs  Lab 12/26/21 2020 12/27/21 0532 12/29/21 1941 12/29/21 0716 12/30/21 0624 12/31/21 0308 01/01/22 0530 01/02/22 0639  AST 31 28  --  18  --   --   --   --   ALT 55* 44  --  35  --   --   --   --   ALKPHOS 204* 167*  --  157*  --   --   --   --   BILITOT 2.1* 1.5*  --  0.7  --   --   --   --   PROT 8.1 6.8  --  7.0  --   --   --   --   ALBUMIN 2.7* 2.2*   < > 2.3* 2.1* 2.0* 2.2* 2.3*   < > = values in this interval not displayed.   No results for input(s): LIPASE, AMYLASE in the last 168 hours. No results for input(s): AMMONIA in the last 168 hours. Coagulation Profile: No results for input(s): INR, PROTIME in the last 168 hours. Cardiac Enzymes: Recent Labs  Lab 12/26/21 2020  CKTOTAL 10*   BNP (last 3 results) No results for input(s): PROBNP in the last 8760 hours. HbA1C: No results for input(s): HGBA1C in the last 72 hours. CBG: Recent Labs  Lab 01/01/22 1723 01/01/22 2034 01/02/22 0928 01/02/22 1050 01/02/22 1254  GLUCAP 121* 140* 86 88 88   Lipid Profile: No results for input(s): CHOL, HDL, LDLCALC, TRIG, CHOLHDL, LDLDIRECT in the last 72 hours. Thyroid Function Tests: No results for input(s): TSH, T4TOTAL, FREET4, T3FREE, THYROIDAB in the last 72 hours. Anemia Panel: Recent Labs    12/31/21 1239  VITAMINB12 865  FOLATE 4.3*  FERRITIN 196  TIBC 270  IRON 16*   Sepsis Labs: Recent Labs  Lab 12/27/21 1108 12/27/21 1407 12/28/21 0500 12/28/21 1100 12/28/21 1613 12/28/21 2157 12/29/21 0621  PROCALCITON 1.78  --  1.43  --   --   --  0.67  LATICACIDVEN  --    < > 2.0* 2.1* 2.2* 2.1* 1.8   < > = values in this interval not displayed.    Recent Results (from the past 240 hour(s))  Resp Panel by RT-PCR  (Flu A&B, Covid)     Status: Abnormal  Collection Time: 12/26/21 10:33 PM   Specimen: Nasopharyngeal(NP) swabs in vial transport medium  Result Value Ref Range Status   SARS Coronavirus 2 by RT PCR POSITIVE (A) NEGATIVE Final    Comment: (NOTE) SARS-CoV-2 target nucleic acids are DETECTED.  The SARS-CoV-2 RNA is generally detectable in upper respiratory specimens during the acute phase of infection. Positive results are indicative of the presence of the identified virus, but do not rule out bacterial infection or co-infection with other pathogens not detected by the test. Clinical correlation with patient history and other diagnostic information is necessary to determine patient infection status. The expected result is Negative.  Fact Sheet for Patients: EntrepreneurPulse.com.au  Fact Sheet for Healthcare Providers: IncredibleEmployment.be  This test is not yet approved or cleared by the [place] FDA and  has been authorized for detection and/or diagnosis of SARS-CoV-2 by FDA under an Emergency Use Authorization (EUA).  This EUA will remain in effect (meaning this test can be used) for the duration of  the COVID-19 declaration under Section 564(b)(1) of the A ct, 21 U.S.C. section 360bbb-3(b)(1), unless the authorization is terminated or revoked sooner.     Influenza A by PCR NEGATIVE NEGATIVE Final   Influenza B by PCR NEGATIVE NEGATIVE Final    Comment: (NOTE) The Xpert Xpress SARS-CoV-2/FLU/RSV plus assay is intended as an aid in the diagnosis of influenza from Nasopharyngeal swab specimens and should not be used as a sole basis for treatment. Nasal washings and aspirates are unacceptable for Xpert Xpress SARS-CoV-2/FLU/RSV testing.  Fact Sheet for Patients: EntrepreneurPulse.com.au  Fact Sheet for Healthcare Providers: IncredibleEmployment.be  This test is not yet approved or cleared by the  [place] FDA and has been authorized for detection and/or diagnosis of SARS-CoV-2 by FDA under an Emergency Use Authorization (EUA). This EUA will remain in effect (meaning this test can be used) for the duration of the COVID-19 declaration under Section 564(b)(1) of the Act, 21 U.S.C. section 360bbb-3(b)(1), unless the authorization is terminated or revoked.  Performed at [facility], [address] [place], [place] [address]   Blood culture (routine x 2)     Status: None   Collection Time: 12/26/21 10:33 PM   Specimen: BLOOD  Result Value Ref Range Status   Specimen Description BLOOD LEFT ASSIST CONTROL  Final   Special Requests   Final    BOTTLES DRAWN AEROBIC AND ANAEROBIC Blood Culture adequate volume   Culture   Final    NO GROWTH 5 DAYS Performed at [facility], [address] [place], [place] [address]    Report Status 12/31/2021 FINAL  Final  Blood culture (routine x 2)     Status: Abnormal   Collection Time: 12/26/21 10:46 PM   Specimen: BLOOD  Result Value Ref Range Status   Specimen Description   Final    BLOOD RIGHT ANTECUBITAL Performed at [facility], [place] [address], [place], [place] [address]    Special Requests   Final    BOTTLES DRAWN AEROBIC AND ANAEROBIC Blood Culture adequate volume Performed at [facility], [address] [place],  [address]    Culture  Setup Time   Final    GRAM NEGATIVE RODS ANAEROBIC BOTTLE ONLY Organism ID to follow CRITICAL RESULT CALLED TO, READ BACK BY AND VERIFIED WITH: [facility] MITCHELL RJJOAC [id number] 12/27/21 HNM    Culture (A)  Final    ESCHERICHIA COLI Confirmed Extended Spectrum Beta-Lactamase Producer (ESBL).  In bloodstream infections from ESBL  organisms, carbapenems are preferred over piperacillin/tazobactam. They are shown to have a lower risk of mortality.    Report Status 12/29/2021 FINAL  Final   Organism ID, Bacteria ESCHERICHIA COLI  Final      Susceptibility    Escherichia coli - MIC*    AMPICILLIN >=32 RESISTANT Resistant     CEFAZOLIN >=64 RESISTANT Resistant     CEFEPIME 16 RESISTANT Resistant     CEFTAZIDIME RESISTANT Resistant     CEFTRIAXONE >=64 RESISTANT Resistant     CIPROFLOXACIN <=0.25 SENSITIVE Sensitive     GENTAMICIN <=1 SENSITIVE Sensitive     IMIPENEM <=0.25 SENSITIVE Sensitive     TRIMETH/SULFA <=20 SENSITIVE Sensitive     AMPICILLIN/SULBACTAM 8 SENSITIVE Sensitive     PIP/TAZO <=4 SENSITIVE Sensitive     * ESCHERICHIA COLI  Blood Culture ID Panel (Reflexed)     Status: Abnormal   Collection Time: 12/26/21 10:46 PM  Result Value Ref Range Status   Enterococcus faecalis NOT DETECTED NOT DETECTED Final   Enterococcus Faecium NOT DETECTED NOT DETECTED Final   Listeria monocytogenes NOT DETECTED NOT DETECTED Final   Staphylococcus species NOT DETECTED NOT DETECTED Final   Staphylococcus aureus (BCID) NOT DETECTED NOT DETECTED Final   Staphylococcus epidermidis NOT DETECTED NOT DETECTED Final   Staphylococcus lugdunensis NOT DETECTED NOT DETECTED Final   Streptococcus species NOT DETECTED NOT DETECTED Final   Streptococcus agalactiae NOT DETECTED NOT DETECTED Final   Streptococcus pneumoniae NOT DETECTED NOT DETECTED Final   Streptococcus pyogenes NOT DETECTED NOT DETECTED Final   A.calcoaceticus-baumannii NOT DETECTED NOT DETECTED Final   Bacteroides fragilis NOT DETECTED NOT DETECTED Final   Enterobacterales DETECTED (A) NOT DETECTED Final    Comment: Enterobacterales represent a large order of gram negative bacteria, not a single organism. CRITICAL RESULT CALLED TO, READ BACK BY AND VERIFIED WITH: DEVAN MITCHELL PHARMD 1126 12/27/21 HNM    Enterobacter cloacae complex NOT DETECTED NOT DETECTED Final   Escherichia coli DETECTED (A) NOT DETECTED Final    Comment: CRITICAL RESULT CALLED TO, READ BACK BY AND VERIFIED WITH: DEVAN MITCHELL LYYTKP [id number] 12/27/21 HNM    Klebsiella aerogenes NOT DETECTED NOT DETECTED Final    Klebsiella oxytoca NOT DETECTED NOT DETECTED Final   Klebsiella pneumoniae NOT DETECTED NOT DETECTED Final   Proteus species NOT DETECTED NOT DETECTED Final   Salmonella species NOT DETECTED NOT DETECTED Final   Serratia marcescens NOT DETECTED NOT DETECTED Final   Haemophilus influenzae NOT DETECTED NOT DETECTED Final   Neisseria meningitidis NOT DETECTED NOT DETECTED Final   Pseudomonas aeruginosa NOT DETECTED NOT DETECTED Final   Stenotrophomonas maltophilia NOT DETECTED NOT DETECTED Final   Candida albicans NOT DETECTED NOT DETECTED Final   Candida auris NOT DETECTED NOT DETECTED Final   Candida glabrata NOT DETECTED NOT DETECTED Final   Candida krusei NOT DETECTED NOT DETECTED Final   Candida parapsilosis NOT DETECTED NOT DETECTED Final   Candida tropicalis NOT DETECTED NOT DETECTED Final   Cryptococcus neoformans/gattii NOT DETECTED NOT DETECTED Final   CTX-M ESBL DETECTED (A) NOT DETECTED Final    Comment: CRITICAL RESULT CALLED TO, READ BACK BY AND VERIFIED WITH: DEVAN MITCHELL PHARMD 1126 12/27/21 HNM (NOTE) Extended spectrum beta-lactamase detected. Recommend a carbapenem as initial therapy.      Carbapenem resistance IMP NOT DETECTED NOT DETECTED Final   Carbapenem resistance KPC NOT DETECTED NOT DETECTED Final   Carbapenem resistance NDM NOT DETECTED NOT DETECTED Final   Carbapenem resist OXA 48 LIKE NOT DETECTED  NOT DETECTED Final   Carbapenem resistance VIM NOT DETECTED NOT DETECTED Final    Comment: Performed at [facility], [address], [place], [place] [address]  Urine Culture     Status: Abnormal   Collection Time: 12/27/21  9:59 AM   Specimen: Urine, Clean Catch  Result Value Ref Range Status   Specimen Description   Final    URINE, CLEAN CATCH Performed at [facility], [address], [place], [place] [address]    Special Requests   Final    NONE Performed at [facility], [address] [place] [address], [place], [place] [address]     Culture (A)  Final    <10,000 COLONIES/mL INSIGNIFICANT GROWTH Performed at [facility], [place] [address], [place], [place] [address]    Report Status 12/28/2021 FINAL  Final  Antifungal AST 9 Drug Panel     Status: None (Preliminary result)   Collection Time: 12/28/21  6:21 AM  Result Value Ref Range Status   Organism ID, Yeast Preliminary report  Final    Comment: (NOTE) Specimen has been received and testing has been initiated. Performed At: [facility], [place] [id number] Rush Farmer MD VP:[id number]    Amphotericin B MIC PENDING  Incomplete   Anidulafungin MIC PENDING  Incomplete   Caspofungin MIC PENDING  Incomplete   Micafungin MIC PENDING  Incomplete   Posaconazole MIC PENDING  Incomplete   Fluconazole Islt MIC PENDING  Incomplete   Flucytosine MIC PENDING  Incomplete   Itraconazole MIC PENDING  Incomplete   Voriconazole MIC PENDING  Incomplete   Source CANDIDA GLABRATA FROM BLOOD  Final    Comment: Performed at [facility], [place] [place], [place] [address]  MRSA Next Gen by PCR, Nasal     Status: Abnormal   Collection Time: 12/28/21  3:10 PM   Specimen: Nasal Mucosa; Nasal Swab  Result Value Ref Range Status   MRSA by PCR Next Gen DETECTED (A) NOT DETECTED Final    Comment: RESULT CALLED TO, READ BACK BY AND VERIFIED WITH: GINA SHANNON 1701 12/28/21 MU (NOTE) The GeneXpert MRSA Assay (FDA approved for NASAL specimens only), is one component of a comprehensive MRSA colonization surveillance program. It is not intended to diagnose MRSA infection nor to guide or monitor treatment for MRSA infections. Test performance is not FDA approved in patients less than 44 years old. Performed at [facility], [address] [place], [place] [address]   CULTURE, BLOOD (ROUTINE X 2) w Reflex to ID Panel     Status: None (Preliminary result)   Collection Time: 12/29/21  6:21 AM   Specimen: BLOOD  Result Value Ref  Range Status   Specimen Description BLOOD RIGHT [facility]  Final   Special Requests   Final    BOTTLES DRAWN AEROBIC AND ANAEROBIC Blood Culture adequate volume   Culture   Final    NO GROWTH 3 DAYS Performed at [facility], [address], [place], [place] [address]    Report Status PENDING  Incomplete  CULTURE, BLOOD (ROUTINE X 2) w Reflex to ID Panel     Status: Abnormal (Preliminary result)   Collection Time: 12/29/21  6:21 AM   Specimen: BLOOD  Result Value Ref Range Status   Specimen Description   Final    BLOOD RIGHT HAND Performed at [facility], [address], [place],  [address]    Special Requests   Final  IN PEDIATRIC BOTTLE Blood Culture adequate volume Performed at [facility], [address] [place], [place] [address]    Culture  Setup Time   Final    YEAST IN PEDIATRIC BOTTLE Organism ID to follow CRITICAL RESULT CALLED TO, READ BACK BY AND VERIFIED WITH: BRANDON BEERS @1310  12/30/21 SCS    Culture (A)  Final    CANDIDA GLABRATA Sent to [place] for further susceptibility testing. Performed at [facility], [place] [address], [place], [place] [address]    Report Status PENDING  Incomplete  Blood Culture ID Panel (Reflexed)     Status: Abnormal   Collection Time: 12/29/21  6:21 AM  Result Value Ref Range Status   Enterococcus faecalis NOT DETECTED NOT DETECTED Final   Enterococcus Faecium NOT DETECTED NOT DETECTED Final   Listeria monocytogenes NOT DETECTED NOT DETECTED Final   Staphylococcus species NOT DETECTED NOT DETECTED Final   Staphylococcus aureus (BCID) NOT DETECTED NOT DETECTED Final   Staphylococcus epidermidis NOT DETECTED NOT DETECTED Final   Staphylococcus lugdunensis NOT DETECTED NOT DETECTED Final   Streptococcus species NOT DETECTED NOT DETECTED Final   Streptococcus agalactiae NOT DETECTED NOT DETECTED Final   Streptococcus pneumoniae NOT DETECTED NOT DETECTED Final   Streptococcus pyogenes NOT  DETECTED NOT DETECTED Final   A.calcoaceticus-baumannii NOT DETECTED NOT DETECTED Final   Bacteroides fragilis NOT DETECTED NOT DETECTED Final   Enterobacterales NOT DETECTED NOT DETECTED Final   Enterobacter cloacae complex NOT DETECTED NOT DETECTED Final   Escherichia coli NOT DETECTED NOT DETECTED Final   Klebsiella aerogenes NOT DETECTED NOT DETECTED Final   Klebsiella oxytoca NOT DETECTED NOT DETECTED Final   Klebsiella pneumoniae NOT DETECTED NOT DETECTED Final   Proteus species NOT DETECTED NOT DETECTED Final   Salmonella species NOT DETECTED NOT DETECTED Final   Serratia marcescens NOT DETECTED NOT DETECTED Final   Haemophilus influenzae NOT DETECTED NOT DETECTED Final   Neisseria meningitidis NOT DETECTED NOT DETECTED Final   Pseudomonas aeruginosa NOT DETECTED NOT DETECTED Final   Stenotrophomonas maltophilia NOT DETECTED NOT DETECTED Final   Candida albicans NOT DETECTED NOT DETECTED Final   Candida auris NOT DETECTED NOT DETECTED Final   Candida glabrata DETECTED (A) NOT DETECTED Final    Comment: CRITICAL RESULT CALLED TO, READ BACK BY AND VERIFIED WITH: BRANDON BEERS @1310  12/30/21 SCS    Candida krusei NOT DETECTED NOT DETECTED Final   Candida parapsilosis NOT DETECTED NOT DETECTED Final   Candida tropicalis NOT DETECTED NOT DETECTED Final   Cryptococcus neoformans/gattii NOT DETECTED NOT DETECTED Final    Comment: Performed at [facility], [address] [place], [place] [address]  CULTURE, BLOOD (ROUTINE X 2) w Reflex to ID Panel     Status: None (Preliminary result)   Collection Time: 12/31/21  3:08 AM   Specimen: BLOOD  Result Value Ref Range Status   Specimen Description BLOOD BLOOD RIGHT HAND  Final   Special Requests   Final    BOTTLES DRAWN AEROBIC AND ANAEROBIC Blood Culture adequate volume   Culture   Final    NO GROWTH 1 DAY Performed at [facility], [address] [place],  [address]    Report Status PENDING  Incomplete   CULTURE, BLOOD (ROUTINE X 2) w Reflex to ID Panel     Status: None (Preliminary result)   Collection Time: 12/31/21  3:08 AM   Specimen: BLOOD  Result Value Ref Range Status   Specimen Description BLOOD BLOOD LEFT HAND  Final   Special Requests   Final    BOTTLES DRAWN AEROBIC AND ANAEROBIC Blood Culture adequate volume   Culture   Final    NO GROWTH 1 DAY Performed at [facility], [address] [place], [place] [address]    Report Status PENDING  Incomplete         Radiology Studies: DG OR UROLOGY CYSTO IMAGE ([place])  Result Date: 01/02/2022 There is no interpretation for this exam.  This order is for images obtained during a surgical procedure.  Please See "Surgeries" Tab for more information regarding the procedure.   ECHOCARDIOGRAM COMPLETE  Result Date: 01/01/2022    ECHOCARDIOGRAM REPORT   Patient Name:   [facility] Date of Exam: 01/01/2022 Medical Rec #:  [id number]  Height:       69.0 in Accession #:    [id number] Weight:       201.6 lb Date of Birth:  [birth year] BSA:          2.073 m Patient Age:    15 years   BP:           108/77 mmHg Patient Gender: M          HR:           90 bpm. Exam Location:  [facility] Procedure: 2D Echo, Color Doppler and Cardiac Doppler Indications:     Bacteremia R78.81  History:         Patient has no prior history of Echocardiogram examinations.                  COPD; Risk Factors:Hypertension.  Sonographer:     Sherrie Sport Referring Phys:  [id number] Sidney Ace Diagnosing Phys: Isaias Cowman MD  Sonographer Comments: Suboptimal apical window. IMPRESSIONS  1. Left ventricular ejection fraction, by estimation, is 60 to 65%. The left ventricle has normal function. The left ventricle has no regional wall motion abnormalities. Left ventricular diastolic parameters are indeterminate.  2. Right ventricular systolic function is normal. The right ventricular size is normal.  3. The mitral valve is normal in structure. Mild to moderate  mitral valve regurgitation. No evidence of mitral stenosis.  4. The aortic valve is normal in structure. Aortic valve regurgitation is not visualized. No aortic stenosis is present.  5. The inferior vena cava is normal in size with greater than 50% respiratory variability, suggesting right atrial pressure of 3 mmHg. Conclusion(s)/Recommendation(s): No evidence of valvular vegetations on this transthoracic echocardiogram. Consider a transesophageal echocardiogram to exclude infective endocarditis if clinically indicated. FINDINGS  Left Ventricle: Left ventricular ejection fraction, by estimation, is 60 to 65%. The left ventricle has normal function. The left ventricle has no regional wall motion abnormalities. The left ventricular internal cavity size was normal in size. There is  no left ventricular hypertrophy. Left ventricular diastolic parameters are indeterminate. Right Ventricle: The right ventricular size is normal. No increase in right ventricular wall thickness. Right ventricular systolic function is normal. Left Atrium: Left atrial size was normal in size. Right Atrium: Right atrial size was normal in size. Pericardium: There is no evidence of pericardial effusion. Mitral Valve: The mitral valve is normal in structure. Mild to moderate mitral valve regurgitation. No evidence of mitral valve stenosis. MV peak gradient, 6.0 mmHg. The mean mitral valve gradient is 2.0 mmHg. Tricuspid Valve: The tricuspid valve is normal in structure. Tricuspid valve regurgitation is mild . No evidence of tricuspid stenosis. Aortic Valve: The aortic valve is normal in structure.  Aortic valve regurgitation is not visualized. No aortic stenosis is present. Aortic valve mean gradient measures 2.5 mmHg. Aortic valve peak gradient measures 4.1 mmHg. Aortic valve area, by VTI measures 2.60 cm. Pulmonic Valve: The pulmonic valve was normal in structure. Pulmonic valve regurgitation is not visualized. No evidence of pulmonic stenosis.  Aorta: The aortic root is normal in size and structure. Venous: The inferior vena cava is normal in size with greater than 50% respiratory variability, suggesting right atrial pressure of 3 mmHg. IAS/Shunts: No atrial level shunt detected by color flow Doppler.  LEFT VENTRICLE PLAX 2D LVIDd:         4.80 cm LVIDs:         3.30 cm LV PW:         1.20 cm LV IVS:        1.70 cm LVOT diam:     2.00 cm LV SV:         46 LV SV Index:   22 LVOT Area:     3.14 cm  RIGHT VENTRICLE RV S prime:     11.70 cm/s TAPSE (M-mode): 1.6 cm LEFT ATRIUM            Index        RIGHT ATRIUM           Index LA diam:      4.70 cm  2.27 cm/m   RA Area:     13.10 cm LA Vol (A2C): 61.3 ml  29.57 ml/m  RA Volume:   29.30 ml  14.13 ml/m LA Vol (A4C): 105.0 ml 50.65 ml/m  AORTIC VALVE                    PULMONIC VALVE AV Area (Vmax):    2.94 cm     PV Vmax:        0.65 m/s AV Area (Vmean):   2.87 cm     PV Vmean:       48.450 cm/s AV Area (VTI):     2.60 cm     PV VTI:         0.130 m AV Vmax:           101.00 cm/s  PV Peak grad:   1.7 mmHg AV Vmean:          73.150 cm/s  PV Mean grad:   1.0 mmHg AV VTI:            0.179 m      RVOT Peak grad: 3 mmHg AV Peak Grad:      4.1 mmHg AV Mean Grad:      2.5 mmHg LVOT Vmax:         94.60 cm/s LVOT Vmean:        66.800 cm/s LVOT VTI:          0.148 m LVOT/AV VTI ratio: 0.83  AORTA Ao Root diam: 3.33 cm MITRAL VALVE                TRICUSPID VALVE MV Area (PHT): 6.02 cm     TR Peak grad:   6.4 mmHg MV Area VTI:   1.49 cm     TR Vmax:        126.00 cm/s MV Peak grad:  6.0 mmHg MV Mean grad:  2.0 mmHg     SHUNTS MV Vmax:       1.22 m/s     Systemic VTI:  0.15 m MV Vmean:  59.4 cm/s    Systemic Diam: 2.00 cm MV Decel Time: 126 msec     Pulmonic VTI:  0.159 m MV E velocity: 131.00 cm/s Isaias Cowman MD Electronically signed by Isaias Cowman MD Signature Date/Time: 01/01/2022/1:13:17 PM    Final    ECHO TEE  Result Date: 01/02/2022    TRANSESOPHOGEAL ECHO REPORT   Patient Name:    [facility] Date of Exam: 01/02/2022 Medical Rec #:  [id number]  Height:       69.0 in Accession #:    [id number] Weight:       201.6 lb Date of Birth:  11-15-1955 BSA:          2.073 m Patient Age:    88 years   BP:           116/94 mmHg Patient Gender: M          HR:           84 bpm. Exam Location:  [facility] Procedure: Transesophageal Echo, Cardiac Doppler, Color Doppler and Saline            Contrast Bubble Study Indications:     Not listed on TEE check-in sheet  History:         Patient has prior history of Echocardiogram examinations, most                  recent 01/01/2022. COPD; Risk Factors:Hypertension.  Sonographer:     Sherrie Sport Referring Phys:  [id number] [place] TANG Diagnosing Phys: Serafina Royals MD PROCEDURE: The transesophogeal probe was passed without difficulty through the esophogus of the patient. Sedation performed by performing physician. The patient developed no complications during the procedure. IMPRESSIONS  1. Left ventricular ejection fraction, by estimation, is 50 to 55%. The left ventricle has low normal function. The left ventricle has no regional wall motion abnormalities.  2. Right ventricular systolic function is normal. The right ventricular size is normal.  3. No left atrial/left atrial appendage thrombus was detected.  4. The mitral valve is normal in structure. Mild mitral valve regurgitation.  5. The aortic valve is normal in structure. Aortic valve regurgitation is trivial.  6. There is Moderate (Grade III) plaque.  7. Agitated saline contrast bubble study was negative, with no evidence of any interatrial shunt. FINDINGS  Left Ventricle: Left ventricular ejection fraction, by estimation, is 50 to 55%. The left ventricle has low normal function. The left ventricle has no regional wall motion abnormalities. The left ventricular internal cavity size was normal in size. Right Ventricle: The right ventricular size is normal. No increase in right ventricular wall thickness. Right  ventricular systolic function is normal. Left Atrium: Left atrial size was normal in size. Spontaneous echo contrast was present. No left atrial/left atrial appendage thrombus was detected. Right Atrium: Right atrial size was normal in size. Pericardium: There is no evidence of pericardial effusion. Mitral Valve: The mitral valve is normal in structure. Mild mitral valve regurgitation. There is no evidence of mitral valve vegetation. Tricuspid Valve: The tricuspid valve is normal in structure. Tricuspid valve regurgitation is mild. There is no evidence of tricuspid valve vegetation. Aortic Valve: The aortic valve is normal in structure. Aortic valve regurgitation is trivial. There is no evidence of aortic valve vegetation. Pulmonic Valve: The pulmonic valve was normal in structure. Pulmonic valve regurgitation is trivial. There is no evidence of pulmonic valve vegetation. Aorta: The aortic root and ascending aorta are structurally normal, with no evidence of dilitation. There  is moderate (Grade III) plaque. IAS/Shunts: No atrial level shunt detected by color flow Doppler. Agitated saline contrast was given intravenously to evaluate for intracardiac shunting. Agitated saline contrast bubble study was negative, with no evidence of any interatrial shunt. There  is no evidence of a patent foramen ovale. There is no evidence of an atrial septal defect. Serafina Royals MD Electronically signed by Serafina Royals MD Signature Date/Time: 01/02/2022/8:53:08 AM    Final         Scheduled Meds:  amiodarone  200 mg Oral BID   aspirin EC  81 mg Oral Daily   atorvastatin  80 mg Oral Daily   Chlorhexidine Gluconate Cloth  6 each Topical Daily   fentaNYL       heparin  5,000 Units Subcutaneous Q12H   insulin aspart  0-15 Units Subcutaneous TID WC   insulin aspart  0-5 Units Subcutaneous QHS   insulin detemir  10 Units Subcutaneous Daily   levalbuterol  2 puff Inhalation Q8H   melatonin  5 mg Oral QHS   midazolam        midodrine  5 mg Oral TID WC   multivitamin with minerals  1 tablet Oral Daily   mupirocin ointment  1 application Nasal BID   pantoprazole  40 mg Oral BID   pneumococcal 23 valent vaccine  0.5 mL Intramuscular Tomorrow-1000   sertraline  100 mg Oral BID   sodium chloride flush  3 mL Intravenous Q12H   sodium chloride flush  3 mL Intravenous Q12H   tamsulosin  0.4 mg Oral Daily   tiotropium  1 capsule Inhalation Daily   traZODone  50 mg Oral QHS   Continuous Infusions:  sodium chloride Stopped (12/30/21 1542)   sodium chloride Stopped (01/02/22 0910)   anidulafungin 78 mL/hr at 01/02/22 0213   meropenem (MERREM) IV 1 g (01/02/22 1411)     LOS: 6 days      Sidney Ace, MD [facility]   If 7PM-7AM, please contact night-coverage  01/02/2022, 3:02 PM

## 2022-01-02 NOTE — Progress Notes (Signed)
Foley cath removed at 15:45 per order / tolerated well/ will bladder scan q6hrs as ordered

## 2022-01-02 NOTE — Op Note (Signed)
Preoperative diagnosis:  Proximal migration right ureteral stent  Postoperative diagnosis:  Same  Procedure: Cystoscopy with right ureteral stent exchange  Surgeon: Abbie Sons, MD  Anesthesia: MAC  Complications: None  Intraoperative findings:  Cystoscopy: Urethra normal, without stricture.  Moderate lateral lobe enlargement prostate.  Bladder mucosa without solid or papillary lesions.  Inflammatory changes secondary to indwelling stents and Foley catheter. Retrograde pyelogram: S-curve of the ureter in the mid proximal portion.  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   EBL: Minimal  Specimens: None  Indication: Wayne Burns is a 66 y.o. male with a history of sepsis secondary to bilateral obstructing ureteral calculi.  Refer to my clinical summary and consult note of 12/27/2021.  He presents today for repositioning of his right ureteral stent.  After reviewing the management options for treatment, he elected to proceed with the above surgical procedure(s). We have discussed the potential benefits and risks of the procedure, side effects of the proposed treatment, the likelihood of the patient achieving the goals of the procedure, and any potential problems that might occur during the procedure or recuperation. Informed consent has been obtained.  Description of procedure:  The patient was taken to the operating room and sedation was obtained by anesthesia.  The patient was placed in the dorsal lithotomy position, his Foley catheter was removed and external genitalia were prepped and draped in the usual sterile fashion. A preoperative time-out was performed.   A 21 French cystoscope was lubricated, passed per urethra and advanced proximally under direct vision with findings as described above.    The distal portion of the right ureteral stent was elongated line across the trigone.  It was grasped with endoscopic forceps and brought out to the urethral meatus.   A 0.038 sensor wire was placed through the stent and advanced proximally however would not negotiate the S-curve in the proximal ureter.  A 6 French open ended ureteral catheter was placed over the guidewire and positioned just distal to the S-curve.  A 0.038 zip wire was then placed ureteral catheter and was negotiated through the S-curve but was in the narrowed portion of the bifid collecting system.  The wire was backed out slightly and able to be readvanced into the main right renal pelvis.  The 6 French catheter was then able to be advanced and straightened out the ureter.  The zip wire was removed and replaced with a sensor wire.  The ureteral catheter was then removed.  A 53F/24 cm Contour ureteral stent was then advanced over the wire under fluoroscopic guidance.  A good curl was seen in the main renal pelvis and bladder under fluoroscopy.  All instruments removed and an 60 French Foley catheter was replaced to gravity drainage.  He was then transported to PACU in stable condition.  Plan: Bilateral ureteroscopy for definitive stone treatment once medically cleared for the procedure   Abbie Sons, M.D.

## 2022-01-02 NOTE — CV Procedure (Signed)
Transesophageal echocardiogram preliminary report  Wayne Burns [id number] 15-Sep-1956  Preliminary diagnosis  Bacteremia with possible endocarditis  Postprocedural diagnosis Normal LV function without evidence of endocarditis or vegetation  Time out A timeout was performed by the nursing staff and physicians specifically identifying the procedure performed, identification of the patient, the type of sedation, all allergies and medications, all pertinent medical history, and presedation assessment of nasopharynx. The patient and or family understand the risks of the procedure including the rare risks of death, stroke, heart attack, esophogeal perforation, sore throat, and reaction to medications given.  Moderate sedation During this procedure the patient has received Versed 2 milligrams and fentanyl 50 micrograms to achieve appropriate moderate sedation.  The patient had continued monitoring of heart rate, oxygenation, blood pressure, respiratory rate, and extent of signs of sedation throughout the entire procedure.  The patient received this moderate sedation over a period of 12 minutes.  Both the nursing staff and I were present during the procedure when the patient had moderate sedation for 100% of the time.  Treatment considerations  No additional treatment considerations needed for bacteremia due to no current evidence of endocarditis  For further details of transesophageal echocardiogram please refer to final report.  Signed,  Corey Skains M.D. [facility] 01/02/2022 8:38 AM

## 2022-01-02 NOTE — Anesthesia Postprocedure Evaluation (Signed)
Anesthesia Post Note  Patient: Wayne Burns  Procedure(s) Performed: CYSTOSCOPY WITH STENT REPLACEMENT (Right)  Patient location during evaluation: PACU Anesthesia Type: General Level of consciousness: awake and alert Pain management: pain level controlled Vital Signs Assessment: post-procedure vital signs reviewed and stable Respiratory status: spontaneous breathing, nonlabored ventilation, respiratory function stable and patient connected to nasal cannula oxygen Cardiovascular status: blood pressure returned to baseline and stable Postop Assessment: no apparent nausea or vomiting Anesthetic complications: no   No notable events documented.   Last Vitals:  Vitals:   01/02/22 1215 01/02/22 1253  BP: 117/75 125/80  Pulse: 81 87  Resp: 17   Temp: (!) 36.2 C 36.5 C  SpO2: 100% 99%    Last Pain:  Vitals:   01/02/22 1230  TempSrc:   PainSc: 0-No pain                 Martha Clan

## 2022-01-02 NOTE — Progress Notes (Signed)
Completed at 0822 pre procedure start

## 2022-01-02 NOTE — Progress Notes (Signed)
[facility]  Date of Admission:  12/26/2021           Reason for visit: Follow up on ESBL Bacteremia and Candidemia  Current antibiotics: Meropenem 2/22-present Eraxis 2/25- present  ASSESSMENT:    66 y.o. male admitted with:  ESBL E. coli bacteremia: Secondary to a urinary source.  Status post cystoscopy with stent replacement today by urology.  Currently on meropenem with repeat blood cultures no growth. Candida glabrata fungemia: Isolated in 1 out of 3 bottles from 12/29/2021.  Repeat blood cultures are no growth at this time and TEE performed today was negative.  He is currently on anidulafungin. History of bilateral obstructive ureteral calculi: Had history of stent placement at [facility] and was noted to have proximal migration of right ureteral stent this admission status post stent repositioning/replacement 01/02/2022. Acute kidney injury on CKD 4: Creatinine today is improved to 2.7 with a creatinine clearance of 31.3. Sepsis: Due to #1 and #2.  Improved.   RECOMMENDATIONS:    Continue current antimicrobials Would treat ESBL bacteremia for 2 weeks from date of his stent procedure today with end date 01/16/2022.  Continue meropenem as is since oral options are limited with his drug interactions, comorbidities, and CKD. Would treat his candidemia for 2 weeks from date of negative blood cultures with end date 01/14/2022.  Continue anidulafungin for this. Available as needed, please call with questions.   Principal Problem:   Depression Active Problems:   Abnormal LFTs (liver function tests)   Essential hypertension   Sepsis (HCC)   Urinary tract infection   Involuntary commitment   AKI (acute kidney injury) ([place])   Elevated troponin   Elevated glucose   Atrial fibrillation with RVR (HCC)   Ureteral stent displacement, initial encounter ([place])   COVID-19 virus infection   COPD with acute exacerbation (HCC)   Septic shock (HCC)    MEDICATIONS:     Scheduled Meds:  amiodarone  200 mg Oral BID   aspirin EC  81 mg Oral Daily   atorvastatin  80 mg Oral Daily   Chlorhexidine Gluconate Cloth  6 each Topical Daily   fentaNYL       heparin  5,000 Units Subcutaneous Q12H   insulin aspart  0-15 Units Subcutaneous TID WC   insulin aspart  0-5 Units Subcutaneous QHS   insulin detemir  10 Units Subcutaneous Daily   levalbuterol  2 puff Inhalation Q8H   melatonin  5 mg Oral QHS   midazolam       midodrine  5 mg Oral TID WC   multivitamin with minerals  1 tablet Oral Daily   mupirocin ointment  1 application Nasal BID   pantoprazole  40 mg Oral BID   pneumococcal 23 valent vaccine  0.5 mL Intramuscular Tomorrow-1000   sertraline  100 mg Oral BID   sodium chloride flush  3 mL Intravenous Q12H   sodium chloride flush  3 mL Intravenous Q12H   tamsulosin  0.4 mg Oral Daily   tiotropium  1 capsule Inhalation Daily   traZODone  50 mg Oral QHS   Continuous Infusions:  sodium chloride Stopped (12/30/21 1542)   sodium chloride Stopped (01/02/22 0910)   anidulafungin 100 mg (01/02/22 1550)   meropenem (MERREM) IV 1 g (01/02/22 1411)   PRN Meds:.sodium chloride, acetaminophen **OR** acetaminophen, menthol-cetylpyridinium, ondansetron **OR** ondansetron (ZOFRAN) IV, oxybutynin, oxyCODONE, sodium chloride flush  SUBJECTIVE:   24 hour events:  Status post TEE and  stent repositioning today No other acute events noted Patient with no new complaints He discusses his intolerance to atorvastatin in the funny smell of his antidepressants He is eating lunch   Review of Systems  All other systems reviewed and are negative.    OBJECTIVE:   Blood pressure 110/66, pulse 90, temperature 97.7 F (36.5 C), resp. rate 17, height 5\' 9"  (1.753 m), weight 91.4 kg, SpO2 100 %. Body mass index is 29.76 kg/m.  Physical Exam Constitutional:      General: He is not in acute distress.    Appearance: Normal appearance.  HENT:     Head: Normocephalic  and atraumatic.  Eyes:     Extraocular Movements: Extraocular movements intact.     Conjunctiva/sclera: Conjunctivae normal.  Abdominal:     General: There is no distension.     Palpations: Abdomen is soft.     Tenderness: There is no abdominal tenderness.  Musculoskeletal:        General: Normal range of motion.     Cervical back: Normal range of motion and neck supple.  Skin:    General: Skin is warm and dry.     Findings: No rash.  Neurological:     General: No focal deficit present.     Mental Status: He is alert and oriented to person, place, and time.  Psychiatric:        Mood and Affect: Mood normal.        Behavior: Behavior normal.     Lab Results: Lab Results  Component Value Date   WBC 5.6 01/02/2022   HGB 7.8 (L) 01/02/2022   HCT 24.7 (L) 01/02/2022   MCV 89.5 01/02/2022   PLT 122 (L) 01/02/2022    Lab Results  Component Value Date   NA 135 01/02/2022   K 4.3 01/02/2022   CO2 24 01/02/2022   GLUCOSE 95 01/02/2022   BUN 40 (H) 01/02/2022   CREATININE 2.70 (H) 01/02/2022   CALCIUM 8.4 (L) 01/02/2022   GFRNONAA 26 (L) 01/02/2022   GFRAA >60 07/14/2019    Lab Results  Component Value Date   ALT 35 12/29/2021   AST 18 12/29/2021   ALKPHOS 157 (H) 12/29/2021   BILITOT 0.7 12/29/2021    No results found for: CRP     Component Value Date/Time   ESRSEDRATE 19 02/08/2018 0504     I have reviewed the micro and lab results in Epic.  Imaging: DG OR UROLOGY CYSTO IMAGE ([facility] ONLY)  Result Date: 01/02/2022 There is no interpretation for this exam.  This order is for images obtained during a surgical procedure.  Please See "Surgeries" Tab for more information regarding the procedure.   ECHOCARDIOGRAM COMPLETE  Result Date: 01/01/2022    ECHOCARDIOGRAM REPORT   Patient Name:   [facility] Date of Exam: 01/01/2022 Medical Rec #:  [id number]  Height:       69.0 in Accession #:    [id number] Weight:       201.6 lb Date of Birth:  12/16/1955 BSA:          2.073  m Patient Age:    78 years   BP:           108/77 mmHg Patient Gender: M          HR:           90 bpm. Exam Location:  [facility] Procedure: 2D Echo, Color Doppler and Cardiac Doppler Indications:     Bacteremia R78.81  History:         Patient has no prior history of Echocardiogram examinations.                  COPD; Risk Factors:Hypertension.  Sonographer:     Sherrie Sport Referring Phys:  [id number] Sidney Ace Diagnosing Phys: Isaias Cowman MD  Sonographer Comments: Suboptimal apical window. IMPRESSIONS  1. Left ventricular ejection fraction, by estimation, is 60 to 65%. The left ventricle has normal function. The left ventricle has no regional wall motion abnormalities. Left ventricular diastolic parameters are indeterminate.  2. Right ventricular systolic function is normal. The right ventricular size is normal.  3. The mitral valve is normal in structure. Mild to moderate mitral valve regurgitation. No evidence of mitral stenosis.  4. The aortic valve is normal in structure. Aortic valve regurgitation is not visualized. No aortic stenosis is present.  5. The inferior vena cava is normal in size with greater than 50% respiratory variability, suggesting right atrial pressure of 3 mmHg. Conclusion(s)/Recommendation(s): No evidence of valvular vegetations on this transthoracic echocardiogram. Consider a transesophageal echocardiogram to exclude infective endocarditis if clinically indicated. FINDINGS  Left Ventricle: Left ventricular ejection fraction, by estimation, is 60 to 65%. The left ventricle has normal function. The left ventricle has no regional wall motion abnormalities. The left ventricular internal cavity size was normal in size. There is  no left ventricular hypertrophy. Left ventricular diastolic parameters are indeterminate. Right Ventricle: The right ventricular size is normal. No increase in right ventricular wall thickness. Right ventricular systolic function is normal. Left Atrium: Left  atrial size was normal in size. Right Atrium: Right atrial size was normal in size. Pericardium: There is no evidence of pericardial effusion. Mitral Valve: The mitral valve is normal in structure. Mild to moderate mitral valve regurgitation. No evidence of mitral valve stenosis. MV peak gradient, 6.0 mmHg. The mean mitral valve gradient is 2.0 mmHg. Tricuspid Valve: The tricuspid valve is normal in structure. Tricuspid valve regurgitation is mild . No evidence of tricuspid stenosis. Aortic Valve: The aortic valve is normal in structure. Aortic valve regurgitation is not visualized. No aortic stenosis is present. Aortic valve mean gradient measures 2.5 mmHg. Aortic valve peak gradient measures 4.1 mmHg. Aortic valve area, by VTI measures 2.60 cm. Pulmonic Valve: The pulmonic valve was normal in structure. Pulmonic valve regurgitation is not visualized. No evidence of pulmonic stenosis. Aorta: The aortic root is normal in size and structure. Venous: The inferior vena cava is normal in size with greater than 50% respiratory variability, suggesting right atrial pressure of 3 mmHg. IAS/Shunts: No atrial level shunt detected by color flow Doppler.  LEFT VENTRICLE PLAX 2D LVIDd:         4.80 cm LVIDs:         3.30 cm LV PW:         1.20 cm LV IVS:        1.70 cm LVOT diam:     2.00 cm LV SV:         46 LV SV Index:   22 LVOT Area:     3.14 cm  RIGHT VENTRICLE RV S prime:     11.70 cm/s TAPSE (M-mode): 1.6 cm LEFT ATRIUM            Index        RIGHT ATRIUM           Index LA diam:      4.70 cm  2.27 cm/m  RA Area:     13.10 cm LA Vol (A2C): 61.3 ml  29.57 ml/m  RA Volume:   29.30 ml  14.13 ml/m LA Vol (A4C): 105.0 ml 50.65 ml/m  AORTIC VALVE                    PULMONIC VALVE AV Area (Vmax):    2.94 cm     PV Vmax:        0.65 m/s AV Area (Vmean):   2.87 cm     PV Vmean:       48.450 cm/s AV Area (VTI):     2.60 cm     PV VTI:         0.130 m AV Vmax:           101.00 cm/s  PV Peak grad:   1.7 mmHg AV Vmean:           73.150 cm/s  PV Mean grad:   1.0 mmHg AV VTI:            0.179 m      RVOT Peak grad: 3 mmHg AV Peak Grad:      4.1 mmHg AV Mean Grad:      2.5 mmHg LVOT Vmax:         94.60 cm/s LVOT Vmean:        66.800 cm/s LVOT VTI:          0.148 m LVOT/AV VTI ratio: 0.83  AORTA Ao Root diam: 3.33 cm MITRAL VALVE                TRICUSPID VALVE MV Area (PHT): 6.02 cm     TR Peak grad:   6.4 mmHg MV Area VTI:   1.49 cm     TR Vmax:        126.00 cm/s MV Peak grad:  6.0 mmHg MV Mean grad:  2.0 mmHg     SHUNTS MV Vmax:       1.22 m/s     Systemic VTI:  0.15 m MV Vmean:      59.4 cm/s    Systemic Diam: 2.00 cm MV Decel Time: 126 msec     Pulmonic VTI:  0.159 m MV E velocity: 131.00 cm/s Isaias Cowman MD Electronically signed by Isaias Cowman MD Signature Date/Time: 01/01/2022/1:13:17 PM    Final    ECHO TEE  Result Date: 01/02/2022    TRANSESOPHOGEAL ECHO REPORT   Patient Name:   [facility] Date of Exam: 01/02/2022 Medical Rec #:  [id number]  Height:       69.0 in Accession #:    [id number] Weight:       201.6 lb Date of Birth:  [birth year] BSA:          2.073 m Patient Age:    10 years   BP:           116/94 mmHg Patient Gender: M          HR:           84 bpm. Exam Location:  [facility] Procedure: Transesophageal Echo, Cardiac Doppler, Color Doppler and [place]ine            Contrast Bubble Study Indications:     Not listed on TEE check-in sheet  History:         Patient has prior history of Echocardiogram examinations, most  recent 01/01/2022. COPD; Risk Factors:Hypertension.  Sonographer:     Sherrie Sport Referring Phys:  [id number] Fidelis TANG Diagnosing Phys: Serafina Royals MD PROCEDURE: The transesophogeal probe was passed without difficulty through the esophogus of the patient. Sedation performed by performing physician. The patient developed no complications during the procedure. IMPRESSIONS  1. Left ventricular ejection fraction, by estimation, is 50 to 55%. The left ventricle has low normal  function. The left ventricle has no regional wall motion abnormalities.  2. Right ventricular systolic function is normal. The right ventricular size is normal.  3. No left atrial/left atrial appendage thrombus was detected.  4. The mitral valve is normal in structure. Mild mitral valve regurgitation.  5. The aortic valve is normal in structure. Aortic valve regurgitation is trivial.  6. There is Moderate (Grade III) plaque.  7. Agitated saline contrast bubble study was negative, with no evidence of any interatrial shunt. FINDINGS  Left Ventricle: Left ventricular ejection fraction, by estimation, is 50 to 55%. The left ventricle has low normal function. The left ventricle has no regional wall motion abnormalities. The left ventricular internal cavity size was normal in size. Right Ventricle: The right ventricular size is normal. No increase in right ventricular wall thickness. Right ventricular systolic function is normal. Left Atrium: Left atrial size was normal in size. Spontaneous echo contrast was present. No left atrial/left atrial appendage thrombus was detected. Right Atrium: Right atrial size was normal in size. Pericardium: There is no evidence of pericardial effusion. Mitral Valve: The mitral valve is normal in structure. Mild mitral valve regurgitation. There is no evidence of mitral valve vegetation. Tricuspid Valve: The tricuspid valve is normal in structure. Tricuspid valve regurgitation is mild. There is no evidence of tricuspid valve vegetation. Aortic Valve: The aortic valve is normal in structure. Aortic valve regurgitation is trivial. There is no evidence of aortic valve vegetation. Pulmonic Valve: The pulmonic valve was normal in structure. Pulmonic valve regurgitation is trivial. There is no evidence of pulmonic valve vegetation. Aorta: The aortic root and ascending aorta are structurally normal, with no evidence of dilitation. There is moderate (Grade III) plaque. IAS/Shunts: No atrial level  shunt detected by color flow Doppler. Agitated saline contrast was given intravenously to evaluate for intracardiac shunting. Agitated saline contrast bubble study was negative, with no evidence of any interatrial shunt. There  is no evidence of a patent foramen ovale. There is no evidence of an atrial septal defect. Serafina Royals MD Electronically signed by Serafina Royals MD Signature Date/Time: 01/02/2022/8:53:08 AM    Final      Imaging independently reviewed in Epic.    Raynelle [facility] [phone] pager 01/02/2022, 4:24 PM

## 2022-01-02 NOTE — Progress Notes (Addendum)
[place] NOTE       Patient ID: Wayne Burns MRN: [id number] DOB/AGE: [birth year] 66 y.o.  Admit date: 12/26/2021 Referring Physician Dr. Shelly Coss  Primary Physician Dr. Earlie Counts [facility]  Primary Cardiologist  Reason for Consultation AF RVR  HPI: The patient is a 66 year old male with a past medical history notable for hypertension, COPD who was brought in by EMS under IVC by his family members for not being able to take care of himself.  He was apparently living in very poor conditions, refusing to go to the hospital and was deemed a danger to himself. He was hospitalized at [facility] for 10 days and discharged on 2/17 after bilateral ureteral stent placements and cardiology f/u was recommended for discussion of complex PCI placement as there was concern for inferior STE in the setting of CAD, sepsis, renal and respiratory failure. On arrival to [facility] ED 2/21 patient met sepsis criteria and in atrial fibrillation with RVR.  Cardiology is consulted for further assistance of his A-fib. Back in NSR on 2/28, on PO amiodarone.   Interval History:  -TEE did not show evidence of vegetation -underwent cystoscopy with stent replacement this morning with urology -review of telemetry this morning shows patient is in NSR with PVCs. -continues to deny chest pain, no SOB or other complaints.   Review of systems complete and found to be negative unless listed above    Past Medical History:  Diagnosis Date   COPD (chronic obstructive pulmonary disease) ([place])    Hypertension     Past Surgical History:  Procedure Laterality Date   ANKLE SURGERY     TEE WITHOUT CARDIOVERSION      Medications Prior to Admission  Medication Sig Dispense Refill Last Dose   amLODipine (NORVASC) 10 MG tablet Take 10 mg by mouth daily.      atorvastatin (LIPITOR) 80 MG tablet Take 80 mg by mouth daily.      cefdinir (OMNICEF) 300 MG capsule Take 300 mg by mouth once.      pantoprazole  (PROTONIX) 40 MG tablet Take 40 mg by mouth 2 (two) times daily.      sertraline (ZOLOFT) 100 MG tablet Take 100 mg by mouth 2 (two) times daily.      tamsulosin (FLOMAX) 0.4 MG CAPS capsule Take 0.4 mg by mouth daily.      tiotropium (SPIRIVA HANDIHALER) 18 MCG inhalation capsule Place 1 capsule into inhaler and inhale daily.      traZODone (DESYREL) 50 MG tablet Take 100 mg by mouth at bedtime.       Social History   Socioeconomic History   Marital status: Single    Spouse name: Not on file   Number of children: Not on file   Years of education: Not on file   Highest education level: Not on file  Occupational History   Not on file  Tobacco Use   Smoking status: Former    Packs/day: 0.50    Types: Cigarettes   Smokeless tobacco: Never  Vaping Use   Vaping Use: Never used  Substance and Sexual Activity   Alcohol use: Not Currently    Comment: often   Drug use: Not Currently   Sexual activity: Not on file  Other Topics Concern   Not on file  Social History Narrative   Not on file   Social Determinants of Health   Financial Resource Strain: Not on file  Food Insecurity: Not on file  Transportation Needs: Not on  file  Physical Activity: Not on file  Stress: Not on file  Social Connections: Not on file  Intimate Partner Violence: Not on file    History reviewed. No pertinent family history.    Review of systems complete and found to be negative unless listed above    PHYSICAL EXAM General: Disheveled Caucasian male, in no acute distress.  Sitting upright in PCU bed eating lunch wearing street clothes.  HEENT:  Normocephalic and atraumatic. Neck:  No JVD.  Lungs: Normal respiratory effort on room air. No crackles. Heart: regular rate and rhythm. Normal S1 and S2 without gallops or murmurs. Radial & DP pulses 2+ bilaterally. Abdomen: Obese appearing.  Msk: Normal strength and tone for age. Extremities: Warm and well perfused. No clubbing, cyanosis. No LEE.  Neuro:  Alert and oriented X 3. Psych:  Answers questions appropriately.   Labs:   Lab Results  Component Value Date   WBC 5.6 01/02/2022   HGB 7.8 (L) 01/02/2022   HCT 24.7 (L) 01/02/2022   MCV 89.5 01/02/2022   PLT 122 (L) 01/02/2022    Recent Labs  Lab 12/29/21 0716 12/30/21 0624 01/02/22 0639  NA 127*   < > 135  K 3.7   < > 4.3  CL 97*   < > 98  CO2 18*   < > 24  BUN 57*   < > 40*  CREATININE 3.34*   < > 2.70*  CALCIUM 7.4*   < > 8.4*  PROT 7.0  --   --   BILITOT 0.7  --   --   ALKPHOS 157*  --   --   ALT 35  --   --   AST 18  --   --   GLUCOSE 325*   < > 95   < > = values in this interval not displayed.    Lab Results  Component Value Date   CKTOTAL 10 (L) 12/26/2021    No results found for: CHOL No results found for: HDL No results found for: LDLCALC No results found for: TRIG No results found for: CHOLHDL No results found for: LDLDIRECT    Radiology: CT ABDOMEN PELVIS WO CONTRAST  Addendum Date: 12/26/2021   ADDENDUM REPORT: 12/26/2021 23:44 ADDENDUM: It should be noted that the air seen within the wall of the urinary bladder may represent sequelae associated with the previously described malpositioned right-sided endo ureteral stent. Electronically Signed   By: Virgina Norfolk M.D.   On: 12/26/2021 23:44   Result Date: 12/26/2021 CLINICAL DATA:  Patient with altered mentation and recent history of COVID positive. EXAM: CT ABDOMEN AND PELVIS WITHOUT CONTRAST TECHNIQUE: Multidetector CT imaging of the abdomen and pelvis was performed following the standard protocol without IV contrast. RADIATION DOSE REDUCTION: This exam was performed according to the departmental dose-optimization program which includes automated exposure control, adjustment of the mA and/or kV according to patient size and/or use of iterative reconstruction technique. COMPARISON:  July 14, 2019 FINDINGS: Lower chest: No acute abnormality. Hepatobiliary: No focal liver abnormality is seen. No  gallstones, gallbladder wall thickening, or biliary dilatation. Pancreas: Unremarkable. No pancreatic ductal dilatation or surrounding inflammatory changes. Spleen: There is mild to moderate severity splenomegaly. Adrenals/Urinary Tract: Adrenal glands are unremarkable. Kidneys are normal in size, without focal lesions. Bilateral endo ureteral stents are noted. The proximal portion of the right-sided endo ureteral stent is seen inferior to the right renal pelvis (coronal reformatted images 46 through 50, CT series 5), while the left-sided endo  ureteral stent is properly positioned. Ill-defined subcentimeter renal calculi are seen within the right renal pelvis which is mildly dilated. Numerous subcentimeter non-obstructing renal calculi are seen within the left kidney. There is marked severity diffuse urinary bladder wall thickening with air suspected within the lateral aspect of the bladder wall on the right. A Foley catheter is also present within the bladder lumen. Stomach/Bowel: Stomach is within normal limits. Appendix appears normal. No evidence of bowel wall thickening, distention, or inflammatory changes. Vascular/Lymphatic: Aortic atherosclerosis. No enlarged abdominal or pelvic lymph nodes. Reproductive: The prostate gland is remarkable. Other: No abdominal wall hernia or abnormality. No abdominopelvic ascites. Musculoskeletal: Multilevel degenerative changes seen throughout the lumbar spine. IMPRESSION: 1. Bilateral endo ureteral stents, with a malpositioned right-sided endo ureteral stent, as described above. Surgical an urology consultation is recommended. 2. Marked severity diffuse urinary bladder wall thickening with air suspected within the lateral aspect of the bladder wall on the right which may represent sequelae associated with cystitis. The presence of an underlying neoplastic process cannot be excluded. 3. Renal calculi within the right renal pelvis with numerous subcentimeter non-obstructing  left renal calculi. 4. Splenomegaly. 5. Aortic atherosclerosis. Aortic Atherosclerosis (ICD10-I70.0). Electronically Signed: By: Virgina Norfolk M.D. On: 12/26/2021 23:40   DG Chest Portable 1 View  Result Date: 12/26/2021 CLINICAL DATA:  New atrial fibrillation. EXAM: PORTABLE CHEST 1 VIEW COMPARISON:  Chest radiograph 10/14/2019 FINDINGS: Stable heart size allowing for differences in technique. Unchanged mediastinal contours. Aortic atherosclerosis and coronary stent. Mild hyperinflation and bronchial thickening, chronic. No acute airspace disease. No pulmonary edema. No pleural effusion or pneumothorax no acute osseous findings. IMPRESSION: 1. No acute chest findings. 2. Mild chronic hyperinflation and bronchial thickening. 3. Aortic atherosclerosis and coronary stent. Electronically Signed   By: Keith Rake M.D.   On: 12/26/2021 20:46   DG OR UROLOGY CYSTO IMAGE ([facility] ONLY)  Result Date: 01/02/2022 There is no interpretation for this exam.  This order is for images obtained during a surgical procedure.  Please See "Surgeries" Tab for more information regarding the procedure.   ECHOCARDIOGRAM COMPLETE  Result Date: 01/01/2022    ECHOCARDIOGRAM REPORT   Patient Name:   [facility] Date of Exam: 01/01/2022 Medical Rec #:  [id number]  Height:       69.0 in Accession #:    [id number] Weight:       201.6 lb Date of Birth:  05/15/1956 BSA:          2.073 m Patient Age:    38 years   BP:           108/77 mmHg Patient Gender: M          HR:           90 bpm. Exam Location:  [facility] Procedure: 2D Echo, Color Doppler and Cardiac Doppler Indications:     Bacteremia R78.81  History:         Patient has no prior history of Echocardiogram examinations.                  COPD; Risk Factors:Hypertension.  Sonographer:     Sherrie Sport Referring Phys:  [id number] Sidney Ace Diagnosing Phys: Isaias Cowman MD  Sonographer Comments: Suboptimal apical window. IMPRESSIONS  1. Left ventricular ejection fraction,  by estimation, is 60 to 65%. The left ventricle has normal function. The left ventricle has no regional wall motion abnormalities. Left ventricular diastolic parameters are indeterminate.  2. Right ventricular systolic function is normal. The  right ventricular size is normal.  3. The mitral valve is normal in structure. Mild to moderate mitral valve regurgitation. No evidence of mitral stenosis.  4. The aortic valve is normal in structure. Aortic valve regurgitation is not visualized. No aortic stenosis is present.  5. The inferior vena cava is normal in size with greater than 50% respiratory variability, suggesting right atrial pressure of 3 mmHg. Conclusion(s)/Recommendation(s): No evidence of valvular vegetations on this transthoracic echocardiogram. Consider a transesophageal echocardiogram to exclude infective endocarditis if clinically indicated. FINDINGS  Left Ventricle: Left ventricular ejection fraction, by estimation, is 60 to 65%. The left ventricle has normal function. The left ventricle has no regional wall motion abnormalities. The left ventricular internal cavity size was normal in size. There is  no left ventricular hypertrophy. Left ventricular diastolic parameters are indeterminate. Right Ventricle: The right ventricular size is normal. No increase in right ventricular wall thickness. Right ventricular systolic function is normal. Left Atrium: Left atrial size was normal in size. Right Atrium: Right atrial size was normal in size. Pericardium: There is no evidence of pericardial effusion. Mitral Valve: The mitral valve is normal in structure. Mild to moderate mitral valve regurgitation. No evidence of mitral valve stenosis. MV peak gradient, 6.0 mmHg. The mean mitral valve gradient is 2.0 mmHg. Tricuspid Valve: The tricuspid valve is normal in structure. Tricuspid valve regurgitation is mild . No evidence of tricuspid stenosis. Aortic Valve: The aortic valve is normal in structure. Aortic valve  regurgitation is not visualized. No aortic stenosis is present. Aortic valve mean gradient measures 2.5 mmHg. Aortic valve peak gradient measures 4.1 mmHg. Aortic valve area, by VTI measures 2.60 cm. Pulmonic Valve: The pulmonic valve was normal in structure. Pulmonic valve regurgitation is not visualized. No evidence of pulmonic stenosis. Aorta: The aortic root is normal in size and structure. Venous: The inferior vena cava is normal in size with greater than 50% respiratory variability, suggesting right atrial pressure of 3 mmHg. IAS/Shunts: No atrial level shunt detected by color flow Doppler.  LEFT VENTRICLE PLAX 2D LVIDd:         4.80 cm LVIDs:         3.30 cm LV PW:         1.20 cm LV IVS:        1.70 cm LVOT diam:     2.00 cm LV SV:         46 LV SV Index:   22 LVOT Area:     3.14 cm  RIGHT VENTRICLE RV S prime:     11.70 cm/s TAPSE (M-mode): 1.6 cm LEFT ATRIUM            Index        RIGHT ATRIUM           Index LA diam:      4.70 cm  2.27 cm/m   RA Area:     13.10 cm LA Vol (A2C): 61.3 ml  29.57 ml/m  RA Volume:   29.30 ml  14.13 ml/m LA Vol (A4C): 105.0 ml 50.65 ml/m  AORTIC VALVE                    PULMONIC VALVE AV Area (Vmax):    2.94 cm     PV Vmax:        0.65 m/s AV Area (Vmean):   2.87 cm     PV Vmean:       48.450 cm/s AV Area (VTI):  2.60 cm     PV VTI:         0.130 m AV Vmax:           101.00 cm/s  PV Peak grad:   1.7 mmHg AV Vmean:          73.150 cm/s  PV Mean grad:   1.0 mmHg AV VTI:            0.179 m      RVOT Peak grad: 3 mmHg AV Peak Grad:      4.1 mmHg AV Mean Grad:      2.5 mmHg LVOT Vmax:         94.60 cm/s LVOT Vmean:        66.800 cm/s LVOT VTI:          0.148 m LVOT/AV VTI ratio: 0.83  AORTA Ao Root diam: 3.33 cm MITRAL VALVE                TRICUSPID VALVE MV Area (PHT): 6.02 cm     TR Peak grad:   6.4 mmHg MV Area VTI:   1.49 cm     TR Vmax:        126.00 cm/s MV Peak grad:  6.0 mmHg MV Mean grad:  2.0 mmHg     SHUNTS MV Vmax:       1.22 m/s     Systemic VTI:  0.15 m  MV Vmean:      59.4 cm/s    Systemic Diam: 2.00 cm MV Decel Time: 126 msec     Pulmonic VTI:  0.159 m MV E velocity: 131.00 cm/s Isaias Cowman MD Electronically signed by Isaias Cowman MD Signature Date/Time: 01/01/2022/1:13:17 PM    Final    ECHO TEE  Result Date: 01/02/2022    TRANSESOPHOGEAL ECHO REPORT   Patient Name:   [facility] Date of Exam: 01/02/2022 Medical Rec #:  [id number]  Height:       69.0 in Accession #:    [id number] Weight:       201.6 lb Date of Birth:  May 22, 1956 BSA:          2.073 m Patient Age:    24 years   BP:           116/94 mmHg Patient Gender: M          HR:           84 bpm. Exam Location:  [facility] Procedure: Transesophageal Echo, Cardiac Doppler, Color Doppler and Saline            Contrast Bubble Study Indications:     Not listed on TEE check-in sheet  History:         Patient has prior history of Echocardiogram examinations, most                  recent 01/01/2022. COPD; Risk Factors:Hypertension.  Sonographer:     Sherrie Sport Referring Phys:  [id number] [place] Taima Rada Diagnosing Phys: Serafina Royals MD PROCEDURE: The transesophogeal probe was passed without difficulty through the esophogus of the patient. Sedation performed by performing physician. The patient developed no complications during the procedure. IMPRESSIONS  1. Left ventricular ejection fraction, by estimation, is 50 to 55%. The left ventricle has low normal function. The left ventricle has no regional wall motion abnormalities.  2. Right ventricular systolic function is normal. The right ventricular size is normal.  3. No left atrial/left atrial appendage thrombus was detected.  4.  The mitral valve is normal in structure. Mild mitral valve regurgitation.  5. The aortic valve is normal in structure. Aortic valve regurgitation is trivial.  6. There is Moderate (Grade III) plaque.  7. Agitated saline contrast bubble study was negative, with no evidence of any interatrial shunt. FINDINGS  Left Ventricle: Left  ventricular ejection fraction, by estimation, is 50 to 55%. The left ventricle has low normal function. The left ventricle has no regional wall motion abnormalities. The left ventricular internal cavity size was normal in size. Right Ventricle: The right ventricular size is normal. No increase in right ventricular wall thickness. Right ventricular systolic function is normal. Left Atrium: Left atrial size was normal in size. Spontaneous echo contrast was present. No left atrial/left atrial appendage thrombus was detected. Right Atrium: Right atrial size was normal in size. Pericardium: There is no evidence of pericardial effusion. Mitral Valve: The mitral valve is normal in structure. Mild mitral valve regurgitation. There is no evidence of mitral valve vegetation. Tricuspid Valve: The tricuspid valve is normal in structure. Tricuspid valve regurgitation is mild. There is no evidence of tricuspid valve vegetation. Aortic Valve: The aortic valve is normal in structure. Aortic valve regurgitation is trivial. There is no evidence of aortic valve vegetation. Pulmonic Valve: The pulmonic valve was normal in structure. Pulmonic valve regurgitation is trivial. There is no evidence of pulmonic valve vegetation. Aorta: The aortic root and ascending aorta are structurally normal, with no evidence of dilitation. There is moderate (Grade III) plaque. IAS/Shunts: No atrial level shunt detected by color flow Doppler. Agitated saline contrast was given intravenously to evaluate for intracardiac shunting. Agitated saline contrast bubble study was negative, with no evidence of any interatrial shunt. There  is no evidence of a patent foramen ovale. There is no evidence of an atrial septal defect. Serafina Royals MD Electronically signed by Serafina Royals MD Signature Date/Time: 01/02/2022/8:53:08 AM    Final     ECHO 12/12/21 Summary    1. An ultrasound enhancing agent was used to improve the visualization of  the left ventricular  cavity and endocardial borders.    2. Technically difficult study.    3. The left ventricle is upper normal in size with normal wall thickness.    4. The left ventricular systolic function is severely decreased, LVEF is  visually estimated at 25-30%.    5. There is thinning and akinesis involving the mid inferior and basal  inferior segment(s).    6. There is grade III diastolic dysfunction (severely elevated filling  pressure).    7. The right ventricle is normal in size, with low normal systolic function.   TELEMETRY reviewed by me: since in the PCU, has been in NSR with rate in the 80s with PVCs  EKG reviewed by me: Atrial fibrillation rate 121, old inferior infarct  Cardiac cath at [facility] 12/13/21 Coronary Angiography   Dominance: Right   Left Coronary:    - Left Main:  Large caliber vessel that bifurcates into the LAD and LCx.  There is 25% distal stenosis.     LAD:  - Heavily calcified, large caliber vessel that is subtotally occluded at  the early-mid vessel just after the take-off of the first diagonal. D1 is  a moderate-large caliber and provides L>L collaterals to the OM territory.  D1 has moderate proximal and early mid-vessel disease. The first septal  provides L>R collaterals to the PLA and PLV.   LCx:  - Moderate caliber vessel that is chronically occluded in  the proximal  vessel. OM1 is long and of small caliber, without significant disease. A  mid/distal portion of a high OM-2 vessel fills via L>L collaterals. The  mid/distal OM appears to have significant disease.   Right Coronary:    - Dominant vessel that is chronically totally occluded at the proximal  vessel. The proximal vessel The distal vessel/PDA/PLV fills with L>R  collaterals.   Complications:  None  Blood loss:  Minimal  Specimen:  None  Device/Implants:  NA   Pre-procedure Dx:  STEMI  Post-procedure Dx:  CAD   ASSESSMENT AND PLAN:  The patient is a 66 year old male with a past medical history  notable for CAD, HFrEF (LVEF 25-30%), hypertension, COPD who was brought in by EMS under IVC by his family members for not being able to take care of himself.  He was apparently living in very poor conditions, refusing to go to the hospital and was deemed a danger to himself. He was hospitalized at [facility] for 10 days and discharged on 2/17 after bilateral ureteral stent placements and cardiology f/u was recommended for discussion of complex PCI placement as there was concern for inferior STE in the setting of CAD, sepsis, renal and respiratory failure. On arrival to [facility] ED 2/21 patient met sepsis criteria and in atrial fibrillation with RVR.  Cardiology is consulted for further assistance of his A-fib.  #New onset atrial fibrillation with RVR, converted to NSR 2/28 on PO amiodarone #ESBL urosepsis & candida fungemia  #septic shock #COVID-19, asymptomatic Patient presents to [facility] ED after a 10-day hospitalization at [facility] for treatment of kidney stones. He presents to [facility] ED 4 days after discharge "feeling horrible" and met sepsis criteria on admission.  He was also in A-fib with rates peaking in the 140s, blood pressure too soft to start diltiazem or metoprolol.  His troponin is minimally elevated to 67-70 thought to be 2/2 demand ischemia from sepsis and his tachycardia.   -Agree with treatment primary infection as per primary team -on midodrine for hypotension, wean as tolerated per primary. Off neo since 2/25. -s/p Amio load and gtt. Continue PO amiodarone 245m BID x 7 days and then 2068monce daily therafter.  -back in NSR on 2/28. Ordered repeat EKG. 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  Remains chest pain-free.   -[facility] now [facility] in care everywhere -- it resulted with 25% distal LM disease, subtotally occluded mid-LAD at the stent level, chronically occluded mid-Lcx and occluded proximal RCA  --intervention deferred aggressive secondary prevention recommended  -continue 8112mSA daily -continue atorvastatin 12m85mOn amlodipine at home, holding due to hypotension. Will likely transition from amlodipine to ACE/ARB for GDMT as his BP tolerates.  -would like to reestablish care with Dr. CallClayborn Bigness will need follow up 1-2 weeks after discharge.  #HFimprovedEF (LVEF 25-30% 12/13/21 --> 60-65% on 01/01/22)  -appears euvolemic from a HF standpoint -not  on GDMT currently d/t hypotension -recommend salt and fluid restriction  #Acute kidney injury Renal function gradually improving. Cr. 2.7 and GFR 26 today.  Appreciate nephrology input  This patient's plan of care was discussed and created with Dr. Saralyn Pilar and he is in agreement.  Signed: Tristan Schroeder , PA-C 01/02/2022, 1:58 PM [facility]

## 2022-01-02 NOTE — Progress Notes (Signed)
*  PRELIMINARY RESULTS* Echocardiogram Echocardiogram Transesophageal has been performed.  Wayne Burns 01/02/2022, 8:47 AM

## 2022-01-02 NOTE — Transfer of Care (Signed)
Immediate Anesthesia Transfer of Care Note  Patient: Wayne Burns  Procedure(s) Performed: CYSTOSCOPY WITH STENT REPLACEMENT (Right)  Patient Location: PACU  Anesthesia Type:MAC  Level of Consciousness: sedated  Airway & Oxygen Therapy: Patient Spontanous Breathing and Patient connected to face mask oxygen  Post-op Assessment: Report given to RN and Post -op Vital signs reviewed and stable  Post vital signs: Reviewed and stable  Last Vitals:  Vitals Value Taken Time  BP 112/79 01/02/22 1211  Temp    Pulse 87 01/02/22 1215  Resp 18 01/02/22 1215  SpO2 100 % 01/02/22 1215  Vitals shown include unvalidated device data.  Last Pain:  Vitals:   01/02/22 1118  TempSrc: Oral  PainSc: 0-No pain      Patients Stated Pain Goal: 2 (66/59/93 5701)  Complications: No notable events documented.

## 2022-01-02 NOTE — Anesthesia Preprocedure Evaluation (Signed)
Anesthesia Evaluation  Patient identified by MRN, date of birth, ID band Patient awake    Reviewed: Allergy & Precautions, H&P , NPO status , Patient's Chart, lab work & pertinent test results, reviewed documented beta blocker date and time   History of Anesthesia Complications Negative for: history of anesthetic complications  Airway Mallampati: II  TM Distance: >3 FB Neck ROM: full    Dental  (+) Dental Advidsory Given, Poor Dentition, Missing   Pulmonary neg shortness of breath, asthma , COPD,  COPD inhaler, neg recent URI, former smoker,    Pulmonary exam normal breath sounds clear to auscultation       Cardiovascular Exercise Tolerance: Good hypertension, (-) angina+ CAD and + Cardiac Stents  (-) Past MI + dysrhythmias Atrial Fibrillation (-) Valvular Problems/Murmurs Rhythm:regular Rate:Normal     Neuro/Psych PSYCHIATRIC DISORDERS Depression negative neurological ROS     GI/Hepatic GERD  ,(+) Hepatitis - (s/p treatment), C  Endo/Other  negative endocrine ROS  Renal/GU Renal disease (kidney stone)  negative genitourinary   Musculoskeletal   Abdominal   Peds  Hematology negative hematology ROS (+)   Anesthesia Other Findings Past Medical History: No date: COPD (chronic obstructive pulmonary disease) (HCC) No date: Hypertension   Reproductive/Obstetrics negative OB ROS                             Anesthesia Physical Anesthesia Plan  ASA: 3  Anesthesia Plan: General   Post-op Pain Management:    Induction: Intravenous  PONV Risk Score and Plan: 2 and Treatment may vary due to age or medical condition, Propofol infusion and TIVA  Airway Management Planned: Natural Airway and Simple Face Mask  Additional Equipment:   Intra-op Plan:   Post-operative Plan:   Informed Consent: I have reviewed the patients History and Physical, chart, labs and discussed the procedure  including the risks, benefits and alternatives for the proposed anesthesia with the patient or authorized representative who has indicated his/her understanding and acceptance.     Dental Advisory Given  Plan Discussed with: Anesthesiologist, CRNA and Surgeon  Anesthesia Plan Comments:         Anesthesia Quick Evaluation

## 2022-01-03 ENCOUNTER — Inpatient Hospital Stay: Payer: Self-pay

## 2022-01-03 DIAGNOSIS — T83122S Displacement of urinary stent, sequela: Secondary | ICD-10-CM | POA: Diagnosis not present

## 2022-01-03 DIAGNOSIS — A419 Sepsis, unspecified organism: Secondary | ICD-10-CM | POA: Diagnosis not present

## 2022-01-03 DIAGNOSIS — B379 Candidiasis, unspecified: Secondary | ICD-10-CM

## 2022-01-03 DIAGNOSIS — B9620 Unspecified Escherichia coli [E. coli] as the cause of diseases classified elsewhere: Secondary | ICD-10-CM

## 2022-01-03 DIAGNOSIS — I959 Hypotension, unspecified: Secondary | ICD-10-CM

## 2022-01-03 DIAGNOSIS — D638 Anemia in other chronic diseases classified elsewhere: Secondary | ICD-10-CM

## 2022-01-03 DIAGNOSIS — R7881 Bacteremia: Secondary | ICD-10-CM

## 2022-01-03 LAB — RENAL FUNCTION PANEL
Albumin: 2 g/dL — ABNORMAL LOW (ref 3.5–5.0)
Anion gap: 11 (ref 5–15)
BUN: 37 mg/dL — ABNORMAL HIGH (ref 8–23)
CO2: 25 mmol/L (ref 22–32)
Calcium: 8.1 mg/dL — ABNORMAL LOW (ref 8.9–10.3)
Chloride: 97 mmol/L — ABNORMAL LOW (ref 98–111)
Creatinine, Ser: 2.63 mg/dL — ABNORMAL HIGH (ref 0.61–1.24)
GFR, Estimated: 27 mL/min — ABNORMAL LOW (ref >60)
Glucose, Bld: 169 mg/dL — ABNORMAL HIGH (ref 70–99)
Phosphorus: 3.6 mg/dL (ref 2.5–4.6)
Potassium: 4.6 mmol/L (ref 3.5–5.1)
Sodium: 133 mmol/L — ABNORMAL LOW (ref 135–145)

## 2022-01-03 LAB — CBC WITH DIFFERENTIAL/PLATELET
Abs Immature Granulocytes: 0.05 10*3/uL (ref 0.00–0.07)
Basophils Absolute: 0 10*3/uL (ref 0.0–0.1)
Basophils Relative: 0 %
Eosinophils Absolute: 0.2 10*3/uL (ref 0.0–0.5)
Eosinophils Relative: 4 %
HCT: 22.2 % — ABNORMAL LOW (ref 39.0–52.0)
Hemoglobin: 7.1 g/dL — ABNORMAL LOW (ref 13.0–17.0)
Immature Granulocytes: 1 %
Lymphocytes Relative: 14 %
Lymphs Abs: 0.7 10*3/uL (ref 0.7–4.0)
MCH: 28.5 pg (ref 26.0–34.0)
MCHC: 32 g/dL (ref 30.0–36.0)
MCV: 89.2 fL (ref 80.0–100.0)
Monocytes Absolute: 0.2 10*3/uL (ref 0.1–1.0)
Monocytes Relative: 5 %
Neutro Abs: 3.7 10*3/uL (ref 1.7–7.7)
Neutrophils Relative %: 76 %
Platelets: 135 10*3/uL — ABNORMAL LOW (ref 150–400)
RBC: 2.49 MIL/uL — ABNORMAL LOW (ref 4.22–5.81)
RDW: 18.3 % — ABNORMAL HIGH (ref 11.5–15.5)
WBC: 4.8 10*3/uL (ref 4.0–10.5)
nRBC: 0 % (ref 0.0–0.2)

## 2022-01-03 LAB — GLUCOSE, CAPILLARY
Glucose-Capillary: 127 mg/dL — ABNORMAL HIGH (ref 70–99)
Glucose-Capillary: 136 mg/dL — ABNORMAL HIGH (ref 70–99)
Glucose-Capillary: 95 mg/dL (ref 70–99)
Glucose-Capillary: 130 mg/dL — ABNORMAL HIGH (ref 70–99)

## 2022-01-03 LAB — CULTURE, BLOOD (ROUTINE X 2)
Culture: NO GROWTH
Special Requests: ADEQUATE

## 2022-01-03 LAB — PREPARE RBC (CROSSMATCH)

## 2022-01-03 LAB — MAGNESIUM: Magnesium: 1.5 mg/dL — ABNORMAL LOW (ref 1.7–2.4)

## 2022-01-03 MED ORDER — AMIODARONE HCL 200 MG PO TABS
200.0000 mg | ORAL_TABLET | Freq: Every day | ORAL | Status: DC
  Administered 2022-01-06 – 2022-01-14 (×9): 200 mg via ORAL
  Filled 2022-01-03 (×9): qty 1

## 2022-01-03 MED ORDER — SODIUM CHLORIDE 0.9% IV SOLUTION
Freq: Once | INTRAVENOUS | Status: AC
  Administered 2022-01-03: 10 mL via INTRAVENOUS

## 2022-01-03 MED ORDER — FUROSEMIDE 10 MG/ML IJ SOLN: 20.0000 mg | Freq: Once | INTRAMUSCULAR | Status: DC

## 2022-01-03 MED ORDER — FUROSEMIDE 10 MG/ML IJ SOLN
40.0000 mg | Freq: Once | INTRAMUSCULAR | Status: AC
  Administered 2022-01-03: 40 mg via INTRAVENOUS
  Filled 2022-01-03: qty 4

## 2022-01-03 MED ORDER — SODIUM CHLORIDE 0.9 % IV SOLN
1.0000 g | INTRAVENOUS | Status: DC
  Administered 2022-01-04 – 2022-01-07 (×4): 1000 mg via INTRAVENOUS
  Filled 2022-01-03 (×4): qty 1

## 2022-01-03 MED ORDER — MAGNESIUM SULFATE 2 GM/50ML IV SOLN
2.0000 g | Freq: Once | INTRAVENOUS | Status: AC
Start: 2022-01-03 — End: 2022-01-04
  Administered 2022-01-03: 2 g via INTRAVENOUS
  Filled 2022-01-03: qty 50

## 2022-01-03 NOTE — Progress Notes (Signed)
Per MD it is okay  to leave I/O cath supplies for patient to self cath himself. Pt stated he has been doing it at home for long time. ?

## 2022-01-03 NOTE — Progress Notes (Signed)
[facility]  ROUNDING NOTE   Subjective:   Patient currently resting quietly Alert and oriented Reports mild pain, manageable Reports successful surgery yesterday.  Urinating blood-tinged urine States possible DC today  Urine output recorded at 3.35 L overnight  02/28 0701 - 03/01 0700 In: -  Out: 2351 [Urine:2350; Blood:1] Lab Results  Component Value Date   CREATININE 2.63 (H) 01/03/2022   CREATININE 2.70 (H) 01/02/2022   CREATININE 2.78 (H) 01/01/2022     Objective:  Vital signs in last 24 hours:  Temp:  [97.7 F (36.5 C)-98.2 F (36.8 C)] 98.2 F (36.8 C) (03/01 1143) Pulse Rate:  [81-93] 81 (03/01 1143) Resp:  [18-19] 19 (03/01 1200) BP: (95-115)/(57-74) 108/59 (03/01 1143) SpO2:  [98 %-100 %] 99 % (03/01 1143)  Weight change:  Filed Weights   12/28/21 1500 12/31/21 1741 01/02/22 1118  Weight: 92.6 kg 91.4 kg 91.4 kg    Intake/Output: I/O last 3 completed shifts: In: 679.8 [P.O.:450; IV Piggyback:229.8] Out: 4101 [Urine:4100; Blood:1]   Intake/Output this shift:  Total I/O In: 1150.3 [P.O.:720; IV Piggyback:430.3] Out: 1800 [Urine:1800]  Physical Exam: General: NAD  Head: Normocephalic, atraumatic. Moist oral mucosal membranes  Eyes: Anicteric  Lungs:  Normal effort, clear bilaterally  Heart: Regular rate and rhythm  Abdomen:  Soft, nontender, obese  Extremities: No peripheral edema.  Neurologic: Nonfocal, moving all four extremities  Skin: No acute skin rash       Basic Metabolic Panel: Recent Labs  Lab 12/30/21 4481 12/31/21 0308 12/31/21 0631 01/01/22 0529 01/01/22 0530 01/01/22 1714 01/02/22 0639 01/03/22 0637  NA 131* 130* 132*  --  134* 132* 135 133*  K 3.6 3.4* 3.6  --  3.9 4.0 4.3 4.6  CL 96* 93* 94*  --  95* 95* 98 97*  CO2 23 27 28   --  26 28 24 25   GLUCOSE 112* 118* 98  --  95 120* 95 169*  BUN 59* 53* 54*  --  48* 46* 40* 37*  CREATININE 3.18* 2.88* 2.91*  --  2.90* 2.78* 2.70* 2.63*  CALCIUM 7.7* 7.5*  7.7*  --  8.2* 7.9* 8.4* 8.1*  MG 2.0 1.9  --  1.8  --   --  1.8 1.5*  PHOS 3.7 3.9  --   --  4.3  --  3.7 3.6     Liver Function Tests: Recent Labs  Lab 12/29/21 0716 12/30/21 0624 12/31/21 0308 01/01/22 0530 01/02/22 0639 01/03/22 0637  AST 18  --   --   --   --   --   ALT 35  --   --   --   --   --   ALKPHOS 157*  --   --   --   --   --   BILITOT 0.7  --   --   --   --   --   PROT 7.0  --   --   --   --   --   ALBUMIN 2.3* 2.1* 2.0* 2.2* 2.3* 2.0*    No results for input(s): LIPASE, AMYLASE in the last 168 hours. No results for input(s): AMMONIA in the last 168 hours.  CBC: Recent Labs  Lab 12/30/21 0624 12/30/21 0756 12/31/21 0308 12/31/21 0631 01/01/22 0529 01/01/22 1828 01/02/22 0639 01/03/22 0637  WBC 6.9  --  6.5 7.9 6.4  --  5.6 4.8  NEUTROABS 5.8  --  5.2  --  4.9  --  3.8 3.7  HGB  6.8*   < > 6.2* 7.0* 6.7* 7.4* 7.8* 7.1*  HCT 20.5*   < > 19.1* 21.9* 21.2* 22.7* 24.7* 22.2*  MCV 84.4  --  86.0 86.2 88.0  --  89.5 89.2  PLT 127*  --  114* 128* 128*  --  122* 135*   < > = values in this interval not displayed.     Cardiac Enzymes: No results for input(s): CKTOTAL, CKMB, CKMBINDEX, TROPONINI in the last 168 hours.   BNP: Invalid input(s): POCBNP  CBG: Recent Labs  Lab 01/02/22 1254 01/02/22 1551 01/02/22 2048 01/03/22 0821 01/03/22 1144  GLUCAP 88 153* 185* 127* 136*     Microbiology: Results for orders placed or performed during the hospital encounter of 12/26/21  Resp Panel by RT-PCR (Flu A&B, Covid)     Status: Abnormal   Collection Time: 12/26/21 10:33 PM   Specimen: Nasopharyngeal(NP) swabs in vial transport medium  Result Value Ref Range Status   SARS Coronavirus 2 by RT PCR POSITIVE (A) NEGATIVE Final    Comment: (NOTE) SARS-CoV-2 target nucleic acids are DETECTED.  The SARS-CoV-2 RNA is generally detectable in upper respiratory specimens during the acute phase of infection. Positive results are indicative of the presence of  the identified virus, but do not rule out bacterial infection or co-infection with other pathogens not detected by the test. Clinical correlation with patient history and other diagnostic information is necessary to determine patient infection status. The expected result is Negative.  Fact Sheet for Patients: EntrepreneurPulse.com.au  Fact Sheet for Healthcare Providers: IncredibleEmployment.be  This test is not yet approved or cleared by the [place] FDA and  has been authorized for detection and/or diagnosis of SARS-CoV-2 by FDA under an Emergency Use Authorization (EUA).  This EUA will remain in effect (meaning this test can be used) for the duration of  the COVID-19 declaration under Section 564(b)(1) of the A ct, 21 U.S.C. section 360bbb-3(b)(1), unless the authorization is terminated or revoked sooner.     Influenza A by PCR NEGATIVE NEGATIVE Final   Influenza B by PCR NEGATIVE NEGATIVE Final    Comment: (NOTE) The Xpert Xpress SARS-CoV-2/FLU/RSV plus assay is intended as an aid in the diagnosis of influenza from Nasopharyngeal swab specimens and should not be used as a sole basis for treatment. Nasal washings and aspirates are unacceptable for Xpert Xpress SARS-CoV-2/FLU/RSV testing.  Fact Sheet for Patients: EntrepreneurPulse.com.au  Fact Sheet for Healthcare Providers: IncredibleEmployment.be  This test is not yet approved or cleared by the [place] FDA and has been authorized for detection and/or diagnosis of SARS-CoV-2 by FDA under an Emergency Use Authorization (EUA). This EUA will remain in effect (meaning this test can be used) for the duration of the COVID-19 declaration under Section 564(b)(1) of the Act, 21 U.S.C. section 360bbb-3(b)(1), unless the authorization is terminated or revoked.  Performed at [facility], [address] [place], [place] [address]   Blood  culture (routine x 2)     Status: None   Collection Time: 12/26/21 10:33 PM   Specimen: BLOOD  Result Value Ref Range Status   Specimen Description BLOOD LEFT ASSIST CONTROL  Final   Special Requests   Final    BOTTLES DRAWN AEROBIC AND ANAEROBIC Blood Culture adequate volume   Culture   Final    NO GROWTH 5 DAYS Performed at [facility], [address] [place], [place] [address]    Report Status 12/31/2021 FINAL  Final  Blood culture (routine x  2)     Status: Abnormal   Collection Time: 12/26/21 10:46 PM   Specimen: BLOOD  Result Value Ref Range Status   Specimen Description   Final    BLOOD RIGHT ANTECUBITAL Performed at [facility], [place] [address] [place], [place] [address]    Special Requests   Final    BOTTLES DRAWN AEROBIC AND ANAEROBIC Blood Culture adequate volume Performed at [facility], [address] [place], [place] [address]    Culture  Setup Time   Final    GRAM NEGATIVE RODS ANAEROBIC BOTTLE ONLY Organism ID to follow CRITICAL RESULT CALLED TO, READ BACK BY AND VERIFIED WITH: [facility] MITCHELL [id number] 12/27/21 HNM    Culture (A)  Final    ESCHERICHIA COLI Confirmed Extended Spectrum Beta-Lactamase Producer (ESBL).  In bloodstream infections from ESBL organisms, carbapenems are preferred over piperacillin/tazobactam. They are shown to have a lower risk of mortality.    Report Status 12/29/2021 FINAL  Final   Organism ID, Bacteria ESCHERICHIA COLI  Final      Susceptibility   Escherichia coli - MIC*    AMPICILLIN >=32 RESISTANT Resistant     CEFAZOLIN >=64 RESISTANT Resistant     CEFEPIME 16 RESISTANT Resistant     CEFTAZIDIME RESISTANT Resistant     CEFTRIAXONE >=64 RESISTANT Resistant     CIPROFLOXACIN <=0.25 SENSITIVE Sensitive     GENTAMICIN <=1 SENSITIVE Sensitive     IMIPENEM <=0.25 SENSITIVE Sensitive     TRIMETH/SULFA <=20 SENSITIVE Sensitive     AMPICILLIN/SULBACTAM 8 SENSITIVE Sensitive     PIP/TAZO <=4 SENSITIVE  Sensitive     * ESCHERICHIA COLI  Blood Culture ID Panel (Reflexed)     Status: Abnormal   Collection Time: 12/26/21 10:46 PM  Result Value Ref Range Status   Enterococcus faecalis NOT DETECTED NOT DETECTED Final   Enterococcus Faecium NOT DETECTED NOT DETECTED Final   Listeria monocytogenes NOT DETECTED NOT DETECTED Final   Staphylococcus species NOT DETECTED NOT DETECTED Final   Staphylococcus aureus (BCID) NOT DETECTED NOT DETECTED Final   Staphylococcus epidermidis NOT DETECTED NOT DETECTED Final   Staphylococcus lugdunensis NOT DETECTED NOT DETECTED Final   Streptococcus species NOT DETECTED NOT DETECTED Final   Streptococcus agalactiae NOT DETECTED NOT DETECTED Final   Streptococcus pneumoniae NOT DETECTED NOT DETECTED Final   Streptococcus pyogenes NOT DETECTED NOT DETECTED Final   A.calcoaceticus-baumannii NOT DETECTED NOT DETECTED Final   Bacteroides fragilis NOT DETECTED NOT DETECTED Final   Enterobacterales DETECTED (A) NOT DETECTED Final    Comment: Enterobacterales represent a large order of gram negative bacteria, not a single organism. CRITICAL RESULT CALLED TO, READ BACK BY AND VERIFIED WITH: DEVAN MITCHELL [id number] 12/27/21 HNM    Enterobacter cloacae complex NOT DETECTED NOT DETECTED Final   Escherichia coli DETECTED (A) NOT DETECTED Final    Comment: CRITICAL RESULT CALLED TO, READ BACK BY AND VERIFIED WITH: DEVAN MITCHELL [id number] 12/27/21 HNM    Klebsiella aerogenes NOT DETECTED NOT DETECTED Final   Klebsiella oxytoca NOT DETECTED NOT DETECTED Final   Klebsiella pneumoniae NOT DETECTED NOT DETECTED Final   Proteus species NOT DETECTED NOT DETECTED Final   Salmonella species NOT DETECTED NOT DETECTED Final   Serratia marcescens NOT DETECTED NOT DETECTED Final   Haemophilus influenzae NOT DETECTED NOT DETECTED Final   Neisseria meningitidis NOT DETECTED NOT DETECTED Final   Pseudomonas aeruginosa NOT DETECTED NOT DETECTED Final   Stenotrophomonas  maltophilia NOT DETECTED NOT DETECTED Final  Candida albicans NOT DETECTED NOT DETECTED Final   Candida auris NOT DETECTED NOT DETECTED Final   Candida glabrata NOT DETECTED NOT DETECTED Final   Candida krusei NOT DETECTED NOT DETECTED Final   Candida parapsilosis NOT DETECTED NOT DETECTED Final   Candida tropicalis NOT DETECTED NOT DETECTED Final   Cryptococcus neoformans/gattii NOT DETECTED NOT DETECTED Final   CTX-M ESBL DETECTED (A) NOT DETECTED Final    Comment: CRITICAL RESULT CALLED TO, READ BACK BY AND VERIFIED WITH: DEVAN MITCHELL [id number] 12/27/21 HNM (NOTE) Extended spectrum beta-lactamase detected. Recommend a carbapenem as initial therapy.      Carbapenem resistance IMP NOT DETECTED NOT DETECTED Final   Carbapenem resistance KPC NOT DETECTED NOT DETECTED Final   Carbapenem resistance NDM NOT DETECTED NOT DETECTED Final   Carbapenem resist OXA 48 LIKE NOT DETECTED NOT DETECTED Final   Carbapenem resistance VIM NOT DETECTED NOT DETECTED Final    Comment: Performed at [facility], [address], [place], [place] [address]  Urine Culture     Status: Abnormal   Collection Time: 12/27/21  9:59 AM   Specimen: Urine, Clean Catch  Result Value Ref Range Status   Specimen Description   Final    URINE, CLEAN CATCH Performed at [facility], [address] [place], [place] [address]    Special Requests   Final    NONE Performed at [facility], [address] [place], [place] [address]    Culture (A)  Final    <10,000 COLONIES/mL INSIGNIFICANT GROWTH Performed at [place] [address], [place], [place] [address]    Report Status 12/28/2021 FINAL  Final  Antifungal AST 9 Drug Panel     Status: None (Preliminary result)   Collection Time: 12/28/21  6:21 AM  Result Value Ref Range Status   Organism ID, Yeast Preliminary report  Final    Comment: (NOTE) Specimen has been received and testing has been initiated. Performed At:  [facility] [place], [place] [id number] Rush Farmer MD BM:[id number]    Amphotericin B MIC PENDING  Incomplete   Anidulafungin MIC PENDING  Incomplete   Caspofungin MIC PENDING  Incomplete   Micafungin MIC PENDING  Incomplete   Posaconazole MIC PENDING  Incomplete   Fluconazole Islt MIC PENDING  Incomplete   Flucytosine MIC PENDING  Incomplete   Itraconazole MIC PENDING  Incomplete   Voriconazole MIC PENDING  Incomplete   Source CANDIDA GLABRATA FROM BLOOD  Final    Comment: Performed at [facility], [place], [place], [place] [address]  MRSA Next Gen by PCR, Nasal     Status: Abnormal   Collection Time: 12/28/21  3:10 PM   Specimen: Nasal Mucosa; Nasal Swab  Result Value Ref Range Status   MRSA by PCR Next Gen DETECTED (A) NOT DETECTED Final    Comment: RESULT CALLED TO, READ BACK BY AND VERIFIED WITH: GINA SHANNON 1701 12/28/21 MU (NOTE) The GeneXpert MRSA Assay (FDA approved for NASAL specimens only), is one component of a comprehensive MRSA colonization surveillance program. It is not intended to diagnose MRSA infection nor to guide or monitor treatment for MRSA infections. Test performance is not FDA approved in patients less than 81 years old. Performed at [facility], [address] [place], [place] [address]   CULTURE, BLOOD (ROUTINE X 2) w Reflex to ID Panel     Status: None   Collection Time: 12/29/21  6:21 AM  Specimen: BLOOD  Result Value Ref Range Status   Specimen Description BLOOD RIGHT [facility]  Final   Special Requests   Final    BOTTLES DRAWN AEROBIC AND ANAEROBIC Blood Culture adequate volume   Culture   Final    NO GROWTH 5 DAYS Performed at [facility], [address] [place], [place] [address]    Report Status 01/03/2022 FINAL  Final  CULTURE, BLOOD (ROUTINE X 2) w Reflex to ID Panel     Status: Abnormal (Preliminary result)   Collection Time: 12/29/21  6:21 AM   Specimen: BLOOD  Result Value  Ref Range Status   Specimen Description   Final    BLOOD RIGHT HAND Performed at [facility], [address], [place], [place] [address]    Special Requests   Final    IN PEDIATRIC BOTTLE Blood Culture adequate volume Performed at [facility], [address] [place], [place] [address]    Culture  Setup Time   Final    YEAST IN PEDIATRIC BOTTLE Organism ID to follow CRITICAL RESULT CALLED TO, READ BACK BY AND VERIFIED WITH: BRANDON BEERS @1310  12/30/21 SCS    Culture (A)  Final    CANDIDA GLABRATA Sent to [place] for further susceptibility testing. Performed at [facility], [place] [address], [place], [place] [address]    Report Status PENDING  Incomplete  Blood Culture ID Panel (Reflexed)     Status: Abnormal   Collection Time: 12/29/21  6:21 AM  Result Value Ref Range Status   Enterococcus faecalis NOT DETECTED NOT DETECTED Final   Enterococcus Faecium NOT DETECTED NOT DETECTED Final   Listeria monocytogenes NOT DETECTED NOT DETECTED Final   Staphylococcus species NOT DETECTED NOT DETECTED Final   Staphylococcus aureus (BCID) NOT DETECTED NOT DETECTED Final   Staphylococcus epidermidis NOT DETECTED NOT DETECTED Final   Staphylococcus lugdunensis NOT DETECTED NOT DETECTED Final   Streptococcus species NOT DETECTED NOT DETECTED Final   Streptococcus agalactiae NOT DETECTED NOT DETECTED Final   Streptococcus pneumoniae NOT DETECTED NOT DETECTED Final   Streptococcus pyogenes NOT DETECTED NOT DETECTED Final   A.calcoaceticus-baumannii NOT DETECTED NOT DETECTED Final   Bacteroides fragilis NOT DETECTED NOT DETECTED Final   Enterobacterales NOT DETECTED NOT DETECTED Final   Enterobacter cloacae complex NOT DETECTED NOT DETECTED Final   Escherichia coli NOT DETECTED NOT DETECTED Final   Klebsiella aerogenes NOT DETECTED NOT DETECTED Final   Klebsiella oxytoca NOT DETECTED NOT DETECTED Final   Klebsiella pneumoniae NOT DETECTED NOT DETECTED Final   Proteus  species NOT DETECTED NOT DETECTED Final   Salmonella species NOT DETECTED NOT DETECTED Final   Serratia marcescens NOT DETECTED NOT DETECTED Final   Haemophilus influenzae NOT DETECTED NOT DETECTED Final   Neisseria meningitidis NOT DETECTED NOT DETECTED Final   Pseudomonas aeruginosa NOT DETECTED NOT DETECTED Final   Stenotrophomonas maltophilia NOT DETECTED NOT DETECTED Final   Candida albicans NOT DETECTED NOT DETECTED Final   Candida auris NOT DETECTED NOT DETECTED Final   Candida glabrata DETECTED (A) NOT DETECTED Final    Comment: CRITICAL RESULT CALLED TO, READ BACK BY AND VERIFIED WITH: BRANDON BEERS @1310  12/30/21 SCS    Candida krusei NOT DETECTED NOT DETECTED Final   Candida parapsilosis NOT DETECTED NOT DETECTED Final   Candida tropicalis NOT DETECTED NOT DETECTED Final   Cryptococcus neoformans/gattii NOT DETECTED NOT DETECTED Final    Comment: Performed at [facility], [address], [place], [place] [address]  CULTURE, BLOOD (ROUTINE X 2) w Reflex to ID Panel     Status: None (Preliminary result)   Collection Time: 12/31/21  3:08 AM   Specimen: BLOOD  Result Value Ref Range Status   Specimen Description BLOOD BLOOD RIGHT HAND  Final   Special Requests   Final    BOTTLES DRAWN AEROBIC AND ANAEROBIC Blood Culture adequate volume   Culture   Final    NO GROWTH 3 DAYS Performed at [facility], [address] [place], [place] [address]    Report Status PENDING  Incomplete  CULTURE, BLOOD (ROUTINE X 2) w Reflex to ID Panel     Status: None (Preliminary result)   Collection Time: 12/31/21  3:08 AM   Specimen: BLOOD  Result Value Ref Range Status   Specimen Description BLOOD BLOOD LEFT HAND  Final   Special Requests   Final    BOTTLES DRAWN AEROBIC AND ANAEROBIC Blood Culture adequate volume   Culture   Final    NO GROWTH 3 DAYS Performed at [facility], [address] [place], [place] [address]    Report Status PENDING  Incomplete     Coagulation Studies: No results for input(s): LABPROT, INR in the last 72 hours.  Urinalysis: No results for input(s): COLORURINE, LABSPEC, PHURINE, GLUCOSEU, HGBUR, BILIRUBINUR, KETONESUR, PROTEINUR, UROBILINOGEN, NITRITE, LEUKOCYTESUR in the last 72 hours.  Invalid input(s): APPERANCEUR     Imaging: DG OR UROLOGY CYSTO IMAGE ([place])  Result Date: 01/02/2022 There is no interpretation for this exam.  This order is for images obtained during a surgical procedure.  Please See "Surgeries" Tab for more information regarding the procedure.   ECHO TEE  Result Date: 01/02/2022    TRANSESOPHOGEAL ECHO REPORT   Patient Name:   [facility] Date of Exam: 01/02/2022 Medical Rec #:  [id number]  Height:       69.0 in Accession #:    [id number] Weight:       201.6 lb Date of Birth:  [birth year] BSA:          2.073 m Patient Age:    11 years   BP:           116/94 mmHg Patient Gender: M          HR:           84 bpm. Exam Location:  [facility] Procedure: Transesophageal Echo, Cardiac Doppler, Color Doppler and Saline            Contrast Bubble Study Indications:     Not listed on TEE check-in sheet  History:         Patient has prior history of Echocardiogram examinations, most                  recent 01/01/2022. COPD; Risk Factors:Hypertension.  Sonographer:     Sherrie Sport Referring Phys:  [id number] [place] TANG Diagnosing Phys: Serafina Royals MD PROCEDURE: The transesophogeal probe was passed without difficulty through the esophogus of the patient. Sedation performed by performing physician. The patient developed no complications during the procedure. IMPRESSIONS  1. Left ventricular ejection fraction, by estimation, is 50 to 55%. The left ventricle has low normal function. The left ventricle has no regional wall motion abnormalities.  2. Right ventricular systolic function is normal. The right ventricular size is normal.  3. No left atrial/left atrial appendage thrombus was detected.  4. The mitral valve is  normal in structure. Mild mitral valve regurgitation.  5. The  aortic valve is normal in structure. Aortic valve regurgitation is trivial.  6. There is Moderate (Grade III) plaque.  7. Agitated saline contrast bubble study was negative, with no evidence of any interatrial shunt. FINDINGS  Left Ventricle: Left ventricular ejection fraction, by estimation, is 50 to 55%. The left ventricle has low normal function. The left ventricle has no regional wall motion abnormalities. The left ventricular internal cavity size was normal in size. Right Ventricle: The right ventricular size is normal. No increase in right ventricular wall thickness. Right ventricular systolic function is normal. Left Atrium: Left atrial size was normal in size. Spontaneous echo contrast was present. No left atrial/left atrial appendage thrombus was detected. Right Atrium: Right atrial size was normal in size. Pericardium: There is no evidence of pericardial effusion. Mitral Valve: The mitral valve is normal in structure. Mild mitral valve regurgitation. There is no evidence of mitral valve vegetation. Tricuspid Valve: The tricuspid valve is normal in structure. Tricuspid valve regurgitation is mild. There is no evidence of tricuspid valve vegetation. Aortic Valve: The aortic valve is normal in structure. Aortic valve regurgitation is trivial. There is no evidence of aortic valve vegetation. Pulmonic Valve: The pulmonic valve was normal in structure. Pulmonic valve regurgitation is trivial. There is no evidence of pulmonic valve vegetation. Aorta: The aortic root and ascending aorta are structurally normal, with no evidence of dilitation. There is moderate (Grade III) plaque. IAS/Shunts: No atrial level shunt detected by color flow Doppler. Agitated saline contrast was given intravenously to evaluate for intracardiac shunting. Agitated saline contrast bubble study was negative, with no evidence of any interatrial shunt. There  is no evidence of a  patent foramen ovale. There is no evidence of an atrial septal defect. Serafina Royals MD Electronically signed by Serafina Royals MD Signature Date/Time: 01/02/2022/8:53:08 AM    Final    [place] EKG SITE RITE  Result Date: 01/03/2022 If Site Rite image not attached, placement could not be confirmed due to current cardiac rhythm.    Medications:    sodium chloride Stopped (12/30/21 1542)   sodium chloride Stopped (01/02/22 0910)   anidulafungin Stopped (01/03/22 1217)   meropenem (MERREM) IV Stopped (01/03/22 1008)    amiodarone  200 mg Oral BID   [START ON 01/06/2022] amiodarone  200 mg Oral Daily   atorvastatin  80 mg Oral Daily   Chlorhexidine Gluconate Cloth  6 each Topical Daily   heparin  5,000 Units Subcutaneous Q12H   insulin aspart  0-15 Units Subcutaneous TID WC   insulin aspart  0-5 Units Subcutaneous QHS   levalbuterol  2 puff Inhalation Q8H   melatonin  5 mg Oral QHS   midodrine  5 mg Oral TID WC   multivitamin with minerals  1 tablet Oral Daily   pantoprazole  40 mg Oral BID   pneumococcal 23 valent vaccine  0.5 mL Intramuscular Tomorrow-1000   sertraline  100 mg Oral BID   sodium chloride flush  3 mL Intravenous Q12H   sodium chloride flush  3 mL Intravenous Q12H   tamsulosin  0.4 mg Oral Daily   tiotropium  1 capsule Inhalation Daily   traZODone  50 mg Oral QHS   sodium chloride, acetaminophen **OR** acetaminophen, menthol-cetylpyridinium, ondansetron **OR** ondansetron (ZOFRAN) IV, oxybutynin, oxyCODONE, sodium chloride flush  Assessment/ Plan:  Mr. Wayne Burns is a 66 y.o.  male with past medical conditions including COPD and hypertension , who was admitted to [facility] on 12/26/2021 for Atrial fibrillation with rapid ventricular response (  [place]) [I48.91] Acute cystitis without hematuria [N30.00] AKI (acute kidney injury) ([place]) [N17.9] Septic shock ([place]) [A41.9, R65.21] Sepsis ([place]) [A41.9] Sepsis, due to unspecified organism, unspecified whether acute organ dysfunction  present ([place]) [A41.9]   Acute kidney injury with chronic kidney disease stage IV.  Baseline appears to be 2.8 with GFR 25 on 12/22/2021.  AKI may be due to possible obstruction from migrating stent recently placed at [facility].    Renal function continues to improve slowly.  Patient underwent cystoscopy with stent replacement yesterday.  We will schedule outpatient follow-up with our office.  Lab Results  Component Value Date   CREATININE 2.63 (H) 01/03/2022   CREATININE 2.70 (H) 01/02/2022   CREATININE 2.78 (H) 01/01/2022    Intake/Output Summary (Last 24 hours) at 01/03/2022 1422 Last data filed at 01/03/2022 1225 Gross per 24 hour  Intake 1150.32 ml  Output 4150 ml  Net -2999.68 ml    2.  Hypotension.  Currently on amiodarone drip with midodrine schedule III times daily.  Home regimen of amlodipine currently held  Blood pressure 108/59  3.  Acute metabolic acidosis.  Serum bicarbonate also fluctuating.  Resolved with sodium bicarb infusion   LOS: 7 Reeya Bound 3/1/20232:22 PM

## 2022-01-03 NOTE — Assessment & Plan Note (Addendum)
MAP's stable. ?On low-dose midodrine. ?Continue midodrine at discharge.  Outpatient PCP and cardiology follow-ups. ?

## 2022-01-03 NOTE — TOC Progression Note (Signed)
Transition of Care (TOC) - Progression Note  ? ? ?Patient Details  ?Name: Kobyn Hoeger ?MRN: [id number] ?Date of Birth: May 29, 1956 ? ?Transition of Care (TOC) CM/SW Contact  ?Alberteen Sam, LCSW ?Phone Number: ?01/03/2022, 9:04 AM ? ?Clinical Narrative:    ? ?CSW spoke with Pam with [facility] who reports they are ready for patient to receive picc and they will service for home iv, reports he is also set up with [facility].  ? ?CSW has updated MD.  ? ? ? ?Expected Discharge Plan: Home/Self Care ?Barriers to Discharge: Continued Medical Work up ? ?Expected Discharge Plan and Services ?Expected Discharge Plan: Home/Self Care ?  ?Discharge Planning Services: CM Consult ?  ?Living arrangements for the past 2 months: Mobile Home ?                ?DME Arranged: N/A ?DME Agency: NA ?  ?  ?  ?HH Arranged: NA, Patient Refused HH ?[place] Agency: NA ?  ?  ?  ? ? ?Social Determinants of Health (SDOH) Interventions ?  ? ?Readmission Risk Interventions ?No flowsheet data found. ? ?

## 2022-01-03 NOTE — Progress Notes (Signed)
?Progress Note ? ? ?Wayne Burns JME:[id number] DOB: 03/04/1956 DOA: 12/26/2021     7 ?DOS: the patient was seen and examined on 01/03/2022 ?  ?Brief hospital course: ?Patient is a 66 year old male who was brought by his family as an IVC for not being able to take care of himself, living in poor condition, refusing to go to hospital.  As per report, he recently left AMA from [facility] where he was being treated for kidney stones, COVID.  On condition he was tachycardic, disoriented.  Lab work showed AKI with creatinine in the range of 3, leukocytosis, elevated BNP.  COVID screen test was positive.  Urinalysis consistent with UTI.  Hospital course also remarkable for hypotension, A-fib with RVR.  Cardiology, nephrology, urology consulted ? ?Assessment and Plan: ?* Septic shock ([place])- (present on admission) ?ESBL acute cystitis and Candida glabrata fungemia.  Case discussed with pharmacist and is on meropenem today and will be switched over to IV Invanz for tomorrow.  (treatment for this through 01/16/2022).  Patient also on IV Eraxis and will be on treatment through 01/14/2022.  Spoke with vascular surgery about placing a Hickman catheter. ? ?Ureteral stent displacement, initial encounter [facility])- (present on admission) ?Pt has right stent malposition. ?Dr. Bernardo Heater recommended conservative management. ? ?Anemia of chronic disease ?Hemoglobin dipped down to 7.1 today.  We will give 1 unit of packed red blood cells and recheck creatinine again tomorrow. ? ?COVID-19 virus infection ?Previous diagnosis at [facility] not on isolation at this point.  Asymptomatic ? ?Depression- (present on admission) ?Seen by psychiatry and no further recommendations. ? ?Hypotension ?Patient on low-dose midodrine. ? ?Atrial fibrillation with RVR ([place])- (present on admission) ?Patient was on amiodarone IV and now on oral amiodarone.  They are holding off on anticoagulation at this point ? ?Elevated troponin- (present on admission) ?Attribute  patient's elevated troponin to sepsis, tachycardia.  Denies any chest pain ? ? ?AKI (acute kidney injury) ([place])- (present on admission) ?Acute kidney injury likely on chronic kidney disease stage IV.  Creatinine peaked at 3.86 on 12/28/2021.  Creatinine down to 2.63 today.  Recent creatinine at [facility] on 12/22/2021 was 2.8. ? ? ? ? ? ? ? ?Subjective: Patient feeling better.  He is interested in getting out of the hospital and going home.  He states he will be able to do the IV antibiotics.  Patient's brother states that he likely will not be able to help him. ? ?Physical Exam: ?Vitals:  ? 01/03/22 1143 01/03/22 1200 01/03/22 1711 01/03/22 1728  ?BP: (!) 108/59  129/81 136/79  ?Pulse: 81  81 82  ?Resp: 18 19 18 18   ?Temp: 98.2 ?F (36.8 ?C)  98 ?F (36.7 ?C) 98.2 ?F (36.8 ?C)  ?TempSrc:      ?SpO2: 99%  100% 100%  ?Weight:      ?Height:      ? ?Physical Exam ?HENT:  ?   Head: Normocephalic.  ?   Mouth/Throat:  ?   Pharynx: No oropharyngeal exudate.  ?Eyes:  ?   General: Lids are normal.  ?   Conjunctiva/sclera: Conjunctivae normal.  ?Cardiovascular:  ?   Rate and Rhythm: Normal rate and regular rhythm.  ?   Heart sounds: Normal heart sounds, S1 normal and S2 normal.  ?Pulmonary:  ?   Breath sounds: Normal breath sounds. No decreased breath sounds, wheezing, rhonchi or rales.  ?Abdominal:  ?   Palpations: Abdomen is soft.  ?   Tenderness: There is no abdominal tenderness.  ?  Musculoskeletal:  ?   Right lower leg: No swelling.  ?   Left lower leg: No swelling.  ?Skin: ?   General: Skin is warm.  ?   Findings: No rash.  ?Neurological:  ?   Mental Status: He is alert and oriented to person, place, and time.  ?  ? ?Data Reviewed: ?Creatinine 2.63.  Hemoglobin 7.1.  Platelet count 135. ? ?Family Communication: Spoke with patient's brother on the phone ? ?Disposition: ?Status is: Inpatient ?Will need IV access in order to do IV antibiotics at home. ? ?Planned Discharge Destination: Potentially home with home health   ? ? ?Author: ?Loletha Grayer, MD ?01/03/2022 5:45 PM ? ?For on call review [email or web address].  ? ?

## 2022-01-03 NOTE — Progress Notes (Signed)
Inpatient Diabetes Program Recommendations ? ?AACE/ADA: New Consensus Statement on Inpatient Glycemic Control  ? ?Target Ranges:  Prepandial:   less than 140 mg/dL ?     Peak postprandial:   less than 180 mg/dL (1-2 hours) ?     Critically ill patients:  140 - 180 mg/dL  ? ? Latest Reference Range & Units 01/02/22 09:28 01/02/22 10:50 01/02/22 12:54 01/02/22 15:51 01/02/22 20:48  ?Glucose-Capillary 70 - 99 mg/dL 86 88 88 153 (H) 185 (H)  ? ? Latest Reference Range & Units 12/28/21 05:00  ?Hemoglobin A1C 4.8 - 5.6 % 5.9 (H)  ? ?Review of Glycemic Control ? ?Diabetes history: NO ?Outpatient Diabetes medications: NA ?Current orders for Inpatient glycemic control: Levemir 10 units daily, Novolog 0-15 units TID with meals, Novolog 0-5 units QHS ? ?Inpatient Diabetes Program Recommendations:   ? ?Insulin: Please consider discontinuing Levemir. ? ?Thanks, ?Barnie Alderman, RN, MSN, CDE ?Diabetes Coordinator ?Inpatient Diabetes Program ?[phone] (Team Pager from 8am to 5pm) ? ? ? ?

## 2022-01-03 NOTE — Assessment & Plan Note (Addendum)
Resolved.  ESBL E. coli acute cystitis and Candida glabrata fungemia.  Completed full course of IV antibiotics today 3/12.   ?Treated with IV Invanz and IV Eraxis. ?

## 2022-01-03 NOTE — Progress Notes (Signed)
[place] NOTE       Patient ID: Wayne Burns MRN: [id number] DOB/AGE: 01/22/1956 66 y.o.  Admit date: 12/26/2021 Referring Physician Dr. Shelly Coss  Primary Physician Dr. Earlie Counts [facility]  Primary Cardiologist  Reason for Consultation AF RVR  HPI: The patient is a 66 year old male with a past medical history notable for CAD, hyperlipidemia, hypertension, COPD, who was brought in by EMS under IVC by his family members for not being able to take care of himself.  He was apparently living in very poor conditions, refusing to go to the hospital and was deemed a danger to himself. He was hospitalized at [facility] for 10 days and discharged on 2/17 after bilateral ureteral stent placements and cardiology f/u was recommended for discussion of complex PCI placement in the setting of CAD, sepsis, renal and respiratory failure. On arrival to [facility] ED 2/21 patient met sepsis criteria and in atrial fibrillation with RVR.  Cardiology is consulted for further assistance of his A-fib. Back in NSR on 2/28 on PO amiodarone. Not on anticoagulation d/t ongoing anemia.   Interval History:  -no acute events overnight. Remains in NSR.  -feels fatigued this morning but continues to deny chest pain or shortness of breath.   Review of systems complete and found to be negative unless listed above    Past Medical History:  Diagnosis Date   COPD (chronic obstructive pulmonary disease) ([place])    Hypertension     Past Surgical History:  Procedure Laterality Date   ANKLE SURGERY     CYSTOSCOPY W/ URETERAL STENT PLACEMENT Right 01/02/2022   Procedure: CYSTOSCOPY WITH STENT REPLACEMENT;  Surgeon: Abbie Sons, MD;  Location: [facility] ORS;  Service: Urology;  Laterality: Right;   TEE WITHOUT CARDIOVERSION     TEE WITHOUT CARDIOVERSION N/A 01/02/2022   Procedure: TRANSESOPHAGEAL ECHOCARDIOGRAM (TEE);  Surgeon: Corey Skains, MD;  Location: [facility] ORS;  Service: Cardiovascular;  Laterality:  N/A;    Medications Prior to Admission  Medication Sig Dispense Refill Last Dose   amLODipine (NORVASC) 10 MG tablet Take 10 mg by mouth daily.      atorvastatin (LIPITOR) 80 MG tablet Take 80 mg by mouth daily.      cefdinir (OMNICEF) 300 MG capsule Take 300 mg by mouth once.      pantoprazole (PROTONIX) 40 MG tablet Take 40 mg by mouth 2 (two) times daily.      sertraline (ZOLOFT) 100 MG tablet Take 100 mg by mouth 2 (two) times daily.      tamsulosin (FLOMAX) 0.4 MG CAPS capsule Take 0.4 mg by mouth daily.      tiotropium (SPIRIVA HANDIHALER) 18 MCG inhalation capsule Place 1 capsule into inhaler and inhale daily.      traZODone (DESYREL) 50 MG tablet Take 100 mg by mouth at bedtime.       Social History   Socioeconomic History   Marital status: Single    Spouse name: Not on file   Number of children: Not on file   Years of education: Not on file   Highest education level: Not on file  Occupational History   Not on file  Tobacco Use   Smoking status: Former    Packs/day: 0.50    Types: Cigarettes   Smokeless tobacco: Never  Vaping Use   Vaping Use: Never used  Substance and Sexual Activity   Alcohol use: Not Currently    Comment: often   Drug use: Not Currently   Sexual activity:  Not on file  Other Topics Concern   Not on file  Social History Narrative   Not on file   Social Determinants of Health   Financial Resource Strain: Not on file  Food Insecurity: Not on file  Transportation Needs: Not on file  Physical Activity: Not on file  Stress: Not on file  Social Connections: Not on file  Intimate Partner Violence: Not on file    History reviewed. No pertinent family history.    Review of systems complete and found to be negative unless listed above    PHYSICAL EXAM General: Disheveled Caucasian male, in no acute distress.  Sitting upright in PCU bed eating lunch wearing street clothes.  HEENT:  Normocephalic and atraumatic. Neck:  No JVD.  Lungs: Normal  respiratory effort on room air. No crackles. Heart: regular rate and rhythm. Normal S1 and S2 without gallops or murmurs. Radial & DP pulses 2+ bilaterally. Abdomen: Obese appearing.  Msk: Normal strength and tone for age. Extremities: Warm and well perfused. No clubbing, cyanosis. No LEE.  Neuro: Alert and oriented X 3. Psych:  Answers questions appropriately.   Labs:   Lab Results  Component Value Date   WBC 4.8 01/03/2022   HGB 7.1 (L) 01/03/2022   HCT 22.2 (L) 01/03/2022   MCV 89.2 01/03/2022   PLT 135 (L) 01/03/2022    Recent Labs  Lab 12/29/21 0716 12/30/21 0624 01/02/22 0639  NA 127*   < > 135  K 3.7   < > 4.3  CL 97*   < > 98  CO2 18*   < > 24  BUN 57*   < > 40*  CREATININE 3.34*   < > 2.70*  CALCIUM 7.4*   < > 8.4*  PROT 7.0  --   --   BILITOT 0.7  --   --   ALKPHOS 157*  --   --   ALT 35  --   --   AST 18  --   --   GLUCOSE 325*   < > 95   < > = values in this interval not displayed.    Lab Results  Component Value Date   CKTOTAL 10 (L) 12/26/2021    No results found for: CHOL No results found for: HDL No results found for: LDLCALC No results found for: TRIG No results found for: CHOLHDL No results found for: LDLDIRECT    Radiology: CT ABDOMEN PELVIS WO CONTRAST  Addendum Date: 12/26/2021   ADDENDUM REPORT: 12/26/2021 23:44 ADDENDUM: It should be noted that the air seen within the wall of the urinary bladder may represent sequelae associated with the previously described malpositioned right-sided endo ureteral stent. Electronically Signed   By: Virgina Norfolk M.D.   On: 12/26/2021 23:44   Result Date: 12/26/2021 CLINICAL DATA:  Patient with altered mentation and recent history of COVID positive. EXAM: CT ABDOMEN AND PELVIS WITHOUT CONTRAST TECHNIQUE: Multidetector CT imaging of the abdomen and pelvis was performed following the standard protocol without IV contrast. RADIATION DOSE REDUCTION: This exam was performed according to the departmental  dose-optimization program which includes automated exposure control, adjustment of the mA and/or kV according to patient size and/or use of iterative reconstruction technique. COMPARISON:  July 14, 2019 FINDINGS: Lower chest: No acute abnormality. Hepatobiliary: No focal liver abnormality is seen. No gallstones, gallbladder wall thickening, or biliary dilatation. Pancreas: Unremarkable. No pancreatic ductal dilatation or surrounding inflammatory changes. Spleen: There is mild to moderate severity splenomegaly. Adrenals/Urinary Tract: Adrenal glands  are unremarkable. Kidneys are normal in size, without focal lesions. Bilateral endo ureteral stents are noted. The proximal portion of the right-sided endo ureteral stent is seen inferior to the right renal pelvis (coronal reformatted images 46 through 50, CT series 5), while the left-sided endo ureteral stent is properly positioned. Ill-defined subcentimeter renal calculi are seen within the right renal pelvis which is mildly dilated. Numerous subcentimeter non-obstructing renal calculi are seen within the left kidney. There is marked severity diffuse urinary bladder wall thickening with air suspected within the lateral aspect of the bladder wall on the right. A Foley catheter is also present within the bladder lumen. Stomach/Bowel: Stomach is within normal limits. Appendix appears normal. No evidence of bowel wall thickening, distention, or inflammatory changes. Vascular/Lymphatic: Aortic atherosclerosis. No enlarged abdominal or pelvic lymph nodes. Reproductive: The prostate gland is remarkable. Other: No abdominal wall hernia or abnormality. No abdominopelvic ascites. Musculoskeletal: Multilevel degenerative changes seen throughout the lumbar spine. IMPRESSION: 1. Bilateral endo ureteral stents, with a malpositioned right-sided endo ureteral stent, as described above. Surgical an urology consultation is recommended. 2. Marked severity diffuse urinary bladder wall  thickening with air suspected within the lateral aspect of the bladder wall on the right which may represent sequelae associated with cystitis. The presence of an underlying neoplastic process cannot be excluded. 3. Renal calculi within the right renal pelvis with numerous subcentimeter non-obstructing left renal calculi. 4. Splenomegaly. 5. Aortic atherosclerosis. Aortic Atherosclerosis (ICD10-I70.0). Electronically Signed: By: Virgina Norfolk M.D. On: 12/26/2021 23:40   DG Chest Portable 1 View  Result Date: 12/26/2021 CLINICAL DATA:  New atrial fibrillation. EXAM: PORTABLE CHEST 1 VIEW COMPARISON:  Chest radiograph 10/14/2019 FINDINGS: Stable heart size allowing for differences in technique. Unchanged mediastinal contours. Aortic atherosclerosis and coronary stent. Mild hyperinflation and bronchial thickening, chronic. No acute airspace disease. No pulmonary edema. No pleural effusion or pneumothorax no acute osseous findings. IMPRESSION: 1. No acute chest findings. 2. Mild chronic hyperinflation and bronchial thickening. 3. Aortic atherosclerosis and coronary stent. Electronically Signed   By: Keith Rake M.D.   On: 12/26/2021 20:46   DG OR UROLOGY CYSTO IMAGE ([facility] ONLY)  Result Date: 01/02/2022 There is no interpretation for this exam.  This order is for images obtained during a surgical procedure.  Please See "Surgeries" Tab for more information regarding the procedure.   ECHOCARDIOGRAM COMPLETE  Result Date: 01/01/2022    ECHOCARDIOGRAM REPORT   Patient Name:   Wayne Burns Date of Exam: 01/01/2022 Medical Rec #:  [id number]  Height:       69.0 in Accession #:    [id number] Weight:       201.6 lb Date of Birth:  November 05, 1956 BSA:          2.073 m Patient Age:    66 years   BP:           108/77 mmHg Patient Gender: M          HR:           90 bpm. Exam Location:  [facility] Procedure: 2D Echo, Color Doppler and Cardiac Doppler Indications:     Bacteremia R78.81  History:         Patient has no prior  history of Echocardiogram examinations.                  COPD; Risk Factors:Hypertension.  Sonographer:     Sherrie Sport Referring Phys:  [id number] Sidney Ace Diagnosing Phys: Isaias Cowman MD  Sonographer Comments:  Suboptimal apical window. IMPRESSIONS  1. Left ventricular ejection fraction, by estimation, is 60 to 65%. The left ventricle has normal function. The left ventricle has no regional wall motion abnormalities. Left ventricular diastolic parameters are indeterminate.  2. Right ventricular systolic function is normal. The right ventricular size is normal.  3. The mitral valve is normal in structure. Mild to moderate mitral valve regurgitation. No evidence of mitral stenosis.  4. The aortic valve is normal in structure. Aortic valve regurgitation is not visualized. No aortic stenosis is present.  5. The inferior vena cava is normal in size with greater than 50% respiratory variability, suggesting right atrial pressure of 3 mmHg. Conclusion(s)/Recommendation(s): No evidence of valvular vegetations on this transthoracic echocardiogram. Consider a transesophageal echocardiogram to exclude infective endocarditis if clinically indicated. FINDINGS  Left Ventricle: Left ventricular ejection fraction, by estimation, is 60 to 65%. The left ventricle has normal function. The left ventricle has no regional wall motion abnormalities. The left ventricular internal cavity size was normal in size. There is  no left ventricular hypertrophy. Left ventricular diastolic parameters are indeterminate. Right Ventricle: The right ventricular size is normal. No increase in right ventricular wall thickness. Right ventricular systolic function is normal. Left Atrium: Left atrial size was normal in size. Right Atrium: Right atrial size was normal in size. Pericardium: There is no evidence of pericardial effusion. Mitral Valve: The mitral valve is normal in structure. Mild to moderate mitral valve regurgitation. No evidence  of mitral valve stenosis. MV peak gradient, 6.0 mmHg. The mean mitral valve gradient is 2.0 mmHg. Tricuspid Valve: The tricuspid valve is normal in structure. Tricuspid valve regurgitation is mild . No evidence of tricuspid stenosis. Aortic Valve: The aortic valve is normal in structure. Aortic valve regurgitation is not visualized. No aortic stenosis is present. Aortic valve mean gradient measures 2.5 mmHg. Aortic valve peak gradient measures 4.1 mmHg. Aortic valve area, by VTI measures 2.60 cm. Pulmonic Valve: The pulmonic valve was normal in structure. Pulmonic valve regurgitation is not visualized. No evidence of pulmonic stenosis. Aorta: The aortic root is normal in size and structure. Venous: The inferior vena cava is normal in size with greater than 50% respiratory variability, suggesting right atrial pressure of 3 mmHg. IAS/Shunts: No atrial level shunt detected by color flow Doppler.  LEFT VENTRICLE PLAX 2D LVIDd:         4.80 cm LVIDs:         3.30 cm LV PW:         1.20 cm LV IVS:        1.70 cm LVOT diam:     2.00 cm LV SV:         46 LV SV Index:   22 LVOT Area:     3.14 cm  RIGHT VENTRICLE RV S prime:     11.70 cm/s TAPSE (M-mode): 1.6 cm LEFT ATRIUM            Index        RIGHT ATRIUM           Index LA diam:      4.70 cm  2.27 cm/m   RA Area:     13.10 cm LA Vol (A2C): 61.3 ml  29.57 ml/m  RA Volume:   29.30 ml  14.13 ml/m LA Vol (A4C): 105.0 ml 50.65 ml/m  AORTIC VALVE                    PULMONIC VALVE AV Area (Vmax):  2.94 cm     PV Vmax:        0.65 m/s AV Area (Vmean):   2.87 cm     PV Vmean:       48.450 cm/s AV Area (VTI):     2.60 cm     PV VTI:         0.130 m AV Vmax:           101.00 cm/s  PV Peak grad:   1.7 mmHg AV Vmean:          73.150 cm/s  PV Mean grad:   1.0 mmHg AV VTI:            0.179 m      RVOT Peak grad: 3 mmHg AV Peak Grad:      4.1 mmHg AV Mean Grad:      2.5 mmHg LVOT Vmax:         94.60 cm/s LVOT Vmean:        66.800 cm/s LVOT VTI:          0.148 m LVOT/AV VTI  ratio: 0.83  AORTA Ao Root diam: 3.33 cm MITRAL VALVE                TRICUSPID VALVE MV Area (PHT): 6.02 cm     TR Peak grad:   6.4 mmHg MV Area VTI:   1.49 cm     TR Vmax:        126.00 cm/s MV Peak grad:  6.0 mmHg MV Mean grad:  2.0 mmHg     SHUNTS MV Vmax:       1.22 m/s     Systemic VTI:  0.15 m MV Vmean:      59.4 cm/s    Systemic Diam: 2.00 cm MV Decel Time: 126 msec     Pulmonic VTI:  0.159 m MV E velocity: 131.00 cm/s Isaias Cowman MD Electronically signed by Isaias Cowman MD Signature Date/Time: 01/01/2022/1:13:17 PM    Final    ECHO TEE  Result Date: 01/02/2022    TRANSESOPHOGEAL ECHO REPORT   Patient Name:   Wayne Burns Date of Exam: 01/02/2022 Medical Rec #:  [id number]  Height:       69.0 in Accession #:    [id number] Weight:       201.6 lb Date of Birth:  Sep 08, 1956 BSA:          2.073 m Patient Age:    47 years   BP:           116/94 mmHg Patient Gender: M          HR:           84 bpm. Exam Location:  [facility] Procedure: Transesophageal Echo, Cardiac Doppler, Color Doppler and Saline            Contrast Bubble Study Indications:     Not listed on TEE check-in sheet  History:         Patient has prior history of Echocardiogram examinations, most                  recent 01/01/2022. COPD; Risk Factors:Hypertension.  Sonographer:     Sherrie Sport Referring Phys:  [id number] [place] Brailynn Breth Diagnosing Phys: Serafina Royals MD PROCEDURE: The transesophogeal probe was passed without difficulty through the esophogus of the patient. Sedation performed by performing physician. The patient developed no complications during the procedure. IMPRESSIONS  1. Left ventricular ejection fraction, by estimation, is  50 to 55%. The left ventricle has low normal function. The left ventricle has no regional wall motion abnormalities.  2. Right ventricular systolic function is normal. The right ventricular size is normal.  3. No left atrial/left atrial appendage thrombus was detected.  4. The mitral valve is normal in  structure. Mild mitral valve regurgitation.  5. The aortic valve is normal in structure. Aortic valve regurgitation is trivial.  6. There is Moderate (Grade III) plaque.  7. Agitated saline contrast bubble study was negative, with no evidence of any interatrial shunt. FINDINGS  Left Ventricle: Left ventricular ejection fraction, by estimation, is 50 to 55%. The left ventricle has low normal function. The left ventricle has no regional wall motion abnormalities. The left ventricular internal cavity size was normal in size. Right Ventricle: The right ventricular size is normal. No increase in right ventricular wall thickness. Right ventricular systolic function is normal. Left Atrium: Left atrial size was normal in size. Spontaneous echo contrast was present. No left atrial/left atrial appendage thrombus was detected. Right Atrium: Right atrial size was normal in size. Pericardium: There is no evidence of pericardial effusion. Mitral Valve: The mitral valve is normal in structure. Mild mitral valve regurgitation. There is no evidence of mitral valve vegetation. Tricuspid Valve: The tricuspid valve is normal in structure. Tricuspid valve regurgitation is mild. There is no evidence of tricuspid valve vegetation. Aortic Valve: The aortic valve is normal in structure. Aortic valve regurgitation is trivial. There is no evidence of aortic valve vegetation. Pulmonic Valve: The pulmonic valve was normal in structure. Pulmonic valve regurgitation is trivial. There is no evidence of pulmonic valve vegetation. Aorta: The aortic root and ascending aorta are structurally normal, with no evidence of dilitation. There is moderate (Grade III) plaque. IAS/Shunts: No atrial level shunt detected by color flow Doppler. Agitated saline contrast was given intravenously to evaluate for intracardiac shunting. Agitated saline contrast bubble study was negative, with no evidence of any interatrial shunt. There  is no evidence of a patent  foramen ovale. There is no evidence of an atrial septal defect. Serafina Royals MD Electronically signed by Serafina Royals MD Signature Date/Time: 01/02/2022/8:53:08 AM    Final     ECHO 12/12/21 Summary    1. An ultrasound enhancing agent was used to improve the visualization of  the left ventricular cavity and endocardial borders.    2. Technically difficult study.    3. The left ventricle is upper normal in size with normal wall thickness.    4. The left ventricular systolic function is severely decreased, LVEF is  visually estimated at 25-30%.    5. There is thinning and akinesis involving the mid inferior and basal  inferior segment(s).    6. There is grade III diastolic dysfunction (severely elevated filling  pressure).    7. The right ventricle is normal in size, with low normal systolic function.   TELEMETRY reviewed by me: since in the PCU, has been in NSR with rate in the 80s with PVCs  EKG reviewed by me: Atrial fibrillation rate 121, old inferior infarct. Repeat EKG 2/28 shows NSR with 1* AV block, low voltage   Cardiac [facility] at [facility] 12/13/21 Coronary Angiography   Dominance: Right   Left Coronary:    - Left Main:  Large caliber vessel that bifurcates into the LAD and LCx.  There is 25% distal stenosis.     LAD:  - Heavily calcified, large caliber vessel that is subtotally occluded at  the early-mid  vessel just after the take-off of the first diagonal. D1 is  a moderate-large caliber and provides L>L collaterals to the OM territory.  D1 has moderate proximal and early mid-vessel disease. The first septal  provides L>R collaterals to the PLA and PLV.   LCx:  - Moderate caliber vessel that is chronically occluded in the proximal  vessel. OM1 is long and of small caliber, without significant disease. A  mid/distal portion of a high OM-2 vessel fills via L>L collaterals. The  mid/distal OM appears to have significant disease.   Right Coronary:    - Dominant vessel that is  chronically totally occluded at the proximal  vessel. The proximal vessel The distal vessel/PDA/PLV fills with L>R  collaterals.   Complications:  None  Blood loss:  Minimal  Specimen:  None  Device/Implants:  NA   Pre-procedure Dx:  STEMI  Post-procedure Dx:  CAD   ASSESSMENT AND PLAN:  The patient is a 66 year old male with a past medical history notable for CAD, hyperlipidemia, hypertension, COPD, who was brought in by EMS under IVC by his family members for not being able to take care of himself.  He was apparently living in very poor conditions, refusing to go to the hospital and was deemed a danger to himself. He was hospitalized at [facility] for 10 days and discharged on 2/17 after bilateral ureteral stent placements and cardiology f/u was recommended for discussion of complex PCI placement in the setting of CAD, sepsis, renal and respiratory failure. On arrival to [facility] ED 2/21 patient met sepsis criteria and in atrial fibrillation with RVR.  Cardiology is consulted for further assistance of his A-fib. Back in NSR on 2/28 on PO amiodarone. Not on anticoagulation d/t ongoing anemia.   #New onset atrial fibrillation with RVR, converted to NSR 2/28 on PO amiodarone #ESBL urosepsis & candida fungemia  #septic shock #COVID-19, asymptomatic Patient presents to [facility] ED 4 days after discharge from a 10-day hospitalization at [facility] for treatment of kidney stones. He was "feeling horrible" and met sepsis criteria on admission.  He was also in A-fib with rates peaking in the 140s, blood pressure too soft to start diltiazem or metoprolol.  His troponin is minimally elevated to 67-70 thought to be 2/2 demand ischemia from sepsis and his tachycardia.   -Agree with treatment of infection as per primary team -Off neo since 2/25. Midodrine weaned from 74m TID to 557mTID on 2/28. BP remains 9584-132Gystolic. -s/p Amio load and gtt. Continue PO amiodarone 20025mID x 7 days and then 200m47mce daily starting  3/4. -back in NSR on 2/28.  -continue to defer anticoagulation due to anemia  -TEE did not show evidence of vegetation to suggest endocarditis -CHA2DS2-VASc is ~3 (after further records available)   #anemia - [facility] for review show that patient needed multiple units of PRBCs and EGD 2/16 showed evidence of prior gastric ulcers but no acute bleeding - recurrent drops in Hgb thought to be due to acute illness - Hgb 7.1 this morning. Received 1 unit of PRBCs 2/27  #CAD with multivessel disease 12/13/21 #hyperlipidemia #hypertension EKG on admission shows A-fib with rate of 121, old inferior infarct to be [facility].  Remains chest pain-free.   -results from cardiac cath performed on 12/13/20 at [facility] code STEMI are now available in care everywhere -- it resulted with 25% distal LM disease, subtotally occluded mid-LAD at the  stent level, chronically occluded mid-Lcx and occluded proximal RCA  --intervention deferred aggressive secondary prevention recommended  -continue 16m ASA daily -continue atorvastatin 87m -On amlodipine at home, holding due to hypotension. Will likely transition from amlodipine to ACE/ARB for GDMT as his BP tolerates.  -would like to reestablish care with Dr. CaClayborn Bignessnd will need follow up 1-2 weeks after discharge.  #HFimprovedEF (LVEF 25-30% 12/13/21 --> 60-65% on 01/01/22)  -appears euvolemic from a HF standpoint -not on GDMT currently d/t hypotension  #Acute kidney injury Renal function gradually improving.  Appreciate nephrology input  Cardiology signing off. Please contact myself or Dr. PaSaralyn Pilarf needed.   This patient's plan of care was discussed and created with Dr. PaSaralyn Pilarnd he is in agreement.  Signed: LiTristan Schroeder PA-C 01/03/2022, 8:06 AM [facility]

## 2022-01-03 NOTE — Assessment & Plan Note (Addendum)
Patient transfused on a hemoglobin of 7.1 on 01/03/2022.  Last Hbg 8.7, stable. ?-- Check CBC in follow-up ?

## 2022-01-03 NOTE — Progress Notes (Signed)
Mobility Specialist - Progress Note ? ? ? 01/03/22 1400  ?Mobility  ?Activity Refused mobility  ? ?Pt refused session d/t fatigue and and PT session earlier. Will attempt session at another date and time. ? ?Merrily Brittle ?Mobility Specialist ?01/03/22, 2:16 PM ? ? ?

## 2022-01-04 DIAGNOSIS — R7881 Bacteremia: Secondary | ICD-10-CM | POA: Diagnosis not present

## 2022-01-04 DIAGNOSIS — N179 Acute kidney failure, unspecified: Secondary | ICD-10-CM | POA: Diagnosis not present

## 2022-01-04 DIAGNOSIS — R778 Other specified abnormalities of plasma proteins: Secondary | ICD-10-CM

## 2022-01-04 DIAGNOSIS — A419 Sepsis, unspecified organism: Secondary | ICD-10-CM | POA: Diagnosis not present

## 2022-01-04 DIAGNOSIS — B379 Candidiasis, unspecified: Secondary | ICD-10-CM | POA: Diagnosis not present

## 2022-01-04 DIAGNOSIS — N189 Chronic kidney disease, unspecified: Secondary | ICD-10-CM

## 2022-01-04 LAB — CBC WITH DIFFERENTIAL/PLATELET
Abs Immature Granulocytes: 0.05 10*3/uL (ref 0.00–0.07)
Basophils Absolute: 0 10*3/uL (ref 0.0–0.1)
Basophils Relative: 0 %
Eosinophils Absolute: 0.2 10*3/uL (ref 0.0–0.5)
Eosinophils Relative: 4 %
HCT: 27.9 % — ABNORMAL LOW (ref 39.0–52.0)
Hemoglobin: 9 g/dL — ABNORMAL LOW (ref 13.0–17.0)
Immature Granulocytes: 1 %
Lymphocytes Relative: 16 %
Lymphs Abs: 0.9 10*3/uL (ref 0.7–4.0)
MCH: 28.2 pg (ref 26.0–34.0)
MCHC: 32.3 g/dL (ref 30.0–36.0)
MCV: 87.5 fL (ref 80.0–100.0)
Monocytes Absolute: 0.4 10*3/uL (ref 0.1–1.0)
Monocytes Relative: 6 %
Neutro Abs: 4.1 10*3/uL (ref 1.7–7.7)
Neutrophils Relative %: 73 %
Platelets: 156 10*3/uL (ref 150–400)
RBC: 3.19 MIL/uL — ABNORMAL LOW (ref 4.22–5.81)
RDW: 17.8 % — ABNORMAL HIGH (ref 11.5–15.5)
WBC: 5.5 10*3/uL (ref 4.0–10.5)
nRBC: 0 % (ref 0.0–0.2)

## 2022-01-04 LAB — GLUCOSE, CAPILLARY
Glucose-Capillary: 111 mg/dL — ABNORMAL HIGH (ref 70–99)
Glucose-Capillary: 102 mg/dL — ABNORMAL HIGH (ref 70–99)
Glucose-Capillary: 136 mg/dL — ABNORMAL HIGH (ref 70–99)
Glucose-Capillary: 131 mg/dL — ABNORMAL HIGH (ref 70–99)

## 2022-01-04 LAB — ANTIFUNGAL AST 9 DRUG PANEL
Amphotericin B MIC: 1
Fluconazole Islt MIC: 16
Flucytosine MIC: 0.06
Itraconazole MIC: 1
Posaconazole MIC: 1
Voriconazole MIC: 0.25

## 2022-01-04 LAB — TYPE AND SCREEN
ABO/RH(D): O POS
Antibody Screen: NEGATIVE
Unit division: 0

## 2022-01-04 LAB — RENAL FUNCTION PANEL
Albumin: 2.2 g/dL — ABNORMAL LOW (ref 3.5–5.0)
Anion gap: 11 (ref 5–15)
BUN: 33 mg/dL — ABNORMAL HIGH (ref 8–23)
CO2: 25 mmol/L (ref 22–32)
Calcium: 8.4 mg/dL — ABNORMAL LOW (ref 8.9–10.3)
Chloride: 98 mmol/L (ref 98–111)
Creatinine, Ser: 2.63 mg/dL — ABNORMAL HIGH (ref 0.61–1.24)
GFR, Estimated: 27 mL/min — ABNORMAL LOW (ref >60)
Glucose, Bld: 100 mg/dL — ABNORMAL HIGH (ref 70–99)
Phosphorus: 3.6 mg/dL (ref 2.5–4.6)
Potassium: 4.2 mmol/L (ref 3.5–5.1)
Sodium: 134 mmol/L — ABNORMAL LOW (ref 135–145)

## 2022-01-04 LAB — CULTURE, BLOOD (ROUTINE X 2): Special Requests: ADEQUATE

## 2022-01-04 LAB — BPAM RBC
Blood Product Expiration Date: 202304042359
ISSUE DATE / TIME: 202303011705
Unit Type and Rh: 5100

## 2022-01-04 LAB — MAGNESIUM: Magnesium: 1.6 mg/dL — ABNORMAL LOW (ref 1.7–2.4)

## 2022-01-04 MED ORDER — MAGNESIUM SULFATE 2 GM/50ML IV SOLN
2.0000 g | Freq: Once | INTRAVENOUS | Status: AC
  Administered 2022-01-04: 2 g via INTRAVENOUS
  Filled 2022-01-04: qty 50

## 2022-01-04 MED ORDER — MAGNESIUM OXIDE -MG SUPPLEMENT 400 (240 MG) MG PO TABS
400.0000 mg | ORAL_TABLET | Freq: Two times a day (BID) | ORAL | Status: DC
  Administered 2022-01-04 – 2022-01-14 (×20): 400 mg via ORAL
  Filled 2022-01-04 (×20): qty 1

## 2022-01-04 NOTE — Progress Notes (Signed)
?  Progress Note ? ? ?PatientLyndel Burns XTK:[id number] DOB: 1956/01/04 DOA: 12/26/2021     8 ?DOS: the patient was seen and examined on 01/04/2022 ?  ? ?Assessment and Plan: ?* Septic shock ([place]) ?ESBL acute cystitis and Candida glabrata fungemia.  Patient will be on IV antibiotics through 3/12.  Unfortunately unable to set up home health with his living conditions.  He will have to stay here for the entire course of antibiotics.  Canceled Hickman catheter. ? ?Candida glabrata infection ?Continue Eraxis through 01/14/2022. ? ?Acute kidney injury superimposed on CKD ([place]) ?Acute kidney injury likely on chronic kidney disease stage IV.  Creatinine peaked at 3.86 on 12/28/2021.  Creatinine still 2.63 today.  Recent creatinine at [facility] on 12/22/2021 was 2.8. ? ?Anemia of chronic disease ?Hemoglobin dipped down to 7.1 yesterday.  Hemoglobin up to 9.0 today. ? ?Atrial fibrillation with RVR ([place]) ?Patient was on amiodarone IV and now on oral amiodarone.  They are holding off on anticoagulation at this point ? 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 ?Dr. Bernardo Heater recommended conservative management. ? ?COVID-19 virus infection ?Previous diagnosis at [facility] not on isolation at this point.  Asymptomatic ? ?Hypomagnesemia ?Replace IV magnesium again today and start oral magnesium. ? ?Elevated troponin ?Attribute patient's elevated troponin to sepsis. ? ? ?Depression ?Seen by psychiatry and no further recommendations. ? ?Hypotension ?Patient on low-dose midodrine. ? ? ? ? ? ? ? ?Subjective: Patient upset that we were unable to set up home health for him to go home with IV antibiotics.  Patient feels physically okay and wants to get out of the hospital soon as possible.  I informed him that he needs to stay in the hospital until 01/14/2022. ? ?Physical Exam: ?Vitals:  ? 01/03/22 2051 01/04/22 0013 01/04/22 0349 01/04/22 0818  ?BP: 118/81 117/70 120/88 118/85  ?Pulse: 86 78 80 72  ?Resp: 18 18 18 18   ?Temp: 98.1  ?F (36.7 ?C) 98.3 ?F (36.8 ?C) 98.5 ?F (36.9 ?C) 98.3 ?F (36.8 ?C)  ?TempSrc: Oral   Oral  ?SpO2: 99% 98% 97% 98%  ?Weight:      ?Height:      ? ?Physical Exam ?HENT:  ?   Head: Normocephalic.  ?   Mouth/Throat:  ?   Pharynx: No oropharyngeal exudate.  ?Eyes:  ?   General: Lids are normal.  ?   Conjunctiva/sclera: Conjunctivae normal.  ?Cardiovascular:  ?   Rate and Rhythm: Normal rate and regular rhythm.  ?   Heart sounds: Normal heart sounds, S1 normal and S2 normal.  ?Pulmonary:  ?   Breath sounds: Normal breath sounds. No decreased breath sounds, wheezing, rhonchi or rales.  ?Abdominal:  ?   Palpations: Abdomen is soft.  ?   Tenderness: There is no abdominal tenderness.  ?Musculoskeletal:  ?   Right lower leg: No swelling.  ?   Left lower leg: No swelling.  ?Skin: ?   General: Skin is warm.  ?   Comments: Small draining ulcer right lower extremity.  Multiple bruises, bilateral thighs and arms.  ?Neurological:  ?   Mental Status: He is alert.  ?  ? ?Data Reviewed: ?Hemoglobin up to 9.0.  Magnesium 1.6.  Creatinine 2.63 ? ?Family Communication: Updated brother on the phone ? ?Disposition: ?Status is: Inpatient ?Remains inpatient appropriate because:  ? ?Planned Discharge Destination: Home ? ?Author: ?Loletha Grayer, MD ?01/04/2022 3:30 PM ? ?For on call review [email or web address].  ? ?

## 2022-01-04 NOTE — Assessment & Plan Note (Addendum)
Continue oral magnesium supplement.   ?Magnesium 1.6 today, replaced. ?Check Mg at follow-up and replace PRN. ?

## 2022-01-04 NOTE — Care Management Important Message (Signed)
Important Message ? ?Patient Details  ?Name: Wayne Burns ?MRN: [id number] ?Date of Birth: 1956/09/15 ? ? ?Medicare Important Message Given:  Yes ? ?Reviewed Medicare IM with patient via room phone due to isolation status.  Copy of Medicare IM mailed to home address on file.   ? ? ?Dannette Barbara ?01/04/2022, 9:59 AM ?

## 2022-01-04 NOTE — Progress Notes (Signed)
[facility]  ROUNDING NOTE   Subjective:   Patient seen sitting at side of bed  Successfully able to I&O cath self yesterday  Stable renal function UOP 6.2L in 24 hours  03/01 0701 - 03/02 0700 In: 1694.3 [P.O.:720; Blood:544; IV Piggyback:430.3] Out: 6200 [Urine:6200] Lab Results  Component Value Date   CREATININE 2.63 (H) 01/04/2022   CREATININE 2.63 (H) 01/03/2022   CREATININE 2.70 (H) 01/02/2022     Objective:  Vital signs in last 24 hours:  Temp:  [98 F (36.7 C)-98.5 F (36.9 C)] 98.3 F (36.8 C) (03/02 0818) Pulse Rate:  [72-86] 72 (03/02 0818) Resp:  [18] 18 (03/02 0818) BP: (117-136)/(70-88) 118/85 (03/02 0818) SpO2:  [97 %-100 %] 98 % (03/02 0818)  Weight change:  Filed Weights   12/28/21 1500 12/31/21 1741 01/02/22 1118  Weight: 92.6 kg 91.4 kg 91.4 kg    Intake/Output: I/O last 3 completed shifts: In: 1694.3 [P.O.:720; Blood:544; IV Piggyback:430.3] Out: 8550 [Urine:8550]   Intake/Output this shift:  Total I/O In: 360 [P.O.:360] Out: 800 [Urine:800]  Physical Exam: General: NAD  Head: Normocephalic, atraumatic. Moist oral mucosal membranes  Eyes: Anicteric  Lungs:  Normal effort, Mild wheeze  Heart: Regular rate and rhythm  Abdomen:  Soft, nontender, obese  Extremities: No peripheral edema.  Neurologic: Nonfocal, moving all four extremities  Skin: No acute skin rash       Basic Metabolic Panel: Recent Labs  Lab 12/31/21 0308 12/31/21 0631 01/01/22 0529 01/01/22 0530 01/01/22 1714 01/02/22 0639 01/03/22 0637 01/04/22 0626  NA 130*   < >  --  134* 132* 135 133* 134*  K 3.4*   < >  --  3.9 4.0 4.3 4.6 4.2  CL 93*   < >  --  95* 95* 98 97* 98  CO2 27   < >  --  26 28 24 25 25   GLUCOSE 118*   < >  --  95 120* 95 169* 100*  BUN 53*   < >  --  48* 46* 40* 37* 33*  CREATININE 2.88*   < >  --  2.90* 2.78* 2.70* 2.63* 2.63*  CALCIUM 7.5*   < >  --  8.2* 7.9* 8.4* 8.1* 8.4*  MG 1.9  --  1.8  --   --  1.8 1.5* 1.6*  PHOS  3.9  --   --  4.3  --  3.7 3.6 3.6   < > = values in this interval not displayed.     Liver Function Tests: Recent Labs  Lab 12/29/21 0716 12/30/21 1062 12/31/21 0308 01/01/22 0530 01/02/22 0639 01/03/22 0637 01/04/22 0626  AST 18  --   --   --   --   --   --   ALT 35  --   --   --   --   --   --   ALKPHOS 157*  --   --   --   --   --   --   BILITOT 0.7  --   --   --   --   --   --   PROT 7.0  --   --   --   --   --   --   ALBUMIN 2.3*   < > 2.0* 2.2* 2.3* 2.0* 2.2*   < > = values in this interval not displayed.    No results for input(s): LIPASE, AMYLASE in the last 168 hours. No results for input(s):  AMMONIA in the last 168 hours.  CBC: Recent Labs  Lab 12/31/21 0308 12/31/21 0631 01/01/22 0529 01/01/22 1828 01/02/22 0639 01/03/22 0637 01/04/22 0626  WBC 6.5 7.9 6.4  --  5.6 4.8 5.5  NEUTROABS 5.2  --  4.9  --  3.8 3.7 4.1  HGB 6.2* 7.0* 6.7* 7.4* 7.8* 7.1* 9.0*  HCT 19.1* 21.9* 21.2* 22.7* 24.7* 22.2* 27.9*  MCV 86.0 86.2 88.0  --  89.5 89.2 87.5  PLT 114* 128* 128*  --  122* 135* 156     Cardiac Enzymes: No results for input(s): CKTOTAL, CKMB, CKMBINDEX, TROPONINI in the last 168 hours.   BNP: Invalid input(s): POCBNP  CBG: Recent Labs  Lab 01/03/22 1144 01/03/22 1625 01/03/22 2051 01/04/22 0757 01/04/22 1225  GLUCAP 136* 95 130* 111* 102*     Microbiology: Results for orders placed or performed during the hospital encounter of 12/26/21  Resp Panel by RT-PCR (Flu A&B, Covid)     Status: Abnormal   Collection Time: 12/26/21 10:33 PM   Specimen: Nasopharyngeal(NP) swabs in vial transport medium  Result Value Ref Range Status   SARS Coronavirus 2 by RT PCR POSITIVE (A) NEGATIVE Final    Comment: (NOTE) SARS-CoV-2 target nucleic acids are DETECTED.  The SARS-CoV-2 RNA is generally detectable in upper respiratory specimens during the acute phase of infection. Positive results are indicative of the presence of the identified virus, but do not  rule out bacterial infection or co-infection with other pathogens not detected by the test. Clinical correlation with patient history and other diagnostic information is necessary to determine patient infection status. The expected result is Negative.  Fact Sheet for Patients: EntrepreneurPulse.com.au  Fact Sheet for Healthcare Providers: IncredibleEmployment.be  This test is not yet approved or cleared by the [place] FDA and  has been authorized for detection and/or diagnosis of SARS-CoV-2 by FDA under an Emergency Use Authorization (EUA).  This EUA will remain in effect (meaning this test can be used) for the duration of  the COVID-19 declaration under Section 564(b)(1) of the A ct, 21 U.S.C. section 360bbb-3(b)(1), unless the authorization is terminated or revoked sooner.     Influenza A by PCR NEGATIVE NEGATIVE Final   Influenza B by PCR NEGATIVE NEGATIVE Final    Comment: (NOTE) The Xpert Xpress SARS-CoV-2/FLU/RSV plus assay is intended as an aid in the diagnosis of influenza from Nasopharyngeal swab specimens and should not be used as a sole basis for treatment. Nasal washings and aspirates are unacceptable for Xpert Xpress SARS-CoV-2/FLU/RSV testing.  Fact Sheet for Patients: EntrepreneurPulse.com.au  Fact Sheet for Healthcare Providers: IncredibleEmployment.be  This test is not yet approved or cleared by the [place] FDA and has been authorized for detection and/or diagnosis of SARS-CoV-2 by FDA under an Emergency Use Authorization (EUA). This EUA will remain in effect (meaning this test can be used) for the duration of the COVID-19 declaration under Section 564(b)(1) of the Act, 21 U.S.C. section 360bbb-3(b)(1), unless the authorization is terminated or revoked.  Performed at [facility], [address] [place], [place] [address]   Blood culture (routine x 2)      Status: None   Collection Time: 12/26/21 10:33 PM   Specimen: BLOOD  Result Value Ref Range Status   Specimen Description BLOOD LEFT ASSIST CONTROL  Final   Special Requests   Final    BOTTLES DRAWN AEROBIC AND ANAEROBIC Blood Culture adequate volume   Culture   Final    NO GROWTH 5  DAYS Performed at [facility], [address] [place], [place] [address]    Report Status 12/31/2021 FINAL  Final  Blood culture (routine x 2)     Status: Abnormal   Collection Time: 12/26/21 10:46 PM   Specimen: BLOOD  Result Value Ref Range Status   Specimen Description   Final    BLOOD RIGHT ANTECUBITAL Performed at [facility], [place] [address] [place], [place] [address]    Special Requests   Final    BOTTLES DRAWN AEROBIC AND ANAEROBIC Blood Culture adequate volume Performed at [facility], [address] [place], [place] [address]    Culture  Setup Time   Final    GRAM NEGATIVE RODS ANAEROBIC BOTTLE ONLY Organism ID to follow CRITICAL RESULT CALLED TO, READ BACK BY AND VERIFIED WITH: [facility] MITCHELL YJEHUD [id number] 12/27/21 HNM    Culture (A)  Final    ESCHERICHIA COLI Confirmed Extended Spectrum Beta-Lactamase Producer (ESBL).  In bloodstream infections from ESBL organisms, carbapenems are preferred over piperacillin/tazobactam. They are shown to have a lower risk of mortality.    Report Status 12/29/2021 FINAL  Final   Organism ID, Bacteria ESCHERICHIA COLI  Final      Susceptibility   Escherichia coli - MIC*    AMPICILLIN >=32 RESISTANT Resistant     CEFAZOLIN >=64 RESISTANT Resistant     CEFEPIME 16 RESISTANT Resistant     CEFTAZIDIME RESISTANT Resistant     CEFTRIAXONE >=64 RESISTANT Resistant     CIPROFLOXACIN <=0.25 SENSITIVE Sensitive     GENTAMICIN <=1 SENSITIVE Sensitive     IMIPENEM <=0.25 SENSITIVE Sensitive     TRIMETH/SULFA <=20 SENSITIVE Sensitive     AMPICILLIN/SULBACTAM 8 SENSITIVE Sensitive     PIP/TAZO <=4 SENSITIVE Sensitive     *  ESCHERICHIA COLI  Blood Culture ID Panel (Reflexed)     Status: Abnormal   Collection Time: 12/26/21 10:46 PM  Result Value Ref Range Status   Enterococcus faecalis NOT DETECTED NOT DETECTED Final   Enterococcus Faecium NOT DETECTED NOT DETECTED Final   Listeria monocytogenes NOT DETECTED NOT DETECTED Final   Staphylococcus species NOT DETECTED NOT DETECTED Final   Staphylococcus aureus (BCID) NOT DETECTED NOT DETECTED Final   Staphylococcus epidermidis NOT DETECTED NOT DETECTED Final   Staphylococcus lugdunensis NOT DETECTED NOT DETECTED Final   Streptococcus species NOT DETECTED NOT DETECTED Final   Streptococcus agalactiae NOT DETECTED NOT DETECTED Final   Streptococcus pneumoniae NOT DETECTED NOT DETECTED Final   Streptococcus pyogenes NOT DETECTED NOT DETECTED Final   A.calcoaceticus-baumannii NOT DETECTED NOT DETECTED Final   Bacteroides fragilis NOT DETECTED NOT DETECTED Final   Enterobacterales DETECTED (A) NOT DETECTED Final    Comment: Enterobacterales represent a large order of gram negative bacteria, not a single organism. CRITICAL RESULT CALLED TO, READ BACK BY AND VERIFIED WITH: DEVAN MITCHELL WYOVZC [id number] 12/27/21 HNM    Enterobacter cloacae complex NOT DETECTED NOT DETECTED Final   Escherichia coli DETECTED (A) NOT DETECTED Final    Comment: CRITICAL RESULT CALLED TO, READ BACK BY AND VERIFIED WITH: DEVAN MITCHELL PHARMD 1126 12/27/21 HNM    Klebsiella aerogenes NOT DETECTED NOT DETECTED Final   Klebsiella oxytoca NOT DETECTED NOT DETECTED Final   Klebsiella pneumoniae NOT DETECTED NOT DETECTED Final   Proteus species NOT DETECTED NOT DETECTED Final   Salmonella species NOT DETECTED NOT DETECTED Final   Serratia marcescens NOT DETECTED NOT DETECTED Final   Haemophilus influenzae NOT DETECTED NOT DETECTED Final  Neisseria meningitidis NOT DETECTED NOT DETECTED Final   Pseudomonas aeruginosa NOT DETECTED NOT DETECTED Final   Stenotrophomonas maltophilia NOT DETECTED  NOT DETECTED Final   Candida albicans NOT DETECTED NOT DETECTED Final   Candida auris NOT DETECTED NOT DETECTED Final   Candida glabrata NOT DETECTED NOT DETECTED Final   Candida krusei NOT DETECTED NOT DETECTED Final   Candida parapsilosis NOT DETECTED NOT DETECTED Final   Candida tropicalis NOT DETECTED NOT DETECTED Final   Cryptococcus neoformans/gattii NOT DETECTED NOT DETECTED Final   CTX-M ESBL DETECTED (A) NOT DETECTED Final    Comment: CRITICAL RESULT CALLED TO, READ BACK BY AND VERIFIED WITH: DEVAN MITCHELL PHARMD 1126 12/27/21 HNM (NOTE) Extended spectrum beta-lactamase detected. Recommend a carbapenem as initial therapy.      Carbapenem resistance IMP NOT DETECTED NOT DETECTED Final   Carbapenem resistance KPC NOT DETECTED NOT DETECTED Final   Carbapenem resistance NDM NOT DETECTED NOT DETECTED Final   Carbapenem resist OXA 48 LIKE NOT DETECTED NOT DETECTED Final   Carbapenem resistance VIM NOT DETECTED NOT DETECTED Final    Comment: Performed at [facility], [address], [place], [place] [address]  Urine Culture     Status: Abnormal   Collection Time: 12/27/21  9:59 AM   Specimen: Urine, Clean Catch  Result Value Ref Range Status   Specimen Description   Final    URINE, CLEAN CATCH Performed at [facility], [address], [place], [place] [address]    Special Requests   Final    NONE Performed at [facility], [address] [place], [place] [address]    Culture (A)  Final    <10,000 COLONIES/mL INSIGNIFICANT GROWTH Performed at [place] [address], [place], [place] [address]    Report Status 12/28/2021 FINAL  Final  Antifungal AST 9 Drug Panel     Status: None   Collection Time: 12/28/21  6:21 AM  Result Value Ref Range Status   Organism ID, Yeast Candida glabrata  Corrected    Comment: (NOTE) Identification performed by account, not confirmed by this laboratory. CORRECTED ON 03/02 AT 1136: PREVIOUSLY  REPORTED AS Preliminary report    Amphotericin B MIC 1.0 ug/mL  Final    Comment: (NOTE) Breakpoints have been established for only some organism-drug combinations as indicated. This test was developed and its performance characteristics determined by [facility]. It has not been cleared or approved by the Food and Drug Administration.    Anidulafungin MIC Comment  Final    Comment: (NOTE) 0.06 ug/mL Susceptible Breakpoints have been established for only some organism-drug combinations as indicated. This test was developed and its performance characteristics determined by [facility]. It has not been cleared or approved by the Food and Drug Administration.    Caspofungin MIC Comment  Final    Comment: (NOTE) 0.12 ug/mL Susceptible Breakpoints have been established for only some organism-drug combinations as indicated. This test was developed and its performance characteristics determined by [facility]. It has not been cleared or approved by the Food and Drug Administration.    Micafungin MIC Comment  Final    Comment: (NOTE) 0.016 ug/mL Susceptible Breakpoints have been established for only some organism-drug combinations as indicated. This test was developed and its performance characteristics determined by [facility]. It has not been cleared or approved by the Food and Drug Administration.    Posaconazole MIC 1.0 ug/mL  Final    Comment: (NOTE) Breakpoints have been established for only some organism-drug combinations  as indicated. This test was developed and its performance characteristics determined by [facility]. It has not been cleared or approved by the Food and Drug Administration.    Fluconazole Islt MIC 16.0 ug/mL  Final    Comment: (NOTE) Susceptible Dose Dependent Breakpoints have been established for only some organism-drug combinations as indicated. This test was developed and its performance characteristics determined by [facility]. It has not been cleared or  approved by the Food and Drug Administration.    Flucytosine MIC 0.06 ug/mL  Final    Comment: (NOTE) Breakpoints have been established for only some organism-drug combinations as indicated. This test was developed and its performance characteristics determined by [facility]. It has not been cleared or approved by the Food and Drug Administration.    Itraconazole MIC 1.0 ug/mL  Final    Comment: (NOTE) Breakpoints have been established for only some organism-drug combinations as indicated. This test was developed and its performance characteristics determined by [facility]. It has not been cleared or approved by the Food and Drug Administration.    Voriconazole MIC 0.25 ug/mL  Final    Comment: (NOTE) Breakpoints have been established for only some organism-drug combinations as indicated. This test was developed and its performance characteristics determined by [facility]. It has not been cleared or approved by the Food and Drug Administration. Performed At: [facility] [address] [place], [place] [id number] Rush Farmer MD PV:[id number]    Source CANDIDA GLABRATA FROM BLOOD  Final    Comment: Performed at [facility], [place] [address], [place], [place] [address]  MRSA Next Gen by PCR, Nasal     Status: Abnormal   Collection Time: 12/28/21  3:10 PM   Specimen: Nasal Mucosa; Nasal Swab  Result Value Ref Range Status   MRSA by PCR Next Gen DETECTED (A) NOT DETECTED Final    Comment: RESULT CALLED TO, READ BACK BY AND VERIFIED WITH: GINA SHANNON 1701 12/28/21 MU (NOTE) The GeneXpert MRSA Assay (FDA approved for NASAL specimens only), is one component of a comprehensive MRSA colonization surveillance program. It is not intended to diagnose MRSA infection nor to guide or monitor treatment for MRSA infections. Test performance is not FDA approved in patients less than 5 years old. Performed at [facility], [address] [place], [place] [address]    CULTURE, BLOOD (ROUTINE X 2) w Reflex to ID Panel     Status: None   Collection Time: 12/29/21  6:21 AM   Specimen: BLOOD  Result Value Ref Range Status   Specimen Description BLOOD RIGHT [facility]  Final   Special Requests   Final    BOTTLES DRAWN AEROBIC AND ANAEROBIC Blood Culture adequate volume   Culture   Final    NO GROWTH 5 DAYS Performed at [facility], [address] [place], [place] [address]    Report Status 01/03/2022 FINAL  Final  CULTURE, BLOOD (ROUTINE X 2) w Reflex to ID Panel     Status: Abnormal (Preliminary result)   Collection Time: 12/29/21  6:21 AM   Specimen: BLOOD  Result Value Ref Range Status   Specimen Description   Final    BLOOD RIGHT HAND Performed at [facility], [address], [place], [place] [address]    Special Requests   Final    IN PEDIATRIC BOTTLE Blood Culture adequate volume Performed at [facility], [address] [place],  [address]    Culture  Setup Time   Final  YEAST IN PEDIATRIC BOTTLE Organism ID to follow CRITICAL RESULT CALLED TO, READ BACK BY AND VERIFIED WITH: BRANDON BEERS @1310  12/30/21 SCS    Culture (A)  Final    CANDIDA GLABRATA Sent to [place] for further susceptibility testing. Performed at [facility], [place] [address], [place], [place] [address]    Report Status PENDING  Incomplete  Blood Culture ID Panel (Reflexed)     Status: Abnormal   Collection Time: 12/29/21  6:21 AM  Result Value Ref Range Status   Enterococcus faecalis NOT DETECTED NOT DETECTED Final   Enterococcus Faecium NOT DETECTED NOT DETECTED Final   Listeria monocytogenes NOT DETECTED NOT DETECTED Final   Staphylococcus species NOT DETECTED NOT DETECTED Final   Staphylococcus aureus (BCID) NOT DETECTED NOT DETECTED Final   Staphylococcus epidermidis NOT DETECTED NOT DETECTED Final   Staphylococcus lugdunensis NOT DETECTED NOT DETECTED Final   Streptococcus species NOT DETECTED NOT DETECTED Final    Streptococcus agalactiae NOT DETECTED NOT DETECTED Final   Streptococcus pneumoniae NOT DETECTED NOT DETECTED Final   Streptococcus pyogenes NOT DETECTED NOT DETECTED Final   A.calcoaceticus-baumannii NOT DETECTED NOT DETECTED Final   Bacteroides fragilis NOT DETECTED NOT DETECTED Final   Enterobacterales NOT DETECTED NOT DETECTED Final   Enterobacter cloacae complex NOT DETECTED NOT DETECTED Final   Escherichia coli NOT DETECTED NOT DETECTED Final   Klebsiella aerogenes NOT DETECTED NOT DETECTED Final   Klebsiella oxytoca NOT DETECTED NOT DETECTED Final   Klebsiella pneumoniae NOT DETECTED NOT DETECTED Final   Proteus species NOT DETECTED NOT DETECTED Final   Salmonella species NOT DETECTED NOT DETECTED Final   Serratia marcescens NOT DETECTED NOT DETECTED Final   Haemophilus influenzae NOT DETECTED NOT DETECTED Final   Neisseria meningitidis NOT DETECTED NOT DETECTED Final   Pseudomonas aeruginosa NOT DETECTED NOT DETECTED Final   Stenotrophomonas maltophilia NOT DETECTED NOT DETECTED Final   Candida albicans NOT DETECTED NOT DETECTED Final   Candida auris NOT DETECTED NOT DETECTED Final   Candida glabrata DETECTED (A) NOT DETECTED Final    Comment: CRITICAL RESULT CALLED TO, READ BACK BY AND VERIFIED WITH: BRANDON BEERS @1310  12/30/21 SCS    Candida krusei NOT DETECTED NOT DETECTED Final   Candida parapsilosis NOT DETECTED NOT DETECTED Final   Candida tropicalis NOT DETECTED NOT DETECTED Final   Cryptococcus neoformans/gattii NOT DETECTED NOT DETECTED Final    Comment: Performed at [facility], [address] [place], [place] [address]  CULTURE, BLOOD (ROUTINE X 2) w Reflex to ID Panel     Status: None (Preliminary result)   Collection Time: 12/31/21  3:08 AM   Specimen: BLOOD  Result Value Ref Range Status   Specimen Description BLOOD BLOOD RIGHT HAND  Final   Special Requests   Final    BOTTLES DRAWN AEROBIC AND ANAEROBIC Blood Culture adequate volume   Culture    Final    NO GROWTH 4 DAYS Performed at [facility], [address] [place], [place] [address]    Report Status PENDING  Incomplete  CULTURE, BLOOD (ROUTINE X 2) w Reflex to ID Panel     Status: None (Preliminary result)   Collection Time: 12/31/21  3:08 AM   Specimen: BLOOD  Result Value Ref Range Status   Specimen Description BLOOD BLOOD LEFT HAND  Final   Special Requests   Final    BOTTLES DRAWN AEROBIC AND ANAEROBIC Blood Culture adequate volume   Culture   Final    NO GROWTH  4 DAYS Performed at [facility], [address] [place], [place] [address]    Report Status PENDING  Incomplete    Coagulation Studies: No results for input(s): LABPROT, INR in the last 72 hours.  Urinalysis: No results for input(s): COLORURINE, LABSPEC, PHURINE, GLUCOSEU, HGBUR, BILIRUBINUR, KETONESUR, PROTEINUR, UROBILINOGEN, NITRITE, LEUKOCYTESUR in the last 72 hours.  Invalid input(s): APPERANCEUR     Imaging: [place] EKG SITE RITE  Result Date: 01/03/2022 If Site Rite image not attached, placement could not be confirmed due to current cardiac rhythm.    Medications:    sodium chloride Stopped (12/30/21 1542)   sodium chloride Stopped (01/02/22 0910)   anidulafungin 100 mg (01/03/22 1451)   ertapenem 1,000 mg (01/04/22 2751)    amiodarone  200 mg Oral BID   [START ON 01/06/2022] amiodarone  200 mg Oral Daily   atorvastatin  80 mg Oral Daily   Chlorhexidine Gluconate Cloth  6 each Topical Daily   heparin  5,000 Units Subcutaneous Q12H   insulin aspart  0-15 Units Subcutaneous TID WC   insulin aspart  0-5 Units Subcutaneous QHS   levalbuterol  2 puff Inhalation Q8H   melatonin  5 mg Oral QHS   midodrine  5 mg Oral TID WC   multivitamin with minerals  1 tablet Oral Daily   pantoprazole  40 mg Oral BID   pneumococcal 23 valent vaccine  0.5 mL Intramuscular Tomorrow-1000   sertraline  100 mg Oral BID   sodium chloride flush  3 mL Intravenous Q12H   sodium chloride flush   3 mL Intravenous Q12H   tamsulosin  0.4 mg Oral Daily   tiotropium  1 capsule Inhalation Daily   traZODone  50 mg Oral QHS   sodium chloride, acetaminophen **OR** acetaminophen, menthol-cetylpyridinium, ondansetron **OR** ondansetron (ZOFRAN) IV, oxybutynin, oxyCODONE, sodium chloride flush  Assessment/ Plan:  Mr. Wayne Burns is a 66 y.o.  male with past medical conditions including COPD and hypertension , who was admitted to [facility] on 12/26/2021 for Atrial fibrillation with rapid ventricular response ([place]) [I48.91] Acute cystitis without hematuria [N30.00] AKI (acute kidney injury) ([place]) [N17.9] Septic shock ([place]) [A41.9, R65.21] Sepsis ([place]) [A41.9] Sepsis, due to unspecified organism, unspecified whether acute organ dysfunction present ([place]) [A41.9]   Acute kidney injury with chronic kidney disease stage IV.  Baseline appears to be 2.8 with GFR 25 on 12/22/2021.  AKI may be due to possible obstruction from migrating stent recently placed at [facility].    Renal function stable. Will arrange follow up appt with our office at discharge.   Lab Results  Component Value Date   CREATININE 2.63 (H) 01/04/2022   CREATININE 2.63 (H) 01/03/2022   CREATININE 2.70 (H) 01/02/2022    Intake/Output Summary (Last 24 hours) at 01/04/2022 1229 Last data filed at 01/04/2022 0900 Gross per 24 hour  Intake 904 ml  Output 5200 ml  Net -4296 ml    2.  Hypotension.  Currently on amiodarone drip with midodrine schedule III times daily.  Home regimen of amlodipine currently held  Blood pressure 108/59  3.  Acute metabolic acidosis.  Serum bicarbonate also fluctuating.  Resolved with sodium bicarb infusion  4. Septic shock with ESBL acute cystitis and candida glabrata fungemia. Will require antibiotics after discharge. Due to kidney disease, would prefer [facility] or other tunneled access for administration.    LOS: [place] 3/2/202312:29 PM

## 2022-01-04 NOTE — Evaluation (Signed)
Physical Therapy Evaluation ?Patient Details ?Name: Wayne Burns ?MRN: [id number] ?DOB: 12/19/1955 ?Today's Date: 01/04/2022 ? ?History of Present Illness ? Patient is a 66 year old male with COPD, hypertension with bipolar who comes in under IVC due to poor living conditions, urinating and defecating on self and refusing to go to the hospital. Patient recently left [facility] AMA. Patient currently admitted for septic shock. Recieved a cystoscopy with stent replacement (Right) yesterday 01/03/22. ?  ?Clinical Impression ? Physical Therapy Evaluation completed on this date. Patient tolerated session well and was agreeable to treatment. Patient demonstrated impulsive behavior, however was cooperative with all tasks asked of him. Upon entry, patient was sitting EOB finishing his breakfast. Patient states he lives alone in a mobile home with his brother right next door. He has a SPC that he uses in the colder months from a previous knee injury, however is independent with all ADLs.  ? ?Patient demonstrated [facility] for BUE and BLE strength, now formally assessed, however patient demonstrated significant ease with bed mobility, and functional transfers. Patient was able to demonstrate independence with tolieting. Patient requested to be in bathroom alone with door closed. Chief Strategy Officer stood outside Restaurant manager, fast food. Patient was able to tolerated ambulating around the nurses station at SBA/SUP for safety with no AD. Patient demonstrated an antalgic gait, however states this is his baseline- No LOB noted. Overall Supervision/SBA required throughout session due to patient's impulsive behavior, however no skilled physical therapy needs required at this time. Signing off.  ?   ? ?Recommendations for follow up therapy are one component of a multi-disciplinary discharge planning process, led by the attending physician.  Recommendations may be updated based on patient status, additional functional criteria and insurance authorization. ? ?Follow Up  Recommendations No PT follow up ? ?  ?Assistance Recommended at Discharge PRN  ?Patient can return home with the following ?   ? ?  ?Equipment Recommendations None recommended by PT (at this time)  ?Recommendations for Other Services ?    ?  ?Functional Status Assessment Patient has had a recent decline in their functional status and demonstrates the ability to make significant improvements in function in a reasonable and predictable amount of time.  ? ?  ?Precautions / Restrictions Precautions ?Precautions: Fall ?Restrictions ?Weight Bearing Restrictions: No  ? ?  ? ?Mobility ? Bed Mobility ?Overal bed mobility: Independent ?  ?  ?  ?  ?  ?  ?  ?Patient Response: Impulsive, Cooperative ? ?Transfers ?Overall transfer level: Modified independent ?Equipment used: None ?Transfers: Sit to/from Stand ?  ?  ?  ?  ?  ?  ?General transfer comment: completed sit to stand from EOB at Mod I with increased use of UE from counter to pull to standing, completed Mod I from toliet (patient would not let therapist in bathroom and requested door closed)- theraipst stood outside door for safety completed Mod I ?  ? ?Ambulation/Gait ?Ambulation/Gait assistance: Supervision (SBA) ?Gait Distance (Feet): 180 Feet ?Assistive device: None ?Gait Pattern/deviations: Step-through pattern, Decreased step length - right, Decreased step length - left, Antalgic ?Gait velocity: decreased ?  ?  ?General Gait Details: Antalgic gait at baseline, No LOB noted ? ?Stairs ?  ?  ?  ?  ?  ? ?Wheelchair Mobility ?  ? ?Modified Rankin (Stroke Patients Only) ?  ? ?  ? ?Balance Overall balance assessment: Modified Independent ?  ?  ?  ?  ?  ?  ?  ?  ?  ?  ?  ?  ?  ?  ?  ?  ?  ?  ?   ? ? ? ?  Pertinent Vitals/Pain Pain Assessment ?Pain Assessment: No/denies pain  ? ? ?Home Living Family/patient expects to be discharged to:: Private residence ?Living Arrangements: Alone ?Available Help at Discharge: Family;Other (Comment) ("brother lives 100 yards away") ?Type of  Home: Mobile home ?Home Access: Stairs to enter ?Entrance Stairs-Rails: Can reach both;Right;Left ?Entrance Stairs-Number of Steps: 5 ?  ?Home Layout: One level ?Home Equipment: Kasandra Knudsen - single point ?   ?  ?Prior Function Prior Level of Function : Independent/Modified Independent ?  ?  ?  ?  ?  ?  ?Mobility Comments: uses cane when its cold out for his R leg from a previous surgery ?ADLs Comments: Independent ?  ? ? ?Hand Dominance  ? Dominant Hand: Right ? ?  ?Extremity/Trunk Assessment  ? Upper Extremity Assessment ?Upper Extremity Assessment: Overall WFL for tasks assessed ?  ? ?Lower Extremity Assessment ?Lower Extremity Assessment: Overall WFL for tasks assessed ?  ? ?   ?Communication  ? Communication: No difficulties  ?Cognition Arousal/Alertness: Awake/alert ?Behavior During Therapy: [facility] for tasks assessed/performed ?Overall Cognitive Status: Within Functional Limits for tasks assessed ?  ?  ?  ?  ?  ?  ?  ?  ?  ?  ?  ?  ?  ?  ?  ?  ?General Comments: A&Ox3- self, location, situation ?  ?  ? ?  ?General Comments   ? ?  ?Exercises Other Exercises ?Other Exercises: Patient educated on role of PT in acute setting  ? ?Assessment/Plan  ?  ?PT Assessment Patient does not need any further PT services  ?PT Problem List   ? ?   ?  ?PT Treatment Interventions     ? ?PT Goals (Current goals can be found in the Care Plan section)  ?Acute Rehab PT Goals ?Patient Stated Goal: to go home ?PT Goal Formulation: With patient ?Time For Goal Achievement: 01/18/22 ?Potential to Achieve Goals: Good ? ?  ?Frequency   ?  ? ? ?Co-evaluation   ?  ?  ?  ?  ? ? ?  ?AM-PAC PT "6 Clicks" Mobility  ?Outcome Measure Help needed turning from your back to your side while in a flat bed without using bedrails?: None ?Help needed moving from lying on your back to sitting on the side of a flat bed without using bedrails?: None ?Help needed moving to and from a bed to a chair (including a wheelchair)?: None ?Help needed standing up from a chair  using your arms (e.g., wheelchair or bedside chair)?: None ?Help needed to walk in hospital room?: None ?Help needed climbing 3-5 steps with a railing? : None ?6 Click Score: 24 ? ?  ?End of Session Equipment Utilized During Treatment: Gait belt ?Activity Tolerance: Patient tolerated treatment well ?Patient left: in bed;with call bell/phone within reach ?Nurse Communication: Mobility status ?PT Visit Diagnosis: Unsteadiness on feet (R26.81) ?  ? ?Time: 7116-5790 ?PT Time Calculation (min) (ACUTE ONLY): 25 min ? ? ?Charges:   PT Evaluation ?$PT Eval Low Complexity: 1 Low ?PT Treatments ?$Gait Training: 8-22 mins ?  ?   ? ?Iva Boop, PT  ?01/04/22. 10:01 AM ? ? ?

## 2022-01-04 NOTE — Progress Notes (Signed)
Patient is refusing to put on the tele monitor. Reinforced importance of the cardiac monitor, but still refuses. Denies chest pain, difficulty of breathing or palpitations. Appears comfortable on bed, watching television. Will continue to monitor. ?

## 2022-01-04 NOTE — Assessment & Plan Note (Addendum)
Completed course of Eraxis today 01/14/2022. ?

## 2022-01-04 NOTE — TOC Progression Note (Signed)
Transition of Care (TOC) - Progression Note  ? ? ?Patient Details  ?Name: Allante Rasmussen ?MRN: [id number] ?Date of Birth: 16-Nov-1955 ? ?Transition of Care (TOC) CM/SW Contact  ?Alberteen Sam, LCSW ?Phone Number: ?01/04/2022, 1:49 PM ? ?Clinical Narrative:    ? ?CSW notes patient was [facility] with [facility] [place] [facility] for home iv antibiotics.  ? ?However, per patient's brother patient's driveway in inaccessible for home infusion or HH to get to his home. He also reports that inside the home is covered in feces. This home environment does not support safe discharge with iv antibiotics.  ?Patient has continuously refused to go to SNF facility for iv antibiotics. Patient has been deemed alert and oriented x4 and able to make his own decisions per psych eval.  ? ?Updates of above have been relayed to [place] social worker Samella Parr [phone]. ? ?TOC supervisor made aware.  ? ?Potential plan for patient to stay until 3/12 to finish iv antibiotics, and have PO Bactrim for 2 days until 3/14.  ? ? ?Expected Discharge Plan: Home/Self Care ?Barriers to Discharge: Continued Medical Work up ? ?Expected Discharge Plan and Services ?Expected Discharge Plan: Home/Self Care ?  ?Discharge Planning Services: CM Consult ?  ?Living arrangements for the past 2 months: Mobile Home ?                ?DME Arranged: N/A ?DME Agency: NA ?  ?  ?  ?HH Arranged: NA, Patient Refused HH ?[place] Agency: NA ?  ?  ?  ? ? ?Social Determinants of Health (SDOH) Interventions ?  ? ?Readmission Risk Interventions ?No flowsheet data found. ? ?

## 2022-01-05 ENCOUNTER — Inpatient Hospital Stay: Payer: Medicare Other

## 2022-01-05 ENCOUNTER — Ambulatory Visit: Admit: 2022-01-05 | Payer: MEDICARE

## 2022-01-05 DIAGNOSIS — B379 Candidiasis, unspecified: Secondary | ICD-10-CM | POA: Diagnosis not present

## 2022-01-05 DIAGNOSIS — N179 Acute kidney failure, unspecified: Secondary | ICD-10-CM | POA: Diagnosis not present

## 2022-01-05 DIAGNOSIS — D638 Anemia in other chronic diseases classified elsewhere: Secondary | ICD-10-CM | POA: Diagnosis not present

## 2022-01-05 DIAGNOSIS — A419 Sepsis, unspecified organism: Secondary | ICD-10-CM | POA: Diagnosis not present

## 2022-01-05 DIAGNOSIS — R600 Localized edema: Secondary | ICD-10-CM

## 2022-01-05 DIAGNOSIS — E871 Hypo-osmolality and hyponatremia: Secondary | ICD-10-CM

## 2022-01-05 LAB — CBC WITH DIFFERENTIAL/PLATELET
Abs Immature Granulocytes: 0.04 10*3/uL (ref 0.00–0.07)
Basophils Absolute: 0 10*3/uL (ref 0.0–0.1)
Basophils Relative: 0 %
Eosinophils Absolute: 0.2 10*3/uL (ref 0.0–0.5)
Eosinophils Relative: 4 %
HCT: 27.2 % — ABNORMAL LOW (ref 39.0–52.0)
Hemoglobin: 8.7 g/dL — ABNORMAL LOW (ref 13.0–17.0)
Immature Granulocytes: 1 %
Lymphocytes Relative: 15 %
Lymphs Abs: 0.9 10*3/uL (ref 0.7–4.0)
MCH: 28.2 pg (ref 26.0–34.0)
MCHC: 32 g/dL (ref 30.0–36.0)
MCV: 88 fL (ref 80.0–100.0)
Monocytes Absolute: 0.5 10*3/uL (ref 0.1–1.0)
Monocytes Relative: 8 %
Neutro Abs: 4.3 10*3/uL (ref 1.7–7.7)
Neutrophils Relative %: 72 %
Platelets: 152 10*3/uL (ref 150–400)
RBC: 3.09 MIL/uL — ABNORMAL LOW (ref 4.22–5.81)
RDW: 17.7 % — ABNORMAL HIGH (ref 11.5–15.5)
WBC: 5.9 10*3/uL (ref 4.0–10.5)
nRBC: 0 % (ref 0.0–0.2)

## 2022-01-05 LAB — RENAL FUNCTION PANEL
Albumin: 2.2 g/dL — ABNORMAL LOW (ref 3.5–5.0)
Anion gap: 9 (ref 5–15)
BUN: 31 mg/dL — ABNORMAL HIGH (ref 8–23)
CO2: 25 mmol/L (ref 22–32)
Calcium: 8.5 mg/dL — ABNORMAL LOW (ref 8.9–10.3)
Chloride: 100 mmol/L (ref 98–111)
Creatinine, Ser: 2.62 mg/dL — ABNORMAL HIGH (ref 0.61–1.24)
GFR, Estimated: 27 mL/min — ABNORMAL LOW (ref >60)
Glucose, Bld: 101 mg/dL — ABNORMAL HIGH (ref 70–99)
Phosphorus: 3.8 mg/dL (ref 2.5–4.6)
Potassium: 4.3 mmol/L (ref 3.5–5.1)
Sodium: 134 mmol/L — ABNORMAL LOW (ref 135–145)

## 2022-01-05 LAB — CULTURE, BLOOD (ROUTINE X 2)
Culture: NO GROWTH
Culture: NO GROWTH
Special Requests: ADEQUATE
Special Requests: ADEQUATE

## 2022-01-05 LAB — MAGNESIUM: Magnesium: 2 mg/dL (ref 1.7–2.4)

## 2022-01-05 LAB — GLUCOSE, CAPILLARY
Glucose-Capillary: 97 mg/dL (ref 70–99)
Glucose-Capillary: 131 mg/dL — ABNORMAL HIGH (ref 70–99)
Glucose-Capillary: 96 mg/dL (ref 70–99)
Glucose-Capillary: 130 mg/dL — ABNORMAL HIGH (ref 70–99)

## 2022-01-05 MED ORDER — ENOXAPARIN SODIUM 40 MG/0.4ML IJ SOSY
40.0000 mg | PREFILLED_SYRINGE | INTRAMUSCULAR | Status: DC
  Administered 2022-01-05 – 2022-01-06 (×2): 40 mg via SUBCUTANEOUS
  Filled 2022-01-05 (×2): qty 0.4

## 2022-01-05 MED ORDER — TORSEMIDE 20 MG PO TABS
20.0000 mg | ORAL_TABLET | Freq: Once | ORAL | Status: AC
  Administered 2022-01-05: 20 mg via ORAL
  Filled 2022-01-05: qty 1

## 2022-01-05 NOTE — Assessment & Plan Note (Addendum)
Resolved. Sodium 134 today. ?Repeat BMP at follow-up in 1 to 2 weeks ?

## 2022-01-05 NOTE — Progress Notes (Signed)
?  Progress Note ? ? ?PatientTeagan Burns JHE:[id number] DOB: 08/19/1956 DOA: 12/26/2021     9 ?DOS: the patient was seen and examined on 01/05/2022 ?  ? ? ?Assessment and Plan: ?* Septic shock ([place]) ?ESBL E. coli acute cystitis and Candida glabrata fungemia.  Patient will be on IV antibiotics through 3/12.  Unfortunately unable to set up home health.  He is agreeable to stay here for the entire course of antibiotics. ? ?Candida glabrata infection ?Continue Eraxis through 01/14/2022. ? ?Acute kidney injury superimposed on CKD ([place]) ?Acute kidney injury likely on chronic kidney disease stage IV.  Creatinine peaked at 3.86 on 12/28/2021.  Creatinine still 2.62 today.  Recent creatinine at [facility] on 12/22/2021 was 2.8.  Will give 1 dose of torsemide today for lower extremity swelling. ? ?Anemia of chronic disease ?Patient transfused on a hemoglobin of 7.1 on 01/03/2022.  Hemoglobin 8.7 today. ? ?Atrial fibrillation with RVR ([place]) ?Patient was on amiodarone IV and now on oral amiodarone.  Cardiology holding off on anticoagulation. ? ?Displacement of ureteral stent ([place]) ?Patient had right stent malposition.  Dr. Bernardo Heater did stent repositioning on 01/02/2022. ? ?COVID-19 virus infection ?Previous diagnosis at [facility] not on isolation at this point.  Asymptomatic ? ?Hypomagnesemia ?Continue oral magnesium.  Magnesium in the normal range today ? ?Elevated troponin ?Elevated troponin to sepsis. ? ? ?Depression ?Seen by psychiatry and no further recommendations. ? ?Hyponatremia ?Sodium 134 ? ?Lower extremity edema ?Ultrasound negative for DVT.  We will give 1 dose of torsemide and check creatinine tomorrow. ? ?Hypotension ?Patient on low-dose midodrine. ? ? ? ? ? ? ? ?Subjective: Patient now agreeable to undergo entire treatment course here in the hospital.  Unfortunately unable to set up home health for him.  He would require IV antibiotics through 01/14/2022. ? ?Physical Exam: ?Vitals:  ? 01/05/22 0011 01/05/22 0402 01/05/22 0935 01/05/22  1200  ?BP: 105/78 104/77 119/75 115/72  ?Pulse: 77 72 76 78  ?Resp: 18 18 16 20   ?Temp: 98.4 ?F (36.9 ?C) 97.8 ?F (36.6 ?C) (!) 97.3 ?F (36.3 ?C) 97.9 ?F (36.6 ?C)  ?TempSrc:      ?SpO2: 96% 95% 96% 97%  ?Weight:      ?Height:      ? ?Physical Exam ?HENT:  ?   Head: Normocephalic.  ?   Mouth/Throat:  ?   Pharynx: No oropharyngeal exudate.  ?Eyes:  ?   General: Lids are normal.  ?   Conjunctiva/sclera: Conjunctivae normal.  ?Cardiovascular:  ?   Rate and Rhythm: Normal rate and regular rhythm.  ?   Heart sounds: Normal heart sounds, S1 normal and S2 normal.  ?Pulmonary:  ?   Breath sounds: No decreased breath sounds, wheezing, rhonchi or rales.  ?Abdominal:  ?   Palpations: Abdomen is soft.  ?   Tenderness: There is no abdominal tenderness.  ?Musculoskeletal:  ?   Right lower leg: Swelling present.  ?   Left lower leg: Swelling present.  ?Skin: ?   General: Skin is warm.  ?   Findings: No rash.  ?Neurological:  ?   Mental Status: He is alert and oriented to person, place, and time.  ?  ? ?Data Reviewed: ?Creatinine 2.62 today, sodium 134, hemoglobin 8.7 ? ?Family Communication: Updated brother yesterday ? ?Disposition: ?Status is: Inpatient ?Remains inpatient appropriate because: Requires IV antibiotics through 01/14/2022 ? ?Planned Discharge Destination: Home ? ? ?Author: ?Loletha Grayer, MD ?01/05/2022 3:12 PM ? ?For on call review [email or web address].  ? ?

## 2022-01-05 NOTE — Progress Notes (Signed)
Nutrition Follow-up ? ?DOCUMENTATION CODES:  ? ?Obesity unspecified ? ?INTERVENTION:  ? ?-Continue double protein portions with meals ?-Continue MVI with minerals daily ? ?NUTRITION DIAGNOSIS:  ? ?Increased nutrient needs related to acute illness (+COVID-19) as evidenced by estimated needs. ? ?Ongoing ? ?GOAL:  ? ?Patient will meet greater than or equal to 90% of their needs ? ?Progressing  ? ?MONITOR:  ? ?PO intake, Supplement acceptance, Labs, Weight trends, Skin, I & O's ? ?REASON FOR ASSESSMENT:  ? ?Malnutrition Screening Tool ?  ? ?ASSESSMENT:  ? ?Wayne Burns is a 66 y.o. male seen in the emergency room today for urinary tract infection, sepsis.  Patient is unable to give a clear HPI he is tachycardic and review of systems as well is difficult to obtain.  Initially brought by EMS today patient was IVC by family members for not being able to take care of himself and living in poor conditions refusing to go to the hospital and was deemed a danger to himself.  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  Patient on my evaluation is alert oriented but tachycardic perseverates with answering questions and is not able to clearly give me history.  I would even say patient is mildly disoriented.  When asked about his heart history he initially says no then he says he had blockages.  When asked how he knows about the blockages he said he had been tested and I said did you ever have a cardiac cath he said yes.  I am unable to verify this information from [facility] epic system due to lack of access.  Patient appears disheveled unkempt head soiled himself with stool and urine on initial evaluation.  The emergency room patient meets sepsis criteria. ? ?2/27- s/p TEE- no evidence of vegetation ? ?Reviewed I/O's: -1.5 L x 24 hours and -4.6 L since admission ? ?UOP: 2.1 L x 24 hours ? ?Per MD notes, pt with ESBL, acute cystis, and candida glabrata  fungemia. Pt will require IV antibiotics until 01/14/22 and is unable to complete this at home due to living conditions.  ?  ?Pt on a carb modified diet. Noted meal completions 50-100%. ? ?Wt has been stable since admission.  ? ?Labs reviewed: Na: 134, CBGS: 95-136 (inpatient orders for glycemic control are 0-5 units insulin aspart daily at bedtime).   ? ?Diet Order:   ?Diet Order   ? ?       ?  Diet Carb Modified Fluid consistency: Thin; Room service appropriate? Yes  Diet effective now       ?  ? ?  ?  ? ?  ? ? ?EDUCATION NEEDS:  ? ?No education needs have been identified at this time ? ?Skin:  Skin Assessment: Skin Integrity Issues: ?Skin Integrity Issues:: Incisions ?Incisions: closed penis ?Other: wound to rt foot ? ?Last BM:  01/03/22 ? ?Height:  ? ?Ht Readings from Last 1 Encounters:  ?01/02/22 5\' 9"  (1.753 m)  ? ? ?Weight:  ? ?Wt Readings from Last 1 Encounters:  ?01/02/22 91.4 kg  ? ? ?Ideal Body Weight:  72.7 kg ? ?BMI:  Body mass index is 29.76 kg/m?. ? ?Estimated Nutritional Needs:  ? ?Kcal:  2200-2400 ? ?Protein:  110-125 grams ? ?Fluid:  > 2 L ? ? ? ?Loistine Chance, RD, LDN, CDCES ?Registered Dietitian II ?Certified Diabetes Care and Education Specialist ?Please refer to [facility] for RD and/or RD  on-call/weekend/after hours pager  ?

## 2022-01-05 NOTE — Progress Notes (Signed)
[facility]  ROUNDING NOTE   Subjective:   Patient seen sitting at side of bed,  no complaints at this time,  no lower extremity edema  Creatinine appears stable.  03/02 0701 - 03/03 0700 In: 590.7 [P.O.:360; I.V.:30.7; IV Piggyback:200] Out: 2100 [Urine:2100] Lab Results  Component Value Date   CREATININE 2.62 (H) 01/05/2022   CREATININE 2.63 (H) 01/04/2022   CREATININE 2.63 (H) 01/03/2022     Objective:  Vital signs in last 24 hours:  Temp:  [97.3 F (36.3 C)-98.4 F (36.9 C)] 97.9 F (36.6 C) (03/03 1200) Pulse Rate:  [72-78] 78 (03/03 1200) Resp:  [16-20] 20 (03/03 1200) BP: (104-119)/(72-78) 115/72 (03/03 1200) SpO2:  [95 %-99 %] 97 % (03/03 1200)  Weight change:  Filed Weights   12/28/21 1500 12/31/21 1741 01/02/22 1118  Weight: 92.6 kg 91.4 kg 91.4 kg    Intake/Output: I/O last 3 completed shifts: In: 1134.7 [P.O.:360; I.V.:30.7; Blood:544; IV EHMCNOBSJ:628] Out: 3500 [Urine:3500]   Intake/Output this shift:  Total I/O In: 483 [P.O.:480; I.V.:3] Out: 1200 [Urine:1200]  Physical Exam: General: NAD  Head: Normocephalic, atraumatic. Moist oral mucosal membranes  Eyes: Anicteric  Lungs:  Normal effort, Mild wheeze  Heart: Regular rate and rhythm  Abdomen:  Soft, nontender, obese  Extremities: No peripheral edema.  Neurologic: Nonfocal, moving all four extremities  Skin: No acute skin rash       Basic Metabolic Panel: Recent Labs  Lab 01/01/22 0529 01/01/22 0530 01/01/22 1714 01/02/22 0639 01/03/22 0637 01/04/22 0626 01/05/22 0521  NA  --  134* 132* 135 133* 134* 134*  K  --  3.9 4.0 4.3 4.6 4.2 4.3  CL  --  95* 95* 98 97* 98 100  CO2  --  26 28 24 25 25 25   GLUCOSE  --  95 120* 95 169* 100* 101*  BUN  --  48* 46* 40* 37* 33* 31*  CREATININE  --  2.90* 2.78* 2.70* 2.63* 2.63* 2.62*  CALCIUM  --  8.2* 7.9* 8.4* 8.1* 8.4* 8.5*  MG 1.8  --   --  1.8 1.5* 1.6* 2.0  PHOS  --  4.3  --  3.7 3.6 3.6 3.8     Liver Function  Tests: Recent Labs  Lab 01/01/22 0530 01/02/22 0639 01/03/22 0637 01/04/22 0626 01/05/22 0521  ALBUMIN 2.2* 2.3* 2.0* 2.2* 2.2*    No results for input(s): LIPASE, AMYLASE in the last 168 hours. No results for input(s): AMMONIA in the last 168 hours.  CBC: Recent Labs  Lab 01/01/22 0529 01/01/22 1828 01/02/22 0639 01/03/22 0637 01/04/22 0626 01/05/22 0521  WBC 6.4  --  5.6 4.8 5.5 5.9  NEUTROABS 4.9  --  3.8 3.7 4.1 4.3  HGB 6.7* 7.4* 7.8* 7.1* 9.0* 8.7*  HCT 21.2* 22.7* 24.7* 22.2* 27.9* 27.2*  MCV 88.0  --  89.5 89.2 87.5 88.0  PLT 128*  --  122* 135* 156 152     Cardiac Enzymes: No results for input(s): CKTOTAL, CKMB, CKMBINDEX, TROPONINI in the last 168 hours.   BNP: Invalid input(s): POCBNP  CBG: Recent Labs  Lab 01/04/22 1225 01/04/22 1628 01/04/22 2036 01/05/22 0859 01/05/22 1221  GLUCAP 102* 136* 131* 97 131*     Microbiology: Results for orders placed or performed during the hospital encounter of 12/26/21  Resp Panel by RT-PCR (Flu A&B, Covid)     Status: Abnormal   Collection Time: 12/26/21 10:33 PM   Specimen: Nasopharyngeal(NP) swabs in vial transport medium  Result Value Ref Range Status   SARS Coronavirus 2 by RT PCR POSITIVE (A) NEGATIVE Final    Comment: (NOTE) SARS-CoV-2 target nucleic acids are DETECTED.  The SARS-CoV-2 RNA is generally detectable in upper respiratory specimens during the acute phase of infection. Positive results are indicative of the presence of the identified virus, but do not rule out bacterial infection or co-infection with other pathogens not detected by the test. Clinical correlation with patient history and other diagnostic information is necessary to determine patient infection status. The expected result is Negative.  Fact Sheet for Patients: EntrepreneurPulse.com.au  Fact Sheet for Healthcare Providers: IncredibleEmployment.be  This test is not yet approved or  cleared by the [place] FDA and  has been authorized for detection and/or diagnosis of SARS-CoV-2 by FDA under an Emergency Use Authorization (EUA).  This EUA will remain in effect (meaning this test can be used) for the duration of  the COVID-19 declaration under Section 564(b)(1) of the A ct, 21 U.S.C. section 360bbb-3(b)(1), unless the authorization is terminated or revoked sooner.     Influenza A by PCR NEGATIVE NEGATIVE Final   Influenza B by PCR NEGATIVE NEGATIVE Final    Comment: (NOTE) The Xpert Xpress SARS-CoV-2/FLU/RSV plus assay is intended as an aid in the diagnosis of influenza from Nasopharyngeal swab specimens and should not be used as a sole basis for treatment. Nasal washings and aspirates are unacceptable for Xpert Xpress SARS-CoV-2/FLU/RSV testing.  Fact Sheet for Patients: EntrepreneurPulse.com.au  Fact Sheet for Healthcare Providers: IncredibleEmployment.be  This test is not yet approved or cleared by the [place] FDA and has been authorized for detection and/or diagnosis of SARS-CoV-2 by FDA under an Emergency Use Authorization (EUA). This EUA will remain in effect (meaning this test can be used) for the duration of the COVID-19 declaration under Section 564(b)(1) of the Act, 21 U.S.C. section 360bbb-3(b)(1), unless the authorization is terminated or revoked.  Performed at [facility], [address] [place], [place] [address]   Blood culture (routine x 2)     Status: None   Collection Time: 12/26/21 10:33 PM   Specimen: BLOOD  Result Value Ref Range Status   Specimen Description BLOOD LEFT ASSIST CONTROL  Final   Special Requests   Final    BOTTLES DRAWN AEROBIC AND ANAEROBIC Blood Culture adequate volume   Culture   Final    NO GROWTH 5 DAYS Performed at [facility], [address] [place], [place] [address]    Report Status 12/31/2021 FINAL  Final  Blood culture (routine x 2)      Status: Abnormal   Collection Time: 12/26/21 10:46 PM   Specimen: BLOOD  Result Value Ref Range Status   Specimen Description   Final    BLOOD RIGHT ANTECUBITAL Performed at [facility], [place] [address], [place], [place] [address]    Special Requests   Final    BOTTLES DRAWN AEROBIC AND ANAEROBIC Blood Culture adequate volume Performed at [facility], [address] [place], [place] [address]    Culture  Setup Time   Final    GRAM NEGATIVE RODS ANAEROBIC BOTTLE ONLY Organism ID to follow CRITICAL RESULT CALLED TO, READ BACK BY AND VERIFIED WITH: [facility] MITCHELL IWOEHO 1224 12/27/21 HNM    Culture (A)  Final    ESCHERICHIA COLI Confirmed Extended Spectrum Beta-Lactamase Producer (ESBL).  In bloodstream infections from ESBL organisms, carbapenems are preferred over piperacillin/tazobactam. They are shown to have a lower risk  of mortality.    Report Status 12/29/2021 FINAL  Final   Organism ID, Bacteria ESCHERICHIA COLI  Final      Susceptibility   Escherichia coli - MIC*    AMPICILLIN >=32 RESISTANT Resistant     CEFAZOLIN >=64 RESISTANT Resistant     CEFEPIME 16 RESISTANT Resistant     CEFTAZIDIME RESISTANT Resistant     CEFTRIAXONE >=64 RESISTANT Resistant     CIPROFLOXACIN <=0.25 SENSITIVE Sensitive     GENTAMICIN <=1 SENSITIVE Sensitive     IMIPENEM <=0.25 SENSITIVE Sensitive     TRIMETH/SULFA <=20 SENSITIVE Sensitive     AMPICILLIN/SULBACTAM 8 SENSITIVE Sensitive     PIP/TAZO <=4 SENSITIVE Sensitive     * ESCHERICHIA COLI  Blood Culture ID Panel (Reflexed)     Status: Abnormal   Collection Time: 12/26/21 10:46 PM  Result Value Ref Range Status   Enterococcus faecalis NOT DETECTED NOT DETECTED Final   Enterococcus Faecium NOT DETECTED NOT DETECTED Final   Listeria monocytogenes NOT DETECTED NOT DETECTED Final   Staphylococcus species NOT DETECTED NOT DETECTED Final   Staphylococcus aureus (BCID) NOT DETECTED NOT DETECTED Final   Staphylococcus  epidermidis NOT DETECTED NOT DETECTED Final   Staphylococcus lugdunensis NOT DETECTED NOT DETECTED Final   Streptococcus species NOT DETECTED NOT DETECTED Final   Streptococcus agalactiae NOT DETECTED NOT DETECTED Final   Streptococcus pneumoniae NOT DETECTED NOT DETECTED Final   Streptococcus pyogenes NOT DETECTED NOT DETECTED Final   A.calcoaceticus-baumannii NOT DETECTED NOT DETECTED Final   Bacteroides fragilis NOT DETECTED NOT DETECTED Final   Enterobacterales DETECTED (A) NOT DETECTED Final    Comment: Enterobacterales represent a large order of gram negative bacteria, not a single organism. CRITICAL RESULT CALLED TO, READ BACK BY AND VERIFIED WITH: DEVAN MITCHELL PHARMD 1126 12/27/21 HNM    Enterobacter cloacae complex NOT DETECTED NOT DETECTED Final   Escherichia coli DETECTED (A) NOT DETECTED Final    Comment: CRITICAL RESULT CALLED TO, READ BACK BY AND VERIFIED WITH: DEVAN MITCHELL WOEHOZ 2248 12/27/21 HNM    Klebsiella aerogenes NOT DETECTED NOT DETECTED Final   Klebsiella oxytoca NOT DETECTED NOT DETECTED Final   Klebsiella pneumoniae NOT DETECTED NOT DETECTED Final   Proteus species NOT DETECTED NOT DETECTED Final   Salmonella species NOT DETECTED NOT DETECTED Final   Serratia marcescens NOT DETECTED NOT DETECTED Final   Haemophilus influenzae NOT DETECTED NOT DETECTED Final   Neisseria meningitidis NOT DETECTED NOT DETECTED Final   Pseudomonas aeruginosa NOT DETECTED NOT DETECTED Final   Stenotrophomonas maltophilia NOT DETECTED NOT DETECTED Final   Candida albicans NOT DETECTED NOT DETECTED Final   Candida auris NOT DETECTED NOT DETECTED Final   Candida glabrata NOT DETECTED NOT DETECTED Final   Candida krusei NOT DETECTED NOT DETECTED Final   Candida parapsilosis NOT DETECTED NOT DETECTED Final   Candida tropicalis NOT DETECTED NOT DETECTED Final   Cryptococcus neoformans/gattii NOT DETECTED NOT DETECTED Final   CTX-M ESBL DETECTED (A) NOT DETECTED Final    Comment:  CRITICAL RESULT CALLED TO, READ BACK BY AND VERIFIED WITH: DEVAN MITCHELL PHARMD 1126 12/27/21 HNM (NOTE) Extended spectrum beta-lactamase detected. Recommend a carbapenem as initial therapy.      Carbapenem resistance IMP NOT DETECTED NOT DETECTED Final   Carbapenem resistance KPC NOT DETECTED NOT DETECTED Final   Carbapenem resistance NDM NOT DETECTED NOT DETECTED Final   Carbapenem resist OXA 48 LIKE NOT DETECTED NOT DETECTED Final   Carbapenem resistance VIM NOT DETECTED NOT DETECTED Final  Comment: Performed at [facility], [address] [place], [place] [address]  Urine Culture     Status: Abnormal   Collection Time: 12/27/21  9:59 AM   Specimen: Urine, Clean Catch  Result Value Ref Range Status   Specimen Description   Final    URINE, CLEAN CATCH Performed at [facility], [address] [place], [place] [address]    Special Requests   Final    NONE Performed at [facility], [address] [place], [place] [address]    Culture (A)  Final    <10,000 COLONIES/mL INSIGNIFICANT GROWTH Performed at [facility] [address], [place], [place] [address]    Report Status 12/28/2021 FINAL  Final  Antifungal AST 9 Drug Panel     Status: None   Collection Time: 12/28/21  6:21 AM  Result Value Ref Range Status   Organism ID, Yeast Candida glabrata  Corrected    Comment: (NOTE) Identification performed by account, not confirmed by this laboratory. CORRECTED ON 03/02 AT 1136: PREVIOUSLY REPORTED AS Preliminary report    Amphotericin B MIC 1.0 ug/mL  Final    Comment: (NOTE) Breakpoints have been established for only some organism-drug combinations as indicated. This test was developed and its performance characteristics determined by [facility]. It has not been cleared or approved by the Food and Drug Administration.    Anidulafungin MIC Comment  Final    Comment: (NOTE) 0.06 ug/mL Susceptible Breakpoints have been established for  only some organism-drug combinations as indicated. This test was developed and its performance characteristics determined by [facility]. It has not been cleared or approved by the Food and Drug Administration.    Caspofungin MIC Comment  Final    Comment: (NOTE) 0.12 ug/mL Susceptible Breakpoints have been established for only some organism-drug combinations as indicated. This test was developed and its performance characteristics determined by [facility]. It has not been cleared or approved by the Food and Drug Administration.    Micafungin MIC Comment  Final    Comment: (NOTE) 0.016 ug/mL Susceptible Breakpoints have been established for only some organism-drug combinations as indicated. This test was developed and its performance characteristics determined by [facility]. It has not been cleared or approved by the Food and Drug Administration.    Posaconazole MIC 1.0 ug/mL  Final    Comment: (NOTE) Breakpoints have been established for only some organism-drug combinations as indicated. This test was developed and its performance characteristics determined by [facility]. It has not been cleared or approved by the Food and Drug Administration.    Fluconazole Islt MIC 16.0 ug/mL  Final    Comment: (NOTE) Susceptible Dose Dependent Breakpoints have been established for only some organism-drug combinations as indicated. This test was developed and its performance characteristics determined by [facility]. It has not been cleared or approved by the Food and Drug Administration.    Flucytosine MIC 0.06 ug/mL  Final    Comment: (NOTE) Breakpoints have been established for only some organism-drug combinations as indicated. This test was developed and its performance characteristics determined by [facility]. It has not been cleared or approved by the Food and Drug Administration.    Itraconazole MIC 1.0 ug/mL  Final    Comment: (NOTE) Breakpoints have been established for only some  organism-drug combinations as indicated. This test was developed and its performance characteristics determined by [facility]. It has not been cleared or approved by the Food and Drug Administration.    Voriconazole MIC 0.25 ug/mL  Final  Comment: (NOTE) Breakpoints have been established for only some organism-drug combinations as indicated. This test was developed and its performance characteristics determined by [facility]. It has not been cleared or approved by the Food and Drug Administration. Performed At: [facility] [address] [place], [place] [id number] Rush Farmer MD RX:[id number]    Source CANDIDA GLABRATA FROM BLOOD  Final    Comment: Performed at [facility], [place] [address], [place], [place] [address]  MRSA Next Gen by PCR, Nasal     Status: Abnormal   Collection Time: 12/28/21  3:10 PM   Specimen: Nasal Mucosa; Nasal Swab  Result Value Ref Range Status   MRSA by PCR Next Gen DETECTED (A) NOT DETECTED Final    Comment: RESULT CALLED TO, READ BACK BY AND VERIFIED WITH: GINA SHANNON 1701 12/28/21 MU (NOTE) The GeneXpert MRSA Assay (FDA approved for NASAL specimens only), is one component of a comprehensive MRSA colonization surveillance program. It is not intended to diagnose MRSA infection nor to guide or monitor treatment for MRSA infections. Test performance is not FDA approved in patients less than 71 years old. Performed at [facility], [address] [place], [place] [address]   CULTURE, BLOOD (ROUTINE X 2) w Reflex to ID Panel     Status: None   Collection Time: 12/29/21  6:21 AM   Specimen: BLOOD  Result Value Ref Range Status   Specimen Description BLOOD RIGHT [facility]  Final   Special Requests   Final    BOTTLES DRAWN AEROBIC AND ANAEROBIC Blood Culture adequate volume   Culture   Final    NO GROWTH 5 DAYS Performed at [facility], [address] [place], [place] [address]    Report Status 01/03/2022 FINAL  Final   CULTURE, BLOOD (ROUTINE X 2) w Reflex to ID Panel     Status: Abnormal   Collection Time: 12/29/21  6:21 AM   Specimen: BLOOD  Result Value Ref Range Status   Specimen Description   Final    BLOOD RIGHT HAND Performed at [facility], [address] [place], [place] [address]    Special Requests   Final    IN PEDIATRIC BOTTLE Blood Culture adequate volume Performed at [facility], [address], [place], [place] [address]    Culture  Setup Time   Final    YEAST IN PEDIATRIC BOTTLE Organism ID to follow CRITICAL RESULT CALLED TO, READ BACK BY AND VERIFIED WITH: BRANDON BEERS @1310  12/30/21 SCS    Culture (A)  Final    CANDIDA GLABRATA SEE SEPARATE REPORT Performed at [facility], [place] [address], [place], [place] [address]    Report Status 01/04/2022 FINAL  Final  Blood Culture ID Panel (Reflexed)     Status: Abnormal   Collection Time: 12/29/21  6:21 AM  Result Value Ref Range Status   Enterococcus faecalis NOT DETECTED NOT DETECTED Final   Enterococcus Faecium NOT DETECTED NOT DETECTED Final   Listeria monocytogenes NOT DETECTED NOT DETECTED Final   Staphylococcus species NOT DETECTED NOT DETECTED Final   Staphylococcus aureus (BCID) NOT DETECTED NOT DETECTED Final   Staphylococcus epidermidis NOT DETECTED NOT DETECTED Final   Staphylococcus lugdunensis NOT DETECTED NOT DETECTED Final   Streptococcus species NOT DETECTED NOT DETECTED Final   Streptococcus agalactiae NOT DETECTED NOT DETECTED Final   Streptococcus pneumoniae NOT DETECTED NOT DETECTED Final   Streptococcus pyogenes NOT DETECTED NOT DETECTED Final   A.calcoaceticus-baumannii NOT DETECTED NOT  DETECTED Final   Bacteroides fragilis NOT DETECTED NOT DETECTED Final   Enterobacterales NOT DETECTED NOT DETECTED Final   Enterobacter cloacae complex NOT DETECTED NOT DETECTED Final   Escherichia coli NOT DETECTED NOT DETECTED Final   Klebsiella aerogenes NOT DETECTED NOT DETECTED Final    Klebsiella oxytoca NOT DETECTED NOT DETECTED Final   Klebsiella pneumoniae NOT DETECTED NOT DETECTED Final   Proteus species NOT DETECTED NOT DETECTED Final   Salmonella species NOT DETECTED NOT DETECTED Final   Serratia marcescens NOT DETECTED NOT DETECTED Final   Haemophilus influenzae NOT DETECTED NOT DETECTED Final   Neisseria meningitidis NOT DETECTED NOT DETECTED Final   Pseudomonas aeruginosa NOT DETECTED NOT DETECTED Final   Stenotrophomonas maltophilia NOT DETECTED NOT DETECTED Final   Candida albicans NOT DETECTED NOT DETECTED Final   Candida auris NOT DETECTED NOT DETECTED Final   Candida glabrata DETECTED (A) NOT DETECTED Final    Comment: CRITICAL RESULT CALLED TO, READ BACK BY AND VERIFIED WITH: BRANDON BEERS @1310  12/30/21 SCS    Candida krusei NOT DETECTED NOT DETECTED Final   Candida parapsilosis NOT DETECTED NOT DETECTED Final   Candida tropicalis NOT DETECTED NOT DETECTED Final   Cryptococcus neoformans/gattii NOT DETECTED NOT DETECTED Final    Comment: Performed at [facility], [address] [place], [place] [address]  CULTURE, BLOOD (ROUTINE X 2) w Reflex to ID Panel     Status: None   Collection Time: 12/31/21  3:08 AM   Specimen: BLOOD  Result Value Ref Range Status   Specimen Description BLOOD BLOOD RIGHT HAND  Final   Special Requests   Final    BOTTLES DRAWN AEROBIC AND ANAEROBIC Blood Culture adequate volume   Culture   Final    NO GROWTH 5 DAYS Performed at [facility], [address] [place],  [address]    Report Status 01/05/2022 FINAL  Final  CULTURE, BLOOD (ROUTINE X 2) w Reflex to ID Panel     Status: None   Collection Time: 12/31/21  3:08 AM   Specimen: BLOOD  Result Value Ref Range Status   Specimen Description BLOOD BLOOD LEFT HAND  Final   Special Requests   Final    BOTTLES DRAWN AEROBIC AND ANAEROBIC Blood Culture adequate volume   Culture   Final    NO GROWTH 5 DAYS Performed at [facility], [address] [place],  [address]    Report Status 01/05/2022 FINAL  Final    Coagulation Studies: No results for input(s): LABPROT, INR in the last 72 hours.  Urinalysis: No results for input(s): COLORURINE, LABSPEC, PHURINE, GLUCOSEU, HGBUR, BILIRUBINUR, KETONESUR, PROTEINUR, UROBILINOGEN, NITRITE, LEUKOCYTESUR in the last 72 hours.  Invalid input(s): APPERANCEUR     Imaging: US Venous Img Lower Bilateral (DVT)  Result Date: 01/05/2022 CLINICAL DATA:  Bilateral lower extremity swelling for the past 6 days EXAM: BILATERAL LOWER EXTREMITY VENOUS DOPPLER ULTRASOUND TECHNIQUE: Gray-scale sonography with graded compression, as well as color Doppler and duplex ultrasound were performed to evaluate the lower extremity deep venous systems from the level of the common femoral vein and including the common femoral, femoral, profunda femoral, popliteal and calf veins including the posterior tibial, peroneal and gastrocnemius veins when visible. The superficial great saphenous vein was also interrogated. Spectral Doppler was utilized to evaluate flow at rest and with distal augmentation maneuvers in the common femoral, femoral and popliteal veins. COMPARISON:  None. FINDINGS: RIGHT LOWER EXTREMITY Common Femoral Vein: No evidence of thrombus. Normal  compressibility, respiratory phasicity and response to augmentation. Saphenofemoral Junction: No evidence of thrombus. Normal compressibility and flow on color Doppler imaging. Profunda Femoral Vein: No evidence of thrombus. Normal compressibility and flow on color Doppler imaging. Femoral Vein: No evidence of thrombus. Normal compressibility, respiratory phasicity and response to augmentation. Popliteal Vein: No evidence of thrombus. Normal compressibility, respiratory phasicity and response to augmentation. Calf Veins: No evidence of thrombus. Normal compressibility and flow on color Doppler imaging. Superficial Great Saphenous Vein: No evidence of  thrombus. Normal compressibility. Venous Reflux:  None. Other Findings:  None. LEFT LOWER EXTREMITY Common Femoral Vein: No evidence of thrombus. Normal compressibility, respiratory phasicity and response to augmentation. Saphenofemoral Junction: No evidence of thrombus. Normal compressibility and flow on color Doppler imaging. Profunda Femoral Vein: No evidence of thrombus. Normal compressibility and flow on color Doppler imaging. Femoral Vein: No evidence of thrombus. Normal compressibility, respiratory phasicity and response to augmentation. Popliteal Vein: No evidence of thrombus. Normal compressibility, respiratory phasicity and response to augmentation. Calf Veins: No evidence of thrombus. Normal compressibility and flow on color Doppler imaging. Superficial Great Saphenous Vein: No evidence of thrombus. Normal compressibility. Venous Reflux:  None. Other Findings:  None. IMPRESSION: No evidence of deep venous thrombosis in either lower extremity. Electronically Signed   By: Jacqulynn Cadet M.D.   On: 01/05/2022 11:20     Medications:    sodium chloride Stopped (12/30/21 1542)   sodium chloride Stopped (01/02/22 0910)   anidulafungin 100 mg (01/05/22 1225)   ertapenem 1,000 mg (01/05/22 0905)    [START ON 01/06/2022] amiodarone  200 mg Oral Daily   Chlorhexidine Gluconate Cloth  6 each Topical Daily   enoxaparin (LOVENOX) injection  40 mg Subcutaneous Q24H   insulin aspart  0-15 Units Subcutaneous TID WC   insulin aspart  0-5 Units Subcutaneous QHS   levalbuterol  2 puff Inhalation Q8H   magnesium oxide  400 mg Oral BID   melatonin  5 mg Oral QHS   midodrine  5 mg Oral TID WC   multivitamin with minerals  1 tablet Oral Daily   pantoprazole  40 mg Oral BID   pneumococcal 23 valent vaccine  0.5 mL Intramuscular Tomorrow-1000   sodium chloride flush  3 mL Intravenous Q12H   sodium chloride flush  3 mL Intravenous Q12H   tamsulosin  0.4 mg Oral Daily   tiotropium  1 capsule Inhalation  Daily   torsemide  20 mg Oral Once   traZODone  50 mg Oral QHS   sodium chloride, acetaminophen **OR** acetaminophen, menthol-cetylpyridinium, ondansetron **OR** ondansetron (ZOFRAN) IV, oxybutynin, oxyCODONE, sodium chloride flush  Assessment/ Plan:  Wayne Burns is a 66 y.o.  male with past medical conditions including COPD and hypertension , who was admitted to [facility] on 12/26/2021 for Atrial fibrillation with rapid ventricular response ([place]) [I48.91] Acute cystitis without hematuria [N30.00] AKI (acute kidney injury) ([place]) [N17.9] Septic shock ([place]) [A41.9, R65.21] Sepsis ([place]) [A41.9] Sepsis, due to unspecified organism, unspecified whether acute organ dysfunction present ([place]) [A41.9]   Acute kidney injury with chronic kidney disease stage IV.  Baseline appears to be 2.8 with GFR 25 on 12/22/2021.  AKI may be due to possible obstruction from migrating stent recently placed at [facility] [facility]    Renal function appears stable since urology procedure.  Adequate urine output noted.  We will continue to monitor patient however may not visit daily.  Lab Results  Component Value Date   CREATININE 2.62 (H) 01/05/2022   CREATININE 2.63 (H)  01/04/2022   CREATININE 2.63 (H) 01/03/2022    Intake/Output Summary (Last 24 hours) at 01/05/2022 1523 Last data filed at 01/05/2022 0914 Gross per 24 hour  Intake 483 ml  Output 1200 ml  Net -717 ml    2.  Hypotension.  Currently on amiodarone drip with midodrine schedule III times daily.  Home regimen of amlodipine currently held  Blood pressure stable  3.  Acute metabolic acidosis.  Serum bicarbonate stable  4. Septic shock with ESBL acute cystitis and candida glabrata fungemia.  Recommendation made to continue antibiotics at discharge however unable to secure home health agency to provide services.  As of now patient will remain hospitalized to receive prescribed antibiotics.   LOS: [place] 3/3/20233:23 PM

## 2022-01-05 NOTE — Assessment & Plan Note (Addendum)
Ultrasound negative for DVT.   ?Holding torsemide at this point with acute kidney injury ?

## 2022-01-06 DIAGNOSIS — B379 Candidiasis, unspecified: Secondary | ICD-10-CM | POA: Diagnosis not present

## 2022-01-06 DIAGNOSIS — N179 Acute kidney failure, unspecified: Secondary | ICD-10-CM | POA: Diagnosis not present

## 2022-01-06 DIAGNOSIS — A419 Sepsis, unspecified organism: Secondary | ICD-10-CM | POA: Diagnosis not present

## 2022-01-06 DIAGNOSIS — M8660 Other chronic osteomyelitis, unspecified site: Secondary | ICD-10-CM

## 2022-01-06 DIAGNOSIS — R7881 Bacteremia: Secondary | ICD-10-CM | POA: Diagnosis not present

## 2022-01-06 LAB — CBC WITH DIFFERENTIAL/PLATELET
Abs Immature Granulocytes: 0.04 10*3/uL (ref 0.00–0.07)
Basophils Absolute: 0 10*3/uL (ref 0.0–0.1)
Basophils Relative: 0 %
Eosinophils Absolute: 0.2 10*3/uL (ref 0.0–0.5)
Eosinophils Relative: 4 %
HCT: 27.1 % — ABNORMAL LOW (ref 39.0–52.0)
Hemoglobin: 8.5 g/dL — ABNORMAL LOW (ref 13.0–17.0)
Immature Granulocytes: 1 %
Lymphocytes Relative: 17 %
Lymphs Abs: 0.9 10*3/uL (ref 0.7–4.0)
MCH: 27.8 pg (ref 26.0–34.0)
MCHC: 31.4 g/dL (ref 30.0–36.0)
MCV: 88.6 fL (ref 80.0–100.0)
Monocytes Absolute: 0.4 10*3/uL (ref 0.1–1.0)
Monocytes Relative: 8 %
Neutro Abs: 3.8 10*3/uL (ref 1.7–7.7)
Neutrophils Relative %: 70 %
Platelets: 138 10*3/uL — ABNORMAL LOW (ref 150–400)
RBC: 3.06 MIL/uL — ABNORMAL LOW (ref 4.22–5.81)
RDW: 17.3 % — ABNORMAL HIGH (ref 11.5–15.5)
WBC: 5.4 10*3/uL (ref 4.0–10.5)
nRBC: 0 % (ref 0.0–0.2)

## 2022-01-06 LAB — RENAL FUNCTION PANEL
Albumin: 2.1 g/dL — ABNORMAL LOW (ref 3.5–5.0)
Anion gap: 7 (ref 5–15)
BUN: 28 mg/dL — ABNORMAL HIGH (ref 8–23)
CO2: 26 mmol/L (ref 22–32)
Calcium: 8.1 mg/dL — ABNORMAL LOW (ref 8.9–10.3)
Chloride: 102 mmol/L (ref 98–111)
Creatinine, Ser: 2.71 mg/dL — ABNORMAL HIGH (ref 0.61–1.24)
GFR, Estimated: 26 mL/min — ABNORMAL LOW (ref >60)
Glucose, Bld: 123 mg/dL — ABNORMAL HIGH (ref 70–99)
Phosphorus: 3.8 mg/dL (ref 2.5–4.6)
Potassium: 3.8 mmol/L (ref 3.5–5.1)
Sodium: 135 mmol/L (ref 135–145)

## 2022-01-06 LAB — MAGNESIUM: Magnesium: 1.4 mg/dL — ABNORMAL LOW (ref 1.7–2.4)

## 2022-01-06 LAB — GLUCOSE, CAPILLARY
Glucose-Capillary: 121 mg/dL — ABNORMAL HIGH (ref 70–99)
Glucose-Capillary: 121 mg/dL — ABNORMAL HIGH (ref 70–99)
Glucose-Capillary: 152 mg/dL — ABNORMAL HIGH (ref 70–99)
Glucose-Capillary: 117 mg/dL — ABNORMAL HIGH (ref 70–99)

## 2022-01-06 MED ORDER — TORSEMIDE 20 MG PO TABS
20.0000 mg | ORAL_TABLET | Freq: Two times a day (BID) | ORAL | Status: AC
  Administered 2022-01-06 (×2): 20 mg via ORAL
  Filled 2022-01-06 (×2): qty 1

## 2022-01-06 NOTE — Progress Notes (Signed)
Patient noted very irritable complaining of being asked the questions by everyone who walks in the room stating, "I don't know why I have to cath myself to get the urine. All that information should be in my chart".  RN informed that the hospital staff ask health related question to assess patient's understanding of his health conditions and to ensure patient has  an understanding of how the treatment regimen should be. Patient apologized to RN stating, "I don't mean to be ugly but I don't know. I been asking the same questions for weeks". Patient then asked for shasta drinks and coffee and stated I will behave the rest of the night if you get the coffee for me. Patient is in no apparent distress. Denied pain or discomfort. VSS. Call bell kept within reach. All trashed cleaned from the floor and bedside table by RN. Will continue to monitor and endorse.  ?

## 2022-01-06 NOTE — Progress Notes (Addendum)
?Progress Note ? ? ?PatientHerve Burns RCB:[id number] DOB: [birth year] DOA: 12/26/2021     10 ?DOS: the patient was seen and examined on 01/06/2022 ?  ? ? ?Assessment and Plan: ?* Septic shock ([place]) ?ESBL E. coli acute cystitis and Candida glabrata fungemia.  Patient will be on IV antibiotics through 3/12.  IV Invanz and IV Eraxis. ? ?Candida glabrata infection ?Continue Eraxis through 01/14/2022. ? ?Acute kidney injury superimposed on CKD ([place]) ?Acute kidney injury likely on chronic kidney disease stage IV.  Creatinine peaked at 3.86 on 12/28/2021.  Creatinine still 2.71 today.  Recent creatinine at [facility] on 12/22/2021 was 2.8.  Nephrology ordered 2 doses of torsemide today for lower extremity swelling. ? ?Anemia of chronic disease ?Patient transfused on a hemoglobin of 7.1 on 01/03/2022.  Hemoglobin 8.5 today. ? ?Atrial fibrillation with RVR ([place]) ?Continue oral amiodarone.  Cardiology holding off on anticoagulation with the patient's anemia. ? 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  Dr. Bernardo Heater did stent repositioning on 01/02/2022. ? ?Chronic osteomyelitis ([place]) ?Patient has had a chronic wound that is been draining on his right ankle for years.  He stated he was treated with a PICC line and IV antibiotics in the past. ? ?COVID-19 virus infection ?Previous diagnosis at [facility] not on isolation at this point.  Asymptomatic ? ?Hypomagnesemia ?Continue oral magnesium.  Magnesium in the normal range today ? ?Elevated troponin ?Elevated troponin to sepsis. ? ? ?Depression ?Seen by psychiatry and no further recommendations.  Patient declined Zoloft. ? ?Hyponatremia ?Sodium 134 ? ?Lower extremity edema ?Ultrasound negative for DVT.  We will give 1 dose of torsemide and check creatinine tomorrow. ? ?Hypotension ?Patient on low-dose midodrine. ? ? ? ? ? ? ? ?Subjective: Patient feeling fine.  He stated he urinated very well yesterday with torsemide dose.  Was given torsemide again today and  urinating well.  Patient does have some swelling on his legs but will be better today.  States he has had a chronic wound on his right ankle where he had hardware removed.  He was on a PICC line and antibiotics at that time for infection. ? ?Physical Exam: ?Vitals:  ? 01/05/22 2327 01/06/22 0319 01/06/22 0755 01/06/22 1131  ?BP: 137/78 120/69 136/88 114/71  ?Pulse: 85 72 86 76  ?Resp: 18 18 18 18   ?Temp: 98.1 ?F (36.7 ?C) 97.9 ?F (36.6 ?C) 97.7 ?F (36.5 ?C) 97.7 ?F (36.5 ?C)  ?TempSrc:      ?SpO2: 98% 96% 99% 96%  ?Weight:      ?Height:      ? ?Physical Exam ?HENT:  ?   Head: Normocephalic.  ?   Mouth/Throat:  ?   Pharynx: No oropharyngeal exudate.  ?Eyes:  ?   General: Lids are normal.  ?   Conjunctiva/sclera: Conjunctivae normal.  ?Cardiovascular:  ?   Rate and Rhythm: Normal rate and regular rhythm.  ?   Heart sounds: Normal heart sounds, S1 normal and S2 normal.  ?Pulmonary:  ?   Breath sounds: No decreased breath sounds, wheezing, rhonchi or rales.  ?Abdominal:  ?   Palpations: Abdomen is soft.  ?   Tenderness: There is no abdominal tenderness.  ?Musculoskeletal:  ?   Right lower leg: Swelling present.  ?   Left lower leg: Swelling present.  ?Skin: ?   General: Skin is warm.  ?   Comments: Chronic wound right lower extremity with some greenish discoloration.  Some bruising in her thighs and on his arms.  ?  Neurological:  ?   Mental Status: He is alert and oriented to person, place, and time.  ?  ? ?Data Reviewed: ?Hemoglobin 8.5 today, platelet count 138, creatinine 2.71 ? ?Disposition: ?Status is: Inpatient ?Remains inpatient appropriate because: Has resistant bacteria in the blood cultures.  Needs to be here through 01/14/2022 for IV antibiotics. ? ?Planned Discharge Destination: Home ? ? ?Author: ?Loletha Grayer, MD ?01/06/2022 3:10 PM ? ?For on call review [email or web address].  ? ?

## 2022-01-06 NOTE — Assessment & Plan Note (Addendum)
Patient has had a chronic wound that is been draining on his right ankle for years.  I am not seeing any drainage today.  He stated he was treated with a PICC line and IV antibiotics in the past.   Has persistent R pedal edema.  X-ray of the right foot on 3/11 showed only soft tissue edema with no subcutaneous gas or other concerning findings. ?--Wound cultures here negative ?-- Ortho or podiatry follow-up or referral as needed outpatient ?

## 2022-01-06 NOTE — Progress Notes (Signed)
[facility]  ROUNDING NOTE   Subjective:   Patient seen today on second floor Patient resting comfortably in the bed Patient c/o increasing edema   03/03 0701 - 03/04 0700 In: 1063 [P.O.:960; I.V.:3; IV Piggyback:100] Out: 4000 [Urine:4000] Lab Results  Component Value Date   CREATININE 2.62 (H) 01/05/2022   CREATININE 2.63 (H) 01/04/2022   CREATININE 2.63 (H) 01/03/2022     Objective:  Vital signs in last 24 hours:  Temp:  [97.2 F (36.2 C)-98.5 F (36.9 C)] 97.9 F (36.6 C) (03/04 0319) Pulse Rate:  [72-85] 72 (03/04 0319) Resp:  [15-20] 18 (03/04 0319) BP: (115-137)/(69-78) 120/69 (03/04 0319) SpO2:  [96 %-99 %] 96 % (03/04 0319)  Weight change:  Filed Weights   12/28/21 1500 12/31/21 1741 01/02/22 1118  Weight: 92.6 kg 91.4 kg 91.4 kg    Intake/Output: I/O last 3 completed shifts: In: 1173.7 [P.O.:840; I.V.:33.7; IV Piggyback:300] Out: 3300 [Urine:3300]   Intake/Output this shift:  Total I/O In: 480 [P.O.:480] Out: 2800 [Urine:2800]  Physical Exam: General: NAD  Head: Normocephalic, atraumatic. Moist oral mucosal membranes  Eyes: Anicteric  Lungs:  Normal effort, Mild wheeze  Heart: Regular rate and rhythm  Abdomen:  Soft, nontender, obese  Extremities: 2+ peripheral edema.  Neurologic: Nonfocal, moving all four extremities  Skin: No acute skin rash       Basic Metabolic Panel: Recent Labs  Lab 01/01/22 0529 01/01/22 0530 01/01/22 1714 01/02/22 0639 01/03/22 0637 01/04/22 0626 01/05/22 0521  NA  --  134* 132* 135 133* 134* 134*  K  --  3.9 4.0 4.3 4.6 4.2 4.3  CL  --  95* 95* 98 97* 98 100  CO2  --  26 28 24 25 25 25   GLUCOSE  --  95 120* 95 169* 100* 101*  BUN  --  48* 46* 40* 37* 33* 31*  CREATININE  --  2.90* 2.78* 2.70* 2.63* 2.63* 2.62*  CALCIUM  --  8.2* 7.9* 8.4* 8.1* 8.4* 8.5*  MG 1.8  --   --  1.8 1.5* 1.6* 2.0  PHOS  --  4.3  --  3.7 3.6 3.6 3.8    Liver Function Tests: Recent Labs  Lab 01/01/22 0530  01/02/22 0639 01/03/22 0637 01/04/22 0626 01/05/22 0521  ALBUMIN 2.2* 2.3* 2.0* 2.2* 2.2*   No results for input(s): LIPASE, AMYLASE in the last 168 hours. No results for input(s): AMMONIA in the last 168 hours.  CBC: Recent Labs  Lab 01/01/22 0529 01/01/22 1828 01/02/22 0639 01/03/22 0637 01/04/22 0626 01/05/22 0521  WBC 6.4  --  5.6 4.8 5.5 5.9  NEUTROABS 4.9  --  3.8 3.7 4.1 4.3  HGB 6.7* 7.4* 7.8* 7.1* 9.0* 8.7*  HCT 21.2* 22.7* 24.7* 22.2* 27.9* 27.2*  MCV 88.0  --  89.5 89.2 87.5 88.0  PLT 128*  --  122* 135* 156 152    Cardiac Enzymes: No results for input(s): CKTOTAL, CKMB, CKMBINDEX, TROPONINI in the last 168 hours.   BNP: Invalid input(s): POCBNP  CBG: Recent Labs  Lab 01/04/22 2036 01/05/22 0859 01/05/22 1221 01/05/22 1537 01/05/22 2022  GLUCAP 131* 97 131* 96 130*    Microbiology: Results for orders placed or performed during the hospital encounter of 12/26/21  Resp Panel by RT-PCR (Flu A&B, Covid)     Status: Abnormal   Collection Time: 12/26/21 10:33 PM   Specimen: Nasopharyngeal(NP) swabs in vial transport medium  Result Value Ref Range Status   SARS Coronavirus 2 by  RT PCR POSITIVE (A) NEGATIVE Final    Comment: (NOTE) SARS-CoV-2 target nucleic acids are DETECTED.  The SARS-CoV-2 RNA is generally detectable in upper respiratory specimens during the acute phase of infection. Positive results are indicative of the presence of the identified virus, but do not rule out bacterial infection or co-infection with other pathogens not detected by the test. Clinical correlation with patient history and other diagnostic information is necessary to determine patient infection status. The expected result is Negative.  Fact Sheet for Patients: EntrepreneurPulse.com.au  Fact Sheet for Healthcare Providers: IncredibleEmployment.be  This test is not yet approved or cleared by the [place] FDA and  has been  authorized for detection and/or diagnosis of SARS-CoV-2 by FDA under an Emergency Use Authorization (EUA).  This EUA will remain in effect (meaning this test can be used) for the duration of  the COVID-19 declaration under Section 564(b)(1) of the A ct, 21 U.S.C. section 360bbb-3(b)(1), unless the authorization is terminated or revoked sooner.     Influenza A by PCR NEGATIVE NEGATIVE Final   Influenza B by PCR NEGATIVE NEGATIVE Final    Comment: (NOTE) The Xpert Xpress SARS-CoV-2/FLU/RSV plus assay is intended as an aid in the diagnosis of influenza from Nasopharyngeal swab specimens and should not be used as a sole basis for treatment. Nasal washings and aspirates are unacceptable for Xpert Xpress SARS-CoV-2/FLU/RSV testing.  Fact Sheet for Patients: EntrepreneurPulse.com.au  Fact Sheet for Healthcare Providers: IncredibleEmployment.be  This test is not yet approved or cleared by the [place] FDA and has been authorized for detection and/or diagnosis of SARS-CoV-2 by FDA under an Emergency Use Authorization (EUA). This EUA will remain in effect (meaning this test can be used) for the duration of the COVID-19 declaration under Section 564(b)(1) of the Act, 21 U.S.C. section 360bbb-3(b)(1), unless the authorization is terminated or revoked.  Performed at [facility], [address] [place], [place] [address]   Blood culture (routine x 2)     Status: None   Collection Time: 12/26/21 10:33 PM   Specimen: BLOOD  Result Value Ref Range Status   Specimen Description BLOOD LEFT ASSIST CONTROL  Final   Special Requests   Final    BOTTLES DRAWN AEROBIC AND ANAEROBIC Blood Culture adequate volume   Culture   Final    NO GROWTH 5 DAYS Performed at [facility], [address] [place], [place] [address]    Report Status 12/31/2021 FINAL  Final  Blood culture (routine x 2)     Status: Abnormal   Collection Time: 12/26/21  10:46 PM   Specimen: BLOOD  Result Value Ref Range Status   Specimen Description   Final    BLOOD RIGHT ANTECUBITAL Performed at [facility], [place] [address], [place], [place] [address]    Special Requests   Final    BOTTLES DRAWN AEROBIC AND ANAEROBIC Blood Culture adequate volume Performed at [facility], [address] [place], [place] [address]    Culture  Setup Time   Final    GRAM NEGATIVE RODS ANAEROBIC BOTTLE ONLY Organism ID to follow CRITICAL RESULT CALLED TO, READ BACK BY AND VERIFIED WITH: [facility] MITCHELL [id number] 12/27/21 HNM    Culture (A)  Final    ESCHERICHIA COLI Confirmed Extended Spectrum Beta-Lactamase Producer (ESBL).  In bloodstream infections from ESBL organisms, carbapenems are preferred over piperacillin/tazobactam. They are shown to have a lower risk of mortality.    Report Status 12/29/2021 FINAL  Final  Organism ID, Bacteria ESCHERICHIA COLI  Final      Susceptibility   Escherichia coli - MIC*    AMPICILLIN >=32 RESISTANT Resistant     CEFAZOLIN >=64 RESISTANT Resistant     CEFEPIME 16 RESISTANT Resistant     CEFTAZIDIME RESISTANT Resistant     CEFTRIAXONE >=64 RESISTANT Resistant     CIPROFLOXACIN <=0.25 SENSITIVE Sensitive     GENTAMICIN <=1 SENSITIVE Sensitive     IMIPENEM <=0.25 SENSITIVE Sensitive     TRIMETH/SULFA <=20 SENSITIVE Sensitive     AMPICILLIN/SULBACTAM 8 SENSITIVE Sensitive     PIP/TAZO <=4 SENSITIVE Sensitive     * ESCHERICHIA COLI  Blood Culture ID Panel (Reflexed)     Status: Abnormal   Collection Time: 12/26/21 10:46 PM  Result Value Ref Range Status   Enterococcus faecalis NOT DETECTED NOT DETECTED Final   Enterococcus Faecium NOT DETECTED NOT DETECTED Final   Listeria monocytogenes NOT DETECTED NOT DETECTED Final   Staphylococcus species NOT DETECTED NOT DETECTED Final   Staphylococcus aureus (BCID) NOT DETECTED NOT DETECTED Final   Staphylococcus epidermidis NOT DETECTED NOT DETECTED Final    Staphylococcus lugdunensis NOT DETECTED NOT DETECTED Final   Streptococcus species NOT DETECTED NOT DETECTED Final   Streptococcus agalactiae NOT DETECTED NOT DETECTED Final   Streptococcus pneumoniae NOT DETECTED NOT DETECTED Final   Streptococcus pyogenes NOT DETECTED NOT DETECTED Final   A.calcoaceticus-baumannii NOT DETECTED NOT DETECTED Final   Bacteroides fragilis NOT DETECTED NOT DETECTED Final   Enterobacterales DETECTED (A) NOT DETECTED Final    Comment: Enterobacterales represent a large order of gram negative bacteria, not a single organism. CRITICAL RESULT CALLED TO, READ BACK BY AND VERIFIED WITH: DEVAN MITCHELL PHARMD 1126 12/27/21 HNM    Enterobacter cloacae complex NOT DETECTED NOT DETECTED Final   Escherichia coli DETECTED (A) NOT DETECTED Final    Comment: CRITICAL RESULT CALLED TO, READ BACK BY AND VERIFIED WITH: DEVAN MITCHELL [id number] 12/27/21 HNM    Klebsiella aerogenes NOT DETECTED NOT DETECTED Final   Klebsiella oxytoca NOT DETECTED NOT DETECTED Final   Klebsiella pneumoniae NOT DETECTED NOT DETECTED Final   Proteus species NOT DETECTED NOT DETECTED Final   Salmonella species NOT DETECTED NOT DETECTED Final   Serratia marcescens NOT DETECTED NOT DETECTED Final   Haemophilus influenzae NOT DETECTED NOT DETECTED Final   Neisseria meningitidis NOT DETECTED NOT DETECTED Final   Pseudomonas aeruginosa NOT DETECTED NOT DETECTED Final   Stenotrophomonas maltophilia NOT DETECTED NOT DETECTED Final   Candida albicans NOT DETECTED NOT DETECTED Final   Candida auris NOT DETECTED NOT DETECTED Final   Candida glabrata NOT DETECTED NOT DETECTED Final   Candida krusei NOT DETECTED NOT DETECTED Final   Candida parapsilosis NOT DETECTED NOT DETECTED Final   Candida tropicalis NOT DETECTED NOT DETECTED Final   Cryptococcus neoformans/gattii NOT DETECTED NOT DETECTED Final   CTX-M ESBL DETECTED (A) NOT DETECTED Final    Comment: CRITICAL RESULT CALLED TO, READ BACK BY AND  VERIFIED WITH: DEVAN MITCHELL PHARMD 1126 12/27/21 HNM (NOTE) Extended spectrum beta-lactamase detected. Recommend a carbapenem as initial therapy.      Carbapenem resistance IMP NOT DETECTED NOT DETECTED Final   Carbapenem resistance KPC NOT DETECTED NOT DETECTED Final   Carbapenem resistance NDM NOT DETECTED NOT DETECTED Final   Carbapenem resist OXA 48 LIKE NOT DETECTED NOT DETECTED Final   Carbapenem resistance VIM NOT DETECTED NOT DETECTED Final    Comment: Performed at [facility], [address] [place],  [place] [address]  Urine Culture     Status: Abnormal   Collection Time: 12/27/21  9:59 AM   Specimen: Urine, Clean Catch  Result Value Ref Range Status   Specimen Description   Final    URINE, CLEAN CATCH Performed at [facility] [address], [place], [place] [address]    Special Requests   Final    NONE Performed at [facility], [address] [place], [place] [address]    Culture (A)  Final    <10,000 COLONIES/mL INSIGNIFICANT GROWTH Performed at [facility] [address] [place],  [address]    Report Status 12/28/2021 FINAL  Final  Antifungal AST 9 Drug Panel     Status: None   Collection Time: 12/28/21  6:21 AM  Result Value Ref Range Status   Organism ID, Yeast Candida glabrata  Corrected    Comment: (NOTE) Identification performed by account, not confirmed by this laboratory. CORRECTED ON 03/02 AT 1136: PREVIOUSLY REPORTED AS Preliminary report    Amphotericin B MIC 1.0 ug/mL  Final    Comment: (NOTE) Breakpoints have been established for only some organism-drug combinations as indicated. This test was developed and its performance characteristics determined by [facility]. It has not been cleared or approved by the Food and Drug Administration.    Anidulafungin MIC Comment  Final    Comment: (NOTE) 0.06 ug/mL Susceptible Breakpoints have been established for only some organism-drug combinations as  indicated. This test was developed and its performance characteristics determined by [facility]. It has not been cleared or approved by the Food and Drug Administration.    Caspofungin MIC Comment  Final    Comment: (NOTE) 0.12 ug/mL Susceptible Breakpoints have been established for only some organism-drug combinations as indicated. This test was developed and its performance characteristics determined by [facility]. It has not been cleared or approved by the Food and Drug Administration.    Micafungin MIC Comment  Final    Comment: (NOTE) 0.016 ug/mL Susceptible Breakpoints have been established for only some organism-drug combinations as indicated. This test was developed and its performance characteristics determined by [facility]. It has not been cleared or approved by the Food and Drug Administration.    Posaconazole MIC 1.0 ug/mL  Final    Comment: (NOTE) Breakpoints have been established for only some organism-drug combinations as indicated. This test was developed and its performance characteristics determined by [facility]. It has not been cleared or approved by the Food and Drug Administration.    Fluconazole Islt MIC 16.0 ug/mL  Final    Comment: (NOTE) Susceptible Dose Dependent Breakpoints have been established for only some organism-drug combinations as indicated. This test was developed and its performance characteristics determined by [facility]. It has not been cleared or approved by the Food and Drug Administration.    Flucytosine MIC 0.06 ug/mL  Final    Comment: (NOTE) Breakpoints have been established for only some organism-drug combinations as indicated. This test was developed and its performance characteristics determined by [facility]. It has not been cleared or approved by the Food and Drug Administration.    Itraconazole MIC 1.0 ug/mL  Final    Comment: (NOTE) Breakpoints have been established for only some organism-drug combinations as indicated. This  test was developed and its performance characteristics determined by [facility]. It has not been cleared or approved by the Food and Drug Administration.    Voriconazole MIC 0.25 ug/mL  Final    Comment: (NOTE) Breakpoints have been established for only  some organism-drug combinations as indicated. This test was developed and its performance characteristics determined by [facility]. It has not been cleared or approved by the Food and Drug Administration. Performed At: [facility] [address] [place], [place] [id number] Rush Farmer MD [place]:[id number]    Source CANDIDA GLABRATA FROM BLOOD  Final    Comment: Performed at [facility], [place] [address] [place], [place] [address]  MRSA Next Gen by PCR, Nasal     Status: Abnormal   Collection Time: 12/28/21  3:10 PM   Specimen: Nasal Mucosa; Nasal Swab  Result Value Ref Range Status   MRSA by PCR Next Gen DETECTED (A) NOT DETECTED Final    Comment: RESULT CALLED TO, READ BACK BY AND VERIFIED WITH: GINA SHANNON 1701 12/28/21 MU (NOTE) The GeneXpert MRSA Assay (FDA approved for NASAL specimens only), is one component of a comprehensive MRSA colonization surveillance program. It is not intended to diagnose MRSA infection nor to guide or monitor treatment for MRSA infections. Test performance is not FDA approved in patients less than 72 years old. Performed at [facility], [address] [place], [place] [address]   CULTURE, BLOOD (ROUTINE X 2) w Reflex to ID Panel     Status: None   Collection Time: 12/29/21  6:21 AM   Specimen: BLOOD  Result Value Ref Range Status   Specimen Description BLOOD RIGHT [place]  Final   Special Requests   Final    BOTTLES DRAWN AEROBIC AND ANAEROBIC Blood Culture adequate volume   Culture   Final    NO GROWTH 5 DAYS Performed at [facility], [address], [place], [place] [address]    Report Status 01/03/2022 FINAL  Final  CULTURE, BLOOD (ROUTINE X 2) w Reflex to ID  Panel     Status: Abnormal   Collection Time: 12/29/21  6:21 AM   Specimen: BLOOD  Result Value Ref Range Status   Specimen Description   Final    BLOOD RIGHT HAND Performed at [facility], [address], [place], [place] [address]    Special Requests   Final    IN PEDIATRIC BOTTLE Blood Culture adequate volume Performed at [facility], [address], [place], [place] [address]    Culture  Setup Time   Final    YEAST IN PEDIATRIC BOTTLE Organism ID to follow CRITICAL RESULT CALLED TO, READ BACK BY AND VERIFIED WITH: BRANDON BEERS @1310  12/30/21 SCS    Culture (A)  Final    CANDIDA GLABRATA SEE SEPARATE REPORT Performed at [facility], [place] [address] [place], [place] [address]    Report Status 01/04/2022 FINAL  Final  Blood Culture ID Panel (Reflexed)     Status: Abnormal   Collection Time: 12/29/21  6:21 AM  Result Value Ref Range Status   Enterococcus faecalis NOT DETECTED NOT DETECTED Final   Enterococcus Faecium NOT DETECTED NOT DETECTED Final   Listeria monocytogenes NOT DETECTED NOT DETECTED Final   Staphylococcus species NOT DETECTED NOT DETECTED Final   Staphylococcus aureus (BCID) NOT DETECTED NOT DETECTED Final   Staphylococcus epidermidis NOT DETECTED NOT DETECTED Final   Staphylococcus lugdunensis NOT DETECTED NOT DETECTED Final   Streptococcus species NOT DETECTED NOT DETECTED Final   Streptococcus agalactiae NOT DETECTED NOT DETECTED Final   Streptococcus pneumoniae NOT DETECTED NOT DETECTED Final   Streptococcus pyogenes NOT DETECTED NOT DETECTED Final   A.calcoaceticus-baumannii NOT DETECTED NOT DETECTED Final   Bacteroides fragilis NOT DETECTED  NOT DETECTED Final   Enterobacterales NOT DETECTED NOT DETECTED Final   Enterobacter cloacae complex NOT DETECTED NOT DETECTED Final   Escherichia coli NOT DETECTED NOT DETECTED Final   Klebsiella aerogenes NOT DETECTED NOT DETECTED Final   Klebsiella oxytoca NOT DETECTED NOT DETECTED  Final   Klebsiella pneumoniae NOT DETECTED NOT DETECTED Final   Proteus species NOT DETECTED NOT DETECTED Final   Salmonella species NOT DETECTED NOT DETECTED Final   Serratia marcescens NOT DETECTED NOT DETECTED Final   Haemophilus influenzae NOT DETECTED NOT DETECTED Final   Neisseria meningitidis NOT DETECTED NOT DETECTED Final   Pseudomonas aeruginosa NOT DETECTED NOT DETECTED Final   Stenotrophomonas maltophilia NOT DETECTED NOT DETECTED Final   Candida albicans NOT DETECTED NOT DETECTED Final   Candida auris NOT DETECTED NOT DETECTED Final   Candida glabrata DETECTED (A) NOT DETECTED Final    Comment: CRITICAL RESULT CALLED TO, READ BACK BY AND VERIFIED WITH: BRANDON BEERS @1310  12/30/21 SCS    Candida krusei NOT DETECTED NOT DETECTED Final   Candida parapsilosis NOT DETECTED NOT DETECTED Final   Candida tropicalis NOT DETECTED NOT DETECTED Final   Cryptococcus neoformans/gattii NOT DETECTED NOT DETECTED Final    Comment: Performed at [facility], [address] [place], [place] [address]  CULTURE, BLOOD (ROUTINE X 2) w Reflex to ID Panel     Status: None   Collection Time: 12/31/21  3:08 AM   Specimen: BLOOD  Result Value Ref Range Status   Specimen Description BLOOD BLOOD RIGHT HAND  Final   Special Requests   Final    BOTTLES DRAWN AEROBIC AND ANAEROBIC Blood Culture adequate volume   Culture   Final    NO GROWTH 5 DAYS Performed at [facility] [address] [place], [place] [address]    Report Status 01/05/2022 FINAL  Final  CULTURE, BLOOD (ROUTINE X 2) w Reflex to ID Panel     Status: None   Collection Time: 12/31/21  3:08 AM   Specimen: BLOOD  Result Value Ref Range Status   Specimen Description BLOOD BLOOD LEFT HAND  Final   Special Requests   Final    BOTTLES DRAWN AEROBIC AND ANAEROBIC Blood Culture adequate volume   Culture   Final    NO GROWTH 5 DAYS Performed at [facility], [address] [place], [place] [address]     Report Status 01/05/2022 FINAL  Final    Coagulation Studies: No results for input(s): LABPROT, INR in the last 72 hours.  Urinalysis: No results for input(s): COLORURINE, LABSPEC, PHURINE, GLUCOSEU, HGBUR, BILIRUBINUR, KETONESUR, PROTEINUR, UROBILINOGEN, NITRITE, LEUKOCYTESUR in the last 72 hours.  Invalid input(s): APPERANCEUR     Imaging: US Venous Img Lower Bilateral (DVT)  Result Date: 01/05/2022 CLINICAL DATA:  Bilateral lower extremity swelling for the past 6 days EXAM: BILATERAL LOWER EXTREMITY VENOUS DOPPLER ULTRASOUND TECHNIQUE: Gray-scale sonography with graded compression, as well as color Doppler and duplex ultrasound were performed to evaluate the lower extremity deep venous systems from the level of the common femoral vein and including the common femoral, femoral, profunda femoral, popliteal and calf veins including the posterior tibial, peroneal and gastrocnemius veins when visible. The superficial great saphenous vein was also interrogated. Spectral Doppler was utilized to evaluate flow at rest and with distal augmentation maneuvers in the common femoral, femoral and popliteal veins. COMPARISON:  None. FINDINGS: RIGHT LOWER EXTREMITY Common Femoral Vein: No evidence of thrombus. Normal compressibility, respiratory phasicity and response to augmentation. Saphenofemoral  Junction: No evidence of thrombus. Normal compressibility and flow on color Doppler imaging. Profunda Femoral Vein: No evidence of thrombus. Normal compressibility and flow on color Doppler imaging. Femoral Vein: No evidence of thrombus. Normal compressibility, respiratory phasicity and response to augmentation. Popliteal Vein: No evidence of thrombus. Normal compressibility, respiratory phasicity and response to augmentation. Calf Veins: No evidence of thrombus. Normal compressibility and flow on color Doppler imaging. Superficial Great Saphenous Vein: No evidence of thrombus. Normal compressibility. Venous Reflux:   None. Other Findings:  None. LEFT LOWER EXTREMITY Common Femoral Vein: No evidence of thrombus. Normal compressibility, respiratory phasicity and response to augmentation. Saphenofemoral Junction: No evidence of thrombus. Normal compressibility and flow on color Doppler imaging. Profunda Femoral Vein: No evidence of thrombus. Normal compressibility and flow on color Doppler imaging. Femoral Vein: No evidence of thrombus. Normal compressibility, respiratory phasicity and response to augmentation. Popliteal Vein: No evidence of thrombus. Normal compressibility, respiratory phasicity and response to augmentation. Calf Veins: No evidence of thrombus. Normal compressibility and flow on color Doppler imaging. Superficial Great Saphenous Vein: No evidence of thrombus. Normal compressibility. Venous Reflux:  None. Other Findings:  None. IMPRESSION: No evidence of deep venous thrombosis in either lower extremity. Electronically Signed   By: Jacqulynn Cadet M.D.   On: 01/05/2022 11:20     Medications:    sodium chloride Stopped (12/30/21 1542)   sodium chloride Stopped (01/02/22 0910)   anidulafungin 100 mg (01/05/22 1225)   ertapenem Stopped (01/05/22 0935)    amiodarone  200 mg Oral Daily   Chlorhexidine Gluconate Cloth  6 each Topical Daily   enoxaparin (LOVENOX) injection  40 mg Subcutaneous Q24H   insulin aspart  0-15 Units Subcutaneous TID WC   insulin aspart  0-5 Units Subcutaneous QHS   levalbuterol  2 puff Inhalation Q8H   magnesium oxide  400 mg Oral BID   melatonin  5 mg Oral QHS   midodrine  5 mg Oral TID WC   multivitamin with minerals  1 tablet Oral Daily   pantoprazole  40 mg Oral BID   pneumococcal 23 valent vaccine  0.5 mL Intramuscular Tomorrow-1000   sodium chloride flush  3 mL Intravenous Q12H   sodium chloride flush  3 mL Intravenous Q12H   tamsulosin  0.4 mg Oral Daily   tiotropium  1 capsule Inhalation Daily   traZODone  50 mg Oral QHS   sodium chloride, acetaminophen  **OR** acetaminophen, menthol-cetylpyridinium, ondansetron **OR** ondansetron (ZOFRAN) IV, oxybutynin, oxyCODONE, sodium chloride flush  Assessment/ Plan:  Mr. Wayne Burns is a 66 y.o.  male with past medical conditions including COPD and hypertension , who was admitted to [facility] on 12/26/2021 for Atrial fibrillation with rapid ventricular response ([place]) [I48.91] Acute cystitis without hematuria [N30.00] AKI (acute kidney injury) ([place]) [N17.9] Septic shock ([place]) [A41.9, R65.21] Sepsis ([place]) [A41.9] Sepsis, due to unspecified organism, unspecified whether acute organ dysfunction present ([place]) [A41.9]   Acute kidney injury with chronic kidney disease stage IV.  Baseline appears to be 2.8 with GFR 25 on 12/22/2021.  AKI may be due to possible obstruction from migrating stent recently placed at [facility].    Renal function appears stable since urology procedure.  Adequate urine output noted.  We will continue to monitor patient however may not visit daily.  Lab Results  Component Value Date   CREATININE 2.62 (H) 01/05/2022   CREATININE 2.63 (H) 01/04/2022   CREATININE 2.63 (H) 01/03/2022    Intake/Output Summary (Last 24 hours) at 01/06/2022 0449 Last data  filed at 01/06/2022 0329 Gross per 24 hour  Intake 1063 ml  Output 4000 ml  Net -2937 ml   2.  Hypotension.   Blood pressure stable  3.  Acute metabolic acidosis.  Serum bicarbonate stable  4. Septic shock with ESBL acute cystitis and candida glabrata fungemia.  Recommendation made to continue antibiotics at discharge however unable to secure home health agency to provide services.  As of now patient will remain hospitalized to receive prescribed antibiotics.   5. Lower ex edema Will give diuretics     LOS: 10 Desman Polak s Myriam Brandhorst 3/4/20234:49 AM

## 2022-01-07 DIAGNOSIS — N179 Acute kidney failure, unspecified: Secondary | ICD-10-CM | POA: Diagnosis not present

## 2022-01-07 DIAGNOSIS — B379 Candidiasis, unspecified: Secondary | ICD-10-CM | POA: Diagnosis not present

## 2022-01-07 DIAGNOSIS — R7881 Bacteremia: Secondary | ICD-10-CM | POA: Diagnosis not present

## 2022-01-07 DIAGNOSIS — A419 Sepsis, unspecified organism: Secondary | ICD-10-CM | POA: Diagnosis not present

## 2022-01-07 LAB — GLUCOSE, CAPILLARY
Glucose-Capillary: 85 mg/dL (ref 70–99)
Glucose-Capillary: 138 mg/dL — ABNORMAL HIGH (ref 70–99)
Glucose-Capillary: 122 mg/dL — ABNORMAL HIGH (ref 70–99)
Glucose-Capillary: 123 mg/dL — ABNORMAL HIGH (ref 70–99)

## 2022-01-07 LAB — RENAL FUNCTION PANEL
Albumin: 2 g/dL — ABNORMAL LOW (ref 3.5–5.0)
Anion gap: 12 (ref 5–15)
BUN: 117 mg/dL — ABNORMAL HIGH (ref 8–23)
CO2: 16 mmol/L — ABNORMAL LOW (ref 22–32)
Calcium: 8.4 mg/dL — ABNORMAL LOW (ref 8.9–10.3)
Chloride: 112 mmol/L — ABNORMAL HIGH (ref 98–111)
Creatinine, Ser: 4.75 mg/dL — ABNORMAL HIGH (ref 0.61–1.24)
GFR, Estimated: 13 mL/min — ABNORMAL LOW (ref >60)
Glucose, Bld: 111 mg/dL — ABNORMAL HIGH (ref 70–99)
Phosphorus: 3.2 mg/dL (ref 2.5–4.6)
Potassium: 4 mmol/L (ref 3.5–5.1)
Sodium: 140 mmol/L (ref 135–145)

## 2022-01-07 LAB — CBC WITH DIFFERENTIAL/PLATELET
Abs Immature Granulocytes: 0.05 10*3/uL (ref 0.00–0.07)
Basophils Absolute: 0 10*3/uL (ref 0.0–0.1)
Basophils Relative: 1 %
Eosinophils Absolute: 0.2 10*3/uL (ref 0.0–0.5)
Eosinophils Relative: 3 %
HCT: 28.7 % — ABNORMAL LOW (ref 39.0–52.0)
Hemoglobin: 9.1 g/dL — ABNORMAL LOW (ref 13.0–17.0)
Immature Granulocytes: 1 %
Lymphocytes Relative: 18 %
Lymphs Abs: 1.1 10*3/uL (ref 0.7–4.0)
MCH: 27.8 pg (ref 26.0–34.0)
MCHC: 31.7 g/dL (ref 30.0–36.0)
MCV: 87.8 fL (ref 80.0–100.0)
Monocytes Absolute: 0.5 10*3/uL (ref 0.1–1.0)
Monocytes Relative: 8 %
Neutro Abs: 4.5 10*3/uL (ref 1.7–7.7)
Neutrophils Relative %: 69 %
Platelets: 155 10*3/uL (ref 150–400)
RBC: 3.27 MIL/uL — ABNORMAL LOW (ref 4.22–5.81)
RDW: 17.3 % — ABNORMAL HIGH (ref 11.5–15.5)
WBC: 6.5 10*3/uL (ref 4.0–10.5)
nRBC: 0 % (ref 0.0–0.2)

## 2022-01-07 LAB — MAGNESIUM: Magnesium: 1.5 mg/dL — ABNORMAL LOW (ref 1.7–2.4)

## 2022-01-07 MED ORDER — MAGNESIUM SULFATE 2 GM/50ML IV SOLN
2.0000 g | Freq: Once | INTRAVENOUS | Status: AC
  Administered 2022-01-07: 2 g via INTRAVENOUS
  Filled 2022-01-07: qty 50

## 2022-01-07 MED ORDER — ENOXAPARIN SODIUM 30 MG/0.3ML IJ SOSY
30.0000 mg | PREFILLED_SYRINGE | INTRAMUSCULAR | Status: DC
Start: 2022-01-07 — End: 2022-01-11
  Administered 2022-01-07 – 2022-01-10 (×4): 30 mg via SUBCUTANEOUS
  Filled 2022-01-07 (×4): qty 0.3

## 2022-01-07 MED ORDER — SODIUM CHLORIDE 0.9 % IV BOLUS
500.0000 mL | Freq: Once | INTRAVENOUS | Status: AC
  Administered 2022-01-07: 500 mL via INTRAVENOUS

## 2022-01-07 MED ORDER — SODIUM CHLORIDE 0.9 % IV SOLN
500.0000 mg | INTRAVENOUS | Status: DC
  Filled 2022-01-07: qty 0.5

## 2022-01-07 NOTE — Progress Notes (Signed)
Legs wrapped and elevated to help reduce the swelling. PRN tylenol administered.  ?

## 2022-01-07 NOTE — Progress Notes (Signed)
PHARMACY NOTE:  ANTIMICROBIAL RENAL DOSAGE ADJUSTMENT ? ?Current antimicrobial regimen includes a mismatch between antimicrobial dosage and estimated renal function.  As per policy approved by the Pharmacy & Therapeutics and Medical Executive Committees, the antimicrobial dosage will be adjusted accordingly. ? ?Current antimicrobial dosage:  ertapenem 1g IV every 24 hours ? ?Indication: ESBL E. coli bacteremia ? ?Renal Function: ? ?Estimated Creatinine Clearance: 17.8 mL/min (A) (by C-G formula based on SCr of 4.75 mg/dL (H)). ? ?   ?Antimicrobial dosage has been changed to:  Ertapenem 500mg  IV every 24 hours ? ?Additional comments: ? ? ?Thank you for allowing pharmacy to be a part of this patient's care. ? ?Pernell Dupre, PharmD, BCPS ?Clinical Pharmacist ?01/07/2022 ?1:04 PM ? ?

## 2022-01-07 NOTE — Progress Notes (Signed)
PHARMACIST - PHYSICIAN COMMUNICATION ? ?CONCERNING:  Enoxaparin (Lovenox) for DVT Prophylaxis  ? ? ?RECOMMENDATION: ?Patient was prescribed enoxaprin 40mg  q24 hours for VTE prophylaxis.  ? ?Filed Weights  ? 12/28/21 1500 12/31/21 1741 01/02/22 1118  ?Weight: 92.6 kg (204 lb 2.3 oz) 91.4 kg (201 lb 9.6 oz) 91.4 kg (201 lb 8 oz)  ? ? ?Body mass index is 29.76 kg/m?. ? ?Estimated Creatinine Clearance: 17.8 mL/min (A) (by C-G formula based on SCr of 4.75 mg/dL (H)). ? ? ?Patient is candidate for enoxaparin 30mg  every 24 hours based on CrCl <70ml/min  ? ?DESCRIPTION: ?Pharmacy has adjusted enoxaparin dose per [facility] policy. ? ?Patient is now receiving enoxaparin 30 mg every 24 hours  ? ?Pernell Dupre, PharmD, BCPS ?Clinical Pharmacist ?01/07/2022 ?12:03 PM ? ?

## 2022-01-07 NOTE — Progress Notes (Signed)
[facility]  ROUNDING NOTE   Subjective:   Patient sitting at side of bed, alert and oriented Tolerating meals Lower extremity edema remains  Increased creatinine due to additional diuresis yesterday Urine output 2.65 L in 24 hours.  03/04 0701 - 03/05 0700 In: 2380 [P.O.:2280; IV Piggyback:100] Out: 2650 [Urine:2650] Lab Results  Component Value Date   CREATININE 4.75 (H) 01/07/2022   CREATININE 2.71 (H) 01/06/2022   CREATININE 2.62 (H) 01/05/2022     Objective:  Vital signs in last 24 hours:  Temp:  [97.4 F (36.3 C)-97.9 F (36.6 C)] 97.8 F (36.6 C) (03/05 1138) Pulse Rate:  [71-79] 79 (03/05 1138) Resp:  [18-20] 18 (03/05 1138) BP: (101-121)/(65-75) 118/65 (03/05 1138) SpO2:  [97 %-100 %] 100 % (03/05 1138)  Weight change:  Filed Weights   12/28/21 1500 12/31/21 1741 01/02/22 1118  Weight: 92.6 kg 91.4 kg 91.4 kg    Intake/Output: I/O last 3 completed shifts: In: 2860 [P.O.:2760; IV Piggyback:100] Out: 0630 [Urine:5450]   Intake/Output this shift:  Total I/O In: 1142.4 [P.O.:240; IV Piggyback:902.4] Out: 1250 [Urine:1250]  Physical Exam: General: NAD  Head: Normocephalic, atraumatic. Moist oral mucosal membranes  Eyes: Anicteric  Lungs:  Normal effort, clear to auscultation  Heart: Regular rate and rhythm  Abdomen:  Soft, nontender, obese  Extremities: 3+ peripheral edema.  Neurologic: Nonfocal, moving all four extremities  Skin: No acute skin rash       Basic Metabolic Panel: Recent Labs  Lab 01/03/22 0637 01/04/22 0626 01/05/22 0521 01/06/22 0544 01/07/22 0442  NA 133* 134* 134* 135 140  K 4.6 4.2 4.3 3.8 4.0  CL 97* 98 100 102 112*  CO2 25 25 25 26  16*  GLUCOSE 169* 100* 101* 123* 111*  BUN 37* 33* 31* 28* 117*  CREATININE 2.63* 2.63* 2.62* 2.71* 4.75*  CALCIUM 8.1* 8.4* 8.5* 8.1* 8.4*  MG 1.5* 1.6* 2.0 1.4* 1.5*  PHOS 3.6 3.6 3.8 3.8 3.2     Liver Function Tests: Recent Labs  Lab 01/03/22 0637 01/04/22 0626  01/05/22 0521 01/06/22 0544 01/07/22 0442  ALBUMIN 2.0* 2.2* 2.2* 2.1* 2.0*    No results for input(s): LIPASE, AMYLASE in the last 168 hours. No results for input(s): AMMONIA in the last 168 hours.  CBC: Recent Labs  Lab 01/03/22 0637 01/04/22 0626 01/05/22 0521 01/06/22 0544 01/07/22 0442  WBC 4.8 5.5 5.9 5.4 6.5  NEUTROABS 3.7 4.1 4.3 3.8 4.5  HGB 7.1* 9.0* 8.7* 8.5* 9.1*  HCT 22.2* 27.9* 27.2* 27.1* 28.7*  MCV 89.2 87.5 88.0 88.6 87.8  PLT 135* 156 152 138* 155     Cardiac Enzymes: No results for input(s): CKTOTAL, CKMB, CKMBINDEX, TROPONINI in the last 168 hours.   BNP: Invalid input(s): POCBNP  CBG: Recent Labs  Lab 01/06/22 1130 01/06/22 1713 01/06/22 2039 01/07/22 0804 01/07/22 1139  GLUCAP 121* 152* 117* 85 138*     Microbiology: Results for orders placed or performed during the hospital encounter of 12/26/21  Resp Panel by RT-PCR (Flu A&B, Covid)     Status: Abnormal   Collection Time: 12/26/21 10:33 PM   Specimen: Nasopharyngeal(NP) swabs in vial transport medium  Result Value Ref Range Status   SARS Coronavirus 2 by RT PCR POSITIVE (A) NEGATIVE Final    Comment: (NOTE) SARS-CoV-2 target nucleic acids are DETECTED.  The SARS-CoV-2 RNA is generally detectable in upper respiratory specimens during the acute phase of infection. Positive results are indicative of the presence of the identified virus, but  do not rule out bacterial infection or co-infection with other pathogens not detected by the test. Clinical correlation with patient history and other diagnostic information is necessary to determine patient infection status. The expected result is Negative.  Fact Sheet for Patients: EntrepreneurPulse.com.au  Fact Sheet for Healthcare Providers: IncredibleEmployment.be  This test is not yet approved or cleared by the [place] FDA and  has been authorized for detection and/or diagnosis of SARS-CoV-2  by FDA under an Emergency Use Authorization (EUA).  This EUA will remain in effect (meaning this test can be used) for the duration of  the COVID-19 declaration under Section 564(b)(1) of the A ct, 21 U.S.C. section 360bbb-3(b)(1), unless the authorization is terminated or revoked sooner.     Influenza A by PCR NEGATIVE NEGATIVE Final   Influenza B by PCR NEGATIVE NEGATIVE Final    Comment: (NOTE) The Xpert Xpress SARS-CoV-2/FLU/RSV plus assay is intended as an aid in the diagnosis of influenza from Nasopharyngeal swab specimens and should not be used as a sole basis for treatment. Nasal washings and aspirates are unacceptable for Xpert Xpress SARS-CoV-2/FLU/RSV testing.  Fact Sheet for Patients: EntrepreneurPulse.com.au  Fact Sheet for Healthcare Providers: IncredibleEmployment.be  This test is not yet approved or cleared by the [place] FDA and has been authorized for detection and/or diagnosis of SARS-CoV-2 by FDA under an Emergency Use Authorization (EUA). This EUA will remain in effect (meaning this test can be used) for the duration of the COVID-19 declaration under Section 564(b)(1) of the Act, 21 U.S.C. section 360bbb-3(b)(1), unless the authorization is terminated or revoked.  Performed at [facility], [address] [place], [place] [address]   Blood culture (routine x 2)     Status: None   Collection Time: 12/26/21 10:33 PM   Specimen: BLOOD  Result Value Ref Range Status   Specimen Description BLOOD LEFT ASSIST CONTROL  Final   Special Requests   Final    BOTTLES DRAWN AEROBIC AND ANAEROBIC Blood Culture adequate volume   Culture   Final    NO GROWTH 5 DAYS Performed at [facility], [address], [place], [place] [address]    Report Status 12/31/2021 FINAL  Final  Blood culture (routine x 2)     Status: Abnormal   Collection Time: 12/26/21 10:46 PM   Specimen: BLOOD  Result Value Ref Range  Status   Specimen Description   Final    BLOOD RIGHT ANTECUBITAL Performed at [facility], [place] [address] [place], [place] [address]    Special Requests   Final    BOTTLES DRAWN AEROBIC AND ANAEROBIC Blood Culture adequate volume Performed at [facility], [address] [place], [place] [address]    Culture  Setup Time   Final    GRAM NEGATIVE RODS ANAEROBIC BOTTLE ONLY Organism ID to follow CRITICAL RESULT CALLED TO, READ BACK BY AND VERIFIED WITH: [facility] MITCHELL FUXNAT [id number] 12/27/21 HNM    Culture (A)  Final    ESCHERICHIA COLI Confirmed Extended Spectrum Beta-Lactamase Producer (ESBL).  In bloodstream infections from ESBL organisms, carbapenems are preferred over piperacillin/tazobactam. They are shown to have a lower risk of mortality.    Report Status 12/29/2021 FINAL  Final   Organism ID, Bacteria ESCHERICHIA COLI  Final      Susceptibility   Escherichia coli - MIC*    AMPICILLIN >=32 RESISTANT Resistant     CEFAZOLIN >=64 RESISTANT Resistant     CEFEPIME 16 RESISTANT Resistant  CEFTAZIDIME RESISTANT Resistant     CEFTRIAXONE >=64 RESISTANT Resistant     CIPROFLOXACIN <=0.25 SENSITIVE Sensitive     GENTAMICIN <=1 SENSITIVE Sensitive     IMIPENEM <=0.25 SENSITIVE Sensitive     TRIMETH/SULFA <=20 SENSITIVE Sensitive     AMPICILLIN/SULBACTAM 8 SENSITIVE Sensitive     PIP/TAZO <=4 SENSITIVE Sensitive     * ESCHERICHIA COLI  Blood Culture ID Panel (Reflexed)     Status: Abnormal   Collection Time: 12/26/21 10:46 PM  Result Value Ref Range Status   Enterococcus faecalis NOT DETECTED NOT DETECTED Final   Enterococcus Faecium NOT DETECTED NOT DETECTED Final   Listeria monocytogenes NOT DETECTED NOT DETECTED Final   Staphylococcus species NOT DETECTED NOT DETECTED Final   Staphylococcus aureus (BCID) NOT DETECTED NOT DETECTED Final   Staphylococcus epidermidis NOT DETECTED NOT DETECTED Final   Staphylococcus lugdunensis NOT DETECTED NOT DETECTED Final    Streptococcus species NOT DETECTED NOT DETECTED Final   Streptococcus agalactiae NOT DETECTED NOT DETECTED Final   Streptococcus pneumoniae NOT DETECTED NOT DETECTED Final   Streptococcus pyogenes NOT DETECTED NOT DETECTED Final   A.calcoaceticus-baumannii NOT DETECTED NOT DETECTED Final   Bacteroides fragilis NOT DETECTED NOT DETECTED Final   Enterobacterales DETECTED (A) NOT DETECTED Final    Comment: Enterobacterales represent a large order of gram negative bacteria, not a single organism. CRITICAL RESULT CALLED TO, READ BACK BY AND VERIFIED WITH: DEVAN MITCHELL PHARMD 1126 12/27/21 HNM    Enterobacter cloacae complex NOT DETECTED NOT DETECTED Final   Escherichia coli DETECTED (A) NOT DETECTED Final    Comment: CRITICAL RESULT CALLED TO, READ BACK BY AND VERIFIED WITH: DEVAN MITCHELL UTMLYY 5035 12/27/21 HNM    Klebsiella aerogenes NOT DETECTED NOT DETECTED Final   Klebsiella oxytoca NOT DETECTED NOT DETECTED Final   Klebsiella pneumoniae NOT DETECTED NOT DETECTED Final   Proteus species NOT DETECTED NOT DETECTED Final   Salmonella species NOT DETECTED NOT DETECTED Final   Serratia marcescens NOT DETECTED NOT DETECTED Final   Haemophilus influenzae NOT DETECTED NOT DETECTED Final   Neisseria meningitidis NOT DETECTED NOT DETECTED Final   Pseudomonas aeruginosa NOT DETECTED NOT DETECTED Final   Stenotrophomonas maltophilia NOT DETECTED NOT DETECTED Final   Candida albicans NOT DETECTED NOT DETECTED Final   Candida auris NOT DETECTED NOT DETECTED Final   Candida glabrata NOT DETECTED NOT DETECTED Final   Candida krusei NOT DETECTED NOT DETECTED Final   Candida parapsilosis NOT DETECTED NOT DETECTED Final   Candida tropicalis NOT DETECTED NOT DETECTED Final   Cryptococcus neoformans/gattii NOT DETECTED NOT DETECTED Final   CTX-M ESBL DETECTED (A) NOT DETECTED Final    Comment: CRITICAL RESULT CALLED TO, READ BACK BY AND VERIFIED WITH: DEVAN MITCHELL PHARMD 1126 12/27/21  HNM (NOTE) Extended spectrum beta-lactamase detected. Recommend a carbapenem as initial therapy.      Carbapenem resistance IMP NOT DETECTED NOT DETECTED Final   Carbapenem resistance KPC NOT DETECTED NOT DETECTED Final   Carbapenem resistance NDM NOT DETECTED NOT DETECTED Final   Carbapenem resist OXA 48 LIKE NOT DETECTED NOT DETECTED Final   Carbapenem resistance VIM NOT DETECTED NOT DETECTED Final    Comment: Performed at [facility], [address] [place], [place] [address]  Urine Culture     Status: Abnormal   Collection Time: 12/27/21  9:59 AM   Specimen: Urine, Clean Catch  Result Value Ref Range Status   Specimen Description   Final    URINE, CLEAN CATCH Performed at  [facility], [address], [place], [place] [address]    Special Requests   Final    NONE Performed at [facility], [address], [place], [place] [address]    Culture (A)  Final    <10,000 COLONIES/mL INSIGNIFICANT GROWTH Performed at [place] [place] [place] [address] [place], [place] [address]    Report Status 12/28/2021 FINAL  Final  Antifungal AST 9 Drug Panel     Status: None   Collection Time: 12/28/21  6:21 AM  Result Value Ref Range Status   Organism ID, Yeast Candida glabrata  Corrected    Comment: (NOTE) Identification performed by account, not confirmed by this laboratory. CORRECTED ON 03/02 AT 1136: PREVIOUSLY REPORTED AS Preliminary report    Amphotericin B MIC 1.0 ug/mL  Final    Comment: (NOTE) Breakpoints have been established for only some organism-drug combinations as indicated. This test was developed and its performance characteristics determined by [facility]. It has not been cleared or approved by the Food and Drug Administration.    Anidulafungin MIC Comment  Final    Comment: (NOTE) 0.06 ug/mL Susceptible Breakpoints have been established for only some organism-drug combinations as indicated. This test was developed and its  performance characteristics determined by [facility]. It has not been cleared or approved by the Food and Drug Administration.    Caspofungin MIC Comment  Final    Comment: (NOTE) 0.12 ug/mL Susceptible Breakpoints have been established for only some organism-drug combinations as indicated. This test was developed and its performance characteristics determined by [facility]. It has not been cleared or approved by the Food and Drug Administration.    Micafungin MIC Comment  Final    Comment: (NOTE) 0.016 ug/mL Susceptible Breakpoints have been established for only some organism-drug combinations as indicated. This test was developed and its performance characteristics determined by [facility]. It has not been cleared or approved by the Food and Drug Administration.    Posaconazole MIC 1.0 ug/mL  Final    Comment: (NOTE) Breakpoints have been established for only some organism-drug combinations as indicated. This test was developed and its performance characteristics determined by [facility]. It has not been cleared or approved by the Food and Drug Administration.    Fluconazole Islt MIC 16.0 ug/mL  Final    Comment: (NOTE) Susceptible Dose Dependent Breakpoints have been established for only some organism-drug combinations as indicated. This test was developed and its performance characteristics determined by [facility]. It has not been cleared or approved by the Food and Drug Administration.    Flucytosine MIC 0.06 ug/mL  Final    Comment: (NOTE) Breakpoints have been established for only some organism-drug combinations as indicated. This test was developed and its performance characteristics determined by [facility]. It has not been cleared or approved by the Food and Drug Administration.    Itraconazole MIC 1.0 ug/mL  Final    Comment: (NOTE) Breakpoints have been established for only some organism-drug combinations as indicated. This test was developed and its performance  characteristics determined by [facility]. It has not been cleared or approved by the Food and Drug Administration.    Voriconazole MIC 0.25 ug/mL  Final    Comment: (NOTE) Breakpoints have been established for only some organism-drug combinations as indicated. This test was developed and its performance characteristics determined by [facility]. It has not been cleared or approved by the Food and Drug Administration. Performed At: [facility] [place], [place] [id number] Rush Farmer MD KG:[id number]  Source CANDIDA GLABRATA FROM BLOOD  Final    Comment: Performed at [facility], [place] [address] [place], [place] [address]  MRSA Next Gen by PCR, Nasal     Status: Abnormal   Collection Time: 12/28/21  3:10 PM   Specimen: Nasal Mucosa; Nasal Swab  Result Value Ref Range Status   MRSA by PCR Next Gen DETECTED (A) NOT DETECTED Final    Comment: RESULT CALLED TO, READ BACK BY AND VERIFIED WITH: GINA SHANNON 1701 12/28/21 MU (NOTE) The GeneXpert MRSA Assay (FDA approved for NASAL specimens only), is one component of a comprehensive MRSA colonization surveillance program. It is not intended to diagnose MRSA infection nor to guide or monitor treatment for MRSA infections. Test performance is not FDA approved in patients less than 76 years old. Performed at [facility], [address] [place], [place] [address]   CULTURE, BLOOD (ROUTINE X 2) w Reflex to ID Panel     Status: None   Collection Time: 12/29/21  6:21 AM   Specimen: BLOOD  Result Value Ref Range Status   Specimen Description BLOOD RIGHT [place]  Final   Special Requests   Final    BOTTLES DRAWN AEROBIC AND ANAEROBIC Blood Culture adequate volume   Culture   Final    NO GROWTH 5 DAYS Performed at [facility], [address], [place], [place] [address]    Report Status 01/03/2022 FINAL  Final  CULTURE, BLOOD (ROUTINE X 2) w Reflex to ID Panel     Status: Abnormal   Collection  Time: 12/29/21  6:21 AM   Specimen: BLOOD  Result Value Ref Range Status   Specimen Description   Final    BLOOD RIGHT HAND Performed at [facility], [address] [place], [place] [address]    Special Requests   Final    IN PEDIATRIC BOTTLE Blood Culture adequate volume Performed at [facility], [address], [place], [place] [address]    Culture  Setup Time   Final    YEAST IN PEDIATRIC BOTTLE Organism ID to follow CRITICAL RESULT CALLED TO, READ BACK BY AND VERIFIED WITH: BRANDON BEERS @1310  12/30/21 SCS    Culture (A)  Final    CANDIDA GLABRATA SEE SEPARATE REPORT Performed at [facility], [place] [address], [place], [place] [address]    Report Status 01/04/2022 FINAL  Final  Blood Culture ID Panel (Reflexed)     Status: Abnormal   Collection Time: 12/29/21  6:21 AM  Result Value Ref Range Status   Enterococcus faecalis NOT DETECTED NOT DETECTED Final   Enterococcus Faecium NOT DETECTED NOT DETECTED Final   Listeria monocytogenes NOT DETECTED NOT DETECTED Final   Staphylococcus species NOT DETECTED NOT DETECTED Final   Staphylococcus aureus (BCID) NOT DETECTED NOT DETECTED Final   Staphylococcus epidermidis NOT DETECTED NOT DETECTED Final   Staphylococcus lugdunensis NOT DETECTED NOT DETECTED Final   Streptococcus species NOT DETECTED NOT DETECTED Final   Streptococcus agalactiae NOT DETECTED NOT DETECTED Final   Streptococcus pneumoniae NOT DETECTED NOT DETECTED Final   Streptococcus pyogenes NOT DETECTED NOT DETECTED Final   A.calcoaceticus-baumannii NOT DETECTED NOT DETECTED Final   Bacteroides fragilis NOT DETECTED NOT DETECTED Final   Enterobacterales NOT DETECTED NOT DETECTED Final   Enterobacter cloacae complex NOT DETECTED NOT DETECTED Final   Escherichia coli NOT DETECTED NOT DETECTED Final   Klebsiella aerogenes NOT DETECTED NOT DETECTED Final   Klebsiella oxytoca NOT DETECTED NOT DETECTED  Final   Klebsiella pneumoniae NOT DETECTED  NOT DETECTED Final   Proteus species NOT DETECTED NOT DETECTED Final   Salmonella species NOT DETECTED NOT DETECTED Final   Serratia marcescens NOT DETECTED NOT DETECTED Final   Haemophilus influenzae NOT DETECTED NOT DETECTED Final   Neisseria meningitidis NOT DETECTED NOT DETECTED Final   Pseudomonas aeruginosa NOT DETECTED NOT DETECTED Final   Stenotrophomonas maltophilia NOT DETECTED NOT DETECTED Final   Candida albicans NOT DETECTED NOT DETECTED Final   Candida auris NOT DETECTED NOT DETECTED Final   Candida glabrata DETECTED (A) NOT DETECTED Final    Comment: CRITICAL RESULT CALLED TO, READ BACK BY AND VERIFIED WITH: BRANDON BEERS @1310  12/30/21 SCS    Candida krusei NOT DETECTED NOT DETECTED Final   Candida parapsilosis NOT DETECTED NOT DETECTED Final   Candida tropicalis NOT DETECTED NOT DETECTED Final   Cryptococcus neoformans/gattii NOT DETECTED NOT DETECTED Final    Comment: Performed at [facility], [address] [place], [place] [address]  CULTURE, BLOOD (ROUTINE X 2) w Reflex to ID Panel     Status: None   Collection Time: 12/31/21  3:08 AM   Specimen: BLOOD  Result Value Ref Range Status   Specimen Description BLOOD BLOOD RIGHT HAND  Final   Special Requests   Final    BOTTLES DRAWN AEROBIC AND ANAEROBIC Blood Culture adequate volume   Culture   Final    NO GROWTH 5 DAYS Performed at [facility], [address] [place], [place] [address]    Report Status 01/05/2022 FINAL  Final  CULTURE, BLOOD (ROUTINE X 2) w Reflex to ID Panel     Status: None   Collection Time: 12/31/21  3:08 AM   Specimen: BLOOD  Result Value Ref Range Status   Specimen Description BLOOD BLOOD LEFT HAND  Final   Special Requests   Final    BOTTLES DRAWN AEROBIC AND ANAEROBIC Blood Culture adequate volume   Culture   Final    NO GROWTH 5 DAYS Performed at [facility], [address] [place], [place] [address]    Report Status 01/05/2022 FINAL  Final   Aerobic/Anaerobic Culture w Gram Stain (surgical/deep wound)     Status: None (Preliminary result)   Collection Time: 01/06/22  3:30 PM   Specimen: Ankle; Wound  Result Value Ref Range Status   Specimen Description   Final    ANKLE Performed at [facility], [address] [place], [place] [address]    Special Requests   Final    Normal Performed at [facility], [address] [place], [place] [address]    Gram Stain   Final    RARE WBC PRESENT, PREDOMINANTLY MONONUCLEAR NO ORGANISMS SEEN    Culture   Final    NO GROWTH < 24 HOURS Performed at [facility], [place] [address], [place], [place] [address]    Report Status PENDING  Incomplete    Coagulation Studies: No results for input(s): LABPROT, INR in the last 72 hours.  Urinalysis: No results for input(s): COLORURINE, LABSPEC, PHURINE, GLUCOSEU, HGBUR, BILIRUBINUR, KETONESUR, PROTEINUR, UROBILINOGEN, NITRITE, LEUKOCYTESUR in the last 72 hours.  Invalid input(s): APPERANCEUR     Imaging: No results found.   Medications:    sodium chloride Stopped (12/30/21 1542)   sodium chloride Stopped (01/02/22 0910)   anidulafungin 100 mg (01/07/22 1211)   ertapenem Stopped (01/07/22 1027)    amiodarone  200 mg Oral Daily   Chlorhexidine  Gluconate Cloth  6 each Topical Daily   enoxaparin (LOVENOX) injection  30 mg Subcutaneous Q24H   insulin aspart  0-15 Units Subcutaneous TID WC   insulin aspart  0-5 Units Subcutaneous QHS   levalbuterol  2 puff Inhalation Q8H   magnesium oxide  400 mg Oral BID   melatonin  5 mg Oral QHS   midodrine  5 mg Oral TID WC   multivitamin with minerals  1 tablet Oral Daily   pantoprazole  40 mg Oral BID   pneumococcal 23 valent vaccine  0.5 mL Intramuscular Tomorrow-1000   sodium chloride flush  3 mL Intravenous Q12H   sodium chloride flush  3 mL Intravenous Q12H   tamsulosin  0.4 mg Oral Daily   tiotropium  1 capsule Inhalation Daily   traZODone  50 mg Oral QHS    sodium chloride, acetaminophen **OR** acetaminophen, menthol-cetylpyridinium, ondansetron **OR** ondansetron (ZOFRAN) IV, oxybutynin, oxyCODONE, sodium chloride flush  Assessment/ Plan:  Mr. Wayne Burns is a 66 y.o.  male with past medical conditions including COPD and hypertension , who was admitted to [facility] on 12/26/2021 for Atrial fibrillation with rapid ventricular response ([place]) [I48.91] Acute cystitis without hematuria [N30.00] AKI (acute kidney injury) ([place]) [N17.9] Septic shock ([place]) [A41.9, R65.21] Sepsis ([place]) [A41.9] Sepsis, due to unspecified organism, unspecified whether acute organ dysfunction present ([place]) [A41.9]   Acute kidney injury with chronic kidney disease stage IV.  Baseline appears to be 2.8 with GFR 25 on 12/22/2021.  AKI may be due to possible obstruction from migrating stent recently placed at [facility].    Renal function decline believed due to diuresis.  Patient received total of 60 mg p.o. torsemide within 24 hours and attempts to improve lower extremity edema.  Adequate urine output noted.  We will continue to monitor renal function and hold diuresis.  Patient encouraged to elevate lower extremity.  Lab Results  Component Value Date   CREATININE 4.75 (H) 01/07/2022   CREATININE 2.71 (H) 01/06/2022   CREATININE 2.62 (H) 01/05/2022    Intake/Output Summary (Last 24 hours) at 01/07/2022 1249 Last data filed at 01/07/2022 1221 Gross per 24 hour  Intake 3042.41 ml  Output 3900 ml  Net -857.59 ml    2.  Hypotension.  Blood pressure stable for this patient  3.  Acute metabolic acidosis.  Serum bicarbonate decreased to 16.  Will monitor  4. Septic shock with ESBL acute cystitis and candida glabrata fungemia.  Recommendation made to continue antibiotics at discharge however unable to secure home health agency to provide services.  As of now patient will remain hospitalized to receive prescribed antibiotics.   5. Lower ex edema Remains, patient encouraged to  elevate    LOS: 11 Lilah Mijangos 3/5/202312:49 PM

## 2022-01-07 NOTE — Progress Notes (Signed)
?Progress Note ? ? ?PatientDonnie Burns DGU:[id number] DOB: 05-04-1956 DOA: 12/26/2021     11 ?DOS: the patient was seen and examined on 01/07/2022 ?  ? ? ?Assessment and Plan: ?* Septic shock ([place]) ?ESBL E. coli acute cystitis and Candida glabrata fungemia.  Patient will be on IV antibiotics through 3/12.  IV Invanz and IV Eraxis. ? ?Candida glabrata infection ?Continue Eraxis through 01/14/2022. ? ?Acute kidney injury superimposed on CKD ([place]) ?Acute kidney injury likely on chronic kidney disease stage IV.  Patient received 3 doses of torsemide and creatinine went up to 4.75 today.  We will give a fluid bolus and recheck creatinine tomorrow.  Previous creatinine 2.71.  Recent creatinine at [facility] on 12/22/2021 was 2.8.  Patient does in and out catheterizations. ? ?Anemia of chronic disease ?Patient transfused on a hemoglobin of 7.1 on 01/03/2022.  Hemoglobin 9.1 today. ? ?Atrial fibrillation with RVR ([place]) ?Continue oral amiodarone.  Cardiology holding off on anticoagulation with the patient's anemia. ? 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  Dr. Bernardo Heater did stent repositioning on 01/02/2022. ? ?Chronic osteomyelitis ([place]) ?Patient has had a chronic wound that is been draining on his right ankle for years.  He stated he was treated with a PICC line and IV antibiotics in the past.  So far wound culture did not show any organisms. ? ?COVID-19 virus infection ?Previous diagnosis at [facility]  Asymptomatic. ? ?Hypomagnesemia ?Continue oral magnesium.  Magnesium low today we will give IV magnesium again. ? ?Elevated troponin ?Elevated troponin to sepsis. ? ? ?Depression ?Seen by psychiatry and no further recommendations.  Patient declined Zoloft. ? ?Hyponatremia ?Sodium 134 ? ?Lower extremity edema ?Ultrasound negative for DVT.  Holding torsemide at this point with acute kidney injury ? ?Hypotension ?Patient on low-dose midodrine. ? ? ? ? ? ? ? ?Subjective: Patient feeling good and offers no  complaints.  Creatinine up to 4.75 today.  States he is eating good.  States he is urinating a little bit on the top of the catheterizations.  Initially admitted with septic shock ? ?Physical Exam: ?Vitals:  ? 01/07/22 0025 01/07/22 0430 01/07/22 9563 01/07/22 1138  ?BP: 121/73 119/75 107/72 118/65  ?Pulse: 77 74 75 79  ?Resp: 18 20 19 18   ?Temp: 97.7 ?F (36.5 ?C) 97.9 ?F (36.6 ?C) 97.9 ?F (36.6 ?C) 97.8 ?F (36.6 ?C)  ?TempSrc: Axillary Axillary Oral   ?SpO2: 97% 98% 100% 100%  ?Weight:      ?Height:      ? ?Physical Exam ?HENT:  ?   Head: Normocephalic.  ?   Mouth/Throat:  ?   Pharynx: No oropharyngeal exudate.  ?Eyes:  ?   General: Lids are normal.  ?   Conjunctiva/sclera: Conjunctivae normal.  ?Cardiovascular:  ?   Rate and Rhythm: Normal rate and regular rhythm.  ?   Heart sounds: Normal heart sounds, S1 normal and S2 normal.  ?Pulmonary:  ?   Breath sounds: No decreased breath sounds, wheezing, rhonchi or rales.  ?Abdominal:  ?   Palpations: Abdomen is soft.  ?   Tenderness: There is no abdominal tenderness.  ?Musculoskeletal:  ?   Right lower leg: Swelling present.  ?   Left lower leg: Swelling present.  ?Skin: ?   General: Skin is warm.  ?   Comments: Chronic wound right lower extremity with some greenish discoloration.  Some bruising in her thighs and on his arms.  ?Neurological:  ?   Mental Status: He is alert.  ?  Comments: Answers all questions appropriately.  ?  ? ?Data Reviewed: ?Laboratory data reviewed from today with creatinine going up to 4.75 and magnesium 1.5 ? ?Disposition: ?Status is: Inpatient ?Remains inpatient appropriate because: Will require inpatient hospitalization through antibiotics on 01/14/2022.  Unable to set up home health. ? ?Planned Discharge Destination: Home ? ?Author: ?Loletha Grayer, MD ?01/07/2022 2:36 PM ? ?For on call review [email or web address].  ? ?

## 2022-01-08 DIAGNOSIS — N179 Acute kidney failure, unspecified: Secondary | ICD-10-CM | POA: Diagnosis not present

## 2022-01-08 DIAGNOSIS — B379 Candidiasis, unspecified: Secondary | ICD-10-CM | POA: Diagnosis not present

## 2022-01-08 DIAGNOSIS — A419 Sepsis, unspecified organism: Secondary | ICD-10-CM | POA: Diagnosis not present

## 2022-01-08 DIAGNOSIS — R7881 Bacteremia: Secondary | ICD-10-CM | POA: Diagnosis not present

## 2022-01-08 LAB — RENAL FUNCTION PANEL
Albumin: 2.5 g/dL — ABNORMAL LOW (ref 3.5–5.0)
Anion gap: 9 (ref 5–15)
BUN: 29 mg/dL — ABNORMAL HIGH (ref 8–23)
CO2: 25 mmol/L (ref 22–32)
Calcium: 8.5 mg/dL — ABNORMAL LOW (ref 8.9–10.3)
Chloride: 100 mmol/L (ref 98–111)
Creatinine, Ser: 2.76 mg/dL — ABNORMAL HIGH (ref 0.61–1.24)
GFR, Estimated: 25 mL/min — ABNORMAL LOW (ref >60)
Glucose, Bld: 107 mg/dL — ABNORMAL HIGH (ref 70–99)
Phosphorus: 4.1 mg/dL (ref 2.5–4.6)
Potassium: 4 mmol/L (ref 3.5–5.1)
Sodium: 134 mmol/L — ABNORMAL LOW (ref 135–145)

## 2022-01-08 LAB — MAGNESIUM: Magnesium: 2 mg/dL (ref 1.7–2.4)

## 2022-01-08 LAB — CBC WITH DIFFERENTIAL/PLATELET
Abs Immature Granulocytes: 0.04 10*3/uL (ref 0.00–0.07)
Basophils Absolute: 0 10*3/uL (ref 0.0–0.1)
Basophils Relative: 0 %
Eosinophils Absolute: 0.2 10*3/uL (ref 0.0–0.5)
Eosinophils Relative: 3 %
HCT: 28.4 % — ABNORMAL LOW (ref 39.0–52.0)
Hemoglobin: 9 g/dL — ABNORMAL LOW (ref 13.0–17.0)
Immature Granulocytes: 1 %
Lymphocytes Relative: 20 %
Lymphs Abs: 1.1 10*3/uL (ref 0.7–4.0)
MCH: 27.9 pg (ref 26.0–34.0)
MCHC: 31.7 g/dL (ref 30.0–36.0)
MCV: 87.9 fL (ref 80.0–100.0)
Monocytes Absolute: 0.4 10*3/uL (ref 0.1–1.0)
Monocytes Relative: 7 %
Neutro Abs: 3.8 10*3/uL (ref 1.7–7.7)
Neutrophils Relative %: 69 %
Platelets: 162 10*3/uL (ref 150–400)
RBC: 3.23 MIL/uL — ABNORMAL LOW (ref 4.22–5.81)
RDW: 17.1 % — ABNORMAL HIGH (ref 11.5–15.5)
WBC: 5.5 10*3/uL (ref 4.0–10.5)
nRBC: 0 % (ref 0.0–0.2)

## 2022-01-08 LAB — GLUCOSE, CAPILLARY
Glucose-Capillary: 90 mg/dL (ref 70–99)
Glucose-Capillary: 118 mg/dL — ABNORMAL HIGH (ref 70–99)

## 2022-01-08 MED ORDER — SODIUM CHLORIDE 0.9 % IV SOLN
1.0000 g | INTRAVENOUS | Status: AC
  Administered 2022-01-08 – 2022-01-14 (×7): 1000 mg via INTRAVENOUS
  Filled 2022-01-08 (×7): qty 1

## 2022-01-08 NOTE — Progress Notes (Signed)
Patient had an uneventful night. C/o pain to BLLE, PRN oxycodone administered with positive effects noted. Frequent rest for food, snack, and drinks. Non-compliant with safety and refused to keep bed alarm. Education provided, however patient continue to refuse. Call bell kept within reach. I&Os in flow sheets.  ?

## 2022-01-08 NOTE — Progress Notes (Signed)
[facility]  ROUNDING NOTE   Subjective:   Patient seen sitting at side of bed, alert and oriented Tolerating meals without nausea and vomiting Denies shortness of breath Lower extremity edema improved  03/05 0701 - 03/06 0700 In: 1982.4 [P.O.:1080; IV Piggyback:902.4] Out: 5350 [Urine:5350] Lab Results  Component Value Date   CREATININE 2.76 (H) 01/08/2022   CREATININE 4.75 (H) 01/07/2022   CREATININE 2.71 (H) 01/06/2022     Objective:  Vital signs in last 24 hours:  Temp:  [97.6 F (36.4 C)-97.9 F (36.6 C)] 97.9 F (36.6 C) (03/06 0750) Pulse Rate:  [67-76] 68 (03/06 0750) Resp:  [17-18] 17 (03/06 0750) BP: (110-118)/(72-80) 118/76 (03/06 0750) SpO2:  [98 %-100 %] 98 % (03/06 0750)  Weight change:  Filed Weights   12/28/21 1500 12/31/21 1741 01/02/22 1118  Weight: 92.6 kg 91.4 kg 91.4 kg    Intake/Output: I/O last 3 completed shifts: In: 3782.4 [P.O.:2880; IV Piggyback:902.4] Out: 8000 [Urine:8000]   Intake/Output this shift:  Total I/O In: 620 [P.O.:620] Out: -   Physical Exam: General: NAD  Head: Normocephalic, atraumatic. Moist oral mucosal membranes  Eyes: Anicteric  Lungs:  Normal effort, clear to auscultation  Heart: Regular rate and rhythm  Abdomen:  Soft, nontender, obese  Extremities: 2+ peripheral edema.  Neurologic: Nonfocal, moving all four extremities  Skin: No acute skin rash       Basic Metabolic Panel: Recent Labs  Lab 01/04/22 0626 01/05/22 0521 01/06/22 0544 01/07/22 0442 01/08/22 0509  NA 134* 134* 135 140 134*  K 4.2 4.3 3.8 4.0 4.0  CL 98 100 102 112* 100  CO2 25 25 26  16* 25  GLUCOSE 100* 101* 123* 111* 107*  BUN 33* 31* 28* 117* 29*  CREATININE 2.63* 2.62* 2.71* 4.75* 2.76*  CALCIUM 8.4* 8.5* 8.1* 8.4* 8.5*  MG 1.6* 2.0 1.4* 1.5* 2.0  PHOS 3.6 3.8 3.8 3.2 4.1     Liver Function Tests: Recent Labs  Lab 01/04/22 0626 01/05/22 0521 01/06/22 0544 01/07/22 0442 01/08/22 0509  ALBUMIN 2.2* 2.2*  2.1* 2.0* 2.5*    No results for input(s): LIPASE, AMYLASE in the last 168 hours. No results for input(s): AMMONIA in the last 168 hours.  CBC: Recent Labs  Lab 01/04/22 0626 01/05/22 0521 01/06/22 0544 01/07/22 0442 01/08/22 0509  WBC 5.5 5.9 5.4 6.5 5.5  NEUTROABS 4.1 4.3 3.8 4.5 3.8  HGB 9.0* 8.7* 8.5* 9.1* 9.0*  HCT 27.9* 27.2* 27.1* 28.7* 28.4*  MCV 87.5 88.0 88.6 87.8 87.9  PLT 156 152 138* 155 162     Cardiac Enzymes: No results for input(s): CKTOTAL, CKMB, CKMBINDEX, TROPONINI in the last 168 hours.   BNP: Invalid input(s): POCBNP  CBG: Recent Labs  Lab 01/07/22 0804 01/07/22 1139 01/07/22 1640 01/07/22 2135 01/08/22 0829  GLUCAP 85 138* 122* 123* 90     Microbiology: Results for orders placed or performed during the hospital encounter of 12/26/21  Resp Panel by RT-PCR (Flu A&B, Covid)     Status: Abnormal   Collection Time: 12/26/21 10:33 PM   Specimen: Nasopharyngeal(NP) swabs in vial transport medium  Result Value Ref Range Status   SARS Coronavirus 2 by RT PCR POSITIVE (A) NEGATIVE Final    Comment: (NOTE) SARS-CoV-2 target nucleic acids are DETECTED.  The SARS-CoV-2 RNA is generally detectable in upper respiratory specimens during the acute phase of infection. Positive results are indicative of the presence of the identified virus, but do not rule out bacterial infection or co-infection  with other pathogens not detected by the test. Clinical correlation with patient history and other diagnostic information is necessary to determine patient infection status. The expected result is Negative.  Fact Sheet for Patients: EntrepreneurPulse.com.au  Fact Sheet for Healthcare Providers: IncredibleEmployment.be  This test is not yet approved or cleared by the [place] FDA and  has been authorized for detection and/or diagnosis of SARS-CoV-2 by FDA under an Emergency Use Authorization (EUA).  This EUA  will remain in effect (meaning this test can be used) for the duration of  the COVID-19 declaration under Section 564(b)(1) of the A ct, 21 U.S.C. section 360bbb-3(b)(1), unless the authorization is terminated or revoked sooner.     Influenza A by PCR NEGATIVE NEGATIVE Final   Influenza B by PCR NEGATIVE NEGATIVE Final    Comment: (NOTE) The Xpert Xpress SARS-CoV-2/FLU/RSV plus assay is intended as an aid in the diagnosis of influenza from Nasopharyngeal swab specimens and should not be used as a sole basis for treatment. Nasal washings and aspirates are unacceptable for Xpert Xpress SARS-CoV-2/FLU/RSV testing.  Fact Sheet for Patients: EntrepreneurPulse.com.au  Fact Sheet for Healthcare Providers: IncredibleEmployment.be  This test is not yet approved or cleared by the [place] FDA and has been authorized for detection and/or diagnosis of SARS-CoV-2 by FDA under an Emergency Use Authorization (EUA). This EUA will remain in effect (meaning this test can be used) for the duration of the COVID-19 declaration under Section 564(b)(1) of the Act, 21 U.S.C. section 360bbb-3(b)(1), unless the authorization is terminated or revoked.  Performed at [facility], [address] [place], [place] [address]   Blood culture (routine x 2)     Status: None   Collection Time: 12/26/21 10:33 PM   Specimen: BLOOD  Result Value Ref Range Status   Specimen Description BLOOD LEFT ASSIST CONTROL  Final   Special Requests   Final    BOTTLES DRAWN AEROBIC AND ANAEROBIC Blood Culture adequate volume   Culture   Final    NO GROWTH 5 DAYS Performed at [facility] [address], [place], [place] [address]    Report Status 12/31/2021 FINAL  Final  Blood culture (routine x 2)     Status: Abnormal   Collection Time: 12/26/21 10:46 PM   Specimen: BLOOD  Result Value Ref Range Status   Specimen Description   Final    BLOOD RIGHT  ANTECUBITAL Performed at [facility], [place] [address] [place], [place] [address]    Special Requests   Final    BOTTLES DRAWN AEROBIC AND ANAEROBIC Blood Culture adequate volume Performed at [facility], [address] [place], [place] [address]    Culture  Setup Time   Final    GRAM NEGATIVE RODS ANAEROBIC BOTTLE ONLY Organism ID to follow CRITICAL RESULT CALLED TO, READ BACK BY AND VERIFIED WITH: [facility] MITCHELL [id number] 12/27/21 HNM    Culture (A)  Final    ESCHERICHIA COLI Confirmed Extended Spectrum Beta-Lactamase Producer (ESBL).  In bloodstream infections from ESBL organisms, carbapenems are preferred over piperacillin/tazobactam. They are shown to have a lower risk of mortality.    Report Status 12/29/2021 FINAL  Final   Organism ID, Bacteria ESCHERICHIA COLI  Final      Susceptibility   Escherichia coli - MIC*    AMPICILLIN >=32 RESISTANT Resistant     CEFAZOLIN >=64 RESISTANT Resistant     CEFEPIME 16 RESISTANT Resistant     CEFTAZIDIME RESISTANT Resistant  CEFTRIAXONE >=64 RESISTANT Resistant     CIPROFLOXACIN <=0.25 SENSITIVE Sensitive     GENTAMICIN <=1 SENSITIVE Sensitive     IMIPENEM <=0.25 SENSITIVE Sensitive     TRIMETH/SULFA <=20 SENSITIVE Sensitive     AMPICILLIN/SULBACTAM 8 SENSITIVE Sensitive     PIP/TAZO <=4 SENSITIVE Sensitive     * ESCHERICHIA COLI  Blood Culture ID Panel (Reflexed)     Status: Abnormal   Collection Time: 12/26/21 10:46 PM  Result Value Ref Range Status   Enterococcus faecalis NOT DETECTED NOT DETECTED Final   Enterococcus Faecium NOT DETECTED NOT DETECTED Final   Listeria monocytogenes NOT DETECTED NOT DETECTED Final   Staphylococcus species NOT DETECTED NOT DETECTED Final   Staphylococcus aureus (BCID) NOT DETECTED NOT DETECTED Final   Staphylococcus epidermidis NOT DETECTED NOT DETECTED Final   Staphylococcus lugdunensis NOT DETECTED NOT DETECTED Final   Streptococcus species NOT DETECTED NOT DETECTED Final    Streptococcus agalactiae NOT DETECTED NOT DETECTED Final   Streptococcus pneumoniae NOT DETECTED NOT DETECTED Final   Streptococcus pyogenes NOT DETECTED NOT DETECTED Final   A.calcoaceticus-baumannii NOT DETECTED NOT DETECTED Final   Bacteroides fragilis NOT DETECTED NOT DETECTED Final   Enterobacterales DETECTED (A) NOT DETECTED Final    Comment: Enterobacterales represent a large order of gram negative bacteria, not a single organism. CRITICAL RESULT CALLED TO, READ BACK BY AND VERIFIED WITH: DEVAN MITCHELL PHARMD [id number] 12/27/21 HNM    Enterobacter cloacae complex NOT DETECTED NOT DETECTED Final   Escherichia coli DETECTED (A) NOT DETECTED Final    Comment: CRITICAL RESULT CALLED TO, READ BACK BY AND VERIFIED WITH: DEVAN MITCHELL [id number] 12/27/21 HNM    Klebsiella aerogenes NOT DETECTED NOT DETECTED Final   Klebsiella oxytoca NOT DETECTED NOT DETECTED Final   Klebsiella pneumoniae NOT DETECTED NOT DETECTED Final   Proteus species NOT DETECTED NOT DETECTED Final   Salmonella species NOT DETECTED NOT DETECTED Final   Serratia marcescens NOT DETECTED NOT DETECTED Final   Haemophilus influenzae NOT DETECTED NOT DETECTED Final   Neisseria meningitidis NOT DETECTED NOT DETECTED Final   Pseudomonas aeruginosa NOT DETECTED NOT DETECTED Final   Stenotrophomonas maltophilia NOT DETECTED NOT DETECTED Final   Candida albicans NOT DETECTED NOT DETECTED Final   Candida auris NOT DETECTED NOT DETECTED Final   Candida glabrata NOT DETECTED NOT DETECTED Final   Candida krusei NOT DETECTED NOT DETECTED Final   Candida parapsilosis NOT DETECTED NOT DETECTED Final   Candida tropicalis NOT DETECTED NOT DETECTED Final   Cryptococcus neoformans/gattii NOT DETECTED NOT DETECTED Final   CTX-M ESBL DETECTED (A) NOT DETECTED Final    Comment: CRITICAL RESULT CALLED TO, READ BACK BY AND VERIFIED WITH: DEVAN MITCHELL PHARMD 1126 12/27/21 HNM (NOTE) Extended spectrum beta-lactamase detected. Recommend a  carbapenem as initial therapy.      Carbapenem resistance IMP NOT DETECTED NOT DETECTED Final   Carbapenem resistance KPC NOT DETECTED NOT DETECTED Final   Carbapenem resistance NDM NOT DETECTED NOT DETECTED Final   Carbapenem resist OXA 48 LIKE NOT DETECTED NOT DETECTED Final   Carbapenem resistance VIM NOT DETECTED NOT DETECTED Final    Comment: Performed at [facility], [address], [place], [place] [address]  Urine Culture     Status: Abnormal   Collection Time: 12/27/21  9:59 AM   Specimen: Urine, Clean Catch  Result Value Ref Range Status   Specimen Description   Final    URINE, CLEAN CATCH Performed at [facility], [address],  [place], [place] [address]    Special Requests   Final    NONE Performed at [facility], [address] [place], [place] [address]    Culture (A)  Final    <10,000 COLONIES/mL INSIGNIFICANT GROWTH Performed at [facility] [address], [place], [place] [address]    Report Status 12/28/2021 FINAL  Final  Antifungal AST 9 Drug Panel     Status: None   Collection Time: 12/28/21  6:21 AM  Result Value Ref Range Status   Organism ID, Yeast Candida glabrata  Corrected    Comment: (NOTE) Identification performed by account, not confirmed by this laboratory. CORRECTED ON 03/02 AT 1136: PREVIOUSLY REPORTED AS Preliminary report    Amphotericin B MIC 1.0 ug/mL  Final    Comment: (NOTE) Breakpoints have been established for only some organism-drug combinations as indicated. This test was developed and its performance characteristics determined by [facility]. It has not been cleared or approved by the Food and Drug Administration.    Anidulafungin MIC Comment  Final    Comment: (NOTE) 0.06 ug/mL Susceptible Breakpoints have been established for only some organism-drug combinations as indicated. This test was developed and its performance characteristics determined by [facility]. It has not been cleared  or approved by the Food and Drug Administration.    Caspofungin MIC Comment  Final    Comment: (NOTE) 0.12 ug/mL Susceptible Breakpoints have been established for only some organism-drug combinations as indicated. This test was developed and its performance characteristics determined by [facility]. It has not been cleared or approved by the Food and Drug Administration.    Micafungin MIC Comment  Final    Comment: (NOTE) 0.016 ug/mL Susceptible Breakpoints have been established for only some organism-drug combinations as indicated. This test was developed and its performance characteristics determined by [facility]. It has not been cleared or approved by the Food and Drug Administration.    Posaconazole MIC 1.0 ug/mL  Final    Comment: (NOTE) Breakpoints have been established for only some organism-drug combinations as indicated. This test was developed and its performance characteristics determined by [facility]. It has not been cleared or approved by the Food and Drug Administration.    Fluconazole Islt MIC 16.0 ug/mL  Final    Comment: (NOTE) Susceptible Dose Dependent Breakpoints have been established for only some organism-drug combinations as indicated. This test was developed and its performance characteristics determined by [facility]. It has not been cleared or approved by the Food and Drug Administration.    Flucytosine MIC 0.06 ug/mL  Final    Comment: (NOTE) Breakpoints have been established for only some organism-drug combinations as indicated. This test was developed and its performance characteristics determined by [facility]. It has not been cleared or approved by the Food and Drug Administration.    Itraconazole MIC 1.0 ug/mL  Final    Comment: (NOTE) Breakpoints have been established for only some organism-drug combinations as indicated. This test was developed and its performance characteristics determined by [facility]. It has not been cleared or approved by the  Food and Drug Administration.    Voriconazole MIC 0.25 ug/mL  Final    Comment: (NOTE) Breakpoints have been established for only some organism-drug combinations as indicated. This test was developed and its performance characteristics determined by [facility]. It has not been cleared or approved by the Food and Drug Administration. Performed At: [facility] [address] [place], [place] [id number] [place] MD XT:[id number]    Source CANDIDA GLABRATA FROM BLOOD  Final    Comment: Performed at [facility], [place] [address], [place], [place] [address]  MRSA Next Gen by PCR, Nasal     Status: Abnormal   Collection Time: 12/28/21  3:10 PM   Specimen: Nasal Mucosa; Nasal Swab  Result Value Ref Range Status   MRSA by PCR Next Gen DETECTED (A) NOT DETECTED Final    Comment: RESULT CALLED TO, READ BACK BY AND VERIFIED WITH: GINA SHANNON 1701 12/28/21 MU (NOTE) The GeneXpert MRSA Assay (FDA approved for NASAL specimens only), is one component of a comprehensive MRSA colonization surveillance program. It is not intended to diagnose MRSA infection nor to guide or monitor treatment for MRSA infections. Test performance is not FDA approved in patients less than 102 years old. Performed at [facility], [address] [place], [place] [address]   CULTURE, BLOOD (ROUTINE X 2) w Reflex to ID Panel     Status: None   Collection Time: 12/29/21  6:21 AM   Specimen: BLOOD  Result Value Ref Range Status   Specimen Description BLOOD RIGHT [facility]  Final   Special Requests   Final    BOTTLES DRAWN AEROBIC AND ANAEROBIC Blood Culture adequate volume   Culture   Final    NO GROWTH 5 DAYS Performed at [facility], [address], [place], [place] [address]    Report Status 01/03/2022 FINAL  Final  CULTURE, BLOOD (ROUTINE X 2) w Reflex to ID Panel     Status: Abnormal   Collection Time: 12/29/21  6:21 AM   Specimen: BLOOD  Result Value Ref Range Status   Specimen  Description   Final    BLOOD RIGHT HAND Performed at [facility], [address], [place], [place] [address]    Special Requests   Final    IN PEDIATRIC BOTTLE Blood Culture adequate volume Performed at [facility], [address] [place], [place] [address]    Culture  Setup Time   Final    YEAST IN PEDIATRIC BOTTLE Organism ID to follow CRITICAL RESULT CALLED TO, READ BACK BY AND VERIFIED WITH: BRANDON BEERS @1310  12/30/21 SCS    Culture (A)  Final    CANDIDA GLABRATA SEE SEPARATE REPORT Performed at [facility], [place] [address] [place],  [address]    Report Status 01/04/2022 FINAL  Final  Blood Culture ID Panel (Reflexed)     Status: Abnormal   Collection Time: 12/29/21  6:21 AM  Result Value Ref Range Status   Enterococcus faecalis NOT DETECTED NOT DETECTED Final   Enterococcus Faecium NOT DETECTED NOT DETECTED Final   Listeria monocytogenes NOT DETECTED NOT DETECTED Final   Staphylococcus species NOT DETECTED NOT DETECTED Final   Staphylococcus aureus (BCID) NOT DETECTED NOT DETECTED Final   Staphylococcus epidermidis NOT DETECTED NOT DETECTED Final   Staphylococcus lugdunensis NOT DETECTED NOT DETECTED Final   Streptococcus species NOT DETECTED NOT DETECTED Final   Streptococcus agalactiae NOT DETECTED NOT DETECTED Final   Streptococcus pneumoniae NOT DETECTED NOT DETECTED Final   Streptococcus pyogenes NOT DETECTED NOT DETECTED Final   A.calcoaceticus-baumannii NOT DETECTED NOT DETECTED Final   Bacteroides fragilis NOT DETECTED NOT DETECTED Final   Enterobacterales NOT DETECTED NOT DETECTED Final   Enterobacter cloacae complex NOT DETECTED NOT DETECTED Final   Escherichia coli NOT DETECTED NOT DETECTED Final   Klebsiella aerogenes NOT DETECTED NOT DETECTED Final   Klebsiella oxytoca NOT DETECTED NOT DETECTED Final   Klebsiella pneumoniae NOT  DETECTED NOT DETECTED Final   Proteus species NOT DETECTED NOT DETECTED Final   Salmonella  species NOT DETECTED NOT DETECTED Final   Serratia marcescens NOT DETECTED NOT DETECTED Final   Haemophilus influenzae NOT DETECTED NOT DETECTED Final   Neisseria meningitidis NOT DETECTED NOT DETECTED Final   Pseudomonas aeruginosa NOT DETECTED NOT DETECTED Final   Stenotrophomonas maltophilia NOT DETECTED NOT DETECTED Final   Candida albicans NOT DETECTED NOT DETECTED Final   Candida auris NOT DETECTED NOT DETECTED Final   Candida glabrata DETECTED (A) NOT DETECTED Final    Comment: CRITICAL RESULT CALLED TO, READ BACK BY AND VERIFIED WITH: BRANDON BEERS @1310  12/30/21 SCS    Candida krusei NOT DETECTED NOT DETECTED Final   Candida parapsilosis NOT DETECTED NOT DETECTED Final   Candida tropicalis NOT DETECTED NOT DETECTED Final   Cryptococcus neoformans/gattii NOT DETECTED NOT DETECTED Final    Comment: Performed at [facility], [address] [place], [place] [address]  CULTURE, BLOOD (ROUTINE X 2) w Reflex to ID Panel     Status: None   Collection Time: 12/31/21  3:08 AM   Specimen: BLOOD  Result Value Ref Range Status   Specimen Description BLOOD BLOOD RIGHT HAND  Final   Special Requests   Final    BOTTLES DRAWN AEROBIC AND ANAEROBIC Blood Culture adequate volume   Culture   Final    NO GROWTH 5 DAYS Performed at [facility], [address] [place], [place] [address]    Report Status 01/05/2022 FINAL  Final  CULTURE, BLOOD (ROUTINE X 2) w Reflex to ID Panel     Status: None   Collection Time: 12/31/21  3:08 AM   Specimen: BLOOD  Result Value Ref Range Status   Specimen Description BLOOD BLOOD LEFT HAND  Final   Special Requests   Final    BOTTLES DRAWN AEROBIC AND ANAEROBIC Blood Culture adequate volume   Culture   Final    NO GROWTH 5 DAYS Performed at [facility], [address] [place], [place] [address]    Report Status 01/05/2022 FINAL  Final  Aerobic/Anaerobic Culture w Gram Stain (surgical/deep wound)     Status: None  (Preliminary result)   Collection Time: 01/06/22  3:30 PM   Specimen: Ankle; Wound  Result Value Ref Range Status   Specimen Description   Final    ANKLE Performed at [facility], [address] [place], [place] [address]    Special Requests   Final    Normal Performed at [facility], [address] [place], [place] [address]    Gram Stain   Final    RARE WBC PRESENT, PREDOMINANTLY MONONUCLEAR NO ORGANISMS SEEN    Culture   Final    NO GROWTH 2 DAYS Performed at [facility], [place] [address], [place], [place] [address]    Report Status PENDING  Incomplete    Coagulation Studies: No results for input(s): LABPROT, INR in the last 72 hours.  Urinalysis: No results for input(s): COLORURINE, LABSPEC, PHURINE, GLUCOSEU, HGBUR, BILIRUBINUR, KETONESUR, PROTEINUR, UROBILINOGEN, NITRITE, LEUKOCYTESUR in the last 72 hours.  Invalid input(s): APPERANCEUR     Imaging: No results found.   Medications:    sodium chloride Stopped (12/30/21 1542)   sodium chloride Stopped (01/02/22 0910)   anidulafungin 100 mg (01/08/22 1239)   ertapenem 1,000 mg (01/08/22 1134)    amiodarone  200 mg Oral Daily   Chlorhexidine Gluconate Cloth  6 each Topical  Daily   enoxaparin (LOVENOX) injection  30 mg Subcutaneous Q24H   levalbuterol  2 puff Inhalation Q8H   magnesium oxide  400 mg Oral BID   melatonin  5 mg Oral QHS   midodrine  5 mg Oral TID WC   multivitamin with minerals  1 tablet Oral Daily   pantoprazole  40 mg Oral BID   pneumococcal 23 valent vaccine  0.5 mL Intramuscular Tomorrow-1000   sodium chloride flush  3 mL Intravenous Q12H   sodium chloride flush  3 mL Intravenous Q12H   tamsulosin  0.4 mg Oral Daily   tiotropium  1 capsule Inhalation Daily   traZODone  50 mg Oral QHS   sodium chloride, acetaminophen **OR** acetaminophen, menthol-cetylpyridinium, ondansetron **OR** ondansetron (ZOFRAN) IV, oxybutynin, oxyCODONE, sodium chloride  flush  Assessment/ Plan:  Mr. Wayne [place] is a 66 y.o.  male with past medical conditions including COPD and hypertension , who was admitted to [facility] on 12/26/2021 for Atrial fibrillation with rapid ventricular response ([place]) [I48.91] Acute cystitis without hematuria [N30.00] AKI (acute kidney injury) ([place]) [N17.9] Septic shock ([place]) [A41.9, R65.21] Sepsis ([place]) [A41.9] Sepsis, due to unspecified organism, unspecified whether acute organ dysfunction present ([place]) [A41.9]   Acute kidney injury with chronic kidney disease stage IV.  Baseline appears to be 2.8 with GFR 25 on 12/22/2021.  AKI may be due to possible obstruction from migrating stent recently placed at [facility].    Renal function has improved and returned to baseline.  Adequate urine output recorded.  We will continue to monitor renal function but may not visit daily.  Lab Results  Component Value Date   CREATININE 2.76 (H) 01/08/2022   CREATININE 4.75 (H) 01/07/2022   CREATININE 2.71 (H) 01/06/2022    Intake/Output Summary (Last 24 hours) at 01/08/2022 1528 Last data filed at 01/08/2022 1053 Gross per 24 hour  Intake 860 ml  Output 4100 ml  Net -3240 ml    2.  Hypotension.  Blood pressure stable f  3.  Acute metabolic acidosis.  Resolved  4. Septic shock with ESBL acute cystitis and candida glabrata fungemia.  Recommendation made to continue antibiotics at discharge however unable to secure home health agency to provide services.    Scheduled to remain hospitalized until 01/14/2022 to complete antibiotic course.   5. Lower ex edema  Improved, encourage patient to elevate lower extremities    LOS: 12 [place]  3/6/20233:28 PM

## 2022-01-08 NOTE — Progress Notes (Signed)
?Progress Note ? ? ?PatientRic Burns EAV:[id number] DOB: July 12, 1956 DOA: 12/26/2021     12 ?DOS: the patient was seen and examined on 01/08/2022 ?  ? ? ?Assessment and Plan: ?* Septic shock ([place]) ?ESBL E. coli acute cystitis and Candida glabrata fungemia.  Patient will be on IV antibiotics through 3/12.  IV Invanz and IV Eraxis. ? ?Candida glabrata infection ?Continue Eraxis through 01/14/2022. ? ?Acute kidney injury superimposed on CKD ([place]) ?Acute kidney injury likely on chronic kidney disease stage IV.  Patient received 3 doses of torsemide and creatinine went up to 4.75 yesterday.  The patient was given a fluid bolus and creatinine came down to 2.76.  Recent creatinine at [facility] on 12/22/2021 was 2.8.  Patient does in and out catheterizations. ? ?Anemia of chronic disease ?Patient transfused on a hemoglobin of 7.1 on 01/03/2022.  Hemoglobin 9.0 today. ? ?Atrial fibrillation with RVR ([place]) ?Continue oral amiodarone.  Cardiology holding off on anticoagulation with the patient's anemia. ? ?Displacement of ureteral stent ([place]) ?Patient had right stent malposition.  Dr. Bernardo Heater did stent repositioning on 01/02/2022. ? ?Chronic osteomyelitis ([place]) ?Patient has had a chronic wound that is been draining on his right ankle for years.  He stated he was treated with a PICC line and IV antibiotics in the past.  So far wound culture did not show any organisms. ? ?COVID-19 virus infection ?Previous diagnosis at [facility].  Asymptomatic. ? ?Hypomagnesemia ?Continue oral magnesium.  Magnesium normal range today. ? ?Elevated troponin ?Elevated troponin to sepsis. ? ? ?Depression ?Seen by psychiatry and no further recommendations.  Patient declined Zoloft. ? ?Hyponatremia ?Sodium 134 ? ?Lower extremity edema ?Ultrasound negative for DVT.  Holding torsemide at this point with acute kidney injury ? ?Hypotension ?Patient on low-dose midodrine. ? ? ? ? ?  ? ?Subjective: Patient seen and upset that eating Breakfast this morning around 10 AM when I  saw him.  We called the meal number while is in the room and he was able to get in contact with dietary and get some breakfast ordered up.  Otherwise he feels good.  Still having some swelling in his legs.  Admitted with septic shock. ? ?Physical Exam: ?Vitals:  ? 01/07/22 2317 01/08/22 0331 01/08/22 0750 01/08/22 1529  ?BP: 114/72 110/74 118/76 109/71  ?Pulse: 67 70 68 80  ?Resp: 17 17 17    ?Temp: 97.8 ?F (36.6 ?C) 97.7 ?F (36.5 ?C) 97.9 ?F (36.6 ?C) 97.8 ?F (36.6 ?C)  ?TempSrc:   Oral Oral  ?SpO2: 98% 98% 98% 95%  ?Weight:      ?Height:      ? ?Physical Exam ?HENT:  ?   Head: Normocephalic.  ?   Mouth/Throat:  ?   Pharynx: No oropharyngeal exudate.  ?Eyes:  ?   General: Lids are normal.  ?   Conjunctiva/sclera: Conjunctivae normal.  ?Cardiovascular:  ?   Rate and Rhythm: Normal rate and regular rhythm.  ?   Heart sounds: Normal heart sounds, S1 normal and S2 normal.  ?Pulmonary:  ?   Breath sounds: No decreased breath sounds, wheezing, rhonchi or rales.  ?Abdominal:  ?   Palpations: Abdomen is soft.  ?   Tenderness: There is no abdominal tenderness.  ?Musculoskeletal:  ?   Right lower leg: Swelling present.  ?   Left lower leg: Swelling present.  ?Skin: ?   General: Skin is warm.  ?   Comments: Chronic wound right lower extremity covered this morning with bandage.  ?Neurological:  ?  Mental Status: He is alert.  ?   Comments: Answers all questions appropriately.  ?  ?Data Reviewed: ?Today's laboratory data reviewed.  Creatinine back down to 2.76, sodium 134 and hemoglobin 9.0 ?Family Communication: Left message for brother ? ?Disposition: ?Status is: Inpatient ?Remains inpatient appropriate because: Requires IV antibiotics through 01/14/2022.  Unable to set up home health to do this at home. ?Planned Discharge Destination: Home ? ? ?Author: ?Loletha Grayer, MD ?01/08/2022 3:53 PM ? ?For on call review [email or web address].  ?

## 2022-01-08 NOTE — Progress Notes (Signed)
PHARMACY NOTE:  ANTIMICROBIAL RENAL DOSAGE ADJUSTMENT ? ?Current antimicrobial regimen includes a mismatch between antimicrobial dosage and estimated renal function.  As per policy approved by the Pharmacy & Therapeutics and Medical Executive Committees, the antimicrobial dosage will be adjusted accordingly. ? ?Current antimicrobial dosage:  ertapenem 500mg  IV every 24 hours ? ?Indication: ESBL E. coli bacteremia ? ?Renal Function: ? ?Estimated Creatinine Clearance: 30.6 mL/min (A) (by C-G formula based on SCr of 2.76 mg/dL (H)). ? ?   ?Antimicrobial dosage has been changed to:  Ertapenem 1000mg  IV every 24 hours ? ?Additional comments:Suspect lab error from 3/5 labs ? ? ?Thank you for allowing pharmacy to be a part of this patient's care. ? ?Doreene Eland, PharmD, BCPS, BCIDP ?Work Cell: [phone] ?01/08/2022 8:50 AM ? ? ? ?

## 2022-01-08 NOTE — Progress Notes (Signed)
Went in patients room to saline lock IV, patient was bent over side of bed trying to cut dressing off his leg with a plastic knife that came on his lunch tray. Patient educated on the importance of keeping skin intact to prevent further complications. Patient requested dressing to be removed. ? ?Omeed Osuna, Tivis Ringer, RN ? ?

## 2022-01-08 NOTE — Plan of Care (Signed)
  Problem: Education: Goal: Knowledge of General Education information will improve Description Including pain rating scale, medication(s)/side effects and non-pharmacologic comfort measures Outcome: Progressing   Problem: Education: Goal: Knowledge of General Education information will improve Description Including pain rating scale, medication(s)/side effects and non-pharmacologic comfort measures Outcome: Progressing   

## 2022-01-08 NOTE — Progress Notes (Addendum)
Patient did self cath before he was even bladder scanned, asked patient did he save the urine so we could record output and he stated it was 1/2 bag.  ? ?Carla Whilden, Tivis Ringer, RN ? ?

## 2022-01-09 DIAGNOSIS — B379 Candidiasis, unspecified: Secondary | ICD-10-CM | POA: Diagnosis not present

## 2022-01-09 DIAGNOSIS — R7881 Bacteremia: Secondary | ICD-10-CM | POA: Diagnosis not present

## 2022-01-09 DIAGNOSIS — N179 Acute kidney failure, unspecified: Secondary | ICD-10-CM | POA: Diagnosis not present

## 2022-01-09 DIAGNOSIS — A419 Sepsis, unspecified organism: Secondary | ICD-10-CM | POA: Diagnosis not present

## 2022-01-09 LAB — RENAL FUNCTION PANEL
Albumin: 2.7 g/dL — ABNORMAL LOW (ref 3.5–5.0)
Anion gap: 12 (ref 5–15)
BUN: 27 mg/dL — ABNORMAL HIGH (ref 8–23)
CO2: 24 mmol/L (ref 22–32)
Calcium: 8.9 mg/dL (ref 8.9–10.3)
Chloride: 98 mmol/L (ref 98–111)
Creatinine, Ser: 2.78 mg/dL — ABNORMAL HIGH (ref 0.61–1.24)
GFR, Estimated: 25 mL/min — ABNORMAL LOW (ref >60)
Glucose, Bld: 95 mg/dL (ref 70–99)
Phosphorus: 4.1 mg/dL (ref 2.5–4.6)
Potassium: 4 mmol/L (ref 3.5–5.1)
Sodium: 134 mmol/L — ABNORMAL LOW (ref 135–145)

## 2022-01-09 LAB — CBC WITH DIFFERENTIAL/PLATELET
Abs Immature Granulocytes: 0.03 10*3/uL (ref 0.00–0.07)
Basophils Absolute: 0 10*3/uL (ref 0.0–0.1)
Basophils Relative: 0 %
Eosinophils Absolute: 0.2 10*3/uL (ref 0.0–0.5)
Eosinophils Relative: 4 %
HCT: 28.8 % — ABNORMAL LOW (ref 39.0–52.0)
Hemoglobin: 9 g/dL — ABNORMAL LOW (ref 13.0–17.0)
Immature Granulocytes: 1 %
Lymphocytes Relative: 19 %
Lymphs Abs: 1.1 10*3/uL (ref 0.7–4.0)
MCH: 27.8 pg (ref 26.0–34.0)
MCHC: 31.3 g/dL (ref 30.0–36.0)
MCV: 88.9 fL (ref 80.0–100.0)
Monocytes Absolute: 0.4 10*3/uL (ref 0.1–1.0)
Monocytes Relative: 7 %
Neutro Abs: 4 10*3/uL (ref 1.7–7.7)
Neutrophils Relative %: 69 %
Platelets: 161 10*3/uL (ref 150–400)
RBC: 3.24 MIL/uL — ABNORMAL LOW (ref 4.22–5.81)
RDW: 17.2 % — ABNORMAL HIGH (ref 11.5–15.5)
WBC: 5.7 10*3/uL (ref 4.0–10.5)
nRBC: 0 % (ref 0.0–0.2)

## 2022-01-09 LAB — MAGNESIUM: Magnesium: 1.8 mg/dL (ref 1.7–2.4)

## 2022-01-09 MED ORDER — HYDROCERIN EX CREA
TOPICAL_CREAM | Freq: Three times a day (TID) | CUTANEOUS | Status: DC
  Filled 2022-01-09: qty 113

## 2022-01-09 NOTE — Progress Notes (Signed)
?Progress Note ? ? ?PatientLamario Burns OEU:[id number] DOB: 1956-08-03 DOA: 12/26/2021     13 ?DOS: the patient was seen and examined on 01/09/2022 ?  ?Brief hospital course: ?66 year old man with COPD and hypertension.  Brought in initially by family for living in poor living conditions and not taking care of himself.  He recently signed out [place] from [facility]. ? ?The patient was found to be in septic shock with ESBL E. coli with acute cystitis and also Candida glabrata fungemia.  Patient was seen by infectious disease and will be on IV Invanz and IV Eraxis through 01/14/2022.  Unfortunately will have to stay the whole course of antibiotics here in the hospital because home health unavailable to be set up. ? ?Patient also has acute kidney injury on superimposed on chronic kidney disease.  The patient had displacement of right ureteral stent that was initially done at [facility].  Dr. Bernardo Heater replaced the stent on 01/02/2022 and will have to follow-up with him for stent removal. ? ?Patient is also had atrial fibrillation with rapid ventricular response and was on IV amiodarone now on oral amiodarone. ? ?The patient did have COVID-19 virus infection diagnosed at [facility] ? ? ? ? ?Assessment and Plan: ?* Septic shock ([place]) ?ESBL E. coli acute cystitis and Candida glabrata fungemia.  Patient will be on IV antibiotics through 3/12.  IV Invanz and IV Eraxis. ? ?Candida glabrata infection ?Continue Eraxis through 01/14/2022. ? ?Acute kidney injury superimposed on CKD ([place]) ?Acute kidney injury likely on chronic kidney disease stage IV.  Patient received 3 doses of torsemide and creatinine went up to 4.75 on 01/07/2022.  The patient was given a fluid bolus and creatinine came down to 2.76.  Creatinine today 2.78.  Recent creatinine at [facility] on 12/22/2021 was 2.8.  Patient does in and out catheterizations. ? ?Anemia of chronic disease ?Patient transfused on a hemoglobin of 7.1 on 01/03/2022.  Hemoglobin 9.0 today. ? ?Atrial  fibrillation with RVR ([place]) ?Continue oral amiodarone.  Cardiology holding off on anticoagulation with the patient's anemia. ? ?Displacement of ureteral stent ([place]) ?Patient had right stent malposition.  Dr. Bernardo Heater did stent repositioning on 01/02/2022. ? ?Chronic osteomyelitis ([place]) ?Patient has had a chronic wound that is been draining on his right ankle for years.  I am not seeing any drainage today.  He stated he was treated with a PICC line and IV antibiotics in the past.  So far wound culture did not show any organisms. ? ?COVID-19 virus infection ?Previous diagnosis at [facility].  Asymptomatic. ? ?Hypomagnesemia ?Continue oral magnesium.  Magnesium normal range today. ? ?Elevated troponin ?Elevated troponin to sepsis. ? ? ?Depression ?Seen by psychiatry and no further recommendations.  Patient declined Zoloft. ? ?Hyponatremia ?Sodium 134 ? ?Lower extremity edema ?Ultrasound negative for DVT.  Holding torsemide at this point with acute kidney injury ? ?Hypotension ?Patient on low-dose midodrine. ? ? ? ? ?  ? ?Subjective: Patient now has some redness bilateral lower extremities and secondary excoriations.  Asking for some cream.  Otherwise patient physically feels okay.  When I went in the room his IV was disconnected.  The patient states he got mostly antibiotic and disconnected his IV.  Case discussed with pharmacist and they were okay with even if he got half the dose of the Invanz. ? ?Physical Exam: ?Vitals:  ? 01/08/22 2100 01/08/22 2357 01/09/22 0432 01/09/22 0753  ?BP: 115/75 107/70 116/73 93/82  ?Pulse: 77 90 85 74  ?Resp: 16 19 16 18   ?  Temp: 97.6 ?F (36.4 ?C) (!) 97.5 ?F (36.4 ?C) 97.7 ?F (36.5 ?C) 98.3 ?F (36.8 ?C)  ?TempSrc:   Oral Oral  ?SpO2: 100% 98% 100% 99%  ?Weight:      ?Height:      ? ?Physical Exam ?HENT:  ?   Head: Normocephalic.  ?   Mouth/Throat:  ?   Pharynx: No oropharyngeal exudate.  ?Eyes:  ?   General: Lids are normal.  ?   Conjunctiva/sclera: Conjunctivae normal.  ?Cardiovascular:  ?    Rate and Rhythm: Normal rate and regular rhythm.  ?   Heart sounds: Normal heart sounds, S1 normal and S2 normal.  ?Pulmonary:  ?   Breath sounds: No decreased breath sounds, wheezing, rhonchi or rales.  ?Abdominal:  ?   Palpations: Abdomen is soft.  ?   Tenderness: There is no abdominal tenderness.  ?Musculoskeletal:  ?   Right lower leg: Swelling present.  ?   Left lower leg: Swelling present.  ?Skin: ?   General: Skin is warm.  ?   Comments: Bilateral lower extremity excoriations and erythema.  Patient has a chronic wound on his right lower extremity that I am not seeing any drainage on today.  ?Neurological:  ?   Mental Status: He is alert and oriented to person, place, and time.  ?  ?Data Reviewed: ? ?Today's laboratory data reviewed.  Creatinine 2.78, sodium 134, white blood cell count 5.7 and hemoglobin 9.0. ? ?Family Communication: Updated patient's brother on the phone today ? ?Disposition: ?Status is: Inpatient ?Remains inpatient appropriate because: We are unable to set up home health and IV antibiotics at home he will have to continue the entire course of IV antibiotics here in the hospital through 01/14/2022. ?  ?Planned Discharge Destination: Home ? ? ?Author: ?Loletha Grayer, MD ?01/09/2022 3:33 PM ? ?For on call review [email or web address].  ?

## 2022-01-10 LAB — RENAL FUNCTION PANEL
Albumin: 2.5 g/dL — ABNORMAL LOW (ref 3.5–5.0)
Anion gap: 7 (ref 5–15)
BUN: 26 mg/dL — ABNORMAL HIGH (ref 8–23)
CO2: 26 mmol/L (ref 22–32)
Calcium: 8.6 mg/dL — ABNORMAL LOW (ref 8.9–10.3)
Chloride: 103 mmol/L (ref 98–111)
Creatinine, Ser: 2.76 mg/dL — ABNORMAL HIGH (ref 0.61–1.24)
GFR, Estimated: 25 mL/min — ABNORMAL LOW (ref >60)
Glucose, Bld: 112 mg/dL — ABNORMAL HIGH (ref 70–99)
Phosphorus: 4.1 mg/dL (ref 2.5–4.6)
Potassium: 3.9 mmol/L (ref 3.5–5.1)
Sodium: 136 mmol/L (ref 135–145)

## 2022-01-10 LAB — CBC WITH DIFFERENTIAL/PLATELET
Abs Immature Granulocytes: 0.03 10*3/uL (ref 0.00–0.07)
Basophils Absolute: 0 10*3/uL (ref 0.0–0.1)
Basophils Relative: 1 %
Eosinophils Absolute: 0.2 10*3/uL (ref 0.0–0.5)
Eosinophils Relative: 3 %
HCT: 28.6 % — ABNORMAL LOW (ref 39.0–52.0)
Hemoglobin: 8.8 g/dL — ABNORMAL LOW (ref 13.0–17.0)
Immature Granulocytes: 1 %
Lymphocytes Relative: 18 %
Lymphs Abs: 1 10*3/uL (ref 0.7–4.0)
MCH: 27.6 pg (ref 26.0–34.0)
MCHC: 30.8 g/dL (ref 30.0–36.0)
MCV: 89.7 fL (ref 80.0–100.0)
Monocytes Absolute: 0.3 10*3/uL (ref 0.1–1.0)
Monocytes Relative: 6 %
Neutro Abs: 3.8 10*3/uL (ref 1.7–7.7)
Neutrophils Relative %: 71 %
Platelets: 151 10*3/uL (ref 150–400)
RBC: 3.19 MIL/uL — ABNORMAL LOW (ref 4.22–5.81)
RDW: 17.1 % — ABNORMAL HIGH (ref 11.5–15.5)
WBC: 5.3 10*3/uL (ref 4.0–10.5)
nRBC: 0 % (ref 0.0–0.2)

## 2022-01-10 LAB — MAGNESIUM: Magnesium: 2 mg/dL (ref 1.7–2.4)

## 2022-01-10 NOTE — Progress Notes (Signed)
Nutrition Follow-up ? ?DOCUMENTATION CODES:  ? ?Obesity unspecified ? ?INTERVENTION:  ? ?-Continue double protein portions with meals ?-Continue MVI with minerals daily ? ?NUTRITION DIAGNOSIS:  ? ?Increased nutrient needs related to acute illness (+COVID-19) as evidenced by estimated needs. ? ?Ongoing ? ?GOAL:  ? ?Patient will meet greater than or equal to 90% of their needs ? ?Progressing  ? ?MONITOR:  ? ?PO intake, Supplement acceptance, Labs, Weight trends, Skin, I & O's ? ?REASON FOR ASSESSMENT:  ? ?Malnutrition Screening Tool ?  ? ?ASSESSMENT:  ? ?Wayne Burns is a 66 y.o. male seen in the emergency room today for urinary tract infection, sepsis.  Patient is unable to give a clear HPI he is tachycardic and review of systems as well is difficult to obtain.  Initially brought by EMS today patient was IVC by family members for not being able to take care of himself and living in poor conditions refusing to go to the hospital and was deemed a danger to himself.  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  Patient on my evaluation is alert oriented but tachycardic perseverates with answering questions and is not able to clearly give me history.  I would even say patient is mildly disoriented.  When asked about his heart history he initially says no then he says he had blockages.  When asked how he knows about the blockages he said he had been tested and I said did you ever have a cardiac cath he said yes.  I am unable to verify this information from [facility] epic system due to lack of access.  Patient appears disheveled unkempt head soiled himself with stool and urine on initial evaluation.  The emergency room patient meets sepsis criteria. ? ?2/27- s/p TEE- no evidence of vegetation ? ?Reviewed I/O's: -8.3 L x 24 hours and -20.5 L since 12/27/21 ? ?UOP: 8.6 L x 24 hours ? ?Pt unavailable at time of visit. Attempted to speak with pt via call to  hospital room phone, however, unable to reach.  ? ?Pt with ESBL E. coli acute cystitis and Candida glabrata fungemia. Plan toi be hospitalized due to IV antibiotics through 01/14/22.  ? ?Pt remains with good appetite. Noted meal completions 50-100%.  ? ?Labs reviewed: CBGS: 90-123 (inpatient orders for glycemic control are none).   ? ?Diet Order:   ?Diet Order   ? ?       ?  Diet Carb Modified Fluid consistency: Thin; Room service appropriate? Yes  Diet effective now       ?  ? ?  ?  ? ?  ? ? ?EDUCATION NEEDS:  ? ?No education needs have been identified at this time ? ?Skin:  Skin Assessment: Skin Integrity Issues: ?Skin Integrity Issues:: Incisions ?Incisions: closed penis ?Other: wound to rt foot ? ?Last BM:  01/09/22 ? ?Height:  ? ?Ht Readings from Last 1 Encounters:  ?01/02/22 5\' 9"  (1.753 m)  ? ? ?Weight:  ? ?Wt Readings from Last 1 Encounters:  ?01/02/22 91.4 kg  ? ? ?Ideal Body Weight:  72.7 kg ? ?BMI:  Body mass index is 29.76 kg/m?. ? ?Estimated Nutritional Needs:  ? ?Kcal:  2200-2400 ? ?Protein:  110-125 grams ? ?Fluid:  > 2 L ? ? ? ?Loistine Chance, RD, LDN, CDCES ?Registered Dietitian II ?Certified Diabetes Care and Education Specialist ?Please refer to [facility] for RD and/or RD on-call/weekend/after hours pager  ?

## 2022-01-10 NOTE — Progress Notes (Signed)
?Progress Note ? ? ?PatientJourdon Burns VQQ:[id number] DOB: Mar 04, 1956 DOA: 12/26/2021     14 ?DOS: the patient was seen and examined on 01/10/2022 ?  ?Brief hospital course: ?66 year old man with COPD and hypertension.  Brought in initially by family for living in poor living conditions and not taking care of himself.  He recently signed out [place] from [facility]. ? ?The patient was found to be in septic shock with ESBL E. coli with acute cystitis and also Candida glabrata fungemia.  Patient was seen by infectious disease and will be on IV Invanz and IV Eraxis through 01/14/2022.  Unfortunately will have to stay the whole course of antibiotics here in the hospital because home health unavailable to be set up. ? ?Patient also has acute kidney injury on superimposed on chronic kidney disease.  The patient had displacement of right ureteral stent that was initially done at [facility].  Dr. Bernardo Heater replaced the stent on 01/02/2022 and will have to follow-up with him for stent removal. ? ?Patient is also had atrial fibrillation with rapid ventricular response and was on IV amiodarone now on oral amiodarone. ? ?The patient did have COVID-19 virus infection diagnosed at [facility]. ? ?Assessment and Plan: ?* Septic shock ([place]) ?ESBL E. coli acute cystitis and Candida glabrata fungemia.  Patient will be on IV antibiotics through 3/12.  IV Invanz and IV Eraxis. ? ?Candida glabrata infection ?Continue Eraxis through 01/14/2022. ? ?Acute kidney injury superimposed on CKD ([place]) ?Acute kidney injury likely on chronic kidney disease stage IV.  Patient received 3 doses of torsemide and creatinine went up to 4.75 on 01/07/2022.  The patient was given a fluid bolus and creatinine came down to 2.76.  Creatinine today 2.78.  Recent creatinine at [facility] on 12/22/2021 was 2.8.  Patient does in and out catheterizations. ? ?Anemia of chronic disease ?Patient transfused on a hemoglobin of 7.1 on 01/03/2022.  Hemoglobin 8.8 today, stable. ? ?Atrial  fibrillation with RVR ([place]) ?Continue oral amiodarone.  Cardiology holding off on anticoagulation with the patient's anemia. ? ?Displacement of ureteral stent ([place]) ?Patient had right stent malposition.  Dr. Bernardo Heater did stent repositioning on 01/02/2022. ? ?Chronic osteomyelitis ([place]) ?Patient has had a chronic wound that is been draining on his right ankle for years.  I am not seeing any drainage today.  He stated he was treated with a PICC line and IV antibiotics in the past.  So far wound culture did not show any organisms. ? ?COVID-19 virus infection ?Previous diagnosis at [facility]  Asymptomatic. ? ?Hypomagnesemia ?Continue oral magnesium.  Magnesium normal range today. ? ?Elevated troponin ?Elevated troponin to sepsis. ? ? ?Depression ?Seen by psychiatry and no further recommendations.  Patient declined Zoloft. ? ?Hyponatremia ?Resolved. Sodium 136 (3/8). ? ?Lower extremity edema ?Ultrasound negative for DVT.  Holding torsemide at this point with acute kidney injury ? ?Hypotension ?MAP's stable. ?On low-dose midodrine. ? ? ? ? ?  ? ?Subjective: Patient awake sitting up in bed when seen today.  He reports overall feeling well.  He continues to have significant swelling over the top of his right foot which he asked me to remove the Ace wrap to look at more closely.  He does not think he can fit his foot into any shoe.  No fevers chills or other acute complaints. ? ?Physical Exam: ?Vitals:  ? 01/09/22 1719 01/09/22 2050 01/10/22 0534 01/10/22 0849  ?BP: 113/68 119/75 102/63 117/75  ?Pulse: 78 94 91 79  ?Resp: 19 17 18 16   ?  Temp: 98 ?F (36.7 ?C) (!) 97.5 ?F (36.4 ?C) 98 ?F (36.7 ?C) 98.1 ?F (36.7 ?C)  ?TempSrc: Oral Oral Oral   ?SpO2: 100% 100% 97% 100%  ?Weight:      ?Height:      ? ?General exam: awake, alert, no acute distress ?HEENT: atraumatic, clear conjunctiva, anicteric sclera, moist mucus membranes, hearing grossly normal  ?Respiratory system: CTAB, no wheezes, rales or rhonchi, normal respiratory  effort. ?Cardiovascular system: normal S1/S2, RRR, brisk cap refill.   ?Gastrointestinal system: soft, NT, ND, no HSM felt, +bowel sounds. ?Central nervous system: A&O x3. no gross focal neurologic deficits, normal speech ?Extremities: Bilateral lower extremity edema with Ace wraps on bilateral distal lower extremities, dorsal aspect of the right foot is significantly edematous and erythematous but without differential warmth, normal tone ?Skin: dry, intact, normal temperature, bilateral lower extremity venous stasis changes and erythematous right foot as above ?Psychiatry: normal mood, congruent affect, judgement and insight appear normal ? ? ? ? ?Data Reviewed: ? ?Labs reviewed and notable for glucose 112, BUN 26 improving, creatinine stable 2.76, calcium 8.6, albumin 2.5, hemoglobin 8.8 ? ?Family Communication: None ? ?Disposition: ?Status is: Inpatient ?Remains inpatient appropriate because: Severity of illness requiring IV antibiotic and IV antifungal through 3/12.  Home health nursing was not able to be arranged to provide these therapies outside the hospital. ? ? Planned Discharge Destination: Home ? ? ? ?Time spent: 35 minutes ? ?Author: ?Ezekiel Slocumb, DO ?01/10/2022 2:47 PM ? ?For on call review [email or web address]  ?

## 2022-01-11 DIAGNOSIS — N200 Calculus of kidney: Principal | ICD-10-CM

## 2022-01-11 LAB — RENAL FUNCTION PANEL
Albumin: 2.4 g/dL — ABNORMAL LOW (ref 3.5–5.0)
Anion gap: 10 (ref 5–15)
BUN: 24 mg/dL — ABNORMAL HIGH (ref 8–23)
CO2: 25 mmol/L (ref 22–32)
Calcium: 8.9 mg/dL (ref 8.9–10.3)
Chloride: 104 mmol/L (ref 98–111)
Creatinine, Ser: 2.51 mg/dL — ABNORMAL HIGH (ref 0.61–1.24)
GFR, Estimated: 28 mL/min — ABNORMAL LOW (ref >60)
Glucose, Bld: 98 mg/dL (ref 70–99)
Phosphorus: 4 mg/dL (ref 2.5–4.6)
Potassium: 4 mmol/L (ref 3.5–5.1)
Sodium: 139 mmol/L (ref 135–145)

## 2022-01-11 LAB — CBC WITH DIFFERENTIAL/PLATELET
Abs Immature Granulocytes: 0.04 10*3/uL (ref 0.00–0.07)
Basophils Absolute: 0 10*3/uL (ref 0.0–0.1)
Basophils Relative: 1 %
Eosinophils Absolute: 0.2 10*3/uL (ref 0.0–0.5)
Eosinophils Relative: 4 %
HCT: 28.1 % — ABNORMAL LOW (ref 39.0–52.0)
Hemoglobin: 8.7 g/dL — ABNORMAL LOW (ref 13.0–17.0)
Immature Granulocytes: 1 %
Lymphocytes Relative: 16 %
Lymphs Abs: 0.8 10*3/uL (ref 0.7–4.0)
MCH: 28.2 pg (ref 26.0–34.0)
MCHC: 31 g/dL (ref 30.0–36.0)
MCV: 91.2 fL (ref 80.0–100.0)
Monocytes Absolute: 0.3 10*3/uL (ref 0.1–1.0)
Monocytes Relative: 6 %
Neutro Abs: 3.7 10*3/uL (ref 1.7–7.7)
Neutrophils Relative %: 72 %
Platelets: 150 10*3/uL (ref 150–400)
RBC: 3.08 MIL/uL — ABNORMAL LOW (ref 4.22–5.81)
RDW: 17 % — ABNORMAL HIGH (ref 11.5–15.5)
WBC: 5 10*3/uL (ref 4.0–10.5)
nRBC: 0 % (ref 0.0–0.2)

## 2022-01-11 LAB — MAGNESIUM: Magnesium: 1.7 mg/dL (ref 1.7–2.4)

## 2022-01-11 LAB — AEROBIC/ANAEROBIC CULTURE W GRAM STAIN (SURGICAL/DEEP WOUND)
Culture: NO GROWTH
Special Requests: NORMAL

## 2022-01-11 MED ORDER — ENOXAPARIN SODIUM 40 MG/0.4ML IJ SOSY
40.0000 mg | PREFILLED_SYRINGE | INTRAMUSCULAR | Status: DC
  Administered 2022-01-11 – 2022-01-13 (×3): 40 mg via SUBCUTANEOUS
  Filled 2022-01-11 (×3): qty 0.4

## 2022-01-11 NOTE — Progress Notes (Signed)
?Progress Note ? ? ?PatientMizraim Burns OBS:[id number] DOB: 1956-04-24 DOA: 12/26/2021     15 ?DOS: the patient was seen and examined on 01/11/2022 ?  ?Brief hospital course: ?66 year old man with COPD and hypertension.  Brought in initially by family for living in poor living conditions and not taking care of himself.  He recently signed out [place] from [facility] ? ?The patient was found to be in septic shock with ESBL E. coli with acute cystitis and also Candida glabrata fungemia.  Patient was seen by infectious disease and will be on IV Invanz and IV Eraxis through 01/14/2022.  Unfortunately will have to stay the whole course of antibiotics here in the hospital because home health unavailable to be set up. ? ?Patient also has acute kidney injury on superimposed on chronic kidney disease.  The patient had displacement of right ureteral stent that was initially done at [facility].  Dr. Bernardo Heater replaced the stent on 01/02/2022 and will have to follow-up with him for stent removal. ? ?Patient is also had atrial fibrillation with rapid ventricular response and was on IV amiodarone now on oral amiodarone. ? ?The patient did have COVID-19 virus infection diagnosed at [facility]. ? ?Assessment and Plan: ?* Septic shock ([place]) ?ESBL E. coli acute cystitis and Candida glabrata fungemia.  Patient will be on IV antibiotics through 3/12.  IV Invanz and IV Eraxis. ? ?Candida glabrata infection ?Continue Eraxis through 01/14/2022. ? ?Acute kidney injury superimposed on CKD ([place]) ?Acute kidney injury likely on chronic kidney disease stage IV.  Patient received 3 doses of torsemide and creatinine went up to 4.75 on 01/07/2022.  The patient was given a fluid bolus and creatinine came down to 2.76.  Creatinine today 2.78.  Recent creatinine at [facility] on 12/22/2021 was 2.8.  Patient does in and out catheterizations. ? ?Anemia of chronic disease ?Patient transfused on a hemoglobin of 7.1 on 01/03/2022.  Hemoglobin 8.8 today, stable. ? ?Atrial  fibrillation with RVR ([place]) ?Continue oral amiodarone.  Cardiology holding off on anticoagulation with the patient's anemia. ? 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  Dr. Bernardo Heater did stent repositioning on 01/02/2022. ? ?Chronic osteomyelitis ([place]) ?Patient has had a chronic wound that is been draining on his right ankle for years.  I am not seeing any drainage today.  He stated he was treated with a PICC line and IV antibiotics in the past.  So far wound culture did not show any organisms. ? ?COVID-19 virus infection ?Previous diagnosis at [facility].  Asymptomatic. ? ?Hypomagnesemia ?Continue oral magnesium.  Magnesium normal range today. ? ?Elevated troponin ?Elevated troponin to sepsis. ? ? ?Depression ?Seen by psychiatry and no further recommendations.  Patient declined Zoloft. ? ?Hyponatremia ?Resolved. Sodium 136 (3/8). ? ?Lower extremity edema ?Ultrasound negative for DVT.  Holding torsemide at this point with acute kidney injury ? ?Hypotension ?MAP's stable. ?On low-dose midodrine. ? ? ? ? ?  ? ?Subjective: Patient awake sitting up in recliner when seen today.  He reports his right foot pain and swelling is a little better today.  He is able to walk around and trying to do so more.  No fevers or chills or other acute complaints.  He looks forward to going home hopefully Sunday. ? ?Physical Exam: ?Vitals:  ? 01/10/22 0849 01/10/22 1543 01/11/22 4765 01/11/22 0824  ?BP: 117/75 105/63 106/62 108/74  ?Pulse: 79 84 86 85  ?Resp: 16 16 18 20   ?Temp: 98.1 ?F (36.7 ?C) 98.2 ?F (36.8 ?C)  97.8 ?F (36.6 ?C) 98.6 ?F (37 ?C)  ?TempSrc:      ?SpO2: 100% 100% 100% 95%  ?Weight:      ?Height:      ? ?General exam: awake, alert, no acute distress, obese ?HEENT: moist mucus membranes, hearing grossly normal  ?Respiratory system: normal respiratory effort, on room air, symmetric chest rise. ?Cardiovascular system: RRR, stable bilateral lower extremity edema with Ace wraps in place.Marland Kitchen    ?Gastrointestinal system: Abdomen distended but nontender. ?Central nervous system: A&O x3. no gross focal neurologic deficits, normal speech ?Extremities: Right foot pedal edema appears mildly improved and erythema stable, no differential warmth, Ace bandages on bilateral lower extremities, normal tone ?Psychiatry: normal mood, congruent affect, judgement and insight appear normal ? ? ? ?Data Reviewed: ? ?Labs reviewed and notable for BUN 24, creatinine improved to 2.51 from 2.76, albumin 2.4, hemoglobin 8.8 stable ? ?Family Communication: None ? ?Disposition: ?Status is: Inpatient ?Remains inpatient appropriate because: Severity of illness requiring IV antibiotic and IV antifungal through 3/12.  Anticipate discharge home 3/12 after final doses and therapy complete. ? ? Planned Discharge Destination: Home ? ? ? ?Time spent: 35 minutes ? ?Author: ?Ezekiel Slocumb, DO ?01/11/2022 1:12 PM ? ?For on call review [email or web address].  ?

## 2022-01-11 NOTE — Progress Notes (Signed)
PHARMACIST - PHYSICIAN COMMUNICATION ? ?CONCERNING:  Enoxaparin (Lovenox) for DVT Prophylaxis  ? ? ?RECOMMENDATION: ?Patient was prescribed enoxaprin 30mg  q24 hours for VTE prophylaxis.  ? ?Filed Weights  ? 12/28/21 1500 12/31/21 1741 01/02/22 1118  ?Weight: 92.6 kg (204 lb 2.3 oz) 91.4 kg (201 lb 9.6 oz) 91.4 kg (201 lb 8 oz)  ? ? ?Body mass index is 29.76 kg/m?. ? ?Estimated Creatinine Clearance: 33.7 mL/min (A) (by C-G formula based on SCr of 2.51 mg/dL (H)). ? ? ?Patient is candidate for enoxaparin 40mg  every 24 hours based on CrCl >33ml/min ? ? ?DESCRIPTION: ?Pharmacy has adjusted enoxaparin dose per [facility] policy. ? ?Patient is now receiving enoxaparin 40 mg every 24 hours  ? ? ?Darrick Penna, PharmD ?Clinical Pharmacist  ?01/11/2022 ?8:38 AM ? ?

## 2022-01-11 NOTE — Progress Notes (Signed)
[facility]  ROUNDING NOTE   Subjective:   Patient sitting up in chair Alert and oriented Lower extremity edema remains.   03/08 0701 - 03/09 0700 In: 843.5 [P.O.:480; IV Piggyback:363.5] Out: 3650 [Urine:3650] Lab Results  Component Value Date   CREATININE 2.51 (H) 01/11/2022   CREATININE 2.76 (H) 01/10/2022   CREATININE 2.78 (H) 01/09/2022    Creatinine stable Urine output adequate 3.6 L.  Objective:  Vital signs in last 24 hours:  Temp:  [97.8 F (36.6 C)-98.6 F (37 C)] 98.6 F (37 C) (03/09 0824) Pulse Rate:  [84-86] 85 (03/09 0824) Resp:  [16-20] 20 (03/09 0824) BP: (105-108)/(62-74) 108/74 (03/09 0824) SpO2:  [95 %-100 %] 95 % (03/09 0824)  Weight change:  Filed Weights   12/28/21 1500 12/31/21 1741 01/02/22 1118  Weight: 92.6 kg 91.4 kg 91.4 kg    Intake/Output: I/O last 3 completed shifts: In: 843.5 [P.O.:480; IV Piggyback:363.5] Out: 10236 [TMHDQ:22297]   Intake/Output this shift:  No intake/output data recorded.  Physical Exam: General: NAD  Head: Normocephalic, atraumatic. Moist oral mucosal membranes  Eyes: Anicteric  Lungs:  Normal effort, clear to auscultation  Heart: Regular rate and rhythm  Abdomen:  Soft, nontender, obese  Extremities: 2+ peripheral edema.  Neurologic: Nonfocal, moving all four extremities  Skin: No acute skin rash       Basic Metabolic Panel: Recent Labs  Lab 01/07/22 0442 01/08/22 0509 01/09/22 0644 01/10/22 0620 01/11/22 0452  NA 140 134* 134* 136 139  K 4.0 4.0 4.0 3.9 4.0  CL 112* 100 98 103 104  CO2 16* 25 24 26 25   GLUCOSE 111* 107* 95 112* 98  BUN 117* 29* 27* 26* 24*  CREATININE 4.75* 2.76* 2.78* 2.76* 2.51*  CALCIUM 8.4* 8.5* 8.9 8.6* 8.9  MG 1.5* 2.0 1.8 2.0 1.7  PHOS 3.2 4.1 4.1 4.1 4.0     Liver Function Tests: Recent Labs  Lab 01/07/22 0442 01/08/22 0509 01/09/22 0644 01/10/22 0620 01/11/22 0452  ALBUMIN 2.0* 2.5* 2.7* 2.5* 2.4*    No results for input(s): LIPASE,  AMYLASE in the last 168 hours. No results for input(s): AMMONIA in the last 168 hours.  CBC: Recent Labs  Lab 01/07/22 0442 01/08/22 0509 01/09/22 0644 01/10/22 0620 01/11/22 0452  WBC 6.5 5.5 5.7 5.3 5.0  NEUTROABS 4.5 3.8 4.0 3.8 3.7  HGB 9.1* 9.0* 9.0* 8.8* 8.7*  HCT 28.7* 28.4* 28.8* 28.6* 28.1*  MCV 87.8 87.9 88.9 89.7 91.2  PLT 155 162 161 151 150     Cardiac Enzymes: No results for input(s): CKTOTAL, CKMB, CKMBINDEX, TROPONINI in the last 168 hours.   BNP: Invalid input(s): POCBNP  CBG: Recent Labs  Lab 01/07/22 1139 01/07/22 1640 01/07/22 2135 01/08/22 0829 01/08/22 2059  GLUCAP 138* 122* 123* 90 118*     Microbiology: Results for orders placed or performed during the hospital encounter of 12/26/21  Resp Panel by RT-PCR (Flu A&B, Covid)     Status: Abnormal   Collection Time: 12/26/21 10:33 PM   Specimen: Nasopharyngeal(NP) swabs in vial transport medium  Result Value Ref Range Status   SARS Coronavirus 2 by RT PCR POSITIVE (A) NEGATIVE Final    Comment: (NOTE) SARS-CoV-2 target nucleic acids are DETECTED.  The SARS-CoV-2 RNA is generally detectable in upper respiratory specimens during the acute phase of infection. Positive results are indicative of the presence of the identified virus, but do not rule out bacterial infection or co-infection with other pathogens not detected by the test.  Clinical correlation with patient history and other diagnostic information is necessary to determine patient infection status. The expected result is Negative.  Fact Sheet for Patients: EntrepreneurPulse.com.au  Fact Sheet for Healthcare Providers: IncredibleEmployment.be  This test is not yet approved or cleared by the [place] FDA and  has been authorized for detection and/or diagnosis of SARS-CoV-2 by FDA under an Emergency Use Authorization (EUA).  This EUA will remain in effect (meaning this test can be used) for  the duration of  the COVID-19 declaration under Section 564(b)(1) of the A ct, 21 U.S.C. section 360bbb-3(b)(1), unless the authorization is terminated or revoked sooner.     Influenza A by PCR NEGATIVE NEGATIVE Final   Influenza B by PCR NEGATIVE NEGATIVE Final    Comment: (NOTE) The Xpert Xpress SARS-CoV-2/FLU/RSV plus assay is intended as an aid in the diagnosis of influenza from Nasopharyngeal swab specimens and should not be used as a sole basis for treatment. Nasal washings and aspirates are unacceptable for Xpert Xpress SARS-CoV-2/FLU/RSV testing.  Fact Sheet for Patients: EntrepreneurPulse.com.au  Fact Sheet for Healthcare Providers: IncredibleEmployment.be  This test is not yet approved or cleared by the [place] FDA and has been authorized for detection and/or diagnosis of SARS-CoV-2 by FDA under an Emergency Use Authorization (EUA). This EUA will remain in effect (meaning this test can be used) for the duration of the COVID-19 declaration under Section 564(b)(1) of the Act, 21 U.S.C. section 360bbb-3(b)(1), unless the authorization is terminated or revoked.  Performed at [facility], [address] [place], [place] [address]   Blood culture (routine x 2)     Status: None   Collection Time: 12/26/21 10:33 PM   Specimen: BLOOD  Result Value Ref Range Status   Specimen Description BLOOD LEFT ASSIST CONTROL  Final   Special Requests   Final    BOTTLES DRAWN AEROBIC AND ANAEROBIC Blood Culture adequate volume   Culture   Final    NO GROWTH 5 DAYS Performed at [facility], [address], [place], [place] [address]    Report Status 12/31/2021 FINAL  Final  Blood culture (routine x 2)     Status: Abnormal   Collection Time: 12/26/21 10:46 PM   Specimen: BLOOD  Result Value Ref Range Status   Specimen Description   Final    BLOOD RIGHT ANTECUBITAL Performed at [facility], [place] [address],  [place], [place] [address]    Special Requests   Final    BOTTLES DRAWN AEROBIC AND ANAEROBIC Blood Culture adequate volume Performed at [facility], [address] [place], [place] [address]    Culture  Setup Time   Final    GRAM NEGATIVE RODS ANAEROBIC BOTTLE ONLY Organism ID to follow CRITICAL RESULT CALLED TO, READ BACK BY AND VERIFIED WITH: [facility] MITCHELL [id number] 12/27/21 HNM    Culture (A)  Final    ESCHERICHIA COLI Confirmed Extended Spectrum Beta-Lactamase Producer (ESBL).  In bloodstream infections from ESBL organisms, carbapenems are preferred over piperacillin/tazobactam. They are shown to have a lower risk of mortality.    Report Status 12/29/2021 FINAL  Final   Organism ID, Bacteria ESCHERICHIA COLI  Final      Susceptibility   Escherichia coli - MIC*    AMPICILLIN >=32 RESISTANT Resistant     CEFAZOLIN >=64 RESISTANT Resistant     CEFEPIME 16 RESISTANT Resistant     CEFTAZIDIME RESISTANT Resistant     CEFTRIAXONE >=64 RESISTANT Resistant  CIPROFLOXACIN <=0.25 SENSITIVE Sensitive     GENTAMICIN <=1 SENSITIVE Sensitive     IMIPENEM <=0.25 SENSITIVE Sensitive     TRIMETH/SULFA <=20 SENSITIVE Sensitive     AMPICILLIN/SULBACTAM 8 SENSITIVE Sensitive     PIP/TAZO <=4 SENSITIVE Sensitive     * ESCHERICHIA COLI  Blood Culture ID Panel (Reflexed)     Status: Abnormal   Collection Time: 12/26/21 10:46 PM  Result Value Ref Range Status   Enterococcus faecalis NOT DETECTED NOT DETECTED Final   Enterococcus Faecium NOT DETECTED NOT DETECTED Final   Listeria monocytogenes NOT DETECTED NOT DETECTED Final   Staphylococcus species NOT DETECTED NOT DETECTED Final   Staphylococcus aureus (BCID) NOT DETECTED NOT DETECTED Final   Staphylococcus epidermidis NOT DETECTED NOT DETECTED Final   Staphylococcus lugdunensis NOT DETECTED NOT DETECTED Final   Streptococcus species NOT DETECTED NOT DETECTED Final   Streptococcus agalactiae NOT DETECTED NOT DETECTED Final    Streptococcus pneumoniae NOT DETECTED NOT DETECTED Final   Streptococcus pyogenes NOT DETECTED NOT DETECTED Final   A.calcoaceticus-baumannii NOT DETECTED NOT DETECTED Final   Bacteroides fragilis NOT DETECTED NOT DETECTED Final   Enterobacterales DETECTED (A) NOT DETECTED Final    Comment: Enterobacterales represent a large order of gram negative bacteria, not a single organism. CRITICAL RESULT CALLED TO, READ BACK BY AND VERIFIED WITH: DEVAN MITCHELL PHARMD 1126 12/27/21 HNM    Enterobacter cloacae complex NOT DETECTED NOT DETECTED Final   Escherichia coli DETECTED (A) NOT DETECTED Final    Comment: CRITICAL RESULT CALLED TO, READ BACK BY AND VERIFIED WITH: DEVAN MITCHELL [id number] 12/27/21 HNM    Klebsiella aerogenes NOT DETECTED NOT DETECTED Final   Klebsiella oxytoca NOT DETECTED NOT DETECTED Final   Klebsiella pneumoniae NOT DETECTED NOT DETECTED Final   Proteus species NOT DETECTED NOT DETECTED Final   Salmonella species NOT DETECTED NOT DETECTED Final   Serratia marcescens NOT DETECTED NOT DETECTED Final   Haemophilus influenzae NOT DETECTED NOT DETECTED Final   Neisseria meningitidis NOT DETECTED NOT DETECTED Final   Pseudomonas aeruginosa NOT DETECTED NOT DETECTED Final   Stenotrophomonas maltophilia NOT DETECTED NOT DETECTED Final   Candida albicans NOT DETECTED NOT DETECTED Final   Candida auris NOT DETECTED NOT DETECTED Final   Candida glabrata NOT DETECTED NOT DETECTED Final   Candida krusei NOT DETECTED NOT DETECTED Final   Candida parapsilosis NOT DETECTED NOT DETECTED Final   Candida tropicalis NOT DETECTED NOT DETECTED Final   Cryptococcus neoformans/gattii NOT DETECTED NOT DETECTED Final   CTX-M ESBL DETECTED (A) NOT DETECTED Final    Comment: CRITICAL RESULT CALLED TO, READ BACK BY AND VERIFIED WITH: DEVAN MITCHELL PHARMD 1126 12/27/21 HNM (NOTE) Extended spectrum beta-lactamase detected. Recommend a carbapenem as initial therapy.      Carbapenem resistance  IMP NOT DETECTED NOT DETECTED Final   Carbapenem resistance KPC NOT DETECTED NOT DETECTED Final   Carbapenem resistance NDM NOT DETECTED NOT DETECTED Final   Carbapenem resist OXA 48 LIKE NOT DETECTED NOT DETECTED Final   Carbapenem resistance VIM NOT DETECTED NOT DETECTED Final    Comment: Performed at [facility], [address], [place], [place] [address]  Urine Culture     Status: Abnormal   Collection Time: 12/27/21  9:59 AM   Specimen: Urine, Clean Catch  Result Value Ref Range Status   Specimen Description   Final    URINE, CLEAN CATCH Performed at [facility], [address], [place], [place] [address]    Special Requests  Final    NONE Performed at [facility], [address], [place], [place] [address]    Culture (A)  Final    <10,000 COLONIES/mL INSIGNIFICANT GROWTH Performed at [place] [address] [place], [place] [address]    Report Status 12/28/2021 FINAL  Final  Antifungal AST 9 Drug Panel     Status: None   Collection Time: 12/28/21  6:21 AM  Result Value Ref Range Status   Organism ID, Yeast Candida glabrata  Corrected    Comment: (NOTE) Identification performed by account, not confirmed by this laboratory. CORRECTED ON 03/02 AT 1136: PREVIOUSLY REPORTED AS Preliminary report    Amphotericin B MIC 1.0 ug/mL  Final    Comment: (NOTE) Breakpoints have been established for only some organism-drug combinations as indicated. This test was developed and its performance characteristics determined by [facility]. It has not been cleared or approved by the Food and Drug Administration.    Anidulafungin MIC Comment  Final    Comment: (NOTE) 0.06 ug/mL Susceptible Breakpoints have been established for only some organism-drug combinations as indicated. This test was developed and its performance characteristics determined by [facility]. It has not been cleared or approved by the Food and Drug Administration.     Caspofungin MIC Comment  Final    Comment: (NOTE) 0.12 ug/mL Susceptible Breakpoints have been established for only some organism-drug combinations as indicated. This test was developed and its performance characteristics determined by [facility]. It has not been cleared or approved by the Food and Drug Administration.    Micafungin MIC Comment  Final    Comment: (NOTE) 0.016 ug/mL Susceptible Breakpoints have been established for only some organism-drug combinations as indicated. This test was developed and its performance characteristics determined by [facility]. It has not been cleared or approved by the Food and Drug Administration.    Posaconazole MIC 1.0 ug/mL  Final    Comment: (NOTE) Breakpoints have been established for only some organism-drug combinations as indicated. This test was developed and its performance characteristics determined by [facility]. It has not been cleared or approved by the Food and Drug Administration.    Fluconazole Islt MIC 16.0 ug/mL  Final    Comment: (NOTE) Susceptible Dose Dependent Breakpoints have been established for only some organism-drug combinations as indicated. This test was developed and its performance characteristics determined by [facility]. It has not been cleared or approved by the Food and Drug Administration.    Flucytosine MIC 0.06 ug/mL  Final    Comment: (NOTE) Breakpoints have been established for only some organism-drug combinations as indicated. This test was developed and its performance characteristics determined by [facility]. It has not been cleared or approved by the Food and Drug Administration.    Itraconazole MIC 1.0 ug/mL  Final    Comment: (NOTE) Breakpoints have been established for only some organism-drug combinations as indicated. This test was developed and its performance characteristics determined by [facility]. It has not been cleared or approved by the Food and Drug Administration.    Voriconazole MIC  0.25 ug/mL  Final    Comment: (NOTE) Breakpoints have been established for only some organism-drug combinations as indicated. This test was developed and its performance characteristics determined by [facility]. It has not been cleared or approved by the Food and Drug Administration. Performed At: [facility] [address] [place], [place] [id number] Rush Farmer MD EX:[id number]    Source CANDIDA GLABRATA FROM BLOOD  Final    Comment: Performed at [facility], [address], [place], [place] [address]  MRSA Next Gen by PCR, Nasal     Status: Abnormal   Collection Time: 12/28/21  3:10 PM   Specimen: Nasal Mucosa; Nasal Swab  Result Value Ref Range Status   MRSA by PCR Next Gen DETECTED (A) NOT DETECTED Final    Comment: RESULT CALLED TO, READ BACK BY AND VERIFIED WITH: GINA SHANNON 1701 12/28/21 MU (NOTE) The GeneXpert MRSA Assay (FDA approved for NASAL specimens only), is one component of a comprehensive MRSA colonization surveillance program. It is not intended to diagnose MRSA infection nor to guide or monitor treatment for MRSA infections. Test performance is not FDA approved in patients less than 84 years old. Performed at [facility], [address] [place], [place] [address]   CULTURE, BLOOD (ROUTINE X 2) w Reflex to ID Panel     Status: None   Collection Time: 12/29/21  6:21 AM   Specimen: BLOOD  Result Value Ref Range Status   Specimen Description BLOOD RIGHT [facility]  Final   Special Requests   Final    BOTTLES DRAWN AEROBIC AND ANAEROBIC Blood Culture adequate volume   Culture   Final    NO GROWTH 5 DAYS Performed at [facility], [address] [place], [place] [address]    Report Status 01/03/2022 FINAL  Final  CULTURE, BLOOD (ROUTINE X 2) w Reflex to ID Panel     Status: Abnormal   Collection Time: 12/29/21  6:21 AM   Specimen: BLOOD  Result Value Ref Range Status   Specimen Description   Final    BLOOD RIGHT HAND Performed  at [facility], [address] [place], [place] [address]    Special Requests   Final    IN PEDIATRIC BOTTLE Blood Culture adequate volume Performed at [facility], [address] [place], [place] [address]    Culture  Setup Time   Final    YEAST IN PEDIATRIC BOTTLE Organism ID to follow CRITICAL RESULT CALLED TO, READ BACK BY AND VERIFIED WITH: BRANDON BEERS @1310  12/30/21 SCS    Culture (A)  Final    CANDIDA GLABRATA SEE SEPARATE REPORT Performed at [facility], [place] [address], [place], [place] [address]    Report Status 01/04/2022 FINAL  Final  Blood Culture ID Panel (Reflexed)     Status: Abnormal   Collection Time: 12/29/21  6:21 AM  Result Value Ref Range Status   Enterococcus faecalis NOT DETECTED NOT DETECTED Final   Enterococcus Faecium NOT DETECTED NOT DETECTED Final   Listeria monocytogenes NOT DETECTED NOT DETECTED Final   Staphylococcus species NOT DETECTED NOT DETECTED Final   Staphylococcus aureus (BCID) NOT DETECTED NOT DETECTED Final   Staphylococcus epidermidis NOT DETECTED NOT DETECTED Final   Staphylococcus lugdunensis NOT DETECTED NOT DETECTED Final   Streptococcus species NOT DETECTED NOT DETECTED Final   Streptococcus agalactiae NOT DETECTED NOT DETECTED Final   Streptococcus pneumoniae NOT DETECTED NOT DETECTED Final   Streptococcus pyogenes NOT DETECTED NOT DETECTED Final   A.calcoaceticus-baumannii NOT DETECTED NOT DETECTED Final   Bacteroides fragilis NOT DETECTED NOT DETECTED Final   Enterobacterales NOT DETECTED NOT DETECTED Final   Enterobacter cloacae complex NOT DETECTED NOT DETECTED Final   Escherichia coli NOT DETECTED NOT DETECTED Final   Klebsiella aerogenes NOT DETECTED NOT DETECTED Final   Klebsiella oxytoca NOT DETECTED NOT DETECTED Final   Klebsiella pneumoniae NOT DETECTED NOT DETECTED Final   Proteus species NOT DETECTED  NOT DETECTED Final   Salmonella species NOT DETECTED NOT DETECTED Final   Serratia  marcescens NOT DETECTED NOT DETECTED Final   Haemophilus influenzae NOT DETECTED NOT DETECTED Final   Neisseria meningitidis NOT DETECTED NOT DETECTED Final   Pseudomonas aeruginosa NOT DETECTED NOT DETECTED Final   Stenotrophomonas maltophilia NOT DETECTED NOT DETECTED Final   Candida albicans NOT DETECTED NOT DETECTED Final   Candida auris NOT DETECTED NOT DETECTED Final   Candida glabrata DETECTED (A) NOT DETECTED Final    Comment: CRITICAL RESULT CALLED TO, READ BACK BY AND VERIFIED WITH: BRANDON BEERS @1310  12/30/21 SCS    Candida krusei NOT DETECTED NOT DETECTED Final   Candida parapsilosis NOT DETECTED NOT DETECTED Final   Candida tropicalis NOT DETECTED NOT DETECTED Final   Cryptococcus neoformans/gattii NOT DETECTED NOT DETECTED Final    Comment: Performed at [facility], [address] [place], [place] [address]  CULTURE, BLOOD (ROUTINE X 2) w Reflex to ID Panel     Status: None   Collection Time: 12/31/21  3:08 AM   Specimen: BLOOD  Result Value Ref Range Status   Specimen Description BLOOD BLOOD RIGHT HAND  Final   Special Requests   Final    BOTTLES DRAWN AEROBIC AND ANAEROBIC Blood Culture adequate volume   Culture   Final    NO GROWTH 5 DAYS Performed at [facility], [address] [place], [place] [address]    Report Status 01/05/2022 FINAL  Final  CULTURE, BLOOD (ROUTINE X 2) w Reflex to ID Panel     Status: None   Collection Time: 12/31/21  3:08 AM   Specimen: BLOOD  Result Value Ref Range Status   Specimen Description BLOOD BLOOD LEFT HAND  Final   Special Requests   Final    BOTTLES DRAWN AEROBIC AND ANAEROBIC Blood Culture adequate volume   Culture   Final    NO GROWTH 5 DAYS Performed at [facility], [address], [place], [place] [address]    Report Status 01/05/2022 FINAL  Final  Aerobic/Anaerobic Culture w Gram Stain (surgical/deep wound)     Status: None   Collection Time: 01/06/22  3:30 PM   Specimen: Ankle;  Wound  Result Value Ref Range Status   Specimen Description   Final    ANKLE Performed at [facility], [address] [place], [place] [address]    Special Requests   Final    Normal Performed at [facility], [address] [place], [place] [address]    Gram Stain   Final    RARE WBC PRESENT, PREDOMINANTLY MONONUCLEAR NO ORGANISMS SEEN    Culture   Final    No growth aerobically or anaerobically. Performed at [facility], [place], [place], [place] [address]    Report Status 01/11/2022 FINAL  Final    Coagulation Studies: No results for input(s): LABPROT, INR in the last 72 hours.  Urinalysis: No results for input(s): COLORURINE, LABSPEC, PHURINE, GLUCOSEU, HGBUR, BILIRUBINUR, KETONESUR, PROTEINUR, UROBILINOGEN, NITRITE, LEUKOCYTESUR in the last 72 hours.  Invalid input(s): APPERANCEUR     Imaging: No results found.   Medications:    sodium chloride Stopped (12/30/21 1542)   sodium chloride Stopped (01/02/22 0910)   anidulafungin 100 mg (01/11/22 1231)   ertapenem 1,000 mg (01/11/22 0930)    amiodarone  200 mg Oral Daily   Chlorhexidine Gluconate Cloth  6 each Topical Daily   enoxaparin (LOVENOX) injection  40 mg Subcutaneous  Q24H   hydrocerin   Topical TID   levalbuterol  2 puff Inhalation Q8H   magnesium oxide  400 mg Oral BID   melatonin  5 mg Oral QHS   midodrine  5 mg Oral TID WC   multivitamin with minerals  1 tablet Oral Daily   pantoprazole  40 mg Oral BID   pneumococcal 23 valent vaccine  0.5 mL Intramuscular Tomorrow-1000   sodium chloride flush  3 mL Intravenous Q12H   sodium chloride flush  3 mL Intravenous Q12H   tamsulosin  0.4 mg Oral Daily   tiotropium  1 capsule Inhalation Daily   traZODone  50 mg Oral QHS   sodium chloride, acetaminophen **OR** acetaminophen, menthol-cetylpyridinium, ondansetron **OR** ondansetron (ZOFRAN) IV, oxybutynin, oxyCODONE, sodium chloride flush  Assessment/ Plan:  Mr. Wayne Burns is a 66 y.o.  male with past medical conditions including COPD and hypertension , who was admitted to [facility] on 12/26/2021 for Atrial fibrillation with rapid ventricular response ([place]) [I48.91] Acute cystitis without hematuria [N30.00] AKI (acute kidney injury) ([place]) [N17.9] Septic shock ([place]) [A41.9, R65.21] Sepsis ([place]) [A41.9] Sepsis, due to unspecified organism, unspecified whether acute organ dysfunction present ([place]) [A41.9]   Acute kidney injury with chronic kidney disease stage IV.  Baseline appears to be 2.8 with GFR 25 on 12/22/2021.  AKI may be due to possible obstruction from migrating stent recently placed [facility].    Renal function continues to improve.  Adequate urine output recorded.  We will continue to monitor patient during hospitalization.  May not visit daily.  Lab Results  Component Value Date   CREATININE 2.51 (H) 01/11/2022   CREATININE 2.76 (H) 01/10/2022   CREATININE 2.78 (H) 01/09/2022    Intake/Output Summary (Last 24 hours) at 01/11/2022 1253 Last data filed at 01/11/2022 0618 Gross per 24 hour  Intake 643.45 ml  Output 2450 ml  Net -1806.55 ml    2.  Hypotension.  Currently receives midodrine 3 times daily.  BP stable  3.  Acute metabolic acidosis.  Resolved  4. Septic shock with ESBL acute cystitis and candida glabrata fungemia.  Recommendation made to continue antibiotics at discharge however unable to secure home health agency to provide services.    Antibiotic therapy through 01/14/2022.  Will remain hospitalized for this therapy.   5. Lower extremity edema  Slightly improved.  Patient encouraged to elevate lower extremities.    LOS: [employer] 3/9/202312:53 PM

## 2022-01-12 DIAGNOSIS — I2510 Atherosclerotic heart disease of native coronary artery without angina pectoris: Secondary | ICD-10-CM | POA: Diagnosis present

## 2022-01-12 DIAGNOSIS — I5042 Chronic combined systolic (congestive) and diastolic (congestive) heart failure: Secondary | ICD-10-CM | POA: Diagnosis present

## 2022-01-12 LAB — MAGNESIUM: Magnesium: 1.6 mg/dL — ABNORMAL LOW (ref 1.7–2.4)

## 2022-01-12 LAB — RENAL FUNCTION PANEL
Albumin: 2.7 g/dL — ABNORMAL LOW (ref 3.5–5.0)
Anion gap: 8 (ref 5–15)
BUN: 21 mg/dL (ref 8–23)
CO2: 24 mmol/L (ref 22–32)
Calcium: 8.7 mg/dL — ABNORMAL LOW (ref 8.9–10.3)
Chloride: 103 mmol/L (ref 98–111)
Creatinine, Ser: 2.5 mg/dL — ABNORMAL HIGH (ref 0.61–1.24)
GFR, Estimated: 28 mL/min — ABNORMAL LOW (ref >60)
Glucose, Bld: 94 mg/dL (ref 70–99)
Phosphorus: 3.7 mg/dL (ref 2.5–4.6)
Potassium: 3.8 mmol/L (ref 3.5–5.1)
Sodium: 135 mmol/L (ref 135–145)

## 2022-01-12 MED ORDER — MAGNESIUM SULFATE 2 GM/50ML IV SOLN
2.0000 g | Freq: Once | INTRAVENOUS | Status: AC
  Administered 2022-01-12: 2 g via INTRAVENOUS
  Filled 2022-01-12: qty 50

## 2022-01-12 NOTE — Assessment & Plan Note (Signed)
POA, improved. ?

## 2022-01-12 NOTE — Assessment & Plan Note (Addendum)
No longer exacerbated, not wheezing and has good air movement on exam, on room air.  Appears not on any inhalers at home. ? ? ?

## 2022-01-12 NOTE — Assessment & Plan Note (Addendum)
Chronic, stable - no active or recent chest pain.  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  Intervention deferred & aggressive secondary prevention recommended. ?--continue ASA & Lipitor  ?-- Was also on amlodipine at home, held due to hypotension & discontinued at discharge ?-- Recommend adding ACE/ARB for GDMT as BP tolerates.  ?--Re-establish care with Dr. Clayborn Bigness and follow up 1-2 weeks after d/c ?

## 2022-01-12 NOTE — Progress Notes (Signed)
Patient alert and oriented, complained of leg pain, PRN oxycodone given x 1. Patient refused bladder scan this shift, self catheterization x2 done successfully, will continue  to monitor. ? ?

## 2022-01-12 NOTE — Progress Notes (Signed)
?Progress Note ? ? ?PatientJayland Burns ZJI:[id number] DOB: 03/04/1956 DOA: 12/26/2021     16 ?DOS: the patient was seen and examined on 01/12/2022 ?  ?Brief hospital course: ?66 year old man with COPD and hypertension.  Brought in initially by family under IVC after being found in very poor living conditions and not taking care of himself.   ? ?The patient was found to be in septic shock with ESBL E. coli with acute cystitis and also Candida glabrata fungemia.  Patient was seen by infectious disease and will be on IV Invanz and IV Eraxis through 01/14/2022.  Unfortunately will have to stay the whole course of antibiotics here in the hospital because home health unavailable to be set up. ? ?Patient also has acute kidney injury on superimposed on chronic kidney disease.  The patient had displacement of right ureteral stent that was initially done at [facility].  Dr. Bernardo Heater replaced the stent on 01/02/2022 and will have to follow-up with him for stent removal. ? ?Of note, patient recently admitted at [facility], discharged after a complicated 17-PZW admission on 12/22/2021.  For summary of that admission, see Care Everywhere for d/c summary and or Urology Consult Note of 12/27/2021. ? ?Patient also had A-fib with RVR, initially required IV amiodarone, now on oral amiodarone. ? ?The patient did have COVID-19 virus infection diagnosed at [facility] on 12/12/2021, treated with remdesivir and steroids. ? ?Assessment and Plan: ?* Septic shock ([place]) ?ESBL E. coli acute cystitis and Candida glabrata fungemia.  Patient will be on IV antibiotics through 3/12.  IV Invanz and IV Eraxis. ? ?Candida glabrata infection ?Continue Eraxis through 01/14/2022. ? 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  After 3 doses of torsemide, Cr went up to 4.75 on 01/07/2022.  Given a fluid bolus and Cr improved to 2.76.   ?Recent baseline Cr at [facility] on 12/22/2021 was 2.8.  Patient does in and out  catheterizations. ? ?Creatinine today 2.50 ? ?Anemia of chronic disease ?Patient transfused on a hemoglobin of 7.1 on 01/03/2022.  Last Hbg 8.7, stable. ? ?Atrial fibrillation with RVR ([place]) ?Continue oral amiodarone.  Cardiology holding off on anticoagulation with the patient's anemia. ? ?Displacement of ureteral stent ([place]) ?Patient had right stent malposition.  Dr. Bernardo Heater did stent repositioning on 01/02/2022. ?Now stable without issues. ?Urology follow up after discharge. ? ?Chronic osteomyelitis ([place]) ?Patient has had a chronic wound that is been draining on his right ankle for years.  I am not seeing any drainage today.  He stated he was treated with a PICC line and IV antibiotics in the past.   Has persistent R pedal edema. ?--Wound cultures here negative ? ?COVID-19 virus infection ?Previous diagnosis at [facility].  Asymptomatic. ? ?Hypomagnesemia ?Continue oral magnesium.  Magnesium 1.6 today, replacing with 2 g IV Mg-sulfate.  Also on PO Mg-ox, continue.  Monitor Mg and replace PRN. ? ?Elevated troponin ?Elevated troponin due to demand ischemia in the setting of sepsis.  Patient is having no chest pain and EKG nonacute.  Outpatient cardiology follow-up. ? ? ?Depression ?Seen by psychiatry and no further recommendations.  Patient declined Zoloft. ? ?Abnormal LFTs (liver function tests) ?POA, improved. ? ?CAD (coronary artery disease) ?Chronic, stable - no active or recent chest pain.  Cardiac cath performed on 12/13/20 at [facility] for code STEMI is available in care everywhere -- it resulted with 25% distal LM disease, subtotally occluded mid-LAD at the stent level, chronically occluded mid-Lcx and occluded  proximal RCA.  Intervention deferred & aggressive secondary prevention recommended. ?--continue ASA & Lipitor  ?--On amlodipine at home, on hold due to hypotension.  ?--Likely transition from amlodipine to ACE/ARB for GDMT as BP tolerates.  ?--Re-establish care with Dr. Clayborn Bigness and follow up 1-2 weeks after  d/c ? ?Chronic combined systolic and diastolic CHF (congestive heart failure) ([place]) ?ECHO 12/12/21 showed severely decreased, LVEF of 25-30% with thinning and akinesis involving the mid inferior and basal  ?inferior segment(s). Grade III diastolic dysfunction.  Normal RV size and systolic function. ? ?BLE edema persistent but appears stable using wraps. ? ?--Cardiology consulted, has signed off. ?--GDMT limited by hypotension ?--Outpatient follow up with Dr. Clayborn Bigness 1-2 weeks after discharge ? ?Hyponatremia ?Resolved. Sodium 136 (3/8). ? ?Lower extremity edema ?Ultrasound negative for DVT.  Holding torsemide at this point with acute kidney injury ? ?Hypotension ?MAP's stable. ?On low-dose midodrine. ? ?COPD (chronic obstructive pulmonary disease) ([place]) ?No longer exacerbated, not wheezing and has good air movement on exam, on room air.  Appears not on inhalers at home. ?Monitor. ? ? ? ? ? ?  ? ?Subjective: Patient up in recliner when seen this morning.  Reports overall feeling well.  His right foot is doing better and he is able to walk on it.  He was just connected to IV for magnesium and reported his IV to be leaking.  Nurse informed.  Patient denies any acute complaints looks forward to going home hopefully Sunday. ? ?Physical Exam: ?Vitals:  ? 01/11/22 1745 01/11/22 2027 01/12/22 0437 01/12/22 0730  ?BP: 114/71 118/75 114/76 105/67  ?Pulse: 87 84 83 91  ?Resp: 16 19 18 19   ?Temp: 98.2 ?F (36.8 ?C) 98.1 ?F (36.7 ?C) 97.8 ?F (36.6 ?C) 97.7 ?F (36.5 ?C)  ?TempSrc: Oral Oral Oral Oral  ?SpO2: 100% 100% 99% 100%  ?Weight:      ?Height:      ? ?General exam: awake, alert, no acute distress ?HEENT: atraumatic, clear conjunctiva, anicteric sclera, moist mucus membranes, hearing grossly normal  ?Respiratory system: CTAB, no wheezes, rales or rhonchi, normal respiratory effort. ?Cardiovascular system: normal S1/S2, RRR, stable appearing lower extremity edema with bilateral wraps in place.   ?Gastrointestinal system: soft,  NT, ND, no HSM felt, +bowel sounds. ?Central nervous system: A&O x3. no gross focal neurologic deficits, normal speech ?Extremities: Ace wraps on bilateral lower extremities with stable appearing edema, edema and erythema of the right foot appears stable to mildly improved ?Skin: dry, intact, normal temperature, stable erythema of the dorsal aspect of right foot, ?Psychiatry: normal mood, congruent affect, judgement and insight appear normal ? ? ?Data Reviewed: ? ?Labs reviewed notable for magnesium 1.6 , creatinine 2.50 stable, calcium 8.7, albumin 2.7 ? ?Family Communication: None at bedside, will attempt to call ? ?Disposition: ?Status is: Inpatient ?Remains inpatient appropriate because: Severity of illness requiring IV antibiotic and antifungal through 3/12.  Home health services were not available, course must be completed inpatient. ? ? ? Planned Discharge Destination: Home ? ? ? ?Time spent: 35 minutes ? ?Author: ?Ezekiel Slocumb, DO ?01/12/2022 12:12 PM ? ?For on call review [email or web address].  ?

## 2022-01-12 NOTE — Care Management Important Message (Signed)
Important Message ? ?Patient Details  ?Name: Wayne Burns ?MRN: [id number] ?Date of Birth: 12/19/1955 ? ? ?Medicare Important Message Given:  Other (see comment) ? ?Patient is in an isolation room so I called his room [phone]) to review the Important Message from Medicare but the line has been busy the last 3 times I called.  Will keep trying. ? ? ?Juliann Pulse A Candace Begue ?01/12/2022, 2:26 PM ?

## 2022-01-12 NOTE — Assessment & Plan Note (Addendum)
ECHO 12/12/21 showed severely decreased, LVEF of 25-30% with thinning and akinesis involving the mid inferior and basal  ?inferior segment(s). Grade III diastolic dysfunction.  Normal RV size and systolic function. ? ?BLE edema persistent but appears stable using wraps. ? ?--Cardiology consulted, has signed off. ?--GDMT limited by hypotension ?--Outpatient follow up with Dr. Clayborn Bigness 1-2 weeks after discharge ?

## 2022-01-13 ENCOUNTER — Inpatient Hospital Stay: Payer: Medicare Other

## 2022-01-13 DIAGNOSIS — R2241 Localized swelling, mass and lump, right lower limb: Secondary | ICD-10-CM | POA: Clinically undetermined

## 2022-01-13 LAB — RENAL FUNCTION PANEL
Albumin: 2.4 g/dL — ABNORMAL LOW (ref 3.5–5.0)
Anion gap: 7 (ref 5–15)
BUN: 19 mg/dL (ref 8–23)
CO2: 25 mmol/L (ref 22–32)
Calcium: 8.6 mg/dL — ABNORMAL LOW (ref 8.9–10.3)
Chloride: 103 mmol/L (ref 98–111)
Creatinine, Ser: 2.42 mg/dL — ABNORMAL HIGH (ref 0.61–1.24)
GFR, Estimated: 29 mL/min — ABNORMAL LOW (ref >60)
Glucose, Bld: 106 mg/dL — ABNORMAL HIGH (ref 70–99)
Phosphorus: 3.5 mg/dL (ref 2.5–4.6)
Potassium: 3.7 mmol/L (ref 3.5–5.1)
Sodium: 135 mmol/L (ref 135–145)

## 2022-01-13 LAB — MAGNESIUM: Magnesium: 1.9 mg/dL (ref 1.7–2.4)

## 2022-01-13 NOTE — Progress Notes (Signed)
[facility]  ROUNDING NOTE   Subjective:   Patient resting in bed, in no acute distress. Renal function back to baseline. Patient denies SOB, nausea and vomiting, reports good appetite.   03/10 0701 - 03/11 0700 In: 625.1 [IV Piggyback:625.1] Out: -  Lab Results  Component Value Date   CREATININE 2.42 (H) 01/13/2022   CREATININE 2.50 (H) 01/12/2022   CREATININE 2.51 (H) 01/11/2022    Creatinine stable Urine output adequate 3.6 L.  Objective:  Vital signs in last 24 hours:  Temp:  [98 F (36.7 C)-99.2 F (37.3 C)] 98 F (36.7 C) (03/11 0747) Pulse Rate:  [87-97] 97 (03/11 0747) Resp:  [18] 18 (03/11 0747) BP: (105-124)/(70-77) 117/73 (03/11 0747) SpO2:  [97 %-100 %] 100 % (03/11 0747)  Weight change:  Filed Weights   12/28/21 1500 12/31/21 1741 01/02/22 1118  Weight: 92.6 kg 91.4 kg 91.4 kg    Intake/Output: I/O last 3 completed shifts: In: 1345.1 [P.O.:720; IV Piggyback:625.1] Out: 3700 [Urine:3700]   Intake/Output this shift:  No intake/output data recorded.  Physical Exam: General: Pleasant and appears comfortable in bed  Head:  Moist oral mucosal membranes  Eyes: Anicteric  Lungs:  Respirations even,unlabored, lungs clear to auscultation  Heart: Regular rate and rhythm  Abdomen:  Soft, nontender, obese  Extremities: 2+ peripheral edema,feet erythematous  Neurologic: Awake,alert,oriented  Skin: No acute skin rash, Right foot swollen,erythematous       Basic Metabolic Panel: Recent Labs  Lab 01/09/22 0644 01/10/22 0620 01/11/22 0452 01/12/22 0540 01/13/22 0421  NA 134* 136 139 135 135  K 4.0 3.9 4.0 3.8 3.7  CL 98 103 104 103 103  CO2 24 26 25 24 25   GLUCOSE 95 112* 98 94 106*  BUN 27* 26* 24* 21 19  CREATININE 2.78* 2.76* 2.51* 2.50* 2.42*  CALCIUM 8.9 8.6* 8.9 8.7* 8.6*  MG 1.8 2.0 1.7 1.6* 1.9  PHOS 4.1 4.1 4.0 3.7 3.5     Liver Function Tests: Recent Labs  Lab 01/09/22 0644 01/10/22 0620 01/11/22 0452 01/12/22 0540  01/13/22 0421  ALBUMIN 2.7* 2.5* 2.4* 2.7* 2.4*    No results for input(s): LIPASE, AMYLASE in the last 168 hours. No results for input(s): AMMONIA in the last 168 hours.  CBC: Recent Labs  Lab 01/07/22 0442 01/08/22 0509 01/09/22 0644 01/10/22 0620 01/11/22 0452  WBC 6.5 5.5 5.7 5.3 5.0  NEUTROABS 4.5 3.8 4.0 3.8 3.7  HGB 9.1* 9.0* 9.0* 8.8* 8.7*  HCT 28.7* 28.4* 28.8* 28.6* 28.1*  MCV 87.8 87.9 88.9 89.7 91.2  PLT 155 162 161 151 150     Cardiac Enzymes: No results for input(s): CKTOTAL, CKMB, CKMBINDEX, TROPONINI in the last 168 hours.   BNP: Invalid input(s): POCBNP  CBG: Recent Labs  Lab 01/07/22 1139 01/07/22 1640 01/07/22 2135 01/08/22 0829 01/08/22 2059  GLUCAP 138* 122* 123* 90 118*     Microbiology: Results for orders placed or performed during the hospital encounter of 12/26/21  Resp Panel by RT-PCR (Flu A&B, Covid)     Status: Abnormal   Collection Time: 12/26/21 10:33 PM   Specimen: Nasopharyngeal(NP) swabs in vial transport medium  Result Value Ref Range Status   SARS Coronavirus 2 by RT PCR POSITIVE (A) NEGATIVE Final    Comment: (NOTE) SARS-CoV-2 target nucleic acids are DETECTED.  The SARS-CoV-2 RNA is generally detectable in upper respiratory specimens during the acute phase of infection. Positive results are indicative of the presence of the identified virus, but do not  rule out bacterial infection or co-infection with other pathogens not detected by the test. Clinical correlation with patient history and other diagnostic information is necessary to determine patient infection status. The expected result is Negative.  Fact Sheet for Patients: EntrepreneurPulse.com.au  Fact Sheet for Healthcare Providers: IncredibleEmployment.be  This test is not yet approved or cleared by the [place] FDA and  has been authorized for detection and/or diagnosis of SARS-CoV-2 by FDA under an Emergency Use  Authorization (EUA).  This EUA will remain in effect (meaning this test can be used) for the duration of  the COVID-19 declaration under Section 564(b)(1) of the A ct, 21 U.S.C. section 360bbb-3(b)(1), unless the authorization is terminated or revoked sooner.     Influenza A by PCR NEGATIVE NEGATIVE Final   Influenza B by PCR NEGATIVE NEGATIVE Final    Comment: (NOTE) The Xpert Xpress SARS-CoV-2/FLU/RSV plus assay is intended as an aid in the diagnosis of influenza from Nasopharyngeal swab specimens and should not be used as a sole basis for treatment. Nasal washings and aspirates are unacceptable for Xpert Xpress SARS-CoV-2/FLU/RSV testing.  Fact Sheet for Patients: EntrepreneurPulse.com.au  Fact Sheet for Healthcare Providers: IncredibleEmployment.be  This test is not yet approved or cleared by the [place] FDA and has been authorized for detection and/or diagnosis of SARS-CoV-2 by FDA under an Emergency Use Authorization (EUA). This EUA will remain in effect (meaning this test can be used) for the duration of the COVID-19 declaration under Section 564(b)(1) of the Act, 21 U.S.C. section 360bbb-3(b)(1), unless the authorization is terminated or revoked.  Performed at [facility], [address] [place], [place] [address]   Blood culture (routine x 2)     Status: None   Collection Time: 12/26/21 10:33 PM   Specimen: BLOOD  Result Value Ref Range Status   Specimen Description BLOOD LEFT ASSIST CONTROL  Final   Special Requests   Final    BOTTLES DRAWN AEROBIC AND ANAEROBIC Blood Culture adequate volume   Culture   Final    NO GROWTH 5 DAYS Performed at [facility], [address], [place], [place] [address]    Report Status 12/31/2021 FINAL  Final  Blood culture (routine x 2)     Status: Abnormal   Collection Time: 12/26/21 10:46 PM   Specimen: BLOOD  Result Value Ref Range Status   Specimen Description    Final    BLOOD RIGHT ANTECUBITAL Performed at [facility], [place] [address] [place], [place] [address]    Special Requests   Final    BOTTLES DRAWN AEROBIC AND ANAEROBIC Blood Culture adequate volume Performed at [facility], [address] [place], [place] [address]    Culture  Setup Time   Final    GRAM NEGATIVE RODS ANAEROBIC BOTTLE ONLY Organism ID to follow CRITICAL RESULT CALLED TO, READ BACK BY AND VERIFIED WITH: [facility] MITCHELL OVZCHY 8502 12/27/21 HNM    Culture (A)  Final    ESCHERICHIA COLI Confirmed Extended Spectrum Beta-Lactamase Producer (ESBL).  In bloodstream infections from ESBL organisms, carbapenems are preferred over piperacillin/tazobactam. They are shown to have a lower risk of mortality.    Report Status 12/29/2021 FINAL  Final   Organism ID, Bacteria ESCHERICHIA COLI  Final      Susceptibility   Escherichia coli - MIC*    AMPICILLIN >=32 RESISTANT Resistant     CEFAZOLIN >=64 RESISTANT Resistant     CEFEPIME 16 RESISTANT Resistant  CEFTAZIDIME RESISTANT Resistant     CEFTRIAXONE >=64 RESISTANT Resistant     CIPROFLOXACIN <=0.25 SENSITIVE Sensitive     GENTAMICIN <=1 SENSITIVE Sensitive     IMIPENEM <=0.25 SENSITIVE Sensitive     TRIMETH/SULFA <=20 SENSITIVE Sensitive     AMPICILLIN/SULBACTAM 8 SENSITIVE Sensitive     PIP/TAZO <=4 SENSITIVE Sensitive     * ESCHERICHIA COLI  Blood Culture ID Panel (Reflexed)     Status: Abnormal   Collection Time: 12/26/21 10:46 PM  Result Value Ref Range Status   Enterococcus faecalis NOT DETECTED NOT DETECTED Final   Enterococcus Faecium NOT DETECTED NOT DETECTED Final   Listeria monocytogenes NOT DETECTED NOT DETECTED Final   Staphylococcus species NOT DETECTED NOT DETECTED Final   Staphylococcus aureus (BCID) NOT DETECTED NOT DETECTED Final   Staphylococcus epidermidis NOT DETECTED NOT DETECTED Final   Staphylococcus lugdunensis NOT DETECTED NOT DETECTED Final   Streptococcus species NOT  DETECTED NOT DETECTED Final   Streptococcus agalactiae NOT DETECTED NOT DETECTED Final   Streptococcus pneumoniae NOT DETECTED NOT DETECTED Final   Streptococcus pyogenes NOT DETECTED NOT DETECTED Final   A.calcoaceticus-baumannii NOT DETECTED NOT DETECTED Final   Bacteroides fragilis NOT DETECTED NOT DETECTED Final   Enterobacterales DETECTED (A) NOT DETECTED Final    Comment: Enterobacterales represent a large order of gram negative bacteria, not a single organism. CRITICAL RESULT CALLED TO, READ BACK BY AND VERIFIED WITH: DEVAN MITCHELL PHARMD 1126 12/27/21 HNM    Enterobacter cloacae complex NOT DETECTED NOT DETECTED Final   Escherichia coli DETECTED (A) NOT DETECTED Final    Comment: CRITICAL RESULT CALLED TO, READ BACK BY AND VERIFIED WITH: DEVAN MITCHELL CVELFY 1017 12/27/21 HNM    Klebsiella aerogenes NOT DETECTED NOT DETECTED Final   Klebsiella oxytoca NOT DETECTED NOT DETECTED Final   Klebsiella pneumoniae NOT DETECTED NOT DETECTED Final   Proteus species NOT DETECTED NOT DETECTED Final   Salmonella species NOT DETECTED NOT DETECTED Final   Serratia marcescens NOT DETECTED NOT DETECTED Final   Haemophilus influenzae NOT DETECTED NOT DETECTED Final   Neisseria meningitidis NOT DETECTED NOT DETECTED Final   Pseudomonas aeruginosa NOT DETECTED NOT DETECTED Final   Stenotrophomonas maltophilia NOT DETECTED NOT DETECTED Final   Candida albicans NOT DETECTED NOT DETECTED Final   Candida auris NOT DETECTED NOT DETECTED Final   Candida glabrata NOT DETECTED NOT DETECTED Final   Candida krusei NOT DETECTED NOT DETECTED Final   Candida parapsilosis NOT DETECTED NOT DETECTED Final   Candida tropicalis NOT DETECTED NOT DETECTED Final   Cryptococcus neoformans/gattii NOT DETECTED NOT DETECTED Final   CTX-M ESBL DETECTED (A) NOT DETECTED Final    Comment: CRITICAL RESULT CALLED TO, READ BACK BY AND VERIFIED WITH: DEVAN MITCHELL PHARMD 1126 12/27/21 HNM (NOTE) Extended spectrum  beta-lactamase detected. Recommend a carbapenem as initial therapy.      Carbapenem resistance IMP NOT DETECTED NOT DETECTED Final   Carbapenem resistance KPC NOT DETECTED NOT DETECTED Final   Carbapenem resistance NDM NOT DETECTED NOT DETECTED Final   Carbapenem resist OXA 48 LIKE NOT DETECTED NOT DETECTED Final   Carbapenem resistance VIM NOT DETECTED NOT DETECTED Final    Comment: Performed at [facility], [address] [place], [place] [address]  Urine Culture     Status: Abnormal   Collection Time: 12/27/21  9:59 AM   Specimen: Urine, Clean Catch  Result Value Ref Range Status   Specimen Description   Final    URINE, CLEAN CATCH Performed at  [facility], [address], [place], [place] [address]    Special Requests   Final    NONE Performed at [facility], [address] [place], [place] [address]    Culture (A)  Final    <10,000 COLONIES/mL INSIGNIFICANT GROWTH Performed at [facility] [address], [place], [place] [address]    Report Status 12/28/2021 FINAL  Final  Antifungal AST 9 Drug Panel     Status: None   Collection Time: 12/28/21  6:21 AM  Result Value Ref Range Status   Organism ID, Yeast Candida glabrata  Corrected    Comment: (NOTE) Identification performed by account, not confirmed by this laboratory. CORRECTED ON 03/02 AT 1136: PREVIOUSLY REPORTED AS Preliminary report    Amphotericin B MIC 1.0 ug/mL  Final    Comment: (NOTE) Breakpoints have been established for only some organism-drug combinations as indicated. This test was developed and its performance characteristics determined by [facility]. It has not been cleared or approved by the Food and Drug Administration.    Anidulafungin MIC Comment  Final    Comment: (NOTE) 0.06 ug/mL Susceptible Breakpoints have been established for only some organism-drug combinations as indicated. This test was developed and its performance characteristics determined  by [facility]. It has not been cleared or approved by the Food and Drug Administration.    Caspofungin MIC Comment  Final    Comment: (NOTE) 0.12 ug/mL Susceptible Breakpoints have been established for only some organism-drug combinations as indicated. This test was developed and its performance characteristics determined by [facility]. It has not been cleared or approved by the Food and Drug Administration.    Micafungin MIC Comment  Final    Comment: (NOTE) 0.016 ug/mL Susceptible Breakpoints have been established for only some organism-drug combinations as indicated. This test was developed and its performance characteristics determined by [facility]. It has not been cleared or approved by the Food and Drug Administration.    Posaconazole MIC 1.0 ug/mL  Final    Comment: (NOTE) Breakpoints have been established for only some organism-drug combinations as indicated. This test was developed and its performance characteristics determined by [facility]. It has not been cleared or approved by the Food and Drug Administration.    Fluconazole Islt MIC 16.0 ug/mL  Final    Comment: (NOTE) Susceptible Dose Dependent Breakpoints have been established for only some organism-drug combinations as indicated. This test was developed and its performance characteristics determined by [facility]. It has not been cleared or approved by the Food and Drug Administration.    Flucytosine MIC 0.06 ug/mL  Final    Comment: (NOTE) Breakpoints have been established for only some organism-drug combinations as indicated. This test was developed and its performance characteristics determined by [facility]. It has not been cleared or approved by the Food and Drug Administration.    Itraconazole MIC 1.0 ug/mL  Final    Comment: (NOTE) Breakpoints have been established for only some organism-drug combinations as indicated. This test was developed and its performance characteristics determined by [facility]. It has  not been cleared or approved by the Food and Drug Administration.    Voriconazole MIC 0.25 ug/mL  Final    Comment: (NOTE) Breakpoints have been established for only some organism-drug combinations as indicated. This test was developed and its performance characteristics determined by [facility]. It has not been cleared or approved by the Food and Drug Administration. Performed At: [facility] [place], [place] [id number] Rush Farmer MD WL:[phone]  Source CANDIDA GLABRATA FROM BLOOD  Final    Comment: Performed at [facility], [place] [address] [place], [place] [address]  MRSA Next Gen by PCR, Nasal     Status: Abnormal   Collection Time: 12/28/21  3:10 PM   Specimen: Nasal Mucosa; Nasal Swab  Result Value Ref Range Status   MRSA by PCR Next Gen DETECTED (A) NOT DETECTED Final    Comment: RESULT CALLED TO, READ BACK BY AND VERIFIED WITH: GINA SHANNON 1701 12/28/21 MU (NOTE) The GeneXpert MRSA Assay (FDA approved for NASAL specimens only), is one component of a comprehensive MRSA colonization surveillance program. It is not intended to diagnose MRSA infection nor to guide or monitor treatment for MRSA infections. Test performance is not FDA approved in patients less than 82 years old. Performed at [facility], [address] [place], [place] [address]   CULTURE, BLOOD (ROUTINE X 2) w Reflex to ID Panel     Status: None   Collection Time: 12/29/21  6:21 AM   Specimen: BLOOD  Result Value Ref Range Status   Specimen Description BLOOD RIGHT [facility]  Final   Special Requests   Final    BOTTLES DRAWN AEROBIC AND ANAEROBIC Blood Culture adequate volume   Culture   Final    NO GROWTH 5 DAYS Performed at [facility], [address], [place], [place] [address]    Report Status 01/03/2022 FINAL  Final  CULTURE, BLOOD (ROUTINE X 2) w Reflex to ID Panel     Status: Abnormal   Collection Time: 12/29/21  6:21 AM   Specimen: BLOOD   Result Value Ref Range Status   Specimen Description   Final    BLOOD RIGHT HAND Performed at [facility], [address], [place], [place] [address]    Special Requests   Final    IN PEDIATRIC BOTTLE Blood Culture adequate volume Performed at [facility], [address] [place], [place] [address]    Culture  Setup Time   Final    YEAST IN PEDIATRIC BOTTLE Organism ID to follow CRITICAL RESULT CALLED TO, READ BACK BY AND VERIFIED WITH: BRANDON BEERS @1310  12/30/21 SCS    Culture (A)  Final    CANDIDA GLABRATA SEE SEPARATE REPORT Performed at [facility], [place] [address] [place],  [address]    Report Status 01/04/2022 FINAL  Final  Blood Culture ID Panel (Reflexed)     Status: Abnormal   Collection Time: 12/29/21  6:21 AM  Result Value Ref Range Status   Enterococcus faecalis NOT DETECTED NOT DETECTED Final   Enterococcus Faecium NOT DETECTED NOT DETECTED Final   Listeria monocytogenes NOT DETECTED NOT DETECTED Final   Staphylococcus species NOT DETECTED NOT DETECTED Final   Staphylococcus aureus (BCID) NOT DETECTED NOT DETECTED Final   Staphylococcus epidermidis NOT DETECTED NOT DETECTED Final   Staphylococcus lugdunensis NOT DETECTED NOT DETECTED Final   Streptococcus species NOT DETECTED NOT DETECTED Final   Streptococcus agalactiae NOT DETECTED NOT DETECTED Final   Streptococcus pneumoniae NOT DETECTED NOT DETECTED Final   Streptococcus pyogenes NOT DETECTED NOT DETECTED Final   A.calcoaceticus-baumannii NOT DETECTED NOT DETECTED Final   Bacteroides fragilis NOT DETECTED NOT DETECTED Final   Enterobacterales NOT DETECTED NOT DETECTED Final   Enterobacter cloacae complex NOT DETECTED NOT DETECTED Final   Escherichia coli NOT DETECTED NOT DETECTED Final   Klebsiella aerogenes NOT DETECTED NOT DETECTED Final   Klebsiella oxytoca NOT DETECTED NOT DETECTED  Final   Klebsiella pneumoniae NOT DETECTED NOT DETECTED Final   Proteus species NOT  DETECTED NOT DETECTED Final   Salmonella species NOT DETECTED NOT DETECTED Final   Serratia marcescens NOT DETECTED NOT DETECTED Final   Haemophilus influenzae NOT DETECTED NOT DETECTED Final   Neisseria meningitidis NOT DETECTED NOT DETECTED Final   Pseudomonas aeruginosa NOT DETECTED NOT DETECTED Final   Stenotrophomonas maltophilia NOT DETECTED NOT DETECTED Final   Candida albicans NOT DETECTED NOT DETECTED Final   Candida auris NOT DETECTED NOT DETECTED Final   Candida glabrata DETECTED (A) NOT DETECTED Final    Comment: CRITICAL RESULT CALLED TO, READ BACK BY AND VERIFIED WITH: BRANDON BEERS @1310  12/30/21 SCS    Candida krusei NOT DETECTED NOT DETECTED Final   Candida parapsilosis NOT DETECTED NOT DETECTED Final   Candida tropicalis NOT DETECTED NOT DETECTED Final   Cryptococcus neoformans/gattii NOT DETECTED NOT DETECTED Final    Comment: Performed at [facility], [address] [place], [place] [address]  CULTURE, BLOOD (ROUTINE X 2) w Reflex to ID Panel     Status: None   Collection Time: 12/31/21  3:08 AM   Specimen: BLOOD  Result Value Ref Range Status   Specimen Description BLOOD BLOOD RIGHT HAND  Final   Special Requests   Final    BOTTLES DRAWN AEROBIC AND ANAEROBIC Blood Culture adequate volume   Culture   Final    NO GROWTH 5 DAYS Performed at [facility], [address] [place], [place] [address]    Report Status 01/05/2022 FINAL  Final  CULTURE, BLOOD (ROUTINE X 2) w Reflex to ID Panel     Status: None   Collection Time: 12/31/21  3:08 AM   Specimen: BLOOD  Result Value Ref Range Status   Specimen Description BLOOD BLOOD LEFT HAND  Final   Special Requests   Final    BOTTLES DRAWN AEROBIC AND ANAEROBIC Blood Culture adequate volume   Culture   Final    NO GROWTH 5 DAYS Performed at [facility], [address] [place] [address] [place], [place] [address]    Report Status 01/05/2022 FINAL  Final  Aerobic/Anaerobic Culture w Gram Stain  (surgical/deep wound)     Status: None   Collection Time: 01/06/22  3:30 PM   Specimen: Ankle; Wound  Result Value Ref Range Status   Specimen Description   Final    ANKLE Performed at [facility], [address] [place], [place] [address]    Special Requests   Final    Normal Performed at [facility], [address] [place], [place] [address]    Gram Stain   Final    RARE WBC PRESENT, PREDOMINANTLY MONONUCLEAR NO ORGANISMS SEEN    Culture   Final    No growth aerobically or anaerobically. Performed at [facility], [place] [address], [place], [place] [address]    Report Status 01/11/2022 FINAL  Final    Coagulation Studies: No results for input(s): LABPROT, INR in the last 72 hours.  Urinalysis: No results for input(s): COLORURINE, LABSPEC, PHURINE, GLUCOSEU, HGBUR, BILIRUBINUR, KETONESUR, PROTEINUR, UROBILINOGEN, NITRITE, LEUKOCYTESUR in the last 72 hours.  Invalid input(s): APPERANCEUR     Imaging: No results found.   Medications:    sodium chloride Stopped (12/30/21 1542)   sodium chloride Stopped (01/02/22 0910)   anidulafungin 100 mg (01/12/22 1413)   ertapenem 1,000 mg (01/13/22 0841)    amiodarone  200 mg Oral Daily   enoxaparin (  LOVENOX) injection  40 mg Subcutaneous Q24H   hydrocerin   Topical TID   levalbuterol  2 puff Inhalation Q8H   magnesium oxide  400 mg Oral BID   melatonin  5 mg Oral QHS   midodrine  5 mg Oral TID WC   multivitamin with minerals  1 tablet Oral Daily   pantoprazole  40 mg Oral BID   pneumococcal 23 valent vaccine  0.5 mL Intramuscular Tomorrow-1000   sodium chloride flush  3 mL Intravenous Q12H   tamsulosin  0.4 mg Oral Daily   tiotropium  1 capsule Inhalation Daily   traZODone  50 mg Oral QHS   sodium chloride, acetaminophen **OR** acetaminophen, menthol-cetylpyridinium, ondansetron **OR** ondansetron (ZOFRAN) IV, oxybutynin, oxyCODONE, sodium chloride flush  Assessment/ Plan:  Wayne Burns  is a 66 y.o.  male with past medical conditions including COPD and hypertension , who was admitted to [facility] on 12/26/2021 for Atrial fibrillation with rapid ventricular response ([place]) [I48.91] Acute cystitis without hematuria [N30.00] AKI (acute kidney injury) ([place]) [N17.9] Septic shock ([place]) [A41.9, R65.21] Sepsis ([place]) [A41.9] Sepsis, due to unspecified organism, unspecified whether acute organ dysfunction present ([place]) [A41.9]   Acute kidney injury with chronic kidney disease stage IV.  Baseline appears to be 2.8 with GFR 25 on 12/22/2021.  AKI may be due to possible obstruction from migrating stent recently placed at [facility].      Lab Results  Component Value Date   CREATININE 2.42 (H) 01/13/2022   CREATININE 2.50 (H) 01/12/2022   CREATININE 2.51 (H) 01/11/2022    Intake/Output Summary (Last 24 hours) at 01/13/2022 1019 Last data filed at 01/12/2022 1800 Gross per 24 hour  Intake 625.1 ml  Output --  Net 625.1 ml   Renal function improved to baseline.No requirement for dialysis.   2.  Hypotension.   Blood pressure in acceptable range today,117/73 mm of Hg Continue Midodrine 5 mg TID   3.  Acute metabolic acidosis.  Resolved  4. Septic shock with ESBL acute cystitis and candida glabrata fungemia.   Receiving antibiotic treatment ,last dose supposed to be on 01/14/2022  5. Lower extremity edema  Bilateral lower extremities with 2 +edema, Rt foot erythematous             Primary team considering Xray and podiatry consult if required  6.Hyponatremia Resolved, Na+ 135 today   LOS: [place] 3/11/202310:19 AM

## 2022-01-13 NOTE — Assessment & Plan Note (Addendum)
Patient reports swelling of the right foot for 2 weeks.  This is minimally improved.  Erythema present but not differential warmth.  Does have chronic osteomyelitis apparently involving the ankle due to prior surgeries.  Right foot x-ray on 3/11 showed only soft tissue swelling without any subcutaneous gas or other complications. ?-- Follow-up with orthopedic or podiatry as outpatient if persistent or worsening ?-- Treated with antibiotics as outlined for bacteremia ?

## 2022-01-13 NOTE — Progress Notes (Signed)
?Progress Note ? ? ?PatientRustin Burns VQM:[id number] DOB: Jun 05, 1956 DOA: 12/26/2021     17 ?DOS: the patient was seen and examined on 01/13/2022 ?  ?Brief hospital course: ?66 year old man with COPD and hypertension.  Brought in initially by family under IVC after being found in very poor living conditions and not taking care of himself.   ? ?The patient was found to be in septic shock with ESBL E. coli with acute cystitis and also Candida glabrata fungemia.  Patient was seen by infectious disease and will be on IV Invanz and IV Eraxis through 01/14/2022.  Unfortunately will have to stay the whole course of antibiotics here in the hospital because home health unavailable to be set up. ? ?Patient also has acute kidney injury on superimposed on chronic kidney disease.  The patient had displacement of right ureteral stent that was initially done at [facility].  Dr. Bernardo Heater replaced the stent on 01/02/2022 and will have to follow-up with him for stent removal. ? ?Of note, patient recently admitted at [facility], discharged after a complicated [facility]-O[facility] admission on 12/22/2021.  For summary of that admission, see Care Everywhere for d/c summary and or Urology Consult Note of 12/27/2021. ? ?Patient also had A-fib with RVR, initially required IV amiodarone, now on oral amiodarone. ? ?The patient did have COVID-19 virus infection diagnosed at [facility] on 12/12/2021, treated with remdesivir and steroids. ? ?Assessment and Plan: ?* Septic shock ([place]) ?ESBL E. coli acute cystitis and Candida glabrata fungemia.  Patient will be on IV antibiotics through 3/12.  IV Invanz and IV Eraxis. ? ?Candida glabrata infection ?Continue Eraxis through 01/14/2022. ? 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  After 3 doses of torsemide, Cr went up to 4.75 on 01/07/2022.  Given a fluid bolus and Cr improved to 2.76.   ?Recent baseline Cr at [facility] on 12/22/2021 was 2.8.  Patient does in and out  catheterizations. ? ?Creatinine today 2.50 ? ?Anemia of chronic disease ?Patient transfused on a hemoglobin of 7.1 on 01/03/2022.  Last Hbg 8.7, stable. ? ?Atrial fibrillation with RVR ([place]) ?Continue oral amiodarone.  Cardiology holding off on anticoagulation with the patient's anemia. ? ?Displacement of ureteral stent ([place]) ?Patient had right stent malposition.  Dr. Bernardo Heater did stent repositioning on 01/02/2022. ?Now stable without issues. ?Urology follow up after discharge. ? ?Chronic osteomyelitis ([place]) ?Patient has had a chronic wound that is been draining on his right ankle for years.  I am not seeing any drainage today.  He stated he was treated with a PICC line and IV antibiotics in the past.   Has persistent R pedal edema. ?--Wound cultures here negative ? ?COVID-19 virus infection ?Previous diagnosis at [facility].  Asymptomatic. ? ?Hypomagnesemia ?Continue oral magnesium.  Magnesium 1.6 today, replacing with 2 g IV Mg-sulfate.  Also on PO Mg-ox, continue.  Monitor Mg and replace PRN. ? ?Elevated troponin ?Elevated troponin due to demand ischemia in the setting of sepsis.  Patient is having no chest pain and EKG nonacute.  Outpatient cardiology follow-up. ? ? ?Depression ?Seen by psychiatry and no further recommendations.  Patient declined Zoloft. ? ?Abnormal LFTs (liver function tests) ?POA, improved. ? ?Localized swelling of right foot ?Patient reports swelling of the right foot for 2 weeks.  This is minimally improved.  Erythema present but not differential warmth.  Does have chronic osteomyelitis apparently involving the ankle due to prior surgeries. ?-- Right foot x-ray to evaluate further ?-- Consult  podiatry if needed ?-- Completes antibiotics tomorrow ? ?CAD (coronary artery disease) ?Chronic, stable - no active or recent chest pain.  Cardiac cath performed on 12/13/20 at [facility] for code STEMI is available in care everywhere -- it resulted with 25% distal LM disease, subtotally occluded mid-LAD at the stent  level, chronically occluded mid-Lcx and occluded proximal RCA.  Intervention deferred & aggressive secondary prevention recommended. ?--continue ASA & Lipitor  ?--On amlodipine at home, on hold due to hypotension.  ?--Likely transition from amlodipine to ACE/ARB for GDMT as BP tolerates.  ?--Re-establish care with Dr. Clayborn Bigness and follow up 1-2 weeks after d/c ? ?Chronic combined systolic and diastolic CHF (congestive heart failure) ([place]) ?ECHO 12/12/21 showed severely decreased, LVEF of 25-30% with thinning and akinesis involving the mid inferior and basal  ?inferior segment(s). Grade III diastolic dysfunction.  Normal RV size and systolic function. ? ?BLE edema persistent but appears stable using wraps. ? ?--Cardiology consulted, has signed off. ?--GDMT limited by hypotension ?--Outpatient follow up with Dr. Clayborn Bigness 1-2 weeks after discharge ? ?Hyponatremia ?Resolved. Sodium 136 (3/8). ? ?Lower extremity edema ?Ultrasound negative for DVT.  Holding torsemide at this point with acute kidney injury ? ?Hypotension ?MAP's stable. ?On low-dose midodrine. ? ?COPD (chronic obstructive pulmonary disease) ([place]) ?No longer exacerbated, not wheezing and has good air movement on exam, on room air.  Appears not on inhalers at home. ?Monitor. ? ? ? ? ? ?  ? ?Subjective: Patient awake sitting up in bed when seen this morning.  He reports feeling well in general.  Asks why his foot remains swollen.  He said it is nonpainful and he is ambulating much better.  Denies fevers or chills. ? ?Physical Exam: ?Vitals:  ? 01/12/22 1653 01/12/22 2158 01/13/22 5027 01/13/22 0747  ?BP: 124/76 107/77 105/70 117/73  ?Pulse: 91 87 91 97  ?Resp: 18 18 18 18   ?Temp: 98.3 ?F (36.8 ?C) 99.2 ?F (37.3 ?C) 98.9 ?F (37.2 ?C) 98 ?F (36.7 ?C)  ?TempSrc:      ?SpO2: 100% 97% 99% 100%  ?Weight:      ?Height:      ? ?General exam: awake, alert, no acute distress ?HEENT: moist mucus membranes, hearing grossly normal  ?Respiratory system: normal respiratory  effort, on room air. ?Cardiovascular system: RRR, stable pitting edema bilateral lower extremities.   ?Gastrointestinal system: soft, NT, ND, no HSM felt, +bowel sounds. ?Central nervous system: A&O x 3. no gross focal neurologic deficits, normal speech ?Extremities: Image of right foot and distal right lower extremity below with noted stable erythema, no differential warmth to the top of the foot, small area of purulent appearing drainage medial to the scar at his ankle, stable 1-2+ pitting edema of the bilateral lower extremities ?Psychiatry: normal mood, congruent affect, judgement and insight appear normal ? ? ? ? ? ?Data Reviewed: ? ?Labs reviewed and notable for glucose 106, creatinine improved 2.42, calcium 8.6, albumin 2.4 ? ?Family Communication: None ? ?Disposition: ?Status is: Inpatient ?Remains inpatient appropriate because: Course of IV antibiotic and IV antifungal need to be completed in hospital as home health was not available.  Course completes tomorrow 3/12. ?Evaluation of persistent right foot swelling underway today ? ? Planned Discharge Destination: Home ? ? ? ?Time spent: 35 minutes ? ?Author: ?Ezekiel Slocumb, DO ?01/13/2022 11:46 AM ? ?For on call review [email or web address].  ?

## 2022-01-14 LAB — RENAL FUNCTION PANEL
Albumin: 2.5 g/dL — ABNORMAL LOW (ref 3.5–5.0)
Anion gap: 6 (ref 5–15)
BUN: 20 mg/dL (ref 8–23)
CO2: 23 mmol/L (ref 22–32)
Calcium: 8.6 mg/dL — ABNORMAL LOW (ref 8.9–10.3)
Chloride: 105 mmol/L (ref 98–111)
Creatinine, Ser: 2.53 mg/dL — ABNORMAL HIGH (ref 0.61–1.24)
GFR, Estimated: 28 mL/min — ABNORMAL LOW (ref >60)
Glucose, Bld: 103 mg/dL — ABNORMAL HIGH (ref 70–99)
Phosphorus: 3.9 mg/dL (ref 2.5–4.6)
Potassium: 3.9 mmol/L (ref 3.5–5.1)
Sodium: 134 mmol/L — ABNORMAL LOW (ref 135–145)

## 2022-01-14 LAB — MAGNESIUM: Magnesium: 1.6 mg/dL — ABNORMAL LOW (ref 1.7–2.4)

## 2022-01-14 MED ORDER — OXYBUTYNIN CHLORIDE 5 MG PO TABS: 5.0000 mg | ORAL_TABLET | Freq: Three times a day (TID) | ORAL | 0 refills | Status: DC | PRN

## 2022-01-14 MED ORDER — ADULT MULTIVITAMIN W/MINERALS CH
1.0000 | ORAL_TABLET | Freq: Every day | ORAL | Status: DC
Start: 2022-01-15 — End: 2022-03-19

## 2022-01-14 MED ORDER — MELATONIN 5 MG PO TABS
5.0000 mg | ORAL_TABLET | Freq: Every evening | ORAL | 0 refills | Status: DC | PRN
Start: 2022-01-14 — End: 2022-03-19

## 2022-01-14 MED ORDER — MAGNESIUM OXIDE -MG SUPPLEMENT 400 (240 MG) MG PO TABS: 400.0000 mg | ORAL_TABLET | Freq: Two times a day (BID) | ORAL | 2 refills | Status: DC

## 2022-01-14 MED ORDER — MAGNESIUM SULFATE 2 GM/50ML IV SOLN
2.0000 g | Freq: Once | INTRAVENOUS | Status: AC
  Administered 2022-01-14: 2 g via INTRAVENOUS
  Filled 2022-01-14: qty 50

## 2022-01-14 MED ORDER — OXYCODONE HCL 5 MG PO TABS: 5.0000 mg | ORAL_TABLET | ORAL | 0 refills | Status: DC | PRN

## 2022-01-14 MED ORDER — MIDODRINE HCL 5 MG PO TABS: 5.0000 mg | ORAL_TABLET | Freq: Three times a day (TID) | ORAL | 1 refills | Status: DC

## 2022-01-14 MED ORDER — AMIODARONE HCL 200 MG PO TABS: 200.0000 mg | ORAL_TABLET | Freq: Every day | ORAL | 1 refills | Status: DC

## 2022-01-14 NOTE — Assessment & Plan Note (Signed)
Resolved

## 2022-01-14 NOTE — Assessment & Plan Note (Signed)
Obtained by family at time of admission due to patient not caring for himself well at home.  This was likely due to his severe acute illness.  IVC has been rescinded after psychiatry evaluated. ?

## 2022-01-14 NOTE — Discharge Summary (Signed)
Physician Discharge Summary   Patient: Wayne Burns MRN: [id number] DOB: 01/29/1956  Admit date:     12/26/2021  Discharge date: 01/14/22  Discharge Physician: Wayne Slocumb   PCP: Jerene Pitch, MD   Recommendations at discharge:    Follow up with PCP in 1-2 weeks Please re-check BMP/Mg/CBC in 1-2 weeks Follow up with Urology for stone treatment and stent removal Follow up with Podiatry as scheduled or call if needed sooner Follow up with Cardiology as scheduled  Discharge Diagnoses: Principal Problem:   Septic shock ([place]) Active Problems:   Candida glabrata infection   Acute kidney injury superimposed on CKD ([place])   Atrial fibrillation with RVR ([place])   Anemia of chronic disease   Displacement of ureteral stent ([place])   COVID-19 virus infection   Chronic osteomyelitis ([place])   Hypomagnesemia   Depression   Elevated troponin   Abnormal LFTs (liver function tests)   Sepsis ([place])   Urinary tract infection   Involuntary commitment   Elevated glucose   COPD (chronic obstructive pulmonary disease) ([place])   Hypotension   E coli bacteremia   Lower extremity edema   Hyponatremia   Chronic combined systolic and diastolic CHF (congestive heart failure) (HCC)   CAD (coronary artery disease)   Localized swelling of right foot  Resolved Problems:   Essential hypertension  Hospital Course: 66 year old man with COPD and hypertension.  Brought in initially by family under IVC after being found in very poor living conditions and not taking care of himself.    The patient was found to be in septic shock with ESBL E. coli with acute cystitis and also Candida glabrata fungemia.  Patient was seen by infectious disease and will be on IV Invanz and IV Eraxis through 01/14/2022.  Unfortunately will have to stay the whole course of antibiotics here in the hospital because home health unavailable to be set up.  Patient also has acute kidney injury on superimposed on chronic kidney  disease.  The patient had displacement of right ureteral stent that was initially done at [facility].  Dr. Bernardo Heater replaced the stent on 01/02/2022 and will have to follow-up with him for stent removal.  Of note, patient recently admitted at [facility], discharged after a complicated 25-[facility] admission on 12/22/2021.  For summary of that admission, see Care Everywhere for d/c summary and or Urology Consult Note of 12/27/2021.  Patient also had A-fib with RVR, initially required IV amiodarone, now on oral amiodarone.  The patient did have COVID-19 virus infection diagnosed at [facility] on 12/12/2021, treated with remdesivir and steroids.  Assessment and Plan: * Septic shock ([place]) ESBL E. coli acute cystitis and Candida glabrata fungemia.  Patient will be on IV antibiotics through 3/12.  IV Invanz and IV Eraxis.  Candida glabrata infection Continue Eraxis through 01/14/2022.  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  After 3 doses of torsemide, Cr went up to 4.75 on 01/07/2022.  Given a fluid bolus and Cr improved to 2.76.   Recent baseline Cr at [facility] on 12/22/2021 was 2.8.  Patient does in and out catheterizations.  Creatinine today 2.50  Anemia of chronic disease Patient transfused on a hemoglobin of 7.1 on 01/03/2022.  Last Hbg 8.7, stable.  Atrial fibrillation with RVR (HCC) Continue oral amiodarone.  Cardiology holding off on anticoagulation with the patient's anemia.  Displacement of ureteral stent [facility]) Patient had right stent malposition.  Dr. Bernardo Heater did stent repositioning  on 01/02/2022. Now stable without issues. Urology follow up after discharge.  Chronic osteomyelitis ([place]) Patient has had a chronic wound that is been draining on his right ankle for years.  I am not seeing any drainage today.  He stated he was treated with a PICC line and IV antibiotics in the past.   Has persistent R pedal edema. --Wound cultures here negative  COVID-19  virus infection Previous diagnosis at [facility].  Asymptomatic.  Hypomagnesemia Continue oral magnesium.  Magnesium 1.6 today, replacing with 2 g IV Mg-sulfate.  Also on PO Mg-ox, continue.  Monitor Mg and replace PRN.  Elevated troponin Elevated troponin due to demand ischemia in the setting of sepsis.  Patient is having no chest pain and EKG nonacute.  Outpatient cardiology follow-up.   Depression Seen by psychiatry and no further recommendations.  Patient declined Zoloft.  Abnormal LFTs (liver function tests) POA, improved.  Localized swelling of right foot Patient reports swelling of the right foot for 2 weeks.  This is minimally improved.  Erythema present but not differential warmth.  Does have chronic osteomyelitis apparently involving the ankle due to prior surgeries. -- Right foot x-ray to evaluate further -- Consult podiatry if needed -- Completes antibiotics tomorrow  CAD (coronary artery disease) Chronic, stable - no active or recent chest pain.  Cardiac cath performed on 12/13/20 at [facility] for code STEMI is available in care everywhere -- it resulted with 25% distal LM disease, subtotally occluded mid-LAD at the stent level, chronically occluded mid-Lcx and occluded proximal RCA.  Intervention deferred & aggressive secondary prevention recommended. --continue ASA & Lipitor  --On amlodipine at home, on hold due to hypotension.  --Likely transition from amlodipine to ACE/ARB for GDMT as BP tolerates.  --Re-establish care with Dr. Clayborn Bigness and follow up 1-2 weeks after d/c  Chronic combined systolic and diastolic CHF (congestive heart failure) ([place]) ECHO 12/12/21 showed severely decreased, LVEF of 25-30% with thinning and akinesis involving the mid inferior and basal  inferior segment(s). Grade III diastolic dysfunction.  Normal RV size and systolic function.  BLE edema persistent but appears stable using wraps.  --Cardiology consulted, has signed off. --GDMT limited by  hypotension --Outpatient follow up with Dr. Clayborn Bigness 1-2 weeks after discharge  Hyponatremia Resolved. Sodium 136 (3/8).  Lower extremity edema Ultrasound negative for DVT.  Holding torsemide at this point with acute kidney injury  Hypotension MAP's stable. On low-dose midodrine.  COPD (chronic obstructive pulmonary disease) (HCC) No longer exacerbated, not wheezing and has good air movement on exam, on room air.  Appears not on inhalers at home. Monitor.          Consultants: Infectious disease, cardiology, urology, vascular surgery Procedures performed: Ureteral stent placement Disposition: Home Diet recommendation:  Cardiac diet DISCHARGE MEDICATION: Allergies as of 01/14/2022   No Known Allergies      Medication List     STOP taking these medications    amLODipine 10 MG tablet Commonly known as: NORVASC   cefdinir 300 MG capsule Commonly known as: OMNICEF       TAKE these medications    amiodarone 200 MG tablet Commonly known as: PACERONE Take 1 tablet (200 mg total) by mouth daily. Start taking on: January 15, 2022   atorvastatin 80 MG tablet Commonly known as: LIPITOR Take 80 mg by mouth daily.   magnesium oxide 400 (240 Mg) MG tablet Commonly known as: MAG-OX Take 1 tablet (400 mg total) by mouth 2 (two) times daily.   melatonin 5 MG  Tabs Take 1 tablet (5 mg total) by mouth at bedtime as needed.   midodrine 5 MG tablet Commonly known as: PROAMATINE Take 1 tablet (5 mg total) by mouth 3 (three) times daily with meals.   multivitamin with minerals Tabs tablet Take 1 tablet by mouth daily. Start taking on: January 15, 2022   oxybutynin 5 MG tablet Commonly known as: DITROPAN Take 1 tablet (5 mg total) by mouth every 8 (eight) hours as needed for bladder spasms.   oxyCODONE 5 MG immediate release tablet Commonly known as: Oxy IR/ROXICODONE Take 1 tablet (5 mg total) by mouth every 4 (four) hours as needed for moderate pain or severe  pain.   pantoprazole 40 MG tablet Commonly known as: PROTONIX Take 40 mg by mouth 2 (two) times daily.   sertraline 100 MG tablet Commonly known as: ZOLOFT Take 100 mg by mouth 2 (two) times daily.   Spiriva HandiHaler 18 MCG inhalation capsule Generic drug: tiotropium Place 1 capsule into inhaler and inhale daily.   tamsulosin 0.4 MG Caps capsule Commonly known as: FLOMAX Take 0.4 mg by mouth daily.   traZODone 50 MG tablet Commonly known as: DESYREL Take 100 mg by mouth at bedtime.        Follow-up Information     Yolonda Kida, MD. Go in 1 week(s).   Specialties: Cardiology, Internal Medicine Why: 1-2 weeks after discharge. Contact information: [place] [address] [phone]         Jerene Pitch, MD. Schedule an appointment as soon as possible for a visit in 1 week(s).   Specialty: Family Medicine Why: Hospital Follow up Contact information: [address] [place] [address] [phone]         Abbie Sons, MD. Schedule an appointment as soon as possible for a visit in 1 week(s).   Specialty: Urology Why: Follow up ureteral stones, stent in place, self-cath patient Contact information: [place] [address] [phone]                Discharge Exam: Danley Danker Weights   12/28/21 1500 12/31/21 1741 01/02/22 1118  Weight: 92.6 kg 91.4 kg 91.4 kg   General exam: awake, alert, no acute distress HEENT: moist mucus membranes, hearing grossly normal  Respiratory system: normal respiratory effort, on room air. Cardiovascular system: normal S1/S2, RRR Central nervous system: A&O x3. no gross focal neurologic deficits, normal speech Extremities: Stable swelling of the right dorsal aspect of the foot with stable erythema and no differential warmth, stable bilateral lower extremity edema with Ace wraps in place, normal tone Psychiatry: normal mood, congruent affect, judgement and  insight appear normal   Condition at discharge: stable  The results of significant diagnostics from this hospitalization (including imaging, microbiology, ancillary and laboratory) are listed below for reference.   Imaging Studies: CT ABDOMEN PELVIS WO CONTRAST  Addendum Date: 12/26/2021   ADDENDUM REPORT: 12/26/2021 23:44 ADDENDUM: It should be noted that the air seen within the wall of the urinary bladder may represent sequelae associated with the previously described malpositioned right-sided endo ureteral stent. Electronically Signed   By: Virgina Norfolk M.D.   On: 12/26/2021 23:44   Result Date: 12/26/2021 CLINICAL DATA:  Patient with altered mentation and recent history of COVID positive. EXAM: CT ABDOMEN AND PELVIS WITHOUT CONTRAST TECHNIQUE: Multidetector CT imaging of the abdomen and pelvis was performed following the standard protocol without IV contrast. RADIATION DOSE REDUCTION: This exam was  performed according to the departmental dose-optimization program which includes automated exposure control, adjustment of the mA and/or kV according to patient size and/or use of iterative reconstruction technique. COMPARISON:  July 14, 2019 FINDINGS: Lower chest: No acute abnormality. Hepatobiliary: No focal liver abnormality is seen. No gallstones, gallbladder wall thickening, or biliary dilatation. Pancreas: Unremarkable. No pancreatic ductal dilatation or surrounding inflammatory changes. Spleen: There is mild to moderate severity splenomegaly. Adrenals/Urinary Tract: Adrenal glands are unremarkable. Kidneys are normal in size, without focal lesions. Bilateral endo ureteral stents are noted. The proximal portion of the right-sided endo ureteral stent is seen inferior to the right renal pelvis (coronal reformatted images 46 through 50, CT series 5), while the left-sided endo ureteral stent is properly positioned. Ill-defined subcentimeter renal calculi are seen within the right renal pelvis  which is mildly dilated. Numerous subcentimeter non-obstructing renal calculi are seen within the left kidney. There is marked severity diffuse urinary bladder wall thickening with air suspected within the lateral aspect of the bladder wall on the right. A Foley catheter is also present within the bladder lumen. Stomach/Bowel: Stomach is within normal limits. Appendix appears normal. No evidence of bowel wall thickening, distention, or inflammatory changes. Vascular/Lymphatic: Aortic atherosclerosis. No enlarged abdominal or pelvic lymph nodes. Reproductive: The prostate gland is remarkable. Other: No abdominal wall hernia or abnormality. No abdominopelvic ascites. Musculoskeletal: Multilevel degenerative changes seen throughout the lumbar spine. IMPRESSION: 1. Bilateral endo ureteral stents, with a malpositioned right-sided endo ureteral stent, as described above. Surgical an urology consultation is recommended. 2. Marked severity diffuse urinary bladder wall thickening with air suspected within the lateral aspect of the bladder wall on the right which may represent sequelae associated with cystitis. The presence of an underlying neoplastic process cannot be excluded. 3. Renal calculi within the right renal pelvis with numerous subcentimeter non-obstructing left renal calculi. 4. Splenomegaly. 5. Aortic atherosclerosis. Aortic Atherosclerosis (ICD10-I70.0). Electronically Signed: By: Virgina Norfolk M.D. On: 12/26/2021 23:40   US Venous Img Lower Bilateral (DVT)  Result Date: 01/05/2022 CLINICAL DATA:  Bilateral lower extremity swelling for the past 6 days EXAM: BILATERAL LOWER EXTREMITY VENOUS DOPPLER ULTRASOUND TECHNIQUE: Gray-scale sonography with graded compression, as well as color Doppler and duplex ultrasound were performed to evaluate the lower extremity deep venous systems from the level of the common femoral vein and including the common femoral, femoral, profunda femoral, popliteal and calf veins  including the posterior tibial, peroneal and gastrocnemius veins when visible. The superficial great saphenous vein was also interrogated. Spectral Doppler was utilized to evaluate flow at rest and with distal augmentation maneuvers in the common femoral, femoral and popliteal veins. COMPARISON:  None. FINDINGS: RIGHT LOWER EXTREMITY Common Femoral Vein: No evidence of thrombus. Normal compressibility, respiratory phasicity and response to augmentation. Saphenofemoral Junction: No evidence of thrombus. Normal compressibility and flow on color Doppler imaging. Profunda Femoral Vein: No evidence of thrombus. Normal compressibility and flow on color Doppler imaging. Femoral Vein: No evidence of thrombus. Normal compressibility, respiratory phasicity and response to augmentation. Popliteal Vein: No evidence of thrombus. Normal compressibility, respiratory phasicity and response to augmentation. Calf Veins: No evidence of thrombus. Normal compressibility and flow on color Doppler imaging. Superficial Great Saphenous Vein: No evidence of thrombus. Normal compressibility. Venous Reflux:  None. Other Findings:  None. LEFT LOWER EXTREMITY Common Femoral Vein: No evidence of thrombus. Normal compressibility, respiratory phasicity and response to augmentation. Saphenofemoral Junction: No evidence of thrombus. Normal compressibility and flow on color Doppler imaging. Profunda Femoral Vein: No evidence of  thrombus. Normal compressibility and flow on color Doppler imaging. Femoral Vein: No evidence of thrombus. Normal compressibility, respiratory phasicity and response to augmentation. Popliteal Vein: No evidence of thrombus. Normal compressibility, respiratory phasicity and response to augmentation. Calf Veins: No evidence of thrombus. Normal compressibility and flow on color Doppler imaging. Superficial Great Saphenous Vein: No evidence of thrombus. Normal compressibility. Venous Reflux:  None. Other Findings:  None.  IMPRESSION: No evidence of deep venous thrombosis in either lower extremity. Electronically Signed   By: Jacqulynn Cadet M.D.   On: 01/05/2022 11:20   DG Chest Portable 1 View  Result Date: 12/26/2021 CLINICAL DATA:  New atrial fibrillation. EXAM: PORTABLE CHEST 1 VIEW COMPARISON:  Chest radiograph 10/14/2019 FINDINGS: Stable heart size allowing for differences in technique. Unchanged mediastinal contours. Aortic atherosclerosis and coronary stent. Mild hyperinflation and bronchial thickening, chronic. No acute airspace disease. No pulmonary edema. No pleural effusion or pneumothorax no acute osseous findings. IMPRESSION: 1. No acute chest findings. 2. Mild chronic hyperinflation and bronchial thickening. 3. Aortic atherosclerosis and coronary stent. Electronically Signed   By: Keith Rake M.D.   On: 12/26/2021 20:46   DG Foot Complete Right  Result Date: 01/13/2022 CLINICAL DATA:  Right foot swelling EXAM: RIGHT FOOT COMPLETE - 3+ VIEW COMPARISON:  02/08/2018 FINDINGS: Partially seen postsurgical changes from tibiotalocalcaneal arthrodesis. Bony structures appear demineralized. No evidence of acute fracture or dislocation. Degenerative changes within the foot. Marked soft tissue swelling of the dorsum of the forefoot. IMPRESSION: 1. Marked soft tissue swelling of the dorsum of the forefoot. No acute osseous abnormality. 2. Prior tibiotalocalcaneal arthrodesis. Electronically Signed   By: Davina Poke D.O.   On: 01/13/2022 13:48   DG OR UROLOGY CYSTO IMAGE ([facility] ONLY)  Result Date: 01/02/2022 There is no interpretation for this exam.  This order is for images obtained during a surgical procedure.  Please See "Surgeries" Tab for more information regarding the procedure.   ECHOCARDIOGRAM COMPLETE  Result Date: 01/01/2022    ECHOCARDIOGRAM REPORT   Patient Name:   [facility] [place] Date of Exam: 01/01/2022 Medical Rec #:  [id number]  Height:       69.0 in Accession #:    [id number] Weight:       201.6  lb Date of Birth:  November 28, 1955 BSA:          2.073 m Patient Age:    14 years   BP:           108/77 mmHg Patient Gender: M          HR:           90 bpm. Exam Location:  [facility] Procedure: 2D Echo, Color Doppler and Cardiac Doppler Indications:     Bacteremia R78.81  History:         Patient has no prior history of Echocardiogram examinations.                  COPD; Risk Factors:Hypertension.  Sonographer:     Sherrie Sport Referring Phys:  [id number] Sidney Ace Diagnosing Phys: Isaias Cowman MD  Sonographer Comments: Suboptimal apical window. IMPRESSIONS  1. Left ventricular ejection fraction, by estimation, is 60 to 65%. The left ventricle has normal function. The left ventricle has no regional wall motion abnormalities. Left ventricular diastolic parameters are indeterminate.  2. Right ventricular systolic function is normal. The right ventricular size is normal.  3. The mitral valve is normal in structure. Mild to moderate mitral valve regurgitation. No evidence of mitral  stenosis.  4. The aortic valve is normal in structure. Aortic valve regurgitation is not visualized. No aortic stenosis is present.  5. The inferior vena cava is normal in size with greater than 50% respiratory variability, suggesting right atrial pressure of 3 mmHg. Conclusion(s)/Recommendation(s): No evidence of valvular vegetations on this transthoracic echocardiogram. Consider a transesophageal echocardiogram to exclude infective endocarditis if clinically indicated. FINDINGS  Left Ventricle: Left ventricular ejection fraction, by estimation, is 60 to 65%. The left ventricle has normal function. The left ventricle has no regional wall motion abnormalities. The left ventricular internal cavity size was normal in size. There is  no left ventricular hypertrophy. Left ventricular diastolic parameters are indeterminate. Right Ventricle: The right ventricular size is normal. No increase in right ventricular wall thickness. Right ventricular  systolic function is normal. Left Atrium: Left atrial size was normal in size. Right Atrium: Right atrial size was normal in size. Pericardium: There is no evidence of pericardial effusion. Mitral Valve: The mitral valve is normal in structure. Mild to moderate mitral valve regurgitation. No evidence of mitral valve stenosis. MV peak gradient, 6.0 mmHg. The mean mitral valve gradient is 2.0 mmHg. Tricuspid Valve: The tricuspid valve is normal in structure. Tricuspid valve regurgitation is mild . No evidence of tricuspid stenosis. Aortic Valve: The aortic valve is normal in structure. Aortic valve regurgitation is not visualized. No aortic stenosis is present. Aortic valve mean gradient measures 2.5 mmHg. Aortic valve peak gradient measures 4.1 mmHg. Aortic valve area, by VTI measures 2.60 cm. Pulmonic Valve: The pulmonic valve was normal in structure. Pulmonic valve regurgitation is not visualized. No evidence of pulmonic stenosis. Aorta: The aortic root is normal in size and structure. Venous: The inferior vena cava is normal in size with greater than 50% respiratory variability, suggesting right atrial pressure of 3 mmHg. IAS/Shunts: No atrial level shunt detected by color flow Doppler.  LEFT VENTRICLE PLAX 2D LVIDd:         4.80 cm LVIDs:         3.30 cm LV PW:         1.20 cm LV IVS:        1.70 cm LVOT diam:     2.00 cm LV SV:         46 LV SV Index:   22 LVOT Area:     3.14 cm  RIGHT VENTRICLE RV S prime:     11.70 cm/s TAPSE (M-mode): 1.6 cm LEFT ATRIUM            Index        RIGHT ATRIUM           Index LA diam:      4.70 cm  2.27 cm/m   RA Area:     13.10 cm LA Vol (A2C): 61.3 ml  29.57 ml/m  RA Volume:   29.30 ml  14.13 ml/m LA Vol (A4C): 105.0 ml 50.65 ml/m  AORTIC VALVE                    PULMONIC VALVE AV Area (Vmax):    2.94 cm     PV Vmax:        0.65 m/s AV Area (Vmean):   2.87 cm     PV Vmean:       48.450 cm/s AV Area (VTI):     2.60 cm     PV VTI:         0.130 m AV Vmax:  101.00 cm/s  PV Peak grad:   1.7 mmHg AV Vmean:          73.150 cm/s  PV Mean grad:   1.0 mmHg AV VTI:            0.179 m      RVOT Peak grad: 3 mmHg AV Peak Grad:      4.1 mmHg AV Mean Grad:      2.5 mmHg LVOT Vmax:         94.60 cm/s LVOT Vmean:        66.800 cm/s LVOT VTI:          0.148 m LVOT/AV VTI ratio: 0.83  AORTA Ao Root diam: 3.33 cm MITRAL VALVE                TRICUSPID VALVE MV Area (PHT): 6.02 cm     TR Peak grad:   6.4 mmHg MV Area VTI:   1.49 cm     TR Vmax:        126.00 cm/s MV Peak grad:  6.0 mmHg MV Mean grad:  2.0 mmHg     SHUNTS MV Vmax:       1.22 m/s     Systemic VTI:  0.15 m MV Vmean:      59.4 cm/s    Systemic Diam: 2.00 cm MV Decel Time: 126 msec     Pulmonic VTI:  0.159 m MV E velocity: 131.00 cm/s Isaias Cowman MD Electronically signed by Isaias Cowman MD Signature Date/Time: 01/01/2022/1:13:17 PM    Final    ECHO TEE  Result Date: 01/02/2022    TRANSESOPHOGEAL ECHO REPORT   Patient Name:   [facility] [place] Date of Exam: 01/02/2022 Medical Rec #:  [id number]  Height:       69.0 in Accession #:    [id number] Weight:       201.6 lb Date of Birth:  October 12, 1956 BSA:          2.073 m Patient Age:    62 years   BP:           116/94 mmHg Patient Gender: M          HR:           84 bpm. Exam Location:  [facility] Procedure: Transesophageal Echo, Cardiac Doppler, Color Doppler and [place]ine            Contrast Bubble Study Indications:     Not listed on TEE check-in sheet  History:         Patient has prior history of Echocardiogram examinations, most                  recent 01/01/2022. COPD; Risk Factors:Hypertension.  Sonographer:     Sherrie Sport Referring Phys:  [id number] [place] TANG Diagnosing Phys: Serafina Royals MD PROCEDURE: The transesophogeal probe was passed without difficulty through the esophogus of the patient. Sedation performed by performing physician. The patient developed no complications during the procedure. IMPRESSIONS  1. Left ventricular ejection fraction, by estimation,  is 50 to 55%. The left ventricle has low normal function. The left ventricle has no regional wall motion abnormalities.  2. Right ventricular systolic function is normal. The right ventricular size is normal.  3. No left atrial/left atrial appendage thrombus was detected.  4. The mitral valve is normal in structure. Mild mitral valve regurgitation.  5. The aortic valve is normal in structure. Aortic valve regurgitation is trivial.  6. There is Moderate (  Grade III) plaque.  7. Agitated saline contrast bubble study was negative, with no evidence of any interatrial shunt. FINDINGS  Left Ventricle: Left ventricular ejection fraction, by estimation, is 50 to 55%. The left ventricle has low normal function. The left ventricle has no regional wall motion abnormalities. The left ventricular internal cavity size was normal in size. Right Ventricle: The right ventricular size is normal. No increase in right ventricular wall thickness. Right ventricular systolic function is normal. Left Atrium: Left atrial size was normal in size. Spontaneous echo contrast was present. No left atrial/left atrial appendage thrombus was detected. Right Atrium: Right atrial size was normal in size. Pericardium: There is no evidence of pericardial effusion. Mitral Valve: The mitral valve is normal in structure. Mild mitral valve regurgitation. There is no evidence of mitral valve vegetation. Tricuspid Valve: The tricuspid valve is normal in structure. Tricuspid valve regurgitation is mild. There is no evidence of tricuspid valve vegetation. Aortic Valve: The aortic valve is normal in structure. Aortic valve regurgitation is trivial. There is no evidence of aortic valve vegetation. Pulmonic Valve: The pulmonic valve was normal in structure. Pulmonic valve regurgitation is trivial. There is no evidence of pulmonic valve vegetation. Aorta: The aortic root and ascending aorta are structurally normal, with no evidence of dilitation. There is moderate  (Grade III) plaque. IAS/Shunts: No atrial level shunt detected by color flow Doppler. Agitated saline contrast was given intravenously to evaluate for intracardiac shunting. Agitated saline contrast bubble study was negative, with no evidence of any interatrial shunt. There  is no evidence of a patent foramen ovale. There is no evidence of an atrial septal defect. Serafina Royals MD Electronically signed by Serafina Royals MD Signature Date/Time: 01/02/2022/8:53:08 AM    Final    [place] EKG SITE RITE  Result Date: 01/03/2022 If Site Rite image not attached, placement could not be confirmed due to current cardiac rhythm.   Microbiology: Results for orders placed or performed during the hospital encounter of 12/26/21  Resp Panel by RT-PCR (Flu A&B, Covid)     Status: Abnormal   Collection Time: 12/26/21 10:33 PM   Specimen: Nasopharyngeal(NP) swabs in vial transport medium  Result Value Ref Range Status   SARS Coronavirus 2 by RT PCR POSITIVE (A) NEGATIVE Final    Comment: (NOTE) SARS-CoV-2 target nucleic acids are DETECTED.  The SARS-CoV-2 RNA is generally detectable in upper respiratory specimens during the acute phase of infection. Positive results are indicative of the presence of the identified virus, but do not rule out bacterial infection or co-infection with other pathogens not detected by the test. Clinical correlation with patient history and other diagnostic information is necessary to determine patient infection status. The expected result is Negative.  Fact Sheet for Patients: EntrepreneurPulse.com.au  Fact Sheet for Healthcare Providers: IncredibleEmployment.be  This test is not yet approved or cleared by the [place] FDA and  has been authorized for detection and/or diagnosis of SARS-CoV-2 by FDA under an Emergency Use Authorization (EUA).  This EUA will remain in effect (meaning this test can be used) for the duration of  the COVID-19  declaration under Section 564(b)(1) of the A ct, 21 U.S.C. section 360bbb-3(b)(1), unless the authorization is terminated or revoked sooner.     Influenza A by PCR NEGATIVE NEGATIVE Final   Influenza B by PCR NEGATIVE NEGATIVE Final    Comment: (NOTE) The Xpert Xpress SARS-CoV-2/FLU/RSV plus assay is intended as an aid in the diagnosis of influenza from Nasopharyngeal swab specimens and should  not be used as a sole basis for treatment. Nasal washings and aspirates are unacceptable for Xpert Xpress SARS-CoV-2/FLU/RSV testing.  Fact Sheet for Patients: EntrepreneurPulse.com.au  Fact Sheet for Healthcare Providers: IncredibleEmployment.be  This test is not yet approved or cleared by the [place] FDA and has been authorized for detection and/or diagnosis of SARS-CoV-2 by FDA under an Emergency Use Authorization (EUA). This EUA will remain in effect (meaning this test can be used) for the duration of the COVID-19 declaration under Section 564(b)(1) of the Act, 21 U.S.C. section 360bbb-3(b)(1), unless the authorization is terminated or revoked.  Performed at [facility], [address] [place], [place] [address]   Blood culture (routine x 2)     Status: None   Collection Time: 12/26/21 10:33 PM   Specimen: BLOOD  Result Value Ref Range Status   Specimen Description BLOOD LEFT ASSIST CONTROL  Final   Special Requests   Final    BOTTLES DRAWN AEROBIC AND ANAEROBIC Blood Culture adequate volume   Culture   Final    NO GROWTH 5 DAYS Performed at [facility], [address], [place], [place] [address]    Report Status 12/31/2021 FINAL  Final  Blood culture (routine x 2)     Status: Abnormal   Collection Time: 12/26/21 10:46 PM   Specimen: BLOOD  Result Value Ref Range Status   Specimen Description   Final    BLOOD RIGHT ANTECUBITAL Performed at [facility], [place] [address] [place], [place] [address]     Special Requests   Final    BOTTLES DRAWN AEROBIC AND ANAEROBIC Blood Culture adequate volume Performed at [facility], [address] [place], [place] [address]    Culture  Setup Time   Final    GRAM NEGATIVE RODS ANAEROBIC BOTTLE ONLY Organism ID to follow CRITICAL RESULT CALLED TO, READ BACK BY AND VERIFIED WITH: [facility] MITCHELL [id number] 12/27/21 HNM    Culture (A)  Final    ESCHERICHIA COLI Confirmed Extended Spectrum Beta-Lactamase Producer (ESBL).  In bloodstream infections from ESBL organisms, carbapenems are preferred over piperacillin/tazobactam. They are shown to have a lower risk of mortality.    Report Status 12/29/2021 FINAL  Final   Organism ID, Bacteria ESCHERICHIA COLI  Final      Susceptibility   Escherichia coli - MIC*    AMPICILLIN >=32 RESISTANT Resistant     CEFAZOLIN >=64 RESISTANT Resistant     CEFEPIME 16 RESISTANT Resistant     CEFTAZIDIME RESISTANT Resistant     CEFTRIAXONE >=64 RESISTANT Resistant     CIPROFLOXACIN <=0.25 SENSITIVE Sensitive     GENTAMICIN <=1 SENSITIVE Sensitive     IMIPENEM <=0.25 SENSITIVE Sensitive     TRIMETH/SULFA <=20 SENSITIVE Sensitive     AMPICILLIN/SULBACTAM 8 SENSITIVE Sensitive     PIP/TAZO <=4 SENSITIVE Sensitive     * ESCHERICHIA COLI  Blood Culture ID Panel (Reflexed)     Status: Abnormal   Collection Time: 12/26/21 10:46 PM  Result Value Ref Range Status   Enterococcus faecalis NOT DETECTED NOT DETECTED Final   Enterococcus Faecium NOT DETECTED NOT DETECTED Final   Listeria monocytogenes NOT DETECTED NOT DETECTED Final   Staphylococcus species NOT DETECTED NOT DETECTED Final   Staphylococcus aureus (BCID) NOT DETECTED NOT DETECTED Final   Staphylococcus epidermidis NOT DETECTED NOT DETECTED Final   Staphylococcus lugdunensis NOT DETECTED NOT DETECTED Final   Streptococcus species NOT DETECTED NOT DETECTED Final   Streptococcus agalactiae NOT  DETECTED NOT DETECTED Final   Streptococcus pneumoniae NOT  DETECTED NOT DETECTED Final   Streptococcus pyogenes NOT DETECTED NOT DETECTED Final   A.calcoaceticus-baumannii NOT DETECTED NOT DETECTED Final   Bacteroides fragilis NOT DETECTED NOT DETECTED Final   Enterobacterales DETECTED (A) NOT DETECTED Final    Comment: Enterobacterales represent a large order of gram negative bacteria, not a single organism. CRITICAL RESULT CALLED TO, READ BACK BY AND VERIFIED WITH: DEVAN MITCHELL PHARMD 1126 12/27/21 HNM    Enterobacter cloacae complex NOT DETECTED NOT DETECTED Final   Escherichia coli DETECTED (A) NOT DETECTED Final    Comment: CRITICAL RESULT CALLED TO, READ BACK BY AND VERIFIED WITH: DEVAN MITCHELL [id number] 12/27/21 HNM    Klebsiella aerogenes NOT DETECTED NOT DETECTED Final   Klebsiella oxytoca NOT DETECTED NOT DETECTED Final   Klebsiella pneumoniae NOT DETECTED NOT DETECTED Final   Proteus species NOT DETECTED NOT DETECTED Final   Salmonella species NOT DETECTED NOT DETECTED Final   Serratia marcescens NOT DETECTED NOT DETECTED Final   Haemophilus influenzae NOT DETECTED NOT DETECTED Final   Neisseria meningitidis NOT DETECTED NOT DETECTED Final   Pseudomonas aeruginosa NOT DETECTED NOT DETECTED Final   Stenotrophomonas maltophilia NOT DETECTED NOT DETECTED Final   Candida albicans NOT DETECTED NOT DETECTED Final   Candida auris NOT DETECTED NOT DETECTED Final   Candida glabrata NOT DETECTED NOT DETECTED Final   Candida krusei NOT DETECTED NOT DETECTED Final   Candida parapsilosis NOT DETECTED NOT DETECTED Final   Candida tropicalis NOT DETECTED NOT DETECTED Final   Cryptococcus neoformans/gattii NOT DETECTED NOT DETECTED Final   CTX-M ESBL DETECTED (A) NOT DETECTED Final    Comment: CRITICAL RESULT CALLED TO, READ BACK BY AND VERIFIED WITH: DEVAN MITCHELL PHARMD 1126 12/27/21 HNM (NOTE) Extended spectrum beta-lactamase detected. Recommend a carbapenem as initial therapy.      Carbapenem resistance IMP NOT DETECTED NOT DETECTED  Final   Carbapenem resistance KPC NOT DETECTED NOT DETECTED Final   Carbapenem resistance NDM NOT DETECTED NOT DETECTED Final   Carbapenem resist OXA 48 LIKE NOT DETECTED NOT DETECTED Final   Carbapenem resistance VIM NOT DETECTED NOT DETECTED Final    Comment: Performed at [facility], [address] [place], [place] [address]  Urine Culture     Status: Abnormal   Collection Time: 12/27/21  9:59 AM   Specimen: Urine, Clean Catch  Result Value Ref Range Status   Specimen Description   Final    URINE, CLEAN CATCH Performed at [facility], [address] [place], [place] [address]    Special Requests   Final    NONE Performed at [facility], [address], [place], [place] [address]    Culture (A)  Final    <10,000 COLONIES/mL INSIGNIFICANT GROWTH Performed at [facility] [address] [place], [place] [address]    Report Status 12/28/2021 FINAL  Final  Antifungal AST 9 Drug Panel     Status: None   Collection Time: 12/28/21  6:21 AM  Result Value Ref Range Status   Organism ID, Yeast Candida glabrata  Corrected    Comment: (NOTE) Identification performed by account, not confirmed by this laboratory. CORRECTED ON 03/02 AT 1136: PREVIOUSLY REPORTED AS Preliminary report    Amphotericin B MIC 1.0 ug/mL  Final    Comment: (NOTE) Breakpoints have been established for only some organism-drug combinations as indicated. This test was developed and its performance characteristics determined by [facility]. It has not  been cleared or approved by the Food and Drug Administration.    Anidulafungin MIC Comment  Final    Comment: (NOTE) 0.06 ug/mL Susceptible Breakpoints have been established for only some organism-drug combinations as indicated. This test was developed and its performance characteristics determined by [facility]. It has not been cleared or approved by the Food and Drug Administration.    Caspofungin MIC Comment  Final     Comment: (NOTE) 0.12 ug/mL Susceptible Breakpoints have been established for only some organism-drug combinations as indicated. This test was developed and its performance characteristics determined by [facility]. It has not been cleared or approved by the Food and Drug Administration.    Micafungin MIC Comment  Final    Comment: (NOTE) 0.016 ug/mL Susceptible Breakpoints have been established for only some organism-drug combinations as indicated. This test was developed and its performance characteristics determined by [facility]. It has not been cleared or approved by the Food and Drug Administration.    Posaconazole MIC 1.0 ug/mL  Final    Comment: (NOTE) Breakpoints have been established for only some organism-drug combinations as indicated. This test was developed and its performance characteristics determined by [facility]. It has not been cleared or approved by the Food and Drug Administration.    Fluconazole Islt MIC 16.0 ug/mL  Final    Comment: (NOTE) Susceptible Dose Dependent Breakpoints have been established for only some organism-drug combinations as indicated. This test was developed and its performance characteristics determined by [facility]. It has not been cleared or approved by the Food and Drug Administration.    Flucytosine MIC 0.06 ug/mL  Final    Comment: (NOTE) Breakpoints have been established for only some organism-drug combinations as indicated. This test was developed and its performance characteristics determined by [facility]. It has not been cleared or approved by the Food and Drug Administration.    Itraconazole MIC 1.0 ug/mL  Final    Comment: (NOTE) Breakpoints have been established for only some organism-drug combinations as indicated. This test was developed and its performance characteristics determined by [facility]. It has not been cleared or approved by the Food and Drug Administration.    Voriconazole MIC 0.25 ug/mL  Final    Comment:  (NOTE) Breakpoints have been established for only some organism-drug combinations as indicated. This test was developed and its performance characteristics determined by [facility]. It has not been cleared or approved by the Food and Drug Administration. Performed At: [facility] [address] [place], [place] [id number] Rush Farmer MD KK:[id number]    Source CANDIDA GLABRATA FROM BLOOD  Final    Comment: Performed at [facility], [place] [address], [place], [place] [address]  MRSA Next Gen by PCR, Nasal     Status: Abnormal   Collection Time: 12/28/21  3:10 PM   Specimen: Nasal Mucosa; Nasal Swab  Result Value Ref Range Status   MRSA by PCR Next Gen DETECTED (A) NOT DETECTED Final    Comment: RESULT CALLED TO, READ BACK BY AND VERIFIED WITH: GINA SHANNON 1701 12/28/21 MU (NOTE) The GeneXpert MRSA Assay (FDA approved for NASAL specimens only), is one component of a comprehensive MRSA colonization surveillance program. It is not intended to diagnose MRSA infection nor to guide or monitor treatment for MRSA infections. Test performance is not FDA approved in patients less than 82 years old. Performed at [facility], [place], [place] [address]   CULTURE, BLOOD (ROUTINE X 2) w Reflex to ID Panel     Status:  None   Collection Time: 12/29/21  6:21 AM   Specimen: BLOOD  Result Value Ref Range Status   Specimen Description BLOOD RIGHT [facility]  Final   Special Requests   Final    BOTTLES DRAWN AEROBIC AND ANAEROBIC Blood Culture adequate volume   Culture   Final    NO GROWTH 5 DAYS Performed at [facility], [address] [place], [place] [address]    Report Status 01/03/2022 FINAL  Final  CULTURE, BLOOD (ROUTINE X 2) w Reflex to ID Panel     Status: Abnormal   Collection Time: 12/29/21  6:21 AM   Specimen: BLOOD  Result Value Ref Range Status   Specimen Description   Final    BLOOD RIGHT HAND Performed at [facility], [address] [place], [place] [address]    Special Requests   Final    IN PEDIATRIC BOTTLE Blood Culture adequate volume Performed at [facility], [address] [place], [place] [address]    Culture  Setup Time   Final    YEAST IN PEDIATRIC BOTTLE Organism ID to follow CRITICAL RESULT CALLED TO, READ BACK BY AND VERIFIED WITH: BRANDON BEERS @1310  12/30/21 SCS    Culture (A)  Final    CANDIDA GLABRATA SEE SEPARATE REPORT Performed at [facility], [place] [address] [place], [place] [address]    Report Status 01/04/2022 FINAL  Final  Blood Culture ID Panel (Reflexed)     Status: Abnormal   Collection Time: 12/29/21  6:21 AM  Result Value Ref Range Status   Enterococcus faecalis NOT DETECTED NOT DETECTED Final   Enterococcus Faecium NOT DETECTED NOT DETECTED Final   Listeria monocytogenes NOT DETECTED NOT DETECTED Final   Staphylococcus species NOT DETECTED NOT DETECTED Final   Staphylococcus aureus (BCID) NOT DETECTED NOT DETECTED Final   Staphylococcus epidermidis NOT DETECTED NOT DETECTED Final   Staphylococcus lugdunensis NOT DETECTED NOT DETECTED Final   Streptococcus species NOT DETECTED NOT DETECTED Final   Streptococcus agalactiae NOT DETECTED NOT DETECTED Final   Streptococcus pneumoniae NOT DETECTED NOT DETECTED Final   Streptococcus pyogenes NOT DETECTED NOT DETECTED Final   A.calcoaceticus-baumannii NOT DETECTED NOT DETECTED Final   Bacteroides fragilis NOT DETECTED NOT DETECTED Final   Enterobacterales NOT DETECTED NOT DETECTED Final   Enterobacter cloacae complex NOT DETECTED NOT DETECTED Final   Escherichia coli NOT DETECTED NOT DETECTED Final   Klebsiella aerogenes NOT DETECTED NOT DETECTED Final   Klebsiella oxytoca NOT DETECTED NOT DETECTED Final   Klebsiella pneumoniae NOT DETECTED NOT DETECTED Final   Proteus species NOT DETECTED NOT DETECTED Final   Salmonella species NOT DETECTED NOT DETECTED Final   Serratia marcescens NOT DETECTED NOT  DETECTED Final   Haemophilus influenzae NOT DETECTED NOT DETECTED Final   Neisseria meningitidis NOT DETECTED NOT DETECTED Final   Pseudomonas aeruginosa NOT DETECTED NOT DETECTED Final   Stenotrophomonas maltophilia NOT DETECTED NOT DETECTED Final   Candida albicans NOT DETECTED NOT DETECTED Final   Candida auris NOT DETECTED NOT DETECTED Final   Candida glabrata DETECTED (A) NOT DETECTED Final    Comment: CRITICAL RESULT CALLED TO, READ BACK BY AND VERIFIED WITH: BRANDON BEERS @1310  12/30/21 SCS    Candida krusei NOT DETECTED NOT DETECTED Final   Candida parapsilosis NOT DETECTED NOT DETECTED Final   Candida tropicalis NOT DETECTED NOT DETECTED Final   Cryptococcus neoformans/gattii NOT DETECTED NOT DETECTED Final    Comment: Performed at [facility], [address] [place], [place] [address]  CULTURE, BLOOD (ROUTINE X 2) w Reflex to ID Panel     Status: None   Collection Time: 12/31/21  3:08 AM   Specimen: BLOOD  Result Value Ref Range Status   Specimen Description BLOOD BLOOD RIGHT HAND  Final   Special Requests   Final    BOTTLES DRAWN AEROBIC AND ANAEROBIC Blood Culture adequate volume   Culture   Final    NO GROWTH 5 DAYS Performed at [facility], [address] [place], [place] [address]    Report Status 01/05/2022 FINAL  Final  CULTURE, BLOOD (ROUTINE X 2) w Reflex to ID Panel     Status: None   Collection Time: 12/31/21  3:08 AM   Specimen: BLOOD  Result Value Ref Range Status   Specimen Description BLOOD BLOOD LEFT HAND  Final   Special Requests   Final    BOTTLES DRAWN AEROBIC AND ANAEROBIC Blood Culture adequate volume   Culture   Final    NO GROWTH 5 DAYS Performed at [facility], [address] [place], [place] [address]    Report Status 01/05/2022 FINAL  Final  Aerobic/Anaerobic Culture w Gram Stain (surgical/deep wound)     Status: None   Collection Time: 01/06/22  3:30 PM   Specimen: Ankle; Wound  Result Value Ref Range  Status   Specimen Description   Final    ANKLE Performed at [facility], [address] [place], [place] [address]    Special Requests   Final    Normal Performed at [facility], [address] [place], [place] [address]    Gram Stain   Final    RARE WBC PRESENT, PREDOMINANTLY MONONUCLEAR NO ORGANISMS SEEN    Culture   Final    No growth aerobically or anaerobically. Performed at [facility], [place] [address], [place], [place] [address]    Report Status 01/11/2022 FINAL  Final    Labs: CBC: Recent Labs  Lab 01/08/22 0509 01/09/22 0644 01/10/22 0620 01/11/22 0452  WBC 5.5 5.7 5.3 5.0  NEUTROABS 3.8 4.0 3.8 3.7  HGB 9.0* 9.0* 8.8* 8.7*  HCT 28.4* 28.8* 28.6* 28.1*  MCV 87.9 88.9 89.7 91.2  PLT 162 161 151 416   Basic Metabolic Panel: Recent Labs  Lab 01/10/22 0620 01/11/22 0452 01/12/22 0540 01/13/22 0421 01/14/22 0519  NA 136 139 135 135 134*  K 3.9 4.0 3.8 3.7 3.9  CL 103 104 103 103 105  CO2 26 25 24 25 23   GLUCOSE 112* 98 94 106* 103*  BUN 26* 24* 21 19 20   CREATININE 2.76* 2.51* 2.50* 2.42* 2.53*  CALCIUM 8.6* 8.9 8.7* 8.6* 8.6*  MG 2.0 1.7 1.6* 1.9 1.6*  PHOS 4.1 4.0 3.7 3.5 3.9   Liver Function Tests: Recent Labs  Lab 01/10/22 0620 01/11/22 0452 01/12/22 0540 01/13/22 0421 01/14/22 0519  ALBUMIN 2.5* 2.4* 2.7* 2.4* 2.5*   CBG: Recent Labs  Lab 01/07/22 1139 01/07/22 1640 01/07/22 2135 01/08/22 0829 01/08/22 2059  GLUCAP 138* 122* 123* 90 118*    Discharge time spent: less than 30 minutes.  Signed: Ezekiel Slocumb, DO [facility] 01/14/2022

## 2022-01-14 NOTE — Assessment & Plan Note (Signed)
Completed course of Inanz today 3/12 ?

## 2022-01-14 NOTE — Assessment & Plan Note (Signed)
Completed antibiotics as outlined ?

## 2022-01-14 NOTE — TOC Transition Note (Signed)
Transition of Care (TOC) - CM/SW Discharge Note ? ? ?Patient Details  ?Name: Wayne Burns ?MRN: [id number] ?Date of Birth: [birth year] ? ?Transition of Care (TOC) CM/SW Contact:  ?Debroah Shuttleworth E Dafney Farler, LCSW ?Phone Number: ?01/14/2022, 11:08 AM ? ? ?Clinical Narrative:   Patient has orders to DC home today.  ?Spoke to patient who says he will order himself an [place] Popper to get home. ?Patient declines HH or other TOC needs. ? ? ? ?Final next level of care: Home/Self Care ?Barriers to Discharge: Barriers Resolved ? ? ?Patient Goals and CMS Choice ?Patient states their goals for this hospitalization and ongoing recovery are:: to return home ?CMS Medicare.gov Compare Post Acute Care list provided to:: Patient ?Choice offered to / list presented to : Patient ? ?Discharge Placement ?  ?           ?  ?Patient to be transferred to facility by: uber ?Name of family member notified: patient ?Patient and family notified of of transfer: 01/14/22 ? ?Discharge Plan and Services ?  ?Discharge Planning Services: CM Consult ?           ?DME Arranged: N/A ?DME Agency: NA ?  ?  ?  ?HH Arranged: NA, Patient Refused HH ?[place] Agency: NA ?  ?  ?  ? ?Social Determinants of Health (SDOH) Interventions ?  ? ? ?Readmission Risk Interventions ?No flowsheet data found. ? ? ? ? ?

## 2022-01-25 ENCOUNTER — Ambulatory Visit: Admit: 2022-01-25 | Discharge: 2022-01-26 | Payer: MEDICARE

## 2022-01-31 ENCOUNTER — Institutional Professional Consult (permissible substitution): Admit: 2022-01-31 | Discharge: 2022-02-01 | Payer: MEDICARE | Attending: Ambulatory Care | Primary: Ambulatory Care

## 2022-01-31 MED ORDER — ROSUVASTATIN 20 MG TABLET
ORAL_TABLET | Freq: Every evening | ORAL | 3 refills | 90 days | Status: CP
Start: 2022-01-31 — End: 2023-01-31

## 2022-02-05 MED ORDER — LISINOPRIL 10 MG TABLET
ORAL_TABLET | Freq: Every day | ORAL | 2 refills | 90 days | Status: CP
Start: 2022-02-05 — End: ?

## 2022-02-14 DIAGNOSIS — N200 Calculus of kidney: Principal | ICD-10-CM

## 2022-02-14 MED ORDER — AMOXICILLIN 875 MG-POTASSIUM CLAVULANATE 125 MG TABLET
ORAL_TABLET | Freq: Two times a day (BID) | ORAL | 0 refills | 7 days | Status: CP
Start: 2022-02-14 — End: 2022-02-21

## 2022-02-22 ENCOUNTER — Ambulatory Visit: Admit: 2022-02-22 | Discharge: 2022-02-22 | Payer: MEDICARE

## 2022-02-22 ENCOUNTER — Ambulatory Visit: Admit: 2022-02-22 | Discharge: 2022-02-23 | Payer: MEDICARE

## 2022-02-22 DIAGNOSIS — Z1212 Encounter for screening for malignant neoplasm of rectum: Principal | ICD-10-CM

## 2022-02-22 DIAGNOSIS — Z1211 Encounter for screening for malignant neoplasm of colon: Principal | ICD-10-CM

## 2022-02-23 DIAGNOSIS — I21A1 Myocardial infarction type 2: Principal | ICD-10-CM

## 2022-02-27 ENCOUNTER — Ambulatory Visit: Admit: 2022-02-27 | Discharge: 2022-02-28 | Payer: MEDICARE

## 2022-02-27 DIAGNOSIS — N179 Acute kidney failure, unspecified: Principal | ICD-10-CM

## 2022-02-27 DIAGNOSIS — E785 Hyperlipidemia, unspecified: Principal | ICD-10-CM

## 2022-02-27 DIAGNOSIS — F419 Anxiety disorder, unspecified: Principal | ICD-10-CM

## 2022-02-27 MED ORDER — BUSPIRONE 5 MG TABLET
ORAL_TABLET | Freq: Two times a day (BID) | ORAL | 2 refills | 30 days | Status: CP
Start: 2022-02-27 — End: 2022-05-28

## 2022-03-07 ENCOUNTER — Ambulatory Visit (INDEPENDENT_AMBULATORY_CARE_PROVIDER_SITE_OTHER): Payer: Medicare Other | Admitting: Urology

## 2022-03-07 ENCOUNTER — Encounter: Payer: Self-pay | Admitting: Urology

## 2022-03-07 VITALS — BP 134/86 | HR 89 | Ht 69.0 in | Wt 193.0 lb

## 2022-03-07 DIAGNOSIS — N201 Calculus of ureter: Secondary | ICD-10-CM | POA: Diagnosis not present

## 2022-03-07 DIAGNOSIS — N200 Calculus of kidney: Secondary | ICD-10-CM

## 2022-03-07 LAB — URINALYSIS, COMPLETE
Bilirubin, UA: NEGATIVE
Glucose, UA: NEGATIVE
Ketones, UA: NEGATIVE
Nitrite, UA: NEGATIVE
Specific Gravity, UA: 1.015 (ref 1.005–1.030)
Urobilinogen, Ur: 0.2 mg/dL (ref 0.2–1.0)
pH, UA: 6.5 (ref 5.0–7.5)

## 2022-03-07 LAB — MICROSCOPIC EXAMINATION: WBC, UA: >30 /hpf — AB (ref 0–5)

## 2022-03-07 NOTE — Progress Notes (Incomplete)
? ?03/07/2022 ?7:57 AM  ? ?Wayne Burns ?December 07, 1955 ?[id number] ? ?Referring provider:  ?Jerene Pitch, MD ?[address] ?[place],  [place] [address] ?No chief complaint on file. ? ? ?HPI: ?Wayne Burns is a 66 y.o.male who presents today for further evaluation of kidney stones.  ? ?He has a personal history of urinary retention requiring CIC, obstructing UPJ stones, and acute kidney disease .  ? ?He has a personal history of displacement of right ureteral state initially done a [facility]. This was replaced on 01/02/2022 by Dr Bernardo Heater.  ? ?He was admitted into the hospital on 2 occassions. He was admitted on 12/12/2021 Urinalysis showed large leukocytes, nitrite positive, large blood, 178 RBCs, and >183 WBCs, many bacteria. CT abdomen and pelvis visualized Severe right hydronephrosis with 0.7 cm obstructing stone at the right distal ureter/UVJ (6:110) with associated periureteral/perinephric stranding. Moderate to severe left-sided hydronephrosis with obstructing 0.8 cm stone at the left distal ureter/UVJ (6:110) with associated periureteral/perinephric stranding.. Additional calculi within the distal left ureter measure up to 0.7 cm (6:103). Additional bilateral nonobstructing calculi, measuring up to 0.7 cm in the left lower pole and 0.9 cm in the right lower pole. Moderate right and mild left hydronephrosis, increased from prior. --Mild echogenic kidneys ? ?He had a left nephrostomy tube placed during admission on 12/13/2021.  ? ?RUS 12/19/2021 visualized Kidneys are increased in cortical echogenicity. Moderate right and mild left hydronephrosis, increased from prior ultrasound. Multiple echogenic foci are present bilaterally compatible with known renal calculi as seen on CT from 12/13/2021 ? ?He was admitted into the ED again on 12/26/2021 he presented for urinary tract infection, sepsis. He was tachycardic and was unable to give a clear HPI or past cardiac history. CMP shows sodium 131 glucose 156 creatinine 3.63 alk phos  204, albumin 2.7, ALT of 55, total bili of 2.1, GFR of 18. Urinalysis showed moderate Hgb, large leukocytes, >50 WBCs, and many bacteria.He was found to be in septic shock with ESBL E. coli with acute cystitis and also Candida glabrata fungemia. He was seen by infectious disease and was  on IV Invanz and IV Eraxis through 01/14/2022.  CT abdomen and pelvis visualized bilateral endo ureteral stents, with a malpositioned right-sided endo ureteral stent,and marked severity diffuse urinary bladder wall thickening. Renal calculi within the right renal pelvis with numerous subcentimeter non-obstructing left renal calculi.  ? ?He reports that he called clinic and booked an appointment with me instead of Dr Bernardo Heater.  ? ? ? ? ?PMH: ?Past Medical History:  ?Diagnosis Date  ? COPD (chronic obstructive pulmonary disease) ([place])   ? Hypertension   ? ? ?Surgical History: ?Past Surgical History:  ?Procedure Laterality Date  ? ANKLE SURGERY    ? CYSTOSCOPY W/ URETERAL STENT PLACEMENT Right 01/02/2022  ? Procedure: CYSTOSCOPY WITH STENT REPLACEMENT;  Surgeon: Abbie Sons, MD;  Location: [facility] ORS;  Service: Urology;  Laterality: Right;  ? TEE WITHOUT CARDIOVERSION    ? TEE WITHOUT CARDIOVERSION N/A 01/02/2022  ? Procedure: TRANSESOPHAGEAL ECHOCARDIOGRAM (TEE);  Surgeon: Corey Skains, MD;  Location: [facility] ORS;  Service: Cardiovascular;  Laterality: N/A;  ? ? ?Home Medications:  ?Allergies as of 03/07/2022   ?No Known Allergies ?  ? ?  ?Medication List  ?  ? ?  ? Accurate as of Mar 07, 2022  7:57 AM. If you have any questions, ask your nurse or doctor.  ?  ?  ? ?  ? ?amiodarone 200 MG tablet ?Commonly known as:  PACERONE ?Take 1 tablet (200 mg total) by mouth daily. ?  ?atorvastatin 80 MG tablet ?Commonly known as: LIPITOR ?Take 80 mg by mouth daily. ?  ?magnesium oxide 400 (240 Mg) MG tablet ?Commonly known as: MAG-OX ?Take 1 tablet (400 mg total) by mouth 2 (two) times daily. ?  ?melatonin 5 MG Tabs ?Take 1 tablet (5 mg total) by  mouth at bedtime as needed. ?  ?midodrine 5 MG tablet ?Commonly known as: PROAMATINE ?Take 1 tablet (5 mg total) by mouth 3 (three) times daily with meals. ?  ?multivitamin with minerals Tabs tablet ?Take 1 tablet by mouth daily. ?  ?oxybutynin 5 MG tablet ?Commonly known as: DITROPAN ?Take 1 tablet (5 mg total) by mouth every 8 (eight) hours as needed for bladder spasms. ?  ?oxyCODONE 5 MG immediate release tablet ?Commonly known as: Oxy IR/ROXICODONE ?Take 1 tablet (5 mg total) by mouth every 4 (four) hours as needed for moderate pain or severe pain. ?  ?pantoprazole 40 MG tablet ?Commonly known as: PROTONIX ?Take 40 mg by mouth 2 (two) times daily. ?  ?sertraline 100 MG tablet ?Commonly known as: ZOLOFT ?Take 100 mg by mouth 2 (two) times daily. ?  ?Spiriva HandiHaler 18 MCG inhalation capsule ?Generic drug: tiotropium ?Place 1 capsule into inhaler and inhale daily. ?  ?tamsulosin 0.4 MG Caps capsule ?Commonly known as: FLOMAX ?Take 0.4 mg by mouth daily. ?  ?traZODone 50 MG tablet ?Commonly known as: DESYREL ?Take 100 mg by mouth at bedtime. ?  ? ?  ? ? ?Allergies:  ?No Known Allergies ? ?Family History: ?No family history on file. ? ?Social History:  reports that he has quit smoking. His smoking use included cigarettes. He smoked an average of .5 packs per day. He has never used smokeless tobacco. He reports that he does not currently use alcohol. He reports that he does not currently use drugs. ? ? ?Physical Exam: ?There were no vitals taken for this visit.  ?Constitutional:  Alert and oriented, No acute distress. ?HEENT: [place] AT, moist mucus membranes.  Trachea midline, no masses. ?Cardiovascular: No clubbing, cyanosis, or edema. ?Respiratory: Normal respiratory effort, no increased work of breathing. ?Skin: No rashes, bruises or suspicious lesions. ?Neurologic: Grossly intact, no focal deficits, moving all 4 extremities. ?Psychiatric: Normal mood and affect. ? ?Laboratory Data: ? ?Lab Results  ?Component Value  Date  ? CREATININE 2.53 (H) 01/14/2022  ? ?Lab Results  ?Component Value Date  ? HGBA1C 5.9 (H) 12/28/2021  ? ? ?Urinalysis ? ? ?Pertinent Imaging: ?EXAM: ?CT ABDOMEN AND PELVIS WITHOUT CONTRAST ?  ?TECHNIQUE: ?Multidetector CT imaging of the abdomen and pelvis was performed ?following the standard protocol without IV contrast. ?  ?RADIATION DOSE REDUCTION: This exam was performed according to the ?departmental dose-optimization program which includes automated ?exposure control, adjustment of the mA and/or kV according to ?patient size and/or use of iterative reconstruction technique. ?  ?COMPARISON:  July 14, 2019 ?  ?FINDINGS: ?Lower chest: No acute abnormality. ?  ?Hepatobiliary: No focal liver abnormality is seen. No gallstones, ?gallbladder wall thickening, or biliary dilatation. ?  ?Pancreas: Unremarkable. No pancreatic ductal dilatation or ?surrounding inflammatory changes. ?  ?Spleen: There is mild to moderate severity splenomegaly. ?  ?Adrenals/Urinary Tract: Adrenal glands are unremarkable. ?  ?Kidneys are normal in size, without focal lesions. Bilateral endo ?ureteral stents are noted. The proximal portion of the right-sided ?endo ureteral stent is seen inferior to the right renal pelvis ?(coronal reformatted images 46 through 50, CT series  5), while the ?left-sided endo ureteral stent is properly positioned. Ill-defined ?subcentimeter renal calculi are seen within the right renal pelvis ?which is mildly dilated. Numerous subcentimeter non-obstructing ?renal calculi are seen within the left kidney. ?  ?There is marked severity diffuse urinary bladder wall thickening ?with air suspected within the lateral aspect of the bladder wall on ?the right. A Foley catheter is also present within the bladder ?lumen. ?  ?Stomach/Bowel: Stomach is within normal limits. Appendix appears ?normal. No evidence of bowel wall thickening, distention, or ?inflammatory changes. ?  ?Vascular/Lymphatic: Aortic  atherosclerosis. No enlarged abdominal or ?pelvic lymph nodes. ?  ?Reproductive: The prostate gland is remarkable. ?  ?Other: No abdominal wall hernia or abnormality. No abdominopelvic ?ascites. ?  ?Musculoskeletal: M

## 2022-03-08 ENCOUNTER — Other Ambulatory Visit: Payer: Self-pay | Admitting: Urology

## 2022-03-08 ENCOUNTER — Telehealth: Payer: Self-pay

## 2022-03-08 DIAGNOSIS — N201 Calculus of ureter: Secondary | ICD-10-CM

## 2022-03-08 NOTE — Progress Notes (Signed)
[facility] Posting Form  ? ?Surgery Date/Time: Date: 03/27/2022 ? ?Surgeon: Dr. John Giovanni, MD ? ?Surgery Location: Day Surgery ? ?Inpt ( No  )   Outpt (Yes)   Obs ( No  )  ? ?Diagnosis: N20.1 Bilateral Ureteral Stones ? ?-CPT: 41423 ? ?Surgery: Bilateral Ureteroscopy with laser lithotripsy and stent exchange ? ?Stop Anticoagulations: N/A ? ?Cardiac/Medical/Pulmonary Clearance needed: per Honor Loh, NP discretion  ? ?*Orders entered into EPIC  Date: 03/08/22  ? ?*Case booked in [place]  Date: 03/08/22 ? ?*Notified pt of Surgery: Date: 03/08/22 ? ?PRE-OP UA & CX: yes, obtained in office on 03/07/2022 ? ?*Placed into Prior Authorization Work Fabio Bering Date: 03/08/22 ? ? ?Assistant/laser/rep:No ? ? ? ? ? ? ? ? ? ? ? ? ? ? ? ?

## 2022-03-08 NOTE — Progress Notes (Signed)
Surgical Physician Order Form [facility] ? ?* Scheduling expectation : Next Available ? ?*Length of Case: 75 minutes ? ?*Clearance needed: Per Gaspar Bidding ? ?*Anticoagulation Instructions: N/A ? ?*Aspirin Instructions: N/A ? ?*Post-op visit Date/Instructions:  1 week cysto stent removal ? ?*Diagnosis:  Bilateral ureteral calculi ? ?*Procedure: Bilateral  Ureteroscopy w/laser lithotripsy & stent exchange (74718) ? ? ?Additional orders: N/A ? ?-Admit type: OUTpatient ? ?-Anesthesia: Choice ? ?-VTE Prophylaxis Standing Order SCD?s    ?   ?Other:  ? ?-Standing Lab Orders Per Anesthesia   ? ?Lab other: UA&Urine Culture-ordered 5/3 ? ?-Standing Test orders EKG/Chest x-ray per Anesthesia      ? ?Test other:  ? ?- Medications:  Ceftriaxone(Rocephin) 1gm IV ? ?-Other orders:  N/A ? ? ? ?  ? ?

## 2022-03-08 NOTE — Progress Notes (Signed)
? ?03/07/2022 ?7:48 AM  ? ?Wayne Burns ?[birth year] ?[id number] ? ? ?Chief Complaint  ?Patient presents with  ? Nephrolithiasis  ? ? ?HPI: ?66 y.o. male presents for follow-up visit. ? ?History of sepsis and acute renal failure secondary to bilateral obstructing ureteral calculi ?Admitted Wayne Burns [facility] 12/12/2021 with AKI secondary to bilateral obstructing distal ureteral calculi.  Creatinine was 10.  CT showed severe right hydronephrosis with a 0.7 cm obstructing stone at the right distal ureter/UVJ. Moderate to severe left hydronephrosis with a 0.8 cm obstructing stone at the left distal ureter/UVJ. Severe bilateral periureteral/perinephric stranding with trace fluid tracking along the bilateral anterior renal fascia.  Bilateral PCN placement initially recommended.  Placement of left PCN was successful however right PCN unable to be placed secondary to patient agitation.  After PCN placement he was transferred to CCU and was ultimately intubated.  Due to critical condition additional procedures were deferred until there was clinical improvement. ?Underwent cystoscopy with bilateral ureteral stent placement 12/14/2013.  There was difficulty placing the right ureteral stent secondary to J hooking of the right proximal ureter after initial stent placement there was distal migration.  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  Left ureteral stent was placed without problems and left PCN was removed.  Urology plan was definitive stone treatment within the next 3 months after cardiac clearance ?After PCN placement ECG demonstrated new ST elevation in the inferior leads.  He was thought to have a type II event related to coronary artery disease, sepsis, renal failure and respiratory failure.  He was to follow-up with cardiology after discharge to discuss complex PCI in the near future ?During his hospitalization he was also found to have melena and underwent EGD 2/16  with evidence of possible prior gastric ulcers but no evidence of recent bleeding ?Presented here late February with progressive weakness and sepsis ?Urine grew Candida glabrata ?Underwent exchange of his right ureteral stent due to proximal migration ?Discharge 01/14/2022 ?States he was scheduled for ureteroscopic stone removal at [facility] and was there 2 weeks ago however left due to a 4-hour wait.  I do not see any record of this and Care Everywhere ?He presents today to discuss definitive stone treatment ?He has a neurogenic bladder and is on intermittent catheterization ? ?PMH: ?Past Medical History:  ?Diagnosis Date  ? COPD (chronic obstructive pulmonary disease) ([place])   ? Hypertension   ? ? ?Surgical History: ?Past Surgical History:  ?Procedure Laterality Date  ? ANKLE SURGERY    ? CYSTOSCOPY W/ URETERAL STENT PLACEMENT Right 01/02/2022  ? Procedure: CYSTOSCOPY WITH STENT REPLACEMENT;  Surgeon: Abbie Sons, MD;  Location: [facility] OR[facility];  Service: Urology;  Laterality: Right;  ? TEE WITHOUT CARDIOVERSION    ? TEE WITHOUT CARDIOVERSION N/A 01/02/2022  ? Procedure: TRANSESOPHAGEAL ECHOCARDIOGRAM (TEE);  Surgeon: Corey Skains, MD;  Location: [facility] OR[facility];  Service: Cardiovascular;  Laterality: N/A;  ? ? ?Home Medications:  ?Allergies as of 03/07/2022   ? ?   Reactions  ? Ciprofloxacin Rash  ? Itchy rash that resolved with stopping cipro  ? ?  ? ?  ?Medication List  ?  ? ?  ? Accurate as of Mar 07, 2022 11:59 PM. If you have any questions, ask your nurse or doctor.  ?  ?  ? ?  ? ?STOP taking these medications   ? ?oxybutynin 5 MG tablet ?Commonly known as: DITROPAN ?Stopped by: Abbie Sons, MD ?  ? ?  ? ?  TAKE these medications   ? ?amiodarone 200 MG tablet ?Commonly known as: PACERONE ?Take 1 tablet (200 mg total) by mouth daily. ?  ?atorvastatin 80 MG tablet ?Commonly known as: LIPITOR ?Take 80 mg by mouth daily. ?  ?magnesium oxide 400 (240 Mg) MG tablet ?Commonly known as: MAG-OX ?Take 1 tablet (400 mg total) by  mouth 2 (two) times daily. ?  ?melatonin 5 MG Tabs ?Take 1 tablet (5 mg total) by mouth at bedtime as needed. ?  ?midodrine 5 MG tablet ?Commonly known as: PROAMATINE ?Take 1 tablet (5 mg total) by mouth 3 (three) times daily with meals. ?  ?multivitamin with minerals Tabs tablet ?Take 1 tablet by mouth daily. ?  ?oxyCODONE 5 MG immediate release tablet ?Commonly known as: Oxy IR/ROXICODONE ?Take 1 tablet (5 mg total) by mouth every 4 (four) hours as needed for moderate pain or severe pain. ?  ?pantoprazole 40 MG tablet ?Commonly known as: PROTONIX ?Take 40 mg by mouth 2 (two) times daily. ?  ?sertraline 100 MG tablet ?Commonly known as: ZOLOFT ?Take 100 mg by mouth 2 (two) times daily. ?  ?Spiriva HandiHaler 18 MCG inhalation capsule ?Generic drug: tiotropium ?Place 1 capsule into inhaler and inhale daily. ?  ?tamsulosin 0.4 MG Caps capsule ?Commonly known as: FLOMAX ?Take 0.4 mg by mouth daily. ?  ?traZODone 50 MG tablet ?Commonly known as: DESYREL ?Take 100 mg by mouth at bedtime. ?  ? ?  ? ? ?Allergies:  ?Allergies  ?Allergen Reactions  ? Ciprofloxacin Rash  ?  Itchy rash that resolved with stopping cipro  ? ? ?Family History: ?No family history on file. ? ?Social History:  reports that he has quit smoking. His smoking use included cigarettes. He smoked an average of .5 packs per day. He has never used smokeless tobacco. He reports that he does not currently use alcohol. He reports that he does not currently use drugs. ? ? ?Physical Exam: ?BP 134/86   Pulse 89   Ht 5\' 9"  (1.753 m)   Wt 193 lb (87.5 kg)   BMI 28.50 kg/m?   ?Constitutional:  Alert, No acute distress. ?HEENT: [place] AT, moist mucus membranes.  Trachea midline, no masses. ?Cardiovascular: RRR ?Respiratory: Normal respiratory effort, lungs clear ?GI: Abdomen is soft, nontender, nondistended, no abdominal masses ?GU: No CVA tenderness ?Skin: No rashes, bruises or suspicious lesions. ?Neurologic: Grossly intact, no focal deficits, moving all 4  extremities. ?Psychiatric: Normal mood and affect. ? ?Laboratory Data: ? ?Urinalysis ?Dipstick 1+ blood/1+ protein/3+ leukocytes ?Microscopy >30 WBC/3-10 RBC/many bacteria/no yeast ? ? ? ?Assessment & Plan:   ? ?1.  Bilateral ureteral calculi ?Bilateral obstructing ureteral calculi measuring 7 and 8 mm ?We discussed bilateral ureteroscopy with laser lithotripsy/stone removal ?The procedures were discussed in detail including potential risks of bleeding, infection/sepsis and ureteral injury.  The need for replacement of his ureteral stents temporarily was also ?All questions were answered and he desires to schedule ?Urine culture ordered today ? ? ?Abbie Sons, MD ? ?[facility] ?[address], [address] ?[place], [place] [address] ?[phone] ?

## 2022-03-08 NOTE — H&P (View-Only) (Signed)
03/07/2022 7:48 AM   Wayne Burns 1955/12/25 [id number]   Chief Complaint  Patient presents with   Nephrolithiasis    HPI: 66 y.o. male presents for follow-up visit.  History of sepsis and acute renal failure secondary to bilateral obstructing ureteral calculi Admitted [facility] 12/12/2021 with AKI secondary to bilateral obstructing distal ureteral calculi.  Creatinine was 10.  CT showed severe right hydronephrosis with a 0.7 cm obstructing stone at the right distal ureter/UVJ. Moderate to severe left hydronephrosis with a 0.8 cm obstructing stone at the left distal ureter/UVJ. Severe bilateral periureteral/perinephric stranding with trace fluid tracking along the bilateral anterior renal fascia.  Bilateral PCN placement initially recommended.  Placement of left PCN was successful however right PCN unable to be placed secondary to patient agitation.  After PCN placement he was transferred to CCU and was ultimately intubated.  Due to critical condition additional procedures were deferred until there was clinical improvement. Underwent cystoscopy with bilateral ureteral stent placement 12/14/2013.  There was difficulty placing the right ureteral stent secondary to J hooking of the right proximal ureter after initial stent placement there was distal migration.  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  Left ureteral stent was placed without problems and left PCN was removed.  Urology plan was definitive stone treatment within the next 3 months after cardiac clearance After PCN placement ECG demonstrated new ST elevation in the inferior leads.  He was thought to have a type II event related to coronary artery disease, sepsis, renal failure and respiratory failure.  He was to follow-up with cardiology after discharge to discuss complex PCI in the near future During his hospitalization he was also found to have melena and underwent EGD 2/16  with evidence of possible prior gastric ulcers but no evidence of recent bleeding Presented here late February with progressive weakness and sepsis Urine grew Candida glabrata Underwent exchange of his right ureteral stent due to proximal migration Discharge 01/14/2022 States he was scheduled for ureteroscopic stone removal at [facility] and was there 2 weeks ago however left due to a 4-hour wait.  I do not see any record of this and Care Everywhere He presents today to discuss definitive stone treatment He has a neurogenic bladder and is on intermittent catheterization  PMH: Past Medical History:  Diagnosis Date   COPD (chronic obstructive pulmonary disease) ([place])    Hypertension     Surgical History: Past Surgical History:  Procedure Laterality Date   ANKLE SURGERY     CYSTOSCOPY W/ URETERAL STENT PLACEMENT Right 01/02/2022   Procedure: CYSTOSCOPY WITH STENT REPLACEMENT;  Surgeon: Abbie Sons, MD;  Location: [facility] ORS;  Service: Urology;  Laterality: Right;   TEE WITHOUT CARDIOVERSION     TEE WITHOUT CARDIOVERSION N/A 01/02/2022   Procedure: TRANSESOPHAGEAL ECHOCARDIOGRAM (TEE);  Surgeon: Corey Skains, MD;  Location: [facility] ORS;  Service: Cardiovascular;  Laterality: N/A;    Home Medications:  Allergies as of 03/07/2022       Reactions   Ciprofloxacin Rash   Itchy rash that resolved with stopping cipro        Medication List        Accurate as of Mar 07, 2022 11:59 PM. If you have any questions, ask your nurse or doctor.          STOP taking these medications    oxybutynin 5 MG tablet Commonly known as: DITROPAN Stopped by: Abbie Sons, MD  TAKE these medications    amiodarone 200 MG tablet Commonly known as: PACERONE Take 1 tablet (200 mg total) by mouth daily.   atorvastatin 80 MG tablet Commonly known as: LIPITOR Take 80 mg by mouth daily.   magnesium oxide 400 (240 Mg) MG tablet Commonly known as: MAG-OX Take 1 tablet (400 mg total) by  mouth 2 (two) times daily.   melatonin 5 MG Tabs Take 1 tablet (5 mg total) by mouth at bedtime as needed.   midodrine 5 MG tablet Commonly known as: PROAMATINE Take 1 tablet (5 mg total) by mouth 3 (three) times daily with meals.   multivitamin with minerals Tabs tablet Take 1 tablet by mouth daily.   oxyCODONE 5 MG immediate release tablet Commonly known as: Oxy IR/ROXICODONE Take 1 tablet (5 mg total) by mouth every 4 (four) hours as needed for moderate pain or severe pain.   pantoprazole 40 MG tablet Commonly known as: PROTONIX Take 40 mg by mouth 2 (two) times daily.   sertraline 100 MG tablet Commonly known as: ZOLOFT Take 100 mg by mouth 2 (two) times daily.   Spiriva HandiHaler 18 MCG inhalation capsule Generic drug: tiotropium Place 1 capsule into inhaler and inhale daily.   tamsulosin 0.4 MG Caps capsule Commonly known as: FLOMAX Take 0.4 mg by mouth daily.   traZODone 50 MG tablet Commonly known as: DESYREL Take 100 mg by mouth at bedtime.        Allergies:  Allergies  Allergen Reactions   Ciprofloxacin Rash    Itchy rash that resolved with stopping cipro    Family History: No family history on file.  Social History:  reports that he has quit smoking. His smoking use included cigarettes. He smoked an average of .5 packs per day. He has never used smokeless tobacco. He reports that he does not currently use alcohol. He reports that he does not currently use drugs.   Physical Exam: BP 134/86   Pulse 89   Ht 5\' 9"  (1.753 m)   Wt 193 lb (87.5 kg)   BMI 28.50 kg/m   Constitutional:  Alert, No acute distress. HEENT: [place] AT, moist mucus membranes.  Trachea midline, no masses. Cardiovascular: RRR Respiratory: Normal respiratory effort, lungs clear GI: Abdomen is soft, nontender, nondistended, no abdominal masses GU: No CVA tenderness Skin: No rashes, bruises or suspicious lesions. Neurologic: Grossly intact, no focal deficits, moving all 4  extremities. Psychiatric: Normal mood and affect.  Laboratory Data:  Urinalysis Dipstick 1+ blood/1+ protein/3+ leukocytes Microscopy >30 WBC/3-10 RBC/many bacteria/no yeast    Assessment & Plan:    1.  Bilateral ureteral calculi Bilateral obstructing ureteral calculi measuring 7 and 8 mm We discussed bilateral ureteroscopy with laser lithotripsy/stone removal The procedures were discussed in detail including potential risks of bleeding, infection/sepsis and ureteral injury.  The need for replacement of his ureteral stents temporarily was also All questions were answered and he desires to schedule Urine culture ordered today   Abbie Sons, [place] [address] [place], [place] [address] [phone]

## 2022-03-08 NOTE — Telephone Encounter (Signed)
I spoke with Wayne Burns. We have discussed possible surgery dates and Tuesday May 23rd, 2023 was agreed upon by all parties. Patient given information about surgery date, what to expect pre-operatively and post operatively.  ?We discussed that a Pre-Admission Testing office will be calling to set up the pre-op visit that will take place prior to surgery, and that these appointments are typically done over the phone with a Pre-Admissions RN. Informed patient that our office will communicate any additional care to be provided after surgery.  ?Patients questions or concerns were discussed during our call. Advised to call our office should there be any additional information, questions or concerns that arise. Patient verbalized understanding.  ? ?

## 2022-03-10 ENCOUNTER — Encounter: Payer: Self-pay | Admitting: Urology

## 2022-03-12 LAB — CULTURE, URINE COMPREHENSIVE

## 2022-03-19 ENCOUNTER — Encounter
Admission: RE | Admit: 2022-03-19 | Discharge: 2022-03-19 | Disposition: A | Discharge status: Home / Self Care | Payer: Medicare Other | Source: Ambulatory Visit | Attending: Urology | Admitting: Urology

## 2022-03-19 VITALS — Ht 69.0 in | Wt 192.0 lb

## 2022-03-19 DIAGNOSIS — D638 Anemia in other chronic diseases classified elsewhere: Secondary | ICD-10-CM

## 2022-03-19 DIAGNOSIS — N179 Acute kidney failure, unspecified: Secondary | ICD-10-CM

## 2022-03-19 DIAGNOSIS — I5042 Chronic combined systolic (congestive) and diastolic (congestive) heart failure: Secondary | ICD-10-CM

## 2022-03-19 DIAGNOSIS — Z01812 Encounter for preprocedural laboratory examination: Secondary | ICD-10-CM

## 2022-03-19 HISTORY — DX: Anemia, unspecified: D64.9

## 2022-03-19 HISTORY — DX: Atherosclerotic heart disease of native coronary artery without angina pectoris: I25.10

## 2022-03-19 HISTORY — DX: Retention of urine, unspecified: R33.9

## 2022-03-19 HISTORY — DX: Depression, unspecified: F32.A

## 2022-03-19 HISTORY — DX: Unspecified viral hepatitis C without hepatic coma: B19.20

## 2022-03-19 HISTORY — DX: Chronic combined systolic (congestive) and diastolic (congestive) heart failure: I50.42

## 2022-03-19 HISTORY — DX: Osteomyelitis, unspecified: M86.9

## 2022-03-19 NOTE — Patient Instructions (Addendum)
Your procedure is scheduled on: Tuesday, May 23 ?Report to the Registration Desk on the 1st floor of the [facility]. ?To find out your arrival time, please call [phone] between 1PM - 3PM on: Monday, May 22 ?If your arrival time is 6:00 am, do not arrive prior to that time as the [facility] entrance doors do not open until 6:00 am. ? ?REMEMBER: ?Instructions that are not followed completely may result in serious medical risk, up to and including death; or upon the discretion of your surgeon and anesthesiologist your surgery may need to be rescheduled. ? ?Do not eat or drink after midnight the night before surgery.  ?No gum chewing, lozengers or hard candies. ? ?TAKE THESE MEDICATIONS THE MORNING OF SURGERY WITH A SIP OF WATER: ? ?Albuterol inhaler ?Amiodarone ?Buspirone ?Tamsulosin (Flomax) ? ?Use inhalers on the day of surgery and bring to the hospital. ? ?One week prior to surgery: starting May 16 ?Stop Anti-inflammatories (NSAIDS) such as Advil, Aleve, Ibuprofen, Motrin, Naproxen, Naprosyn and Aspirin based products such as Excedrin, Goodys Powder, BC Powder. ?Stop ANY OVER THE COUNTER supplements until after surgery. ?You may however, continue to take Tylenol if needed for pain up until the day of surgery. ? ?No Alcohol for 24 hours before or after surgery. ? ?No Smoking including e-cigarettes for 24 hours prior to surgery.  ?No chewable tobacco products for at least 6 hours prior to surgery.  ?No nicotine patches on the day of surgery. ? ?Do not use any "recreational" drugs for at least a week prior to your surgery.  ?Please be advised that the combination of cocaine and anesthesia may have negative outcomes, up to and including death. ?If you test positive for cocaine, your surgery will be cancelled. ? ?On the morning of surgery brush your teeth with toothpaste and water, you may rinse your mouth with mouthwash if you wish. ?Do not swallow any toothpaste or mouthwash. ? ?Do not wear jewelry, make-up,  hairpins, clips or nail polish. ? ?Do not wear lotions, powders, or perfumes.  ? ?Do not shave body from the neck down 48 hours prior to surgery just in case you cut yourself which could leave a site for infection.  ? ?Contact lenses, hearing aids and dentures may not be worn into surgery. ? ?Do not bring valuables to the hospital. [facility] is not responsible for any missing/lost belongings or valuables.  ? ?Notify your doctor if there is any change in your medical condition (cold, fever, infection). ? ?Wear comfortable clothing (specific to your surgery type) to the hospital. ? ?After surgery, you can help prevent lung complications by doing breathing exercises.  ?Take deep breaths and cough every 1-2 hours. Your doctor may order a device called an Incentive Spirometer to help you take deep breaths. ? ?If you are being discharged the day of surgery, you will not be allowed to drive home. ?You will need a responsible adult (18 years or older) to drive you home and stay with you that night.  ? ?If you are taking public transportation, you will need to have a responsible adult (18 years or older) with you. ?Please confirm with your physician that it is acceptable to use public transportation.  ? ?Please call the [facility] at [phone] if you have any questions about these instructions. ? ?Surgery Visitation Policy: ? ?Patients undergoing a surgery or procedure may have two family members or support persons with them as long as the person is not COVID-19 positive  or experiencing its symptoms.  ?

## 2022-03-20 ENCOUNTER — Encounter: Payer: Self-pay | Admitting: Urology

## 2022-03-20 NOTE — Progress Notes (Signed)
Call from patient; stated that [facility] called him and wanted to schedule an appointment to be seen by cardiology prior to surgery. Patient became upset and didn't understand why he needed to see the cardiologist because he was coming in Thursday to pre-admit testing to get labs and EKG. After speaking with the patient and then [facility], patient called his cardiologist at [facility] and was able to make an appointment there on Friday 03/23/22 at 1:00 pm. Clearance form faxed to [facility] Dr. Fonnie Jarvis.  ?

## 2022-03-22 ENCOUNTER — Other Ambulatory Visit: Payer: Medicare Other

## 2022-03-22 ENCOUNTER — Telehealth: Payer: Self-pay

## 2022-03-22 ENCOUNTER — Encounter: Payer: Self-pay | Admitting: Urology

## 2022-03-22 ENCOUNTER — Other Ambulatory Visit: Payer: Self-pay

## 2022-03-22 DIAGNOSIS — N201 Calculus of ureter: Secondary | ICD-10-CM

## 2022-03-22 MED ORDER — SULFAMETHOXAZOLE-TRIMETHOPRIM 800-160 MG PO TABS: 1.0000 | ORAL_TABLET | Freq: Two times a day (BID) | ORAL | 0 refills | Status: DC

## 2022-03-22 NOTE — Telephone Encounter (Signed)
Spoke with pt. Pt. Aware medication has been sent in.

## 2022-03-22 NOTE — Addendum Note (Signed)
Addended by: Gerald Leitz A on: 03/22/2022 10:39 AM   Modules accepted: Orders

## 2022-03-22 NOTE — Progress Notes (Signed)
Perioperative Services  Pre-Admission/Anesthesia Testing Clinical Review  Date: 03/26/22  Patient Demographics:  Name: Wayne Burns DOB:   06-Feb-1956 MRN:   [id number]  Planned Surgical Procedure(s):    Case: [id number] Date/Time: 03/27/22 0935   Procedure: CYSTOSCOPY/URETEROSCOPY/HOLMIUM LASER/STENT PLACEMENT (Bilateral)   Anesthesia type: Choice   Pre-op diagnosis: Bilateral Ureteral Stones   Location: [facility] OR ROOM 10 / [facility]ES[facility]   Surgeons: Abbie Sons, MD   NOTE: Available PAT nursing documentation and vital signs have been reviewed. Clinical nursing staff has updated patient's PMH/PSHx, current medication list, and drug allergies/intolerances to ensure comprehensive history available to assist in medical decision making as it pertains to the aforementioned surgical procedure and anticipated anesthetic course. Extensive review of available clinical information performed. [place] PMH and PSHx updated with any diagnoses/procedures that  may have been inadvertently omitted during his intake with the pre-admission testing department's nursing staff.  Clinical Discussion:  Wayne Burns is a 66 y.o. male who is submitted for pre-surgical anesthesia review and clearance prior to him undergoing the above procedure. Patient is a Current Smoker. Pertinent PMH includes: CAD, STEMI, atrial fibrillation, HFrEF, aortic atherosclerosis, HTN, HLD, COPD, acute respiratory failure, PUD, upper GI bleed, anemia, HCV, splenomegaly, nephrolithiasis, osteomyelitis with draining sinus (RIGHT ankle), blood cultures (+) for ESBL producing E.coli, MRSA nasal colonization, depression.   Patient admitted to [facility] on 12/12/2021 with sepsis secondary to BILATERAL obstructive uropathy. Patient with 0.7 cm stone at the RIGHT distal ureter/UPJ and a 0.9 cm stone in the RIGHT lower pole of the kidney. Contralaterally, patient with 0.8 cm and 0.7 cm stones in LEFT distal ureter/UPJ and a  0.7 cm stone in the LEFT lower pole of the kidney. BILATERAL stones noted to be causing severe associated hydronephrosis. Patient also found to have concurrent SARS-CoV-2 pneumonia and RIGHT ankle osteomyelitis. SARS-CoV-2 infection treated with 5 days of remdesivir + 10 days of dexamethasone. Patient underwent LEFT percutaneous nephrostomy tube placement with IR. Of note, a RIGHT sided tube was placed, however subsequently became dislodged due to patient movement prior to tube being secured. Additional attempts to place a tube on the RIGHT were unsuccessful. Upon return to his room from IR, patient noted to be unresponsive and hypoxic with oxygen saturations in the 80s. Patient was intubated and transferred to the medical ICU for ongoing care. Patient developed inferior ST elevation on the monitor. Code STEMI was called and patient was transferred to the cardiac catheterization lab at [facility].   Diagnostic left heart catheterization was performed on 12/13/2021 revealing multivessel CAD; 25% distal LM, subtotal mid LAD, and chronic occlusions of the mid LCx and proximal RCA.  Intervention was deferred with plans for consideration of a high risk PCI as an outpatient.  High-sensitivity troponins were trended: 3695 --> 4498 --> 5509 --> 5580 --> 4789 --> 324 ng/L.  Cardiology indicated that EKG changes and significantly elevated troponins were likely due to significant CAD in the setting of sepsis, acute hypoxic respiratory failure, and acute renal failure. Patient developed an upper GI bleed during admission. EGD revealed gastric ulcers with no stigmata. He required transfusion for symptomatic anemia. TTE on 12/13/2021 revealed new HFrEF with an EF of 25-30%. On 12/21/2021, patient went into atrial fibrillation with RVR; CHA2DS2-VASc Score = 3 (HFrEF, HTN; prior MI/aortic plaque). He was not a candidate for anticoagulation therapy due to recent gastrointestinal bleeding. Patient signed himself of [facility], on an AMA  basis, on 12/22/2021 following a 10 day admission.  Patient presented to the ED here at [facility] on 12/26/2021 under involuntary commitment (IVC).  Patient was brought in for case due to "poor living conditions, urinating/defecating on himself, and refusal to seek care". Patient to be an imminent danger to himself in that moment, thus he was placed under IVC by his family. Patient visibly ill upon arrival. He was again in rapid A.fib at a rate of 118 bpm. Foley catheter was placed yielding approximately 1.9 liters of grossly purulent urine. Urine culture grew out both Proteus mirabilis and Klebsiella pneumonia. Blood cultures grew out ESBL producing Escherichia coli. Patient was subsequently admitted to the hospital for ongoing care of his sepsis. Cardiology, nephrology, urology, and infectious disease teams were consulted. On 12/27/2021, patient developed septic shock and was placed on vasopressors. TEE on 01/02/2022 demonstrated improved in patient's EF to 50-55%. There was no evidence of valvular vegetation. Contrast bubble study was negative; no evidence of IAS. Patient was taken to the OR on 01/02/2022 for cystoscopy and stent replacement by Dr. John Giovanni, MD. Patient improved following procedure and with treatment of his sepsis. Patient was discharged on home 01/14/2022, following a 19 day admission, with plans to follow up with urology in the outpatient setting for definitive treatment of his nephrolithiasis.   As an outpatient, patient is followed by cardiology Fonnie Jarvis, MD). He was last seen in the cardiology clinic, following two recent prolonged admissions, on 03/23/2022; notes reviewed. At the time of his clinic visit, the patient denied any chest pain, shortness of breath, PND, orthopnea, palpitations, significant peripheral edema, vertiginous symptoms, or presyncope/syncope.  Patient euvolemic on exam. Blood pressure was controlled. Previous consideration was  given to performing high risk PCI due to patient's significant CAD. Given that patient was doing well overall from a cardiovascular perspective, remaining grossly symptomatic, the decision was made to defer further testing opting for aggressive medical management.   Wayne Burns is scheduled for a BILATERAL CYSTOSCOPY/URETEROSCOPY/HOLMIUM LASER/STENT PLACEMENT on 03/27/2022 with Dr. John Giovanni, MD. Given patient's past medical history significant for cardiovascular diagnoses, presurgical cardiac clearance was sought by the PAT team. Cardiology has cleared patient to proceed with low risk urological procedure at an overall ACCEPTABLE risk of significant perioperative cardiovascular complications.  In review of his medication reconciliation, it is noted the patient is on daily antiplatelet therapy. Per Dr. Dene Gentry direction, patient has been advised to continue this medication throughout his perioperative course.   Patient denies previous perioperative complications with anesthesia in the past. In review of the available records, it is noted that patient underwent a general anesthetic course here at [facility] (ASA III) in 12/2021 without documented complications.      03/19/2022    3:43 PM 03/07/2022    3:10 PM 01/14/2022    8:05 AM  Vitals with BMI  Height 5\' 9"  5\' 9"    Weight 192 lbs 193 lbs   BMI 76.19 50.93   Systolic  267 124  Diastolic  86 68  Pulse  89 95    Providers/Specialists:   NOTE: Primary physician provider listed below. Patient may have been seen by APP or partner within same practice.   PROVIDER ROLE / SPECIALTY LAST Claud Kelp, MD Urology (Surgeon) 03/08/2022  Jerene Pitch, MD Primary Care Provider 11/28/2021  Dannielle Karvonen, MD Cardiology 03/23/2022   Allergies:  Ciprofloxacin  Current Home Medications:   No current facility-administered medications for this encounter.    albuterol (VENTOLIN  HFA) 108 (90 Base)  MCG/ACT inhaler   amiodarone (PACERONE) 200 MG tablet   aspirin EC 81 MG tablet   busPIRone (BUSPAR) 5 MG tablet   lisinopril (ZESTRIL) 10 MG tablet   magnesium oxide (MAG-OX) 400 (240 Mg) MG tablet   tamsulosin (FLOMAX) 0.4 MG CAPS capsule   midodrine (PROAMATINE) 5 MG tablet   sulfamethoxazole-trimethoprim (BACTRIM DS) 800-160 MG tablet   History:   Past Medical History:  Diagnosis Date   Acute hypoxemic respiratory failure (HCC) 12/14/2021   a.) s/p PCN placement for obstructive uropathy; required intubation; had concurrent SARS-CoV-2 pneumonia   Acute kidney injury ([place]) 12/13/2021   Anemia    Aortic atherosclerosis (HCC)    Atrial fibrillation with RVR ([place]) 12/21/2021   a.) CHA2DS2-VASc = 3 (HFrEF, HTN, prior MI/aortic plaque). b.) rate/rhythm maintained on oral amiodarone; no chronic anticoagulation due to GIB.   COPD (chronic obstructive pulmonary disease) (HCC)    Coronary artery disease    a.) PCI with stents (unknown type) to LAD in 2006. b.) LHC 12/13/2021: 25% dLM, subtotal mLAD, chronic mLCx and pRCA; intervention deferred opting for med mgmt.   Hepatitis C    HFrEF (heart failure with reduced ejection fraction) ([place]) 12/13/2021   a.) TTE 12/13/2021: EF 25-30%; mid and basal inferior HK; G3DD. b.) TTE 01/01/2022: EF 60-65%; mild-mod MR. c.) TEE 01/02/2022: EF 50-55%; mod (grade III) aortic plaque; triv AR/PR, mild MR/TR; no IAS.   History of 2019 novel coronavirus disease (COVID-19) 12/12/2021   History of infection due to extended spectrum beta lactamase (ESBL) producing bacteria 12/26/2021   a.) noted on blood culture report: enterobaqcterales and E.coli detected   HLD (hyperlipidemia)    Hypertension    MDD (major depressive disorder)    MRSA nasal colonization 12/28/2021   Nephrolithiasis 12/13/2021   Osteomyelitis of right ankle ([place])    a.) unable to clear infection; (+) draining sinus; orthopedics recommending BKA   Pneumonia due to COVID-19 virus 12/2021    PUD (peptic ulcer disease)    Sepsis ([place]) 12/12/2021   a.) RIGHT ankle osteomyelitis + SARS-CoV-2 + concurrent urosepsis secondary to BILATERAL obstructive UVJ stones; urine culture (+) for Proteus mirabilis and Klebsiella pneumoniae; blood cultures (+) for ESBL producing E.coli.   Splenomegaly    ST-segment elevation myocardial infarction (STEMI) of inferior wall ([place]) 12/13/2021   a.) LHC 12/13/2021: 25% dLM, subtotal mLAD, chronically occluded mLCx and pRCA; intervention was deferred with plans for poss high risk PCI as an outpatient. b.) High-sensitivity troponins were trended: 3695 --> 4498 --> 5509 --> 5580 --> 4789 --> 324 ng/L. c.) EKG changes and significantly elevated troponins likely due to CAD in the setting of sepsis, acute hypoxic respiratory failure, and ARF   Upper GI bleed 12/19/2021   a.) (+) black tarry stools; EGD showed gastric ulcers with no stigmata of bleeding   Past Surgical History:  Procedure Laterality Date   ANKLE HARDWARE REMOVAL  08/17/2019   I&D; long term antibiotics   ANKLE SURGERY Right 2013   ORIF   CORONARY ANGIOPLASTY  2006   LAD x 2   CYSTOSCOPY W/ URETERAL STENT PLACEMENT Right 01/02/2022   Procedure: CYSTOSCOPY WITH STENT REPLACEMENT;  Surgeon: Abbie Sons, MD;  Location: [facility] ORS;  Service: Urology;  Laterality: Right;   CYSTOURETHROSCOPY  12/14/2021   LEFT HEART CATH AND CORONARY ANGIOGRAPHY Left 12/13/2021   Procedure: LEFT HEART CATH AND CORONARY ANGIOGRAPHY; Location: [facility]; Surgeon: Caren Hazy, MD   PERCUTANEOUS NEPHROSTOMY Bilateral 12/13/2021  Procedure: BILATERAL PERCUTANEOUS NEPHROSTOMY TUBE PLACEMENT - ULTRASOUND AND FLUOROSCOPIC-GUIDED; Location: [facility]   TEE WITHOUT CARDIOVERSION N/A 01/02/2022   Procedure: TRANSESOPHAGEAL ECHOCARDIOGRAM (TEE);  Surgeon: Corey Skains, MD;  Location: [facility] ORS;  Service: Cardiovascular;  Laterality: N/A;   UPPER GASTROINTESTINAL ENDOSCOPY N/A 12/21/2021   No family history on file. Social  History   Tobacco Use   Smoking status: Some Days    Packs/day: 0.50    Types: Cigarettes   Smokeless tobacco: Never  Vaping Use   Vaping Use: Never used  Substance Use Topics   Alcohol use: Not Currently   Drug use: Not Currently    Pertinent Clinical Results:  LABS: Labs reviewed: Acceptable for surgery.  Lab Results  Component Value Date   WBC 8.4 03/23/2022   HGB 10.1 (L) 03/23/2022   HCT 30.5 (L) 03/23/2022   MCV 86.7 03/23/2022   PLT 214 03/23/2022   Lab Results  Component Value Date   NA 140 03/23/2022   K 4.2 03/23/2022   CO2 21 03/23/2022   GLUCOSE 90 03/23/2022   BUN 24 03/23/2022   CREATININE 2.64 (H) 03/23/2022   CALCIUM 9.3 03/23/2022   GFRNONAA 26 (L) 03/23/2022    Component Date Value Ref Range Status   Specific Gravity, UA 03/07/2022 1.015  1.005 - 1.030 Final   pH, UA 03/07/2022 6.5  5.0 - 7.5 Final   Color, UA 03/07/2022 Yellow  Yellow Final   Appearance Ur 03/07/2022 Cloudy (A)  Clear Final   Leukocytes,UA 03/07/2022 3+ (A)  Negative Final   Protein,UA 03/07/2022 1+ (A)  Negative/Trace Final   Glucose, UA 03/07/2022 Negative  Negative Final   Ketones, UA 03/07/2022 Negative  Negative Final   RBC, UA 03/07/2022 1+ (A)  Negative Final   Bilirubin, UA 03/07/2022 Negative  Negative Final   Urobilinogen, Ur 03/07/2022 0.2  0.2 - 1.0 mg/dL Final   Nitrite, UA 03/07/2022 Negative  Negative Final   Microscopic Examination 03/07/2022 See below:   Final   Urine Culture, Comprehensive 03/07/2022 Final report (A)   Final   Organism ID, Bacteria 03/07/2022 Morganella morganii (A)   Final   Greater than 100,000 colony forming units per mL   ANTIMICROBIAL SUSCEPTIBILITY 03/07/2022 Comment   Final   Comment:       ** S = Susceptible; I = Intermediate; R = Resistant **                    P = Positive; N = Negative             MICS are expressed in micrograms per mL    Antibiotic                 RSLT#1    RSLT#2    RSLT#3    RSLT#4 Amoxicillin/Clavulanic  Acid    R Ampicillin                     R Cefazolin                      R Cefuroxime                     R Ciprofloxacin                  S Ertapenem                      S Gentamicin  S Imipenem                       S Levofloxacin                   S Meropenem                      S Nitrofurantoin                 R Piperacillin/Tazobactam        S Tetracycline                   R Tobramycin                     S Trimethoprim/Sulfa             S    WBC, UA 03/07/2022 >30 (A)  0 - 5 /hpf Final   RBC 03/07/2022 3-10 (A)  0 - 2 /hpf Final   Epithelial Cells (non renal) 03/07/2022 0-10  0 - 10 /hpf Final   Bacteria, UA 03/07/2022 Many (A)  None seen/Few Final    ECG: Date: 01/02/2022 Time ECG obtained: 1700 PM Rate: 84 bpm Rhythm:  Sinus rhythm with 1st degree AV block Axis (leads I and aVF): Normal Intervals: PR 228 ms. QRS 88 ms. QTc 472 ms. ST segment and T wave changes: No evidence of acute ST segment elevation or depression. Evidence of an age undetermined septal infarct present.  Comparison: Previous tracing on 12/29/2021 showed atrial fibrillation.    IMAGING / PROCEDURES: DIAGNOSTIC RADIOGRAPHS OF RIGHT TIBIA/FIBULA 2 VIEWS performed on 03/20/2022 Significant chronic bony remodeling of the distal tibia, fibula, and talus.  Posttraumatic and post surgical changes of the distal tibia and talus with areas of focal lucency and irregularity/nonvisualization of the tibiotalar joint.  Old healed fracture of the proximal fibula. Retained fractured screw fragment in the mid diaphysis of the tibia.  Retained drill bit region in the posterior distal tibia.  There is also a retained washer at the medial malleolus.  Antibiotic TTC nail noted.  No acute fractures or dislocation. Normal alignment. Mild diffuse soft tissue swelling about the ankle.  Given clinical history, ongoing infection cannot be excluded.   TRANSESOPHAGEAL ECHOCARDIOGRAM performed on  01/02/2022 Left ventricular ejection fraction, by estimation, is 50 to 55%. The left ventricle has low normal function. The left ventricle has no regional wall motion abnormalities.  Right ventricular systolic function is normal. The right ventricular size is normal.  No left atrial/left atrial appendage thrombus was detected.  The mitral valve is normal in structure. Mild mitral valve regurgitation.  The aortic valve is normal in structure. Aortic valve regurgitation is trivial.  There is Moderate (Grade III) plaque.  Agitated saline contrast bubble study was negative, with no evidence of any interatrial shunt.   TRANSTHORACIC ECHOCARDIOGRAM performed on 01/01/2022 Left ventricular ejection fraction, by estimation, is 60 to 65%. The left ventricle has normal function. The left ventricle has no regional wall motion abnormalities. Left ventricular diastolic parameters are indeterminate.  Right ventricular systolic function is normal. The right ventricular size is normal.  The mitral valve is normal in structure. Mild to moderate mitral valve regurgitation. No evidence of mitral stenosis.  The aortic valve is normal in structure. Aortic valve regurgitation is not visualized. No aortic stenosis is present.  The inferior vena cava is normal in size with greater than 50% respiratory variability,  suggesting right atrial pressure of 3 mmHg.  No evidence of valvular vegetations on this transthoracic echocardiogram. Consider a transesophageal echocardiogram to exclude infective endocarditis if clinically indicated.  CT ABDOMEN PELVIS WO CONTRAST performed on 12/26/2021 Bilateral endo ureteral stents, with a malpositioned right-sided endo ureteral stent, as described above. Surgical an urology consultation is recommended.  Marked severity diffuse urinary bladder wall thickening with air suspected within the lateral aspect of the bladder wall on the right which may represent sequelae associated with  cystitis. It should be noted that the air seen within the wall of the urinary bladder may represent sequelae associated with the previously described malpositioned right-sided endo ureteral stent. The presence of an underlying neoplastic process cannot be excluded. Renal calculi within the right renal pelvis with numerous subcentimeter non-obstructing left renal calculi. Mild to moderate severity splenomegaly Aortic atherosclerosis  DG CHEST PORTABLE 1 VIEW performed on 12/26/2021 Stable heart size allowing for differences in technique. Unchanged mediastinal contours.  Aortic atherosclerosis and coronary stent. Mild hyperinflation and bronchial thickening, chronic.  No acute airspace disease.  No pulmonary edema.  No pleural effusion or pneumothorax no acute osseous findings.  Impression and Plan:  Lyndall Fairburn has been referred for pre-anesthesia review and clearance prior to him undergoing the planned anesthetic and procedural courses. Available labs, pertinent testing, and imaging results were personally reviewed by me. This patient has been appropriately cleared by cardiology with an overall ACCEPTABLE risk of significant perioperative cardiovascular complications.  Based on clinical review performed today (03/26/22), barring any significant acute changes in the patient's overall condition, it is anticipated that he will be able to proceed with the planned surgical intervention. Any acute changes in clinical condition may necessitate his procedure being postponed and/or cancelled. Patient will meet with anesthesia team (MD and/or CRNA) on the day of his procedure for preoperative evaluation/assessment. Questions regarding anesthetic course will be fielded at that time.   Pre-surgical instructions were reviewed with the patient during his PAT appointment and questions were fielded by PAT clinical staff. Patient was advised that if any questions or concerns arise prior to his procedure then he  should return a call to PAT and/or his surgeon's office to discuss.  Honor Loh, MSN, APRN, FNP-C, CEN [facility]  Peri-operative Services Nurse Practitioner Phone: [phone] Fax: [phone] 03/26/22 9:51 AM  NOTE: This note has been prepared using Dragon dictation software. Despite my best ability to proofread, there is always the potential that unintentional transcriptional errors may still occur from this process.

## 2022-03-22 NOTE — Telephone Encounter (Signed)
Tried calling patient. No answer, left voicemail. Will try again.

## 2022-03-22 NOTE — Telephone Encounter (Signed)
-----   Message from Abbie Sons, MD sent at 03/21/2022  7:20 AM EDT ----- Preop urine culture positive for bacteria.  Please send Rx Septra DS 1 twice daily x7 days to start 03/22/2022.  Please change preop antibiotic to gentamicin per pharmacy

## 2022-03-23 ENCOUNTER — Ambulatory Visit: Admit: 2022-03-23 | Discharge: 2022-03-24 | Payer: MEDICARE

## 2022-03-23 DIAGNOSIS — I5021 Acute systolic (congestive) heart failure: Principal | ICD-10-CM

## 2022-03-23 DIAGNOSIS — I21A1 Myocardial infarction type 2: Principal | ICD-10-CM

## 2022-03-23 DIAGNOSIS — Z01818 Encounter for other preprocedural examination: Principal | ICD-10-CM

## 2022-03-23 MED ORDER — LISINOPRIL 10 MG TABLET
ORAL_TABLET | Freq: Every day | ORAL | 2 refills | 90.00000 days | Status: CP
Start: 2022-03-23 — End: 2022-03-23

## 2022-03-26 ENCOUNTER — Encounter: Payer: Self-pay | Admitting: Urology

## 2022-03-26 MED ORDER — CHLORHEXIDINE GLUCONATE 0.12 % MT SOLN: 15.0000 mL | Freq: Once | OROMUCOSAL | Status: AC

## 2022-03-26 MED ORDER — FAMOTIDINE 20 MG PO TABS
20.0000 mg | ORAL_TABLET | Freq: Once | ORAL | Status: AC
Start: 2022-03-26 — End: 2022-03-27

## 2022-03-26 MED ORDER — ACETAMINOPHEN 500 MG PO TABS: 1000.0000 mg | ORAL_TABLET | Freq: Once | ORAL | Status: AC

## 2022-03-26 MED ORDER — ORAL CARE MOUTH RINSE: 15.0000 mL | Freq: Once | OROMUCOSAL | Status: AC

## 2022-03-26 MED ORDER — SODIUM CHLORIDE 0.9 % IV SOLN: INTRAVENOUS | Status: DC

## 2022-03-26 MED ORDER — GENTAMICIN SULFATE 40 MG/ML IJ SOLN
5.0000 mg/kg | Freq: Once | INTRAVENOUS | Status: AC
  Administered 2022-03-27: 440 mg via INTRAVENOUS
  Filled 2022-03-26: qty 11

## 2022-03-26 NOTE — Anesthesia Preprocedure Evaluation (Addendum)
Anesthesia Evaluation  Patient identified by MRN, date of birth, ID band Patient awake    Reviewed: Allergy & Precautions, H&P , NPO status , Patient's Chart, lab work & pertinent test results, reviewed documented beta blocker date and time   History of Anesthesia Complications Negative for: history of anesthetic complications  Airway Mallampati: II  TM Distance: >3 FB Neck ROM: full    Dental  (+) Dental Advidsory Given, Poor Dentition, Missing   Pulmonary neg shortness of breath, asthma , COPD,  COPD inhaler, neg recent URI, Current Smoker and Patient abstained from smoking., former smoker,  Hypoxemic respiratory failure 2/23: Intubation, concurrent Covid infection   Pulmonary exam normal breath sounds clear to auscultation       Cardiovascular Exercise Tolerance: Good hypertension, (-) angina+ CAD, + Past MI, + Cardiac Stents and +CHF  + dysrhythmias (rate/rhythm maintained on oral amiodarone; no chronic anticoagulation due to GIB) Atrial Fibrillation (-) Valvular Problems/Murmurs Rhythm:regular Rate:Normal   LHC 12/13/2021: 25% dLM, subtotal mLAD, chronic mLCx and pRCA; intervention deferred opting for med mgmt.  2/23: Sepsis: High-sensitivity troponins were trended: 3695 --> 4498 --> 5509 --> 5580 --> 4789 --> 324 ng/L. c.) EKG changes and significantly elevated troponins likely due to CAD in the setting of sepsis, acute hypoxic respiratory failure, and ARF   TEE 01/02/2022: EF 50-55%; mod (grade III) aortic plaque; triv AR/PR, mild MR/TR; no IAS.   Neuro/Psych PSYCHIATRIC DISORDERS Depression negative neurological ROS     GI/Hepatic GERD  ,(+) Hepatitis - (s/p treatment), CUGI 2/23: (+) black tarry stools; EGD showed gastric ulcers with no stigmata of bleeding   Endo/Other  negative endocrine ROS  Renal/GU Renal disease (kidney stone)  negative genitourinary   Musculoskeletal   Abdominal Normal abdominal exam   (+)   Peds  Hematology  (+) Blood dyscrasia, anemia ,   Anesthesia Other Findings PAT note from Honor Loh reviewed   Reproductive/Obstetrics negative OB ROS                            Anesthesia Physical  Anesthesia Plan  ASA: 3  Anesthesia Plan: General   Post-op Pain Management:    Induction: Intravenous  PONV Risk Score and Plan: 2 and Treatment may vary due to age or medical condition, Ondansetron, Dexamethasone and Midazolam  Airway Management Planned: LMA  Additional Equipment:   Intra-op Plan:   Post-operative Plan: Extubation in OR  Informed Consent:     Dental Advisory Given  Plan Discussed with: Anesthesiologist, CRNA and Surgeon  Anesthesia Plan Comments:        Anesthesia Quick Evaluation

## 2022-03-27 ENCOUNTER — Ambulatory Visit: Payer: Medicare Other

## 2022-03-27 ENCOUNTER — Encounter
Admission: RE | Disposition: A | Discharge status: Home / Self Care | Payer: Self-pay | Source: Ambulatory Visit | Attending: Urology

## 2022-03-27 ENCOUNTER — Encounter: Payer: Self-pay | Admitting: Urology

## 2022-03-27 ENCOUNTER — Other Ambulatory Visit: Payer: Self-pay

## 2022-03-27 ENCOUNTER — Ambulatory Visit
Admission: RE | Admit: 2022-03-27 | Discharge: 2022-03-27 | Disposition: A | Discharge status: Home / Self Care | Payer: Medicare Other | Source: Ambulatory Visit | Attending: Urology | Admitting: Urology

## 2022-03-27 ENCOUNTER — Ambulatory Visit: Payer: Medicare Other | Admitting: Urgent Care

## 2022-03-27 DIAGNOSIS — Z8711 Personal history of peptic ulcer disease: Secondary | ICD-10-CM | POA: Diagnosis not present

## 2022-03-27 DIAGNOSIS — I509 Heart failure, unspecified: Secondary | ICD-10-CM | POA: Diagnosis not present

## 2022-03-27 DIAGNOSIS — I4891 Unspecified atrial fibrillation: Secondary | ICD-10-CM | POA: Diagnosis not present

## 2022-03-27 DIAGNOSIS — N202 Calculus of kidney with calculus of ureter: Secondary | ICD-10-CM | POA: Diagnosis present

## 2022-03-27 DIAGNOSIS — Z8616 Personal history of COVID-19: Secondary | ICD-10-CM | POA: Insufficient documentation

## 2022-03-27 DIAGNOSIS — F32A Depression, unspecified: Secondary | ICD-10-CM | POA: Diagnosis not present

## 2022-03-27 DIAGNOSIS — J449 Chronic obstructive pulmonary disease, unspecified: Secondary | ICD-10-CM | POA: Diagnosis not present

## 2022-03-27 DIAGNOSIS — Z87440 Personal history of urinary (tract) infections: Secondary | ICD-10-CM | POA: Insufficient documentation

## 2022-03-27 DIAGNOSIS — N201 Calculus of ureter: Secondary | ICD-10-CM | POA: Diagnosis not present

## 2022-03-27 DIAGNOSIS — E785 Hyperlipidemia, unspecified: Secondary | ICD-10-CM | POA: Insufficient documentation

## 2022-03-27 DIAGNOSIS — I252 Old myocardial infarction: Secondary | ICD-10-CM | POA: Insufficient documentation

## 2022-03-27 DIAGNOSIS — I2510 Atherosclerotic heart disease of native coronary artery without angina pectoris: Secondary | ICD-10-CM | POA: Insufficient documentation

## 2022-03-27 DIAGNOSIS — I5042 Chronic combined systolic (congestive) and diastolic (congestive) heart failure: Secondary | ICD-10-CM

## 2022-03-27 DIAGNOSIS — I110 Hypertensive heart disease with heart failure: Secondary | ICD-10-CM | POA: Diagnosis not present

## 2022-03-27 DIAGNOSIS — N189 Chronic kidney disease, unspecified: Secondary | ICD-10-CM

## 2022-03-27 DIAGNOSIS — F17200 Nicotine dependence, unspecified, uncomplicated: Secondary | ICD-10-CM | POA: Diagnosis not present

## 2022-03-27 DIAGNOSIS — Z01812 Encounter for preprocedural laboratory examination: Secondary | ICD-10-CM

## 2022-03-27 DIAGNOSIS — K219 Gastro-esophageal reflux disease without esophagitis: Secondary | ICD-10-CM | POA: Diagnosis not present

## 2022-03-27 DIAGNOSIS — D638 Anemia in other chronic diseases classified elsewhere: Secondary | ICD-10-CM

## 2022-03-27 HISTORY — DX: Hyperlipidemia, unspecified: E78.5

## 2022-03-27 HISTORY — DX: Osteomyelitis, unspecified: M86.9

## 2022-03-27 HISTORY — DX: Peptic ulcer, site unspecified, unspecified as acute or chronic, without hemorrhage or perforation: K27.9

## 2022-03-27 HISTORY — DX: Atherosclerosis of aorta: I70.0

## 2022-03-27 HISTORY — PX: CYSTOSCOPY/URETEROSCOPY/HOLMIUM LASER/STENT PLACEMENT: SHX6546

## 2022-03-27 HISTORY — DX: Major depressive disorder, single episode, unspecified: F32.9

## 2022-03-27 HISTORY — DX: Splenomegaly, not elsewhere classified: R16.1

## 2022-03-27 SURGERY — CYSTOSCOPY/URETEROSCOPY/HOLMIUM LASER/STENT PLACEMENT
Anesthesia: General | Laterality: Bilateral

## 2022-03-27 MED ORDER — DEXAMETHASONE SODIUM PHOSPHATE 10 MG/ML IJ SOLN
INTRAMUSCULAR | Status: DC | PRN
  Administered 2022-03-27: 10 mg via INTRAVENOUS

## 2022-03-27 MED ORDER — FAMOTIDINE 20 MG PO TABS
ORAL_TABLET | ORAL | Status: AC
  Administered 2022-03-27: 20 mg via ORAL
  Filled 2022-03-27: qty 1

## 2022-03-27 MED ORDER — LIDOCAINE HCL (CARDIAC) PF 100 MG/5ML IV SOSY
PREFILLED_SYRINGE | INTRAVENOUS | Status: DC | PRN
  Administered 2022-03-27: 40 mg via INTRAVENOUS
  Administered 2022-03-27: 60 mg via INTRAVENOUS

## 2022-03-27 MED ORDER — EPHEDRINE 5 MG/ML INJ
INTRAVENOUS | Status: AC
  Filled 2022-03-27: qty 5

## 2022-03-27 MED ORDER — PROPOFOL 10 MG/ML IV BOLUS
INTRAVENOUS | Status: AC
  Filled 2022-03-27: qty 20

## 2022-03-27 MED ORDER — PHENYLEPHRINE HCL-NACL 20-0.9 MG/250ML-% IV SOLN
INTRAVENOUS | Status: AC
  Filled 2022-03-27: qty 250

## 2022-03-27 MED ORDER — FENTANYL CITRATE (PF) 100 MCG/2ML IJ SOLN
INTRAMUSCULAR | Status: AC
  Filled 2022-03-27: qty 2

## 2022-03-27 MED ORDER — SODIUM CHLORIDE 0.9 % IR SOLN
Status: DC | PRN
Start: 2022-03-27 — End: 2022-03-27
  Administered 2022-03-27: 3300 mL

## 2022-03-27 MED ORDER — DROPERIDOL 2.5 MG/ML IJ SOLN: 0.6250 mg | Freq: Once | INTRAMUSCULAR | Status: DC | PRN

## 2022-03-27 MED ORDER — OXYCODONE HCL 5 MG/5ML PO SOLN: 5.0000 mg | Freq: Once | ORAL | Status: DC | PRN

## 2022-03-27 MED ORDER — EPHEDRINE SULFATE (PRESSORS) 50 MG/ML IJ SOLN
INTRAMUSCULAR | Status: DC | PRN
  Administered 2022-03-27: 10 mg via INTRAVENOUS

## 2022-03-27 MED ORDER — OXYCODONE HCL 5 MG PO TABS: 5.0000 mg | ORAL_TABLET | Freq: Once | ORAL | Status: DC | PRN

## 2022-03-27 MED ORDER — OXYBUTYNIN CHLORIDE 5 MG PO TABS: ORAL_TABLET | ORAL | 0 refills | Status: DC

## 2022-03-27 MED ORDER — SEVOFLURANE IN SOLN
RESPIRATORY_TRACT | Status: AC
  Filled 2022-03-27: qty 250

## 2022-03-27 MED ORDER — GLYCOPYRROLATE 0.2 MG/ML IJ SOLN
INTRAMUSCULAR | Status: DC | PRN
  Administered 2022-03-27: .2 mg via INTRAVENOUS

## 2022-03-27 MED ORDER — ONDANSETRON HCL 4 MG/2ML IJ SOLN
INTRAMUSCULAR | Status: DC | PRN
  Administered 2022-03-27: 4 mg via INTRAVENOUS

## 2022-03-27 MED ORDER — LIDOCAINE HCL (PF) 2 % IJ SOLN
INTRAMUSCULAR | Status: AC
  Filled 2022-03-27: qty 15

## 2022-03-27 MED ORDER — IPRATROPIUM-ALBUTEROL 0.5-2.5 (3) MG/3ML IN SOLN
RESPIRATORY_TRACT | Status: AC
  Filled 2022-03-27: qty 3

## 2022-03-27 MED ORDER — GLYCOPYRROLATE 0.2 MG/ML IJ SOLN
INTRAMUSCULAR | Status: AC
  Filled 2022-03-27: qty 2

## 2022-03-27 MED ORDER — IPRATROPIUM-ALBUTEROL 0.5-2.5 (3) MG/3ML IN SOLN
3.0000 mL | Freq: Once | RESPIRATORY_TRACT | Status: AC
Start: 2022-03-27 — End: 2022-03-27
  Administered 2022-03-27: 3 mL via RESPIRATORY_TRACT

## 2022-03-27 MED ORDER — PROMETHAZINE HCL 25 MG/ML IJ SOLN: 6.2500 mg | INTRAMUSCULAR | Status: DC | PRN

## 2022-03-27 MED ORDER — PHENYLEPHRINE HCL (PRESSORS) 10 MG/ML IV SOLN
INTRAVENOUS | Status: DC | PRN
Start: 2022-03-27 — End: 2022-03-27
  Administered 2022-03-27: 80 ug via INTRAVENOUS
  Administered 2022-03-27 (×3): 160 ug via INTRAVENOUS
  Administered 2022-03-27: 80 ug via INTRAVENOUS
  Administered 2022-03-27: 160 ug via INTRAVENOUS

## 2022-03-27 MED ORDER — FENTANYL CITRATE (PF) 100 MCG/2ML IJ SOLN: 25.0000 ug | INTRAMUSCULAR | Status: DC | PRN

## 2022-03-27 MED ORDER — DEXAMETHASONE SODIUM PHOSPHATE 10 MG/ML IJ SOLN
INTRAMUSCULAR | Status: AC
  Filled 2022-03-27: qty 3

## 2022-03-27 MED ORDER — FENTANYL CITRATE (PF) 100 MCG/2ML IJ SOLN
INTRAMUSCULAR | Status: DC | PRN
  Administered 2022-03-27 (×2): 25 ug via INTRAVENOUS
  Administered 2022-03-27: 50 ug via INTRAVENOUS

## 2022-03-27 MED ORDER — ACETAMINOPHEN 500 MG PO TABS
ORAL_TABLET | ORAL | Status: AC
  Administered 2022-03-27: 1000 mg via ORAL
  Filled 2022-03-27: qty 2

## 2022-03-27 MED ORDER — ONDANSETRON HCL 4 MG/2ML IJ SOLN
INTRAMUSCULAR | Status: AC
  Filled 2022-03-27: qty 4

## 2022-03-27 MED ORDER — IOHEXOL 180 MG/ML  SOLN
INTRAMUSCULAR | Status: DC | PRN
  Administered 2022-03-27: 40 mL

## 2022-03-27 MED ORDER — PROPOFOL 500 MG/50ML IV EMUL
INTRAVENOUS | Status: AC
  Filled 2022-03-27: qty 50

## 2022-03-27 MED ORDER — CHLORHEXIDINE GLUCONATE 0.12 % MT SOLN
OROMUCOSAL | Status: AC
  Administered 2022-03-27: 15 mL via OROMUCOSAL
  Filled 2022-03-27: qty 15

## 2022-03-27 SURGICAL SUPPLY — 22 items
BAG DRAIN CYSTO-URO LG1000N (MISCELLANEOUS) ×2 IMPLANT
BASKET ZERO TIP 1.9FR (BASKET) ×1 IMPLANT
BRUSH SCRUB EZ 1% IODOPHOR (MISCELLANEOUS) ×2 IMPLANT
BSKT STON RTRVL ZERO TP 1.9FR (BASKET) ×1
CATH URET FLEX-TIP 2 LUMEN 10F (CATHETERS) ×1 IMPLANT
DRAPE UTILITY 15X26 TOWEL STRL (DRAPES) ×2 IMPLANT
EXTRACTOR STONE TIPLESS (MISCELLANEOUS) IMPLANT
FIBER LASER MOSES 200 DFL (Laser) ×1 IMPLANT
GLOVE SURG UNDER POLY LF SZ7.5 (GLOVE) ×2 IMPLANT
GOWN STRL REUS W/ TWL LRG LVL3 (GOWN DISPOSABLE) ×1 IMPLANT
GOWN STRL REUS W/ TWL XL LVL3 (GOWN DISPOSABLE) ×1 IMPLANT
GOWN STRL REUS W/TWL LRG LVL3 (GOWN DISPOSABLE) ×2
GOWN STRL REUS W/TWL XL LVL3 (GOWN DISPOSABLE) ×2
GUIDEWIRE STR DUAL SENSOR (WIRE) ×3 IMPLANT
IV NS IRRIG 3000ML ARTHROMATIC (IV SOLUTION) ×2 IMPLANT
KIT TURNOVER CYSTO (KITS) ×2 IMPLANT
PACK CYSTO AR (MISCELLANEOUS) ×2 IMPLANT
SET CYSTO W/LG BORE CLAMP LF (SET/KITS/TRAYS/PACK) ×2 IMPLANT
STENT URET 6FRX24 CONTOUR (STENTS) ×1 IMPLANT
SURGILUBE 2OZ TUBE FLIPTOP (MISCELLANEOUS) ×2 IMPLANT
VALVE UROSEAL ADJ ENDO (VALVE) ×1 IMPLANT
WATER STERILE IRR 500ML POUR (IV SOLUTION) ×2 IMPLANT

## 2022-03-27 NOTE — Transfer of Care (Signed)
Immediate Anesthesia Transfer of Care Note  Patient: Wayne Burns  Procedure(s) Performed: CYSTOSCOPY/URETEROSCOPY/HOLMIUM LASER/STENT PLACEMENT (Bilateral)  Patient Location: PACU  Anesthesia Type:General  Level of Consciousness: drowsy and patient cooperative  Airway & Oxygen Therapy: Patient Spontanous Breathing and Patient connected to face mask oxygen  Post-op Assessment: Report given to RN and Post -op Vital signs reviewed and stable  Post vital signs: Reviewed and stable  Last Vitals:  Vitals Value Taken Time  BP 113/80 03/27/22 1047  Temp 36.3 C 03/27/22 1043  Pulse 87 03/27/22 1047  Resp 18 03/27/22 1047  SpO2 99 % 03/27/22 1047  Vitals shown include unvalidated device data.  Last Pain:  Vitals:   03/27/22 0813  TempSrc: Temporal  PainSc: 0-No pain         Complications: No notable events documented.

## 2022-03-27 NOTE — Anesthesia Procedure Notes (Signed)
Procedure Name: LMA Insertion Date/Time: 03/27/2022 8:48 AM Performed by: Jonna Clark, CRNA Pre-anesthesia Checklist: Patient identified, Patient being monitored, Timeout performed, Emergency Drugs available and Suction available Patient Re-evaluated:Patient Re-evaluated prior to induction Oxygen Delivery Method: Circle system utilized Preoxygenation: Pre-oxygenation with 100% oxygen Induction Type: IV induction Ventilation: Mask ventilation without difficulty LMA: LMA inserted LMA Size: 5.0 Tube type: Oral Number of attempts: 1 Placement Confirmation: positive ETCO2 and breath sounds checked- equal and bilateral Tube secured with: Tape Dental Injury: Teeth and Oropharynx as per pre-operative assessment

## 2022-03-27 NOTE — Op Note (Signed)
Preoperative diagnosis:  Bilateral ureteral calculi Left nephrolithiasis  Postoperative diagnosis:  Left ureteral calculi Left nephrolithiasis  Procedure:  Cystoscopy Left ureteroscopy and stone removal Ureteroscopic laser lithotripsy Right ureteroscopy Bilateral ureteral stent exchange (25F/24 cm) Bilateral retrograde pyelography with interpretation Intraoperative fluoroscopy <60 minutes  Surgeon: Nicki Reaper C. Jaran Sainz, M.D.  Anesthesia: General  Complications: None  Intraoperative findings: Cystoscopy-wide caliber bulbar urethral stricture.  Prostate with mild lateral lobe enlargement and moderate bladder neck elevation inflammatory changes trigone secondary to indwelling stents.  No solid or papillary lesions. Left ureteroscopy-8 mm distal calculus.  3 additional calculi just proximal to the 8 mm stone measuring ~ 3 mm, 3 mm and 5 mm Left pyeloscopy-upper and lower calyceal calculi measuring ~ 5 mm which were dusted with holmium laser.  A lower pole calculus was unable to be treated due to inability to adequately flex the ureteroscope Right ureteroscopy-distal calculus was not present.  The ureteroscope was advanced to the proximal ureter Left retrograde pyelography post procedure showed no filling defects, stone fragments or contrast extravasation.  Moderate hydronephrosis felt to be chronic Right retrograde pyelogram-mild to moderate with partial ureteral duplication.  No filling defects were noted.  EBL: Minimal  Specimens: Calculus fragments for analysis   Indication: Wayne Burns is a 66 y.o. with a history of bilateral obstructing distal ureteral calculi and AKI with a creatinine of 10 in early February 2023.  Initially seen at [facility] and bilateral ureteral stents placed.  Right ureteral stent was replaced 01/02/2022 secondary to distal migration.  Stone was not treated at that time secondary to UTI.  After reviewing the management options for treatment, the patient elected to  proceed with the above surgical procedure(s). We have discussed the potential benefits and risks of the procedure, side effects of the proposed treatment, the likelihood of the patient achieving the goals of the procedure, and any potential problems that might occur during the procedure or recuperation. Informed consent has been obtained.  Description of procedure:  The patient was taken to the operating room and general anesthesia was induced.  The patient was placed in the dorsal lithotomy position, prepped and draped in the usual sterile fashion, and preoperative antibiotics were administered. A preoperative time-out was performed.   A 21 French cystoscope was lubricated however would not advance through the urethral meatus.  The distal urethra was dilated with Leander Rams sounds to 69 [place].  The cystoscope was then easily placed and advanced proximally under direct vision with findings as described above.   The left ureteral stent was grasped with endoscopic forceps and brought out to the urethral meatus.  A 0.038 Sensor wire was placed through the stent and into the renal pelvis under fluoroscopic guidance with subsequent removal of the stent.  A 4.5 Fr semirigid ureteroscope was then advanced into the ureter next to the guidewire with findings as described above  Each calculus was dusted with a 200 m holmium laser fiber at a setting of 0.3J/80 Hz.  Several fragments larger than 1 mm that were chipped off the stone were removed with a 1.9 French 0 tip nitinol basket followed by a N-Compass basket until there were no significantly sized stone fragments identified.  The semirigid ureteroscope was removed and a single channel digital flexible ureteroscope was passed per urethra and advanced into the left ureter alongside the guidewire.  The ureteroscope was advanced into the renal pelvis without difficulty.  Pyeloscopy was performed with findings as described above.  The calculi were dusted at a  setting of 0.3 J / 80 Hz as described above.  Retrograde pyelograms performed through the ureteroscope at the completion of the procedure with findings as described above.  The ureteroscope was removed under direct vision and no significant size ureteral stone fragments were identified.  A 22F/24 cm Contour ureteral stent was placed under fluoroscopic guidance.  The wire was then removed with an adequate stent curl noted in the renal pelvis as well as in the bladder.  The ureteroscope was removed and the cystoscope was repassed.  The right ureteral stent was grasped with endoscopic forceps and brought out through the urethral meatus.  The stent was cannulated with the Sensor wire in a similar fashion and the stent was removed.  The 4.5 French semirigid ureteroscope was then placed and easily advanced into the ureter alongside the guidewire.  The ureter was closely examined through the proximal ureter and no stone was identified.  Pullout retrograde pyelogram was performed after wire removal with findings as described above.  There was some persistent retention of contrast to the distal ureter.  The ureteroscope was replaced without difficulty and the mucosa was normal in appearance.  It was elected to temporarily place a right ureteral stent.  The guidewire was replaced into the lower pole moiety under fluoroscopic guidance.  A 22F/24 cm Contour ureteral stent with tether was then advanced over the wire.  Good positioning was noted proximally and distally.  The cystoscope was repassed and all stone fragments were irrigated from the bladder.  The bladder was then emptied and the procedure ended.  The patient appeared to tolerate the procedure well and without complications.  After anesthetic reversal the patient was transported to the PACU in stable condition.   Plan: KUB and cystoscopy with left ureteral stent removal 7-10 days He was instructed to remove his right ureteral stent via the tether on  Thursday, 03/29/2022   John Giovanni, MD

## 2022-03-27 NOTE — Anesthesia Postprocedure Evaluation (Signed)
Anesthesia Post Note  Patient: Wayne Burns  Procedure(s) Performed: CYSTOSCOPY/URETEROSCOPY/HOLMIUM LASER/STENT PLACEMENT (Bilateral)  Patient location during evaluation: PACU Anesthesia Type: General Level of consciousness: awake and alert, oriented and patient cooperative Pain management: pain level controlled Vital Signs Assessment: post-procedure vital signs reviewed and stable Respiratory status: spontaneous breathing, nonlabored ventilation and respiratory function stable Cardiovascular status: blood pressure returned to baseline and stable Postop Assessment: adequate PO intake Anesthetic complications: no   No notable events documented.   Last Vitals:  Vitals:   03/27/22 1100 03/27/22 1122  BP: 122/85 133/82  Pulse: 88 88  Resp: 18 16  Temp: (!) 36.3 C (!) 36.1 C  SpO2: 100% 99%    Last Pain:  Vitals:   03/27/22 1122  TempSrc: Temporal  PainSc: 0-No pain                 Darrin Nipper

## 2022-03-27 NOTE — Discharge Instructions (Addendum)
DISCHARGE INSTRUCTIONS FOR KIDNEY STONE/URETERAL STENT   MEDICATIONS:  1. Resume all your other meds from home.  2.  AZO (over-the-counter) can help with the burning/stinging when you urinate. 3.  Oxybutynin is for bladder spasm/irritation Rx was sent to your pharmacy.  ACTIVITY:  1. May resume regular activities in 24 hours. 2. No driving while on narcotic pain medications  3. Drink plenty of water  4. Continue to walk at home - you can still get blood clots when you are at home, so keep active, but don't over do it.  5. May return to work/school tomorrow or when you feel ready   BATHING:  1. You can shower. 2. You have a string coming from your urethra: The stent string is attached to your ureteral stent. Do not pull on this.   SIGNS/SYMPTOMS TO CALL:  Common postoperative symptoms include urinary frequency, urgency, bladder spasm and blood in the urine  Please call us if you have a fever greater than 101.5, uncontrolled nausea/vomiting, uncontrolled pain, dizziness, unable to urinate, excessively bloody urine, chest pain, shortness of breath, leg swelling, leg pain, or any other concerns or questions.   You can reach [place] at [phone].   FOLLOW-UP:  1. You we will be contacted for an appointment for removal of your left stent in approximately 1 week 2. You have a string attached to your right stent, you may remove it on Thursday, 03/29/2022. To do this, pull the string until the stent is completely removed. You may feel an odd sensation in your back. AMBULATORY SURGERY  DISCHARGE INSTRUCTIONS   The drugs that you were given will stay in your system until tomorrow so for the next 24 hours you should not:  Drive an automobile Make any legal decisions Drink any alcoholic beverage   You may resume regular meals tomorrow.  Today it is better to start with liquids and gradually work up to solid foods.  You may eat anything you prefer, but it is better to start with liquids, then  soup and crackers, and gradually work up to solid foods.   Please notify your doctor immediately if you have any unusual bleeding, trouble breathing, redness and pain at the surgery site, drainage, fever, or pain not relieved by medication.    Additional Instructions:  Drink plenty of water   Please contact your physician with any problems or Same Day Surgery at [phone], Monday through Friday 6 am to 4 pm, or [place] at [facility] number at [phone].

## 2022-03-27 NOTE — Interval H&P Note (Signed)
History and Physical Interval Note:  03/27/2022 8:27 AM  Wayne Burns  has presented today for surgery, with the diagnosis of Bilateral Ureteral Stones.  The various methods of treatment have been discussed with the patient and family. After consideration of risks, benefits and other options for treatment, the patient has consented to  Procedure(s): CYSTOSCOPY/URETEROSCOPY/HOLMIUM LASER/STENT PLACEMENT (Bilateral) as a surgical intervention.  The patient's history has been reviewed, patient examined, no change in status, stable for surgery.  I have reviewed the patient's chart and labs.  Questions were answered to the patient's satisfaction.     [place]

## 2022-03-30 ENCOUNTER — Other Ambulatory Visit: Payer: Self-pay

## 2022-03-30 DIAGNOSIS — N201 Calculus of ureter: Secondary | ICD-10-CM

## 2022-04-04 LAB — CALCULI, WITH PHOTOGRAPH (CLINICAL LAB)
Calcium Oxalate Dihydrate: 20 %
Calcium Oxalate Monohydrate: 70 %
Hydroxyapatite: 10 %
Weight Calculi: 12 mg

## 2022-04-05 ENCOUNTER — Ambulatory Visit (INDEPENDENT_AMBULATORY_CARE_PROVIDER_SITE_OTHER): Payer: Medicare Other | Admitting: Urology

## 2022-04-05 ENCOUNTER — Encounter: Payer: Self-pay | Admitting: Urology

## 2022-04-05 VITALS — BP 147/86 | HR 86 | Ht 68.0 in | Wt 200.0 lb

## 2022-04-05 DIAGNOSIS — N201 Calculus of ureter: Secondary | ICD-10-CM

## 2022-04-08 NOTE — Progress Notes (Signed)
Patient presented today for stent removal.  He did not get his KUB prior to check-in and was sent for KUB prior to stent removal.  Radiology was backed up and his transportation had to leave.  KUB and stent removal appointment was rescheduled.

## 2022-04-10 ENCOUNTER — Ambulatory Visit: Admit: 2022-04-10 | Payer: MEDICARE | Attending: Urology | Primary: Urology

## 2022-04-12 ENCOUNTER — Ambulatory Visit
Admission: RE | Admit: 2022-04-12 | Discharge: 2022-04-12 | Disposition: A | Discharge status: Home / Self Care | Payer: Medicare Other | Source: Ambulatory Visit | Attending: Urology | Admitting: Urology

## 2022-04-12 ENCOUNTER — Ambulatory Visit (INDEPENDENT_AMBULATORY_CARE_PROVIDER_SITE_OTHER): Payer: Medicare Other | Admitting: Urology

## 2022-04-12 ENCOUNTER — Ambulatory Visit
Admission: RE | Admit: 2022-04-12 | Discharge: 2022-04-12 | Disposition: A | Discharge status: Home / Self Care | Payer: Medicare Other | Attending: Urology | Admitting: Urology

## 2022-04-12 ENCOUNTER — Encounter: Payer: Self-pay | Admitting: Urology

## 2022-04-12 VITALS — BP 96/61 | HR 83 | Ht 70.0 in | Wt 192.0 lb

## 2022-04-12 DIAGNOSIS — N201 Calculus of ureter: Secondary | ICD-10-CM

## 2022-04-12 LAB — MICROSCOPIC EXAMINATION: WBC, UA: >30 /hpf — AB (ref 0–5)

## 2022-04-12 LAB — URINALYSIS, COMPLETE
Bilirubin, UA: NEGATIVE
Glucose, UA: NEGATIVE
Ketones, UA: NEGATIVE
Nitrite, UA: NEGATIVE
Specific Gravity, UA: 1.015 (ref 1.005–1.030)
Urobilinogen, Ur: 0.2 mg/dL (ref 0.2–1.0)
pH, UA: 6 (ref 5.0–7.5)

## 2022-04-12 MED ORDER — SULFAMETHOXAZOLE-TRIMETHOPRIM 800-160 MG PO TABS
1.0000 | ORAL_TABLET | Freq: Once | ORAL | Status: AC
  Administered 2022-04-12: 1 via ORAL

## 2022-04-12 NOTE — Progress Notes (Signed)
Indications: Patient is 66 y.o., who is s/p ureteroscopic removal of bilateral ureteral and left renal calculi 03/27/2022.  Right ureteral stent was left on a tether which he removed 2 days postop.  The patient is presenting today for left stent removal.  KUB performed this morning shows minute stone fragments lower pole  Procedure:  Flexible Cystoscopy with stent removal ([id number])  Timeout was performed and the correct patient, procedure and participants were identified.    Description:  The patient was prepped and draped in the usual sterile fashion. Flexible cystosopy was performed.  The stent was visualized, grasped, and removed intact without difficulty. The patient tolerated the procedure well.  A single dose of oral antibiotics was given.  Complications:  None  Plan:  Instructed to call for fever or flank pain post stent removal Follow-up 6 months with KUB Stone analysis CaOxMono/CaOxDi/hydroxyapatite 12/45/80 Declined metabolic evaluation.  General stone prevention guidelines were discussed   John Giovanni, MD

## 2022-05-01 ENCOUNTER — Ambulatory Visit: Admit: 2022-05-01 | Discharge: 2022-05-02 | Payer: MEDICARE

## 2022-05-09 ENCOUNTER — Ambulatory Visit: Admit: 2022-05-09 | Discharge: 2022-05-09 | Payer: MEDICARE

## 2022-05-30 ENCOUNTER — Ambulatory Visit: Admit: 2022-05-30 | Discharge: 2022-05-31 | Payer: MEDICARE | Attending: Adult Health | Primary: Adult Health

## 2022-05-30 DIAGNOSIS — I5020 Unspecified systolic (congestive) heart failure: Principal | ICD-10-CM

## 2022-05-30 DIAGNOSIS — M86471 Chronic osteomyelitis with draining sinus, right ankle and foot: Principal | ICD-10-CM

## 2022-05-30 DIAGNOSIS — I2510 Atherosclerotic heart disease of native coronary artery without angina pectoris: Principal | ICD-10-CM

## 2022-05-30 MED ORDER — METOPROLOL SUCCINATE ER 25 MG TABLET,EXTENDED RELEASE 24 HR
ORAL_TABLET | Freq: Every day | ORAL | 3 refills | 90 days | Status: CP
Start: 2022-05-30 — End: 2023-05-30

## 2022-05-30 MED ORDER — ENTRESTO 24 MG-26 MG TABLET
ORAL_TABLET | Freq: Two times a day (BID) | ORAL | 1 refills | 90 days | Status: CP
Start: 2022-05-30 — End: ?

## 2022-06-13 ENCOUNTER — Institutional Professional Consult (permissible substitution): Admit: 2022-06-13 | Discharge: 2022-06-14 | Payer: MEDICARE

## 2022-06-13 DIAGNOSIS — I5020 Unspecified systolic (congestive) heart failure: Principal | ICD-10-CM

## 2022-06-27 ENCOUNTER — Institutional Professional Consult (permissible substitution): Admit: 2022-06-27 | Discharge: 2022-06-28 | Payer: MEDICARE

## 2022-06-27 MED ORDER — METOPROLOL SUCCINATE ER 50 MG TABLET,EXTENDED RELEASE 24 HR
ORAL_TABLET | Freq: Every day | ORAL | 3 refills | 90 days | Status: CP
Start: 2022-06-27 — End: 2023-06-22

## 2022-07-11 ENCOUNTER — Ambulatory Visit: Admit: 2022-07-11 | Discharge: 2022-07-11 | Payer: MEDICARE | Attending: Vascular Surgery | Primary: Vascular Surgery

## 2022-07-11 DIAGNOSIS — M86471 Chronic osteomyelitis with draining sinus, right ankle and foot: Principal | ICD-10-CM

## 2022-07-12 ENCOUNTER — Institutional Professional Consult (permissible substitution): Admit: 2022-07-12 | Discharge: 2022-07-13 | Payer: MEDICARE

## 2022-07-18 ENCOUNTER — Ambulatory Visit: Admit: 2022-07-18 | Discharge: 2022-07-19 | Payer: MEDICARE

## 2022-07-18 DIAGNOSIS — R799 Abnormal finding of blood chemistry, unspecified: Principal | ICD-10-CM

## 2022-07-18 DIAGNOSIS — I5021 Acute systolic (congestive) heart failure: Principal | ICD-10-CM

## 2022-07-18 DIAGNOSIS — L97919 Non-pressure chronic ulcer of unspecified part of right lower leg with unspecified severity: Principal | ICD-10-CM

## 2022-07-18 MED ORDER — EMPAGLIFLOZIN 10 MG TABLET
ORAL_TABLET | Freq: Every day | ORAL | 3 refills | 90 days
Start: 2022-07-18 — End: ?

## 2022-07-18 MED ORDER — DAPAGLIFLOZIN PROPANEDIOL 10 MG TABLET
ORAL_TABLET | Freq: Every morning | ORAL | 3 refills | 90 days
Start: 2022-07-18 — End: ?

## 2022-07-19 MED ORDER — DAPAGLIFLOZIN PROPANEDIOL 5 MG TABLET
ORAL_TABLET | Freq: Every morning | ORAL | 3 refills | 90 days | Status: CP
Start: 2022-07-19 — End: ?

## 2022-08-01 ENCOUNTER — Ambulatory Visit: Admit: 2022-08-01 | Discharge: 2022-08-02 | Payer: MEDICARE

## 2022-08-01 DIAGNOSIS — I5021 Acute systolic (congestive) heart failure: Principal | ICD-10-CM

## 2022-08-01 MED ORDER — DAPAGLIFLOZIN PROPANEDIOL 10 MG TABLET
ORAL_TABLET | Freq: Every morning | ORAL | 1 refills | 90 days | Status: CP
Start: 2022-08-01 — End: ?

## 2022-08-15 ENCOUNTER — Ambulatory Visit: Admit: 2022-08-15 | Discharge: 2022-08-16 | Payer: MEDICARE

## 2022-08-20 ENCOUNTER — Ambulatory Visit: Admit: 2022-08-20 | Discharge: 2022-08-21 | Payer: MEDICARE

## 2022-08-20 DIAGNOSIS — I5021 Acute systolic (congestive) heart failure: Principal | ICD-10-CM

## 2022-08-20 DIAGNOSIS — N179 Acute kidney failure, unspecified: Principal | ICD-10-CM

## 2022-08-20 MED ORDER — SPIRONOLACTONE 25 MG TABLET
ORAL_TABLET | Freq: Every day | ORAL | 2 refills | 90 days | Status: CP
Start: 2022-08-20 — End: 2022-08-20

## 2022-09-17 ENCOUNTER — Ambulatory Visit (INDEPENDENT_AMBULATORY_CARE_PROVIDER_SITE_OTHER): Payer: Medicare Other | Admitting: Urology

## 2022-09-17 ENCOUNTER — Encounter: Payer: Self-pay | Admitting: Urology

## 2022-09-17 VITALS — BP 130/80 | HR 74 | Ht 70.0 in | Wt 195.0 lb

## 2022-09-17 DIAGNOSIS — N433 Hydrocele, unspecified: Secondary | ICD-10-CM | POA: Diagnosis not present

## 2022-09-21 ENCOUNTER — Encounter: Payer: Self-pay | Admitting: Urology

## 2022-09-21 NOTE — Progress Notes (Signed)
09/17/2022 7:01 AM   Wayne Burns [birth year] [id number]  Referring provider: Jerene Pitch, MD [address] [place],  [place] [address]  Chief Complaint  Patient presents with   Groin Pain   Urologic history: 1.  History urinary calculi  Admitted 12/12/2021 with AKI secondary to bilateral obstructing distal ureteral calculi.  Creatinine was 10.  CT showed severe right hydronephrosis with a 0.7 cm obstructing stone at the right distal ureter/UVJ. Moderate to severe left hydronephrosis with a 0.8 cm obstructing stone at the left distal ureter/UVJ. Severe bilateral periureteral/perinephric stranding with trace fluid tracking along the bilateral anterior renal fascia.  Bilateral PCN placement initially recommended.  Placement of left PCN was successful however right PCN unable to be placed secondary to patient agitation.  After PCN placement he was transferred to CCU and was ultimately intubated.  Due to critical condition additional procedures were deferred until there was clinical improvement. Underwent cystoscopy with bilateral ureteral stent placement 12/14/2013.  There was difficulty placing the right ureteral stent secondary to J hooking of the right proximal ureter after initial stent placement there was distal migration.  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  Left ureteral stent was placed without problems and left PCN was removed.  Urology plan was definitive stone treatment within the next 3 months after cardiac clearance After PCN placement ECG demonstrated new ST elevation in the inferior leads.  He was thought to have a type II event related to coronary artery disease, sepsis, renal failure and respiratory failure.  He was to follow-up with cardiology after discharge to discuss complex PCI in the near future Status post bilateral ureteroscopy 03/27/2022 with laser lithotripsy/removal of multiple left ureteral  calculi and a 5 mm lower calyceal calculus.  A lower pole calculus was unable to be treated due to limitations in scope deflection.  No right-sided calculi were identified   HPI: 66 y.o. male presents for evaluation of right hemiscrotal swelling.  Noted ~ 2 weeks ago No pain or discomfort   PMH: Past Medical History:  Diagnosis Date   Acute hypoxemic respiratory failure ([place]) 12/14/2021   a.) s/p PCN placement for obstructive uropathy; required intubation; had concurrent SARS-CoV-2 pneumonia   Acute kidney injury ([place]) 12/13/2021   Anemia    Aortic atherosclerosis ([facility])    Atrial fibrillation with RVR ([place]) 12/21/2021   a.) CHA2DS2-VASc = 3 (HFrEF, HTN, prior MI/aortic plaque). b.) rate/rhythm maintained on oral amiodarone; no chronic anticoagulation due to GIB.   COPD (chronic obstructive pulmonary disease) (HCC)    Coronary artery disease    a.) PCI with stents (unknown type) to LAD in 2006. b.) LHC 12/13/2021: 25% dLM, subtotal mLAD, chronic mLCx and pRCA; intervention deferred opting for med mgmt.   Hepatitis C    HFrEF (heart failure with reduced ejection fraction) ([place]) 12/13/2021   a.) TTE 12/13/2021: EF 25-30%; mid and basal inferior HK; G3DD. b.) TTE 01/01/2022: EF 60-65%; mild-mod MR. c.) TEE 01/02/2022: EF 50-55%; mod (grade III) aortic plaque; triv AR/PR, mild MR/TR; no IAS.   History of 2019 novel coronavirus disease (COVID-19) 12/12/2021   History of infection due to extended spectrum beta lactamase (ESBL) producing bacteria 12/26/2021   a.) noted on blood culture report: enterobaqcterales and E.coli detected   HLD (hyperlipidemia)    Hypertension    MDD (major depressive disorder)    MRSA nasal colonization 12/28/2021   Nephrolithiasis 12/13/2021   Osteomyelitis of  right ankle ([place])    a.) unable to clear infection; (+) draining sinus; orthopedics recommending BKA   Pneumonia due to COVID-19 virus 12/2021   PUD (peptic ulcer disease)    Sepsis ([place]) 12/12/2021    a.) RIGHT ankle osteomyelitis + SARS-CoV-2 + concurrent urosepsis secondary to BILATERAL obstructive UVJ stones; urine culture (+) for Proteus mirabilis and Klebsiella pneumoniae; blood cultures (+) for ESBL producing E.coli.   Splenomegaly    ST-segment elevation myocardial infarction (STEMI) of inferior wall ([place]) 12/13/2021   a.) LHC 12/13/2021: 25% dLM, subtotal mLAD, chronically occluded mLCx and pRCA; intervention was deferred with plans for poss high risk PCI as an outpatient. b.) High-sensitivity troponins were trended: 3695 --> 4498 --> 5509 --> 5580 --> 4789 --> 324 ng/L. c.) EKG changes and significantly elevated troponins likely due to CAD in the setting of sepsis, acute hypoxic respiratory failure, and ARF   Upper GI bleed 12/19/2021   a.) (+) black tarry stools; EGD showed gastric ulcers with no stigmata of bleeding    Surgical History: Past Surgical History:  Procedure Laterality Date   ANKLE HARDWARE REMOVAL  08/17/2019   I&D; long term antibiotics   ANKLE SURGERY Right 2013   ORIF   CORONARY ANGIOPLASTY  2006   LAD x 2   CYSTOSCOPY W/ URETERAL STENT PLACEMENT Right 01/02/2022   Procedure: CYSTOSCOPY WITH STENT REPLACEMENT;  Surgeon: Abbie Sons, MD;  Location: [facility];  Service: Urology;  Laterality: Right;   CYSTOSCOPY/URETEROSCOPY/HOLMIUM LASER/STENT PLACEMENT Bilateral 03/27/2022   Procedure: CYSTOSCOPY/URETEROSCOPY/HOLMIUM LASER/STENT PLACEMENT;  Surgeon: Abbie Sons, MD;  Location: [facility];  Service: Urology;  Laterality: Bilateral;   CYSTOURETHROSCOPY  12/14/2021   LEFT HEART CATH AND CORONARY ANGIOGRAPHY Left 12/13/2021   Procedure: LEFT HEART CATH AND CORONARY ANGIOGRAPHY; Location: [facility]; Surgeon: Caren Hazy, MD   PERCUTANEOUS NEPHROSTOMY Bilateral 12/13/2021   Procedure: BILATERAL PERCUTANEOUS NEPHROSTOMY TUBE PLACEMENT - ULTRASOUND AND FLUOROSCOPIC-GUIDED; Location: [facility]   TEE WITHOUT CARDIOVERSION N/A 01/02/2022   Procedure: TRANSESOPHAGEAL  ECHOCARDIOGRAM (TEE);  Surgeon: Corey Skains, MD;  Location: [facility];  Service: Cardiovascular;  Laterality: N/A;   UPPER GASTROINTESTINAL ENDOSCOPY N/A 12/21/2021    Home Medications:  Allergies as of 09/17/2022       Reactions   Ciprofloxacin Rash   Itchy rash that resolved with stopping cipro        Medication List        Accurate as of September 17, 2022 11:59 PM. If you have any questions, ask your nurse or doctor.          albuterol 108 (90 Base) MCG/ACT inhaler Commonly known as: VENTOLIN HFA 2 puffs every 4 (four) hours as needed for wheezing or shortness of breath.   amiodarone 200 MG tablet Commonly known as: PACERONE Take 1 tablet (200 mg total) by mouth daily.   aspirin EC 81 MG tablet Take 81 mg by mouth daily. Swallow whole.   buPROPion 150 MG 24 hr tablet Commonly known as: WELLBUTRIN XL bupropion HCl XL 150 mg 24 hr tablet, extended release   busPIRone 5 MG tablet Commonly known as: BUSPAR Take 5 mg by mouth daily.   lisinopril 10 MG tablet Commonly known as: ZESTRIL Take 10 mg by mouth daily.   magnesium oxide 400 MG tablet Commonly known as: MAG-OX   rosuvastatin 20 MG tablet Commonly known as: CRESTOR   sertraline 100 MG tablet Commonly known as: ZOLOFT sertraline 100 mg tablet   tamsulosin 0.4 MG Caps capsule Commonly known as:  FLOMAX Take 0.4 mg by mouth daily.        Allergies:  Allergies  Allergen Reactions   Ciprofloxacin Rash    Itchy rash that resolved with stopping cipro    Family History: History reviewed. No pertinent family history.  Social History:  reports that he has been smoking cigarettes. He has been smoking an average of .5 packs per day. He has never used smokeless tobacco. He reports that he does not currently use alcohol. He reports that he does not currently use drugs.   Physical Exam: BP 130/80   Pulse 74   Ht 5\' 10"  (1.778 m)   Wt 195 lb (88.5 kg)   BMI 27.98 kg/m   Constitutional:   Alert and oriented, No acute distress. HEENT: [place] AT Respiratory: Normal respiratory effort, no increased work of breathing. GI: Abdomen is soft, nontender, nondistended, no abdominal masses.  No inguinal hernia GU: Moderate right hydrocele.  Right testis not palpable Skin: No rashes, bruises or suspicious lesions.   Assessment & Plan:    1.  Right hydrocele Asymptomatic; right testis not palpable Schedule scrotal sonogram Options were discussed including aspiration, hydrocelectomy and observation.  He prefers observation Will call with ultrasound results   Abbie Sons, MD  [facility] [address] [place], [place] [address] [phone]

## 2022-09-24 ENCOUNTER — Ambulatory Visit: Admit: 2022-09-24 | Discharge: 2022-09-25 | Payer: MEDICARE

## 2022-09-24 DIAGNOSIS — I5021 Acute systolic (congestive) heart failure: Principal | ICD-10-CM

## 2022-09-24 MED ORDER — ROSUVASTATIN 5 MG TABLET
ORAL_TABLET | Freq: Every day | ORAL | 3 refills | 90 days | Status: CP
Start: 2022-09-24 — End: 2023-09-24

## 2022-09-25 ENCOUNTER — Ambulatory Visit
Admission: RE | Admit: 2022-09-25 | Discharge: 2022-09-25 | Disposition: A | Discharge status: Home / Self Care | Payer: Medicare Other | Source: Ambulatory Visit | Attending: Urology | Admitting: Urology

## 2022-09-25 DIAGNOSIS — N433 Hydrocele, unspecified: Secondary | ICD-10-CM | POA: Insufficient documentation

## 2022-10-03 ENCOUNTER — Ambulatory Visit: Admit: 2022-10-03 | Discharge: 2022-10-04 | Payer: MEDICARE | Attending: Adult Health | Primary: Adult Health

## 2022-10-03 ENCOUNTER — Ambulatory Visit: Admit: 2022-10-03 | Discharge: 2022-10-04 | Payer: MEDICARE

## 2022-10-03 DIAGNOSIS — I2510 Atherosclerotic heart disease of native coronary artery without angina pectoris: Principal | ICD-10-CM

## 2022-10-03 DIAGNOSIS — E785 Hyperlipidemia, unspecified: Principal | ICD-10-CM

## 2022-10-03 DIAGNOSIS — I5020 Unspecified systolic (congestive) heart failure: Principal | ICD-10-CM

## 2022-10-03 MED ORDER — ROSUVASTATIN 20 MG TABLET
ORAL_TABLET | Freq: Every day | ORAL | 3 refills | 180 days | Status: CP
Start: 2022-10-03 — End: 2023-10-03

## 2022-10-03 MED ORDER — ENTRESTO 49 MG-51 MG TABLET
ORAL_TABLET | Freq: Two times a day (BID) | ORAL | 1 refills | 90 days | Status: CP
Start: 2022-10-03 — End: ?

## 2022-10-12 ENCOUNTER — Ambulatory Visit: Payer: Medicare Other | Admitting: Urology

## 2022-10-16 ENCOUNTER — Encounter: Payer: Self-pay | Admitting: Urology

## 2022-10-31 ENCOUNTER — Ambulatory Visit: Admit: 2022-10-31 | Discharge: 2022-11-01 | Payer: MEDICARE

## 2022-10-31 DIAGNOSIS — I2510 Atherosclerotic heart disease of native coronary artery without angina pectoris: Principal | ICD-10-CM

## 2022-10-31 DIAGNOSIS — I5021 Acute systolic (congestive) heart failure: Principal | ICD-10-CM

## 2022-10-31 MED ORDER — ROSUVASTATIN 20 MG TABLET
ORAL_TABLET | Freq: Every day | ORAL | 3 refills | 90 days | Status: CP
Start: 2022-10-31 — End: 2023-10-31

## 2022-11-13 ENCOUNTER — Ambulatory Visit: Admit: 2022-11-13 | Discharge: 2022-11-14 | Payer: MEDICARE

## 2022-11-14 ENCOUNTER — Ambulatory Visit: Admit: 2022-11-14 | Discharge: 2022-11-14 | Payer: MEDICARE

## 2022-11-23 ENCOUNTER — Ambulatory Visit: Payer: Medicare Other | Admitting: Urology

## 2022-12-07 MED ORDER — TAMSULOSIN 0.4 MG CAPSULE
ORAL_CAPSULE | 2 refills | 0 days | Status: CP
Start: 2022-12-07 — End: ?

## 2022-12-11 MED ORDER — SPIRONOLACTONE 25 MG TABLET
ORAL_TABLET | 3 refills | 0 days | Status: CP
Start: 2022-12-11 — End: ?

## 2022-12-12 ENCOUNTER — Encounter: Payer: Self-pay | Admitting: Urology

## 2022-12-12 ENCOUNTER — Ambulatory Visit: Payer: Medicare Other | Admitting: Urology

## 2022-12-17 ENCOUNTER — Ambulatory Visit: Admit: 2022-12-17 | Discharge: 2022-12-18 | Payer: MEDICARE

## 2022-12-17 DIAGNOSIS — E875 Hyperkalemia: Principal | ICD-10-CM

## 2022-12-17 DIAGNOSIS — I5021 Acute systolic (congestive) heart failure: Principal | ICD-10-CM

## 2023-01-02 NOTE — Progress Notes (Addendum)
01/03/2023 12:23 PM   Wayne Burns 09/30/1956 [id number]  Referring provider: Jerene Pitch, MD [address] [place],  [place] [address]  Urological history: 1.  Urinary retention -Managed with self-catheterization  2.  Nephrolithiasis -Stone composition of 70% calcium monohydrate, 20% calcium oxalate dihydrate and 10% hydroxyapatite -Bilateral URS (03/2022)   3. Hydrocele -scrotal US (09/2022) - small left hydrocele  4.  Complicated epididymal cyst -scrotal US (09/2022) -  right epididymis is thickened and hyperemic. A mildly complicated cyst is identified in the head -Recommend repeat scrotal ultrasound in 3 months to ensure resolution  Chief Complaint  Patient presents with   Follow-up    Discuss self catheterization     HPI: Wayne Burns is a 67 y.o. male who presents today to discuss self-catheterization.  He had been followed by [facility], but I am unable to see his records on care everywhere because we do not have permission to access those records at this time.  He has been self cathing himself for a number years 2-5 times daily.  His current catheters he feels are too large for his needs.  He is unable to void spontaneously.  He brought in an old catheter for comparison.  PMH: Past Medical History:  Diagnosis Date   Acute hypoxemic respiratory failure ([place]) 12/14/2021   a.) s/p PCN placement for obstructive uropathy; required intubation; had concurrent SARS-CoV-2 pneumonia   Acute kidney injury ([place]) 12/13/2021   Anemia    Aortic atherosclerosis (HCC)    Atrial fibrillation with RVR ([place]) 12/21/2021   a.) CHA2DS2-VASc = 3 (HFrEF, HTN, prior MI/aortic plaque). b.) rate/rhythm maintained on oral amiodarone; no chronic anticoagulation due to GIB.   COPD (chronic obstructive pulmonary disease) (HCC)    Coronary artery disease    a.) PCI with stents (unknown type) to LAD in 2006. b.) LHC 12/13/2021: 25% dLM, subtotal mLAD, chronic mLCx and pRCA;  intervention deferred opting for med mgmt.   Hepatitis C    HFrEF (heart failure with reduced ejection fraction) ([place]) 12/13/2021   a.) TTE 12/13/2021: EF 25-30%; mid and basal inferior HK; G3DD. b.) TTE 01/01/2022: EF 60-65%; mild-mod MR. c.) TEE 01/02/2022: EF 50-55%; mod (grade III) aortic plaque; triv AR/PR, mild MR/TR; no IAS.   History of 2019 novel coronavirus disease (COVID-19) 12/12/2021   History of infection due to extended spectrum beta lactamase (ESBL) producing bacteria 12/26/2021   a.) noted on blood culture report: enterobaqcterales and E.coli detected   HLD (hyperlipidemia)    Hypertension    MDD (major depressive disorder)    MRSA nasal colonization 12/28/2021   Nephrolithiasis 12/13/2021   Osteomyelitis of right ankle ([place])    a.) unable to clear infection; (+) draining sinus; orthopedics recommending BKA   Pneumonia due to COVID-19 virus 12/2021   PUD (peptic ulcer disease)    Sepsis ([place]) 12/12/2021   a.) RIGHT ankle osteomyelitis + SARS-CoV-2 + concurrent urosepsis secondary to BILATERAL obstructive UVJ stones; urine culture (+) for Proteus mirabilis and Klebsiella pneumoniae; blood cultures (+) for ESBL producing E.coli.   Splenomegaly    ST-segment elevation myocardial infarction (STEMI) of inferior wall ([place]) 12/13/2021   a.) LHC 12/13/2021: 25% dLM, subtotal mLAD, chronically occluded mLCx and pRCA; intervention was deferred with plans for poss high risk PCI as an outpatient. b.) High-sensitivity troponins were trended: 3695 --> 4498 --> 5509 --> 5580 --> 4789 --> 324 ng/L. c.) EKG changes and significantly elevated troponins likely due to CAD in the setting of  sepsis, acute hypoxic respiratory failure, and ARF   Upper GI bleed 12/19/2021   a.) (+) black tarry stools; EGD showed gastric ulcers with no stigmata of bleeding    Surgical History: Past Surgical History:  Procedure Laterality Date   ANKLE HARDWARE REMOVAL  08/17/2019   I&D; long term antibiotics    ANKLE SURGERY Right 2013   ORIF   CORONARY ANGIOPLASTY  2006   LAD x 2   CYSTOSCOPY W/ URETERAL STENT PLACEMENT Right 01/02/2022   Procedure: CYSTOSCOPY WITH STENT REPLACEMENT;  Surgeon: Wayne Sons, MD;  Location: [facility];  Service: Urology;  Laterality: Right;   CYSTOSCOPY/URETEROSCOPY/HOLMIUM LASER/STENT PLACEMENT Bilateral 03/27/2022   Procedure: CYSTOSCOPY/URETEROSCOPY/HOLMIUM LASER/STENT PLACEMENT;  Surgeon: Wayne Sons, MD;  Location: [facility];  Service: Urology;  Laterality: Bilateral;   CYSTOURETHROSCOPY  12/14/2021   LEFT HEART CATH AND CORONARY ANGIOGRAPHY Left 12/13/2021   Procedure: LEFT HEART CATH AND CORONARY ANGIOGRAPHY; Location: [facility]; Surgeon: Wayne Hazy, MD   PERCUTANEOUS NEPHROSTOMY Bilateral 12/13/2021   Procedure: BILATERAL PERCUTANEOUS NEPHROSTOMY TUBE PLACEMENT - ULTRASOUND AND FLUOROSCOPIC-GUIDED; Location: [facility]   TEE WITHOUT CARDIOVERSION N/A 01/02/2022   Procedure: TRANSESOPHAGEAL ECHOCARDIOGRAM (TEE);  Surgeon: Wayne Skains, MD;  Location: [facility];  Service: Cardiovascular;  Laterality: N/A;   UPPER GASTROINTESTINAL ENDOSCOPY N/A 12/21/2021    Home Medications:  Allergies as of 01/03/2023       Reactions   Ciprofloxacin Rash   Itchy rash that resolved with stopping cipro        Medication List        Accurate as of January 03, 2023 12:23 PM. If you have any questions, ask your nurse or doctor.          STOP taking these medications    albuterol 108 (90 Base) MCG/ACT inhaler Commonly known as: VENTOLIN HFA Stopped by: Wayne Balik, PA-C   magnesium oxide 400 MG tablet Commonly known as: MAG-OX Stopped by: Wayne Croft, PA-C   sertraline 100 MG tablet Commonly known as: ZOLOFT Stopped by: Wayne Nolden, PA-C       TAKE these medications    amiodarone 200 MG tablet Commonly known as: PACERONE Take 1 tablet (200 mg total) by mouth daily.   aspirin EC 81 MG tablet Take 81 mg by mouth daily. Swallow whole.    buPROPion 150 MG 24 hr tablet Commonly known as: WELLBUTRIN XL bupropion HCl XL 150 mg 24 hr tablet, extended release   busPIRone 5 MG tablet Commonly known as: BUSPAR Take 5 mg by mouth daily.   lisinopril 10 MG tablet Commonly known as: ZESTRIL Take 10 mg by mouth daily.   rosuvastatin 20 MG tablet Commonly known as: CRESTOR   tamsulosin 0.4 MG Caps capsule Commonly known as: FLOMAX Take 0.4 mg by mouth daily.        Allergies:  Allergies  Allergen Reactions   Ciprofloxacin Rash    Itchy rash that resolved with stopping cipro    Family History: No family history on file.  Social History:  reports that he has been smoking cigarettes. He has been smoking an average of .5 packs per day. He has been exposed to tobacco smoke. He has never used smokeless tobacco. He reports that he does not currently use alcohol. He reports that he does not currently use drugs.  ROS: Pertinent ROS in HPI  Physical Exam: BP 130/87   Pulse 70   Ht '5\' 9"'$  (1.753 m)   Wt 175 lb (79.4 kg)   BMI 25.84 kg/m  Constitutional:  Well nourished. Alert and oriented, No acute distress. HEENT: [place] AT, moist mucus membranes.  Trachea midline, no masses. Cardiovascular: No clubbing, cyanosis, or edema. Respiratory: Normal respiratory effort, no increased work of breathing. Neurologic: Grossly intact, no focal deficits, moving all 4 extremities. Psychiatric: Normal mood and affect.  Laboratory Data: N/A  Pertinent Imaging: N/A   Assessment & Plan:    1.  Urinary retention -I brought several samples of self catheters into the room and he identified that he uses a [address] [place] coud male [place] cath due to BPH -Will send a prescription in for his catheters and he wants it sent to our office rather than his group home -  pt cath 5 times daily, with coude unable to pass straight tip     2.  Complicated right epididymal cyst -Advised patient that we recommend repeating the scrotal ultrasound at  this time to ensure the right epididymal issues have resolved.  The most important thing is to ensure there is no testicular or epididymal malignancy. -He preferred to be called with results  Return for I will call patient with results.  These notes generated with voice recognition software. I apologize for typographical errors.  Cloretta Ned  [facility] [address]  [address] [place], [place] [address] [phone]  I spent 30 minutes on the day of the encounter to include pre-visit record review, face-to-face time with the patient, and post-visit ordering of tests.

## 2023-01-03 ENCOUNTER — Ambulatory Visit (INDEPENDENT_AMBULATORY_CARE_PROVIDER_SITE_OTHER): Payer: 59 | Admitting: Urology

## 2023-01-03 ENCOUNTER — Encounter: Payer: Self-pay | Admitting: Urology

## 2023-01-03 VITALS — BP 130/87 | HR 70 | Ht 69.0 in | Wt 175.0 lb

## 2023-01-03 DIAGNOSIS — R339 Retention of urine, unspecified: Secondary | ICD-10-CM | POA: Diagnosis not present

## 2023-01-03 DIAGNOSIS — N503 Cyst of epididymis: Secondary | ICD-10-CM | POA: Diagnosis not present

## 2023-01-04 ENCOUNTER — Telehealth: Payer: Self-pay | Admitting: *Deleted

## 2023-01-04 NOTE — Telephone Encounter (Signed)
Patient came in office and picked up catheters. It was only a partial order and the rest should be coming.

## 2023-01-04 NOTE — Telephone Encounter (Signed)
Received catheters from [facility] for patient to pick-up in office.  Marland Kitchenleft message to have patient return my call.   ( Catheters are in a brown box under my desk)

## 2023-01-09 ENCOUNTER — Ambulatory Visit: Admit: 2023-01-09 | Discharge: 2023-01-10 | Payer: MEDICARE | Attending: Adult Health | Primary: Adult Health

## 2023-01-09 DIAGNOSIS — E785 Hyperlipidemia, unspecified: Principal | ICD-10-CM

## 2023-01-09 DIAGNOSIS — I2510 Atherosclerotic heart disease of native coronary artery without angina pectoris: Principal | ICD-10-CM

## 2023-01-09 DIAGNOSIS — I4891 Unspecified atrial fibrillation: Principal | ICD-10-CM

## 2023-01-09 DIAGNOSIS — E875 Hyperkalemia: Principal | ICD-10-CM

## 2023-01-09 DIAGNOSIS — I5020 Unspecified systolic (congestive) heart failure: Principal | ICD-10-CM

## 2023-01-09 MED ORDER — SODIUM ZIRCONIUM CYCLOSILICATE 10 GRAM ORAL POWDER PACKET
PACK | Freq: Every day | ORAL | 30 refills | 1 days | Status: CP
Start: 2023-01-09 — End: ?

## 2023-01-10 MED ORDER — SPIRONOLACTONE 25 MG TABLET
ORAL_TABLET | Freq: Every day | ORAL | 3 refills | 90 days | Status: CP
Start: 2023-01-10 — End: ?

## 2023-01-16 ENCOUNTER — Telehealth: Payer: Self-pay | Admitting: Urology

## 2023-01-16 NOTE — Telephone Encounter (Signed)
[facility] sent patient samples of 30 catheters that he received on 01/04/23. I called and spoke with [facility] [employer] and found out that they were not able to send the order for catheters for the patient yet because they need more information from [place] and order sent to them. All of the information needed will be faxed to [place] along with a form. [facility] [employer] representative stated fax was sent to [place] on 01/14/23 but when I confirmed our fax number found out that it was not ours, this has been updated with them and will hopefully received their fax later today or tomorrow. Patient was updated on the status with this.

## 2023-01-16 NOTE — Telephone Encounter (Signed)
Patient called to check status of catheter order. He said that company (he thinks it is [employer]) told him they faxed something to [place] and have not heard back. He provided a contact phone number for company; [phone]. He would like an update on when he will receive catheters.

## 2023-01-30 ENCOUNTER — Ambulatory Visit: Admit: 2023-01-30 | Payer: MEDICARE

## 2023-02-04 DEATH — deceased

## 2023-02-14 NOTE — Telephone Encounter (Signed)
Need addendum to note, needs to say pt cath 5 times daily, with coude unable to pass straight tip and then they will approve cath per medicare guidelines.

## 2023-02-15 DIAGNOSIS — Z1211 Encounter for screening for malignant neoplasm of colon: Principal | ICD-10-CM

## 2023-02-15 DIAGNOSIS — Z1212 Encounter for screening for malignant neoplasm of rectum: Principal | ICD-10-CM

## 2023-02-15 NOTE — Telephone Encounter (Signed)
Addended note faxed to [employer]

## 2023-02-18 ENCOUNTER — Telehealth: Payer: Self-pay | Admitting: Urology

## 2023-02-18 NOTE — Telephone Encounter (Signed)
Would you please call Wayne Burns and get him scheduled for his scrotal ultrasound.  He has not been scheduled yet.

## 2023-03-10 ENCOUNTER — Encounter: Payer: Self-pay | Admitting: Urology
